# Patient Record
Sex: Female | Born: 1950
Health system: Southern US, Community
[De-identification: ages and names within clinical notes are randomized; demographics above are authoritative.]

## PROBLEM LIST (undated history)

## (undated) DIAGNOSIS — R634 Abnormal weight loss: Secondary | ICD-10-CM

## (undated) DIAGNOSIS — R0602 Shortness of breath: Secondary | ICD-10-CM

## (undated) DIAGNOSIS — I2510 Atherosclerotic heart disease of native coronary artery without angina pectoris: Secondary | ICD-10-CM

## (undated) DIAGNOSIS — K279 Peptic ulcer, site unspecified, unspecified as acute or chronic, without hemorrhage or perforation: Secondary | ICD-10-CM

## (undated) DIAGNOSIS — R112 Nausea with vomiting, unspecified: Secondary | ICD-10-CM

## (undated) DIAGNOSIS — E785 Hyperlipidemia, unspecified: Secondary | ICD-10-CM

## (undated) DIAGNOSIS — R9430 Abnormal result of cardiovascular function study, unspecified: Secondary | ICD-10-CM

## (undated) DIAGNOSIS — K449 Diaphragmatic hernia without obstruction or gangrene: Secondary | ICD-10-CM

## (undated) DIAGNOSIS — Z90710 Acquired absence of both cervix and uterus: Secondary | ICD-10-CM

## (undated) DIAGNOSIS — J8410 Pulmonary fibrosis, unspecified: Secondary | ICD-10-CM

## (undated) DIAGNOSIS — I10 Essential (primary) hypertension: Secondary | ICD-10-CM

## (undated) DIAGNOSIS — K635 Polyp of colon: Secondary | ICD-10-CM

## (undated) DIAGNOSIS — K219 Gastro-esophageal reflux disease without esophagitis: Secondary | ICD-10-CM

## (undated) DIAGNOSIS — R001 Bradycardia, unspecified: Secondary | ICD-10-CM

## (undated) DIAGNOSIS — R002 Palpitations: Secondary | ICD-10-CM

## (undated) DIAGNOSIS — Z21 Asymptomatic human immunodeficiency virus [HIV] infection status: Secondary | ICD-10-CM

## (undated) HISTORY — DX: Atherosclerotic heart disease of native coronary artery without angina pectoris: I25.10

## (undated) HISTORY — DX: Polyp of colon: K63.5

## (undated) HISTORY — DX: Acquired absence of both cervix and uterus: Z90.710

## (undated) HISTORY — DX: Asymptomatic human immunodeficiency virus (hiv) infection status: Z21

## (undated) HISTORY — DX: Shortness of breath: R06.02

## (undated) HISTORY — DX: Palpitations: R00.2

## (undated) HISTORY — PX: VENTRAL HERNIA REPAIR: SHX424

## (undated) HISTORY — PX: GASTRECTOMY: SHX58

## (undated) HISTORY — DX: Abnormal result of cardiovascular function study, unspecified: R94.30

## (undated) HISTORY — PX: CARDIAC CATHETERIZATION: SHX172

## (undated) HISTORY — DX: Hyperlipidemia, unspecified: E78.5

## (undated) HISTORY — DX: Abnormal weight loss: R63.4

## (undated) HISTORY — DX: Bradycardia, unspecified: R00.1

## (undated) HISTORY — DX: Peptic ulcer, site unspecified, unspecified as acute or chronic, without hemorrhage or perforation: K27.9

## (undated) HISTORY — PX: CHOLECYSTECTOMY: SHX55

## (undated) HISTORY — PX: CORONARY STENT PLACEMENT: SHX1402

## (undated) HISTORY — DX: Gastro-esophageal reflux disease without esophagitis: K21.9

## (undated) HISTORY — PX: APPENDECTOMY: SHX54

## (undated) HISTORY — PX: ABDOMINAL HYSTERECTOMY: SHX81

---
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
## 1996-12-18 ENCOUNTER — Encounter: Payer: Self-pay | Admitting: Internal Medicine

## 1997-07-21 ENCOUNTER — Other Ambulatory Visit: Admission: RE | Admit: 1997-07-21 | Discharge: 1997-07-21 | Payer: Self-pay | Admitting: Family Medicine

## 1997-07-22 ENCOUNTER — Other Ambulatory Visit: Admission: RE | Admit: 1997-07-22 | Discharge: 1997-07-22 | Payer: Self-pay | Admitting: Family Medicine

## 1997-07-23 ENCOUNTER — Other Ambulatory Visit: Admission: RE | Admit: 1997-07-23 | Discharge: 1997-07-23 | Payer: Self-pay | Admitting: Obstetrics

## 1997-07-23 ENCOUNTER — Encounter: Admission: RE | Admit: 1997-07-23 | Discharge: 1997-07-23 | Payer: Self-pay | Admitting: Obstetrics

## 1997-07-24 ENCOUNTER — Ambulatory Visit: Admission: RE | Admit: 1997-07-24 | Discharge: 1997-07-24 | Payer: Self-pay | Admitting: Family Medicine

## 1997-08-17 ENCOUNTER — Ambulatory Visit (HOSPITAL_COMMUNITY): Admission: RE | Admit: 1997-08-17 | Discharge: 1997-08-17 | Payer: Self-pay | Admitting: Family Medicine

## 1997-09-22 ENCOUNTER — Other Ambulatory Visit: Admission: RE | Admit: 1997-09-22 | Discharge: 1997-09-22 | Payer: Self-pay | Admitting: Family Medicine

## 1997-11-24 ENCOUNTER — Encounter: Payer: Self-pay | Admitting: Family Medicine

## 1997-11-24 ENCOUNTER — Ambulatory Visit (HOSPITAL_COMMUNITY): Admission: RE | Admit: 1997-11-24 | Discharge: 1997-11-24 | Payer: Self-pay | Admitting: Family Medicine

## 1997-11-24 ENCOUNTER — Other Ambulatory Visit: Admission: RE | Admit: 1997-11-24 | Discharge: 1997-11-24 | Payer: Self-pay | Admitting: Family Medicine

## 1998-01-29 ENCOUNTER — Other Ambulatory Visit: Admission: RE | Admit: 1998-01-29 | Discharge: 1998-01-29 | Payer: Self-pay | Admitting: Obstetrics and Gynecology

## 1998-02-18 ENCOUNTER — Ambulatory Visit (HOSPITAL_COMMUNITY): Admission: RE | Admit: 1998-02-18 | Discharge: 1998-02-18 | Payer: Self-pay | Admitting: Obstetrics and Gynecology

## 1998-05-20 ENCOUNTER — Ambulatory Visit (HOSPITAL_COMMUNITY): Admission: RE | Admit: 1998-05-20 | Discharge: 1998-05-20 | Payer: Self-pay | Admitting: Family Medicine

## 1999-04-27 ENCOUNTER — Ambulatory Visit (HOSPITAL_COMMUNITY): Admission: RE | Admit: 1999-04-27 | Discharge: 1999-04-27 | Payer: Self-pay | Admitting: Surgery

## 1999-04-27 ENCOUNTER — Encounter: Payer: Self-pay | Admitting: Surgery

## 1999-05-19 ENCOUNTER — Inpatient Hospital Stay (HOSPITAL_COMMUNITY): Admission: EM | Admit: 1999-05-19 | Discharge: 1999-05-22 | Payer: Self-pay | Admitting: Emergency Medicine

## 1999-05-19 ENCOUNTER — Encounter: Payer: Self-pay | Admitting: Emergency Medicine

## 1999-05-21 ENCOUNTER — Encounter: Payer: Self-pay | Admitting: Internal Medicine

## 1999-06-21 ENCOUNTER — Ambulatory Visit (HOSPITAL_COMMUNITY): Admission: RE | Admit: 1999-06-21 | Discharge: 1999-06-21 | Payer: Self-pay | Admitting: Orthopaedic Surgery

## 1999-09-16 ENCOUNTER — Ambulatory Visit (HOSPITAL_COMMUNITY): Admission: RE | Admit: 1999-09-16 | Discharge: 1999-09-16 | Payer: Self-pay | Admitting: *Deleted

## 2000-02-28 ENCOUNTER — Ambulatory Visit (HOSPITAL_COMMUNITY): Admission: RE | Admit: 2000-02-28 | Discharge: 2000-02-28 | Payer: Self-pay | Admitting: Family Medicine

## 2000-02-28 ENCOUNTER — Encounter: Payer: Self-pay | Admitting: Family Medicine

## 2000-06-22 ENCOUNTER — Ambulatory Visit (HOSPITAL_COMMUNITY): Admission: RE | Admit: 2000-06-22 | Discharge: 2000-06-22 | Payer: Self-pay | Admitting: Family Medicine

## 2000-06-22 ENCOUNTER — Encounter: Payer: Self-pay | Admitting: Family Medicine

## 2000-08-14 ENCOUNTER — Encounter: Payer: Self-pay | Admitting: Family Medicine

## 2000-08-14 ENCOUNTER — Ambulatory Visit (HOSPITAL_COMMUNITY): Admission: RE | Admit: 2000-08-14 | Discharge: 2000-08-14 | Payer: Self-pay | Admitting: Family Medicine

## 2000-11-20 ENCOUNTER — Ambulatory Visit (HOSPITAL_COMMUNITY): Admission: RE | Admit: 2000-11-20 | Discharge: 2000-11-20 | Payer: Self-pay | Admitting: Internal Medicine

## 2000-11-20 ENCOUNTER — Encounter: Payer: Self-pay | Admitting: Internal Medicine

## 2000-12-03 ENCOUNTER — Encounter: Payer: Self-pay | Admitting: Family Medicine

## 2000-12-03 ENCOUNTER — Ambulatory Visit (HOSPITAL_COMMUNITY): Admission: RE | Admit: 2000-12-03 | Discharge: 2000-12-03 | Payer: Self-pay | Admitting: Family Medicine

## 2001-01-07 ENCOUNTER — Encounter: Payer: Self-pay | Admitting: Internal Medicine

## 2001-01-07 ENCOUNTER — Encounter: Admission: RE | Admit: 2001-01-07 | Discharge: 2001-01-07 | Payer: Self-pay | Admitting: Internal Medicine

## 2001-03-05 ENCOUNTER — Encounter: Payer: Self-pay | Admitting: Internal Medicine

## 2001-03-05 ENCOUNTER — Ambulatory Visit (HOSPITAL_COMMUNITY): Admission: RE | Admit: 2001-03-05 | Discharge: 2001-03-05 | Payer: Self-pay | Admitting: Internal Medicine

## 2001-06-10 ENCOUNTER — Encounter (INDEPENDENT_AMBULATORY_CARE_PROVIDER_SITE_OTHER): Payer: Self-pay | Admitting: Specialist

## 2001-06-10 ENCOUNTER — Ambulatory Visit (HOSPITAL_COMMUNITY): Admission: RE | Admit: 2001-06-10 | Discharge: 2001-06-10 | Payer: Self-pay | Admitting: Internal Medicine

## 2001-06-10 ENCOUNTER — Encounter: Payer: Self-pay | Admitting: Internal Medicine

## 2001-06-24 ENCOUNTER — Ambulatory Visit (HOSPITAL_COMMUNITY): Admission: RE | Admit: 2001-06-24 | Discharge: 2001-06-24 | Payer: Self-pay | Admitting: Family Medicine

## 2001-07-26 ENCOUNTER — Ambulatory Visit (HOSPITAL_COMMUNITY): Admission: RE | Admit: 2001-07-26 | Discharge: 2001-07-26 | Payer: Self-pay | Admitting: Internal Medicine

## 2001-07-26 ENCOUNTER — Encounter: Payer: Self-pay | Admitting: Internal Medicine

## 2001-08-11 ENCOUNTER — Encounter: Payer: Self-pay | Admitting: Emergency Medicine

## 2001-08-11 ENCOUNTER — Inpatient Hospital Stay (HOSPITAL_COMMUNITY): Admission: EM | Admit: 2001-08-11 | Discharge: 2001-08-13 | Payer: Self-pay | Admitting: Emergency Medicine

## 2001-11-07 ENCOUNTER — Encounter: Payer: Self-pay | Admitting: Orthopaedic Surgery

## 2001-11-07 ENCOUNTER — Encounter: Admission: RE | Admit: 2001-11-07 | Discharge: 2001-11-07 | Payer: Self-pay | Admitting: Orthopaedic Surgery

## 2001-11-21 ENCOUNTER — Encounter: Admission: RE | Admit: 2001-11-21 | Discharge: 2001-11-21 | Payer: Self-pay | Admitting: Orthopaedic Surgery

## 2001-11-21 ENCOUNTER — Encounter: Payer: Self-pay | Admitting: Orthopaedic Surgery

## 2001-12-17 ENCOUNTER — Inpatient Hospital Stay (HOSPITAL_COMMUNITY): Admission: EM | Admit: 2001-12-17 | Discharge: 2001-12-20 | Payer: Self-pay

## 2001-12-18 ENCOUNTER — Encounter: Payer: Self-pay | Admitting: Cardiovascular Disease

## 2002-01-09 ENCOUNTER — Emergency Department (HOSPITAL_COMMUNITY): Admission: EM | Admit: 2002-01-09 | Discharge: 2002-01-09 | Payer: Self-pay | Admitting: Emergency Medicine

## 2002-01-09 ENCOUNTER — Encounter: Payer: Self-pay | Admitting: Emergency Medicine

## 2002-02-24 ENCOUNTER — Ambulatory Visit (HOSPITAL_COMMUNITY): Admission: RE | Admit: 2002-02-24 | Discharge: 2002-02-24 | Payer: Self-pay | Admitting: Internal Medicine

## 2002-03-24 ENCOUNTER — Encounter: Payer: Self-pay | Admitting: Orthopaedic Surgery

## 2002-03-24 ENCOUNTER — Encounter: Admission: RE | Admit: 2002-03-24 | Discharge: 2002-03-24 | Payer: Self-pay | Admitting: Orthopaedic Surgery

## 2002-03-30 ENCOUNTER — Encounter: Payer: Self-pay | Admitting: Emergency Medicine

## 2002-03-30 ENCOUNTER — Inpatient Hospital Stay (HOSPITAL_COMMUNITY): Admission: EM | Admit: 2002-03-30 | Discharge: 2002-04-03 | Payer: Self-pay | Admitting: Emergency Medicine

## 2002-05-16 ENCOUNTER — Ambulatory Visit (HOSPITAL_COMMUNITY): Admission: RE | Admit: 2002-05-16 | Discharge: 2002-05-16 | Payer: Self-pay | Admitting: Internal Medicine

## 2002-05-16 ENCOUNTER — Encounter: Payer: Self-pay | Admitting: Internal Medicine

## 2002-07-24 ENCOUNTER — Ambulatory Visit (HOSPITAL_COMMUNITY): Admission: RE | Admit: 2002-07-24 | Discharge: 2002-07-24 | Payer: Self-pay | Admitting: Internal Medicine

## 2003-04-07 ENCOUNTER — Ambulatory Visit (HOSPITAL_COMMUNITY): Admission: RE | Admit: 2003-04-07 | Discharge: 2003-04-07 | Payer: Self-pay | Admitting: Obstetrics and Gynecology

## 2003-05-05 ENCOUNTER — Ambulatory Visit (HOSPITAL_COMMUNITY): Admission: RE | Admit: 2003-05-05 | Discharge: 2003-05-05 | Payer: Self-pay | Admitting: Internal Medicine

## 2003-07-27 ENCOUNTER — Ambulatory Visit (HOSPITAL_COMMUNITY): Admission: RE | Admit: 2003-07-27 | Discharge: 2003-07-27 | Payer: Self-pay | Admitting: Internal Medicine

## 2003-10-23 ENCOUNTER — Inpatient Hospital Stay (HOSPITAL_COMMUNITY): Admission: EM | Admit: 2003-10-23 | Discharge: 2003-10-26 | Payer: Self-pay | Admitting: Emergency Medicine

## 2003-10-24 ENCOUNTER — Encounter: Payer: Self-pay | Admitting: Internal Medicine

## 2003-10-24 ENCOUNTER — Encounter (INDEPENDENT_AMBULATORY_CARE_PROVIDER_SITE_OTHER): Payer: Self-pay | Admitting: Specialist

## 2004-01-07 ENCOUNTER — Ambulatory Visit: Payer: Self-pay | Admitting: Family Medicine

## 2004-01-19 ENCOUNTER — Ambulatory Visit: Payer: Self-pay | Admitting: Family Medicine

## 2004-03-12 ENCOUNTER — Emergency Department (HOSPITAL_COMMUNITY): Admission: EM | Admit: 2004-03-12 | Discharge: 2004-03-12 | Payer: Self-pay | Admitting: *Deleted

## 2004-03-17 ENCOUNTER — Ambulatory Visit: Payer: Self-pay | Admitting: Family Medicine

## 2004-03-29 ENCOUNTER — Ambulatory Visit: Payer: Self-pay | Admitting: Family Medicine

## 2004-04-04 ENCOUNTER — Ambulatory Visit: Payer: Self-pay | Admitting: Family Medicine

## 2004-04-05 ENCOUNTER — Ambulatory Visit (HOSPITAL_COMMUNITY): Admission: RE | Admit: 2004-04-05 | Discharge: 2004-04-05 | Payer: Self-pay | Admitting: Internal Medicine

## 2004-04-06 ENCOUNTER — Ambulatory Visit: Payer: Self-pay | Admitting: Family Medicine

## 2004-04-14 ENCOUNTER — Ambulatory Visit: Payer: Self-pay | Admitting: Family Medicine

## 2004-06-16 ENCOUNTER — Ambulatory Visit: Payer: Self-pay | Admitting: Family Medicine

## 2004-06-17 ENCOUNTER — Ambulatory Visit (HOSPITAL_COMMUNITY): Admission: RE | Admit: 2004-06-17 | Discharge: 2004-06-17 | Payer: Self-pay | Admitting: Internal Medicine

## 2004-07-29 ENCOUNTER — Ambulatory Visit (HOSPITAL_COMMUNITY): Admission: RE | Admit: 2004-07-29 | Discharge: 2004-07-29 | Payer: Self-pay | Admitting: Family Medicine

## 2004-08-23 ENCOUNTER — Ambulatory Visit: Payer: Self-pay | Admitting: Family Medicine

## 2004-09-01 ENCOUNTER — Ambulatory Visit: Payer: Self-pay | Admitting: Family Medicine

## 2004-11-24 ENCOUNTER — Ambulatory Visit: Payer: Self-pay | Admitting: Family Medicine

## 2004-11-29 ENCOUNTER — Ambulatory Visit: Payer: Self-pay | Admitting: Nurse Practitioner

## 2004-12-09 ENCOUNTER — Ambulatory Visit (HOSPITAL_COMMUNITY): Admission: RE | Admit: 2004-12-09 | Discharge: 2004-12-09 | Payer: Self-pay | Admitting: Internal Medicine

## 2004-12-09 ENCOUNTER — Ambulatory Visit: Payer: Self-pay | Admitting: Family Medicine

## 2004-12-12 ENCOUNTER — Ambulatory Visit: Payer: Self-pay | Admitting: Family Medicine

## 2005-01-06 ENCOUNTER — Ambulatory Visit: Payer: Self-pay | Admitting: Family Medicine

## 2005-02-28 ENCOUNTER — Ambulatory Visit: Payer: Self-pay | Admitting: Family Medicine

## 2005-03-09 ENCOUNTER — Ambulatory Visit: Payer: Self-pay | Admitting: Family Medicine

## 2005-03-15 ENCOUNTER — Ambulatory Visit: Payer: Self-pay | Admitting: Family Medicine

## 2005-03-15 ENCOUNTER — Emergency Department (HOSPITAL_COMMUNITY): Admission: EM | Admit: 2005-03-15 | Discharge: 2005-03-15 | Payer: Self-pay | Admitting: Emergency Medicine

## 2005-03-16 ENCOUNTER — Emergency Department (HOSPITAL_COMMUNITY): Admission: EM | Admit: 2005-03-16 | Discharge: 2005-03-16 | Payer: Self-pay | Admitting: Emergency Medicine

## 2005-05-04 ENCOUNTER — Ambulatory Visit: Payer: Self-pay | Admitting: Family Medicine

## 2005-05-11 ENCOUNTER — Ambulatory Visit: Payer: Self-pay | Admitting: Internal Medicine

## 2005-07-24 ENCOUNTER — Ambulatory Visit: Payer: Self-pay | Admitting: Family Medicine

## 2005-07-31 ENCOUNTER — Ambulatory Visit (HOSPITAL_COMMUNITY): Admission: RE | Admit: 2005-07-31 | Discharge: 2005-07-31 | Payer: Self-pay | Admitting: Obstetrics and Gynecology

## 2005-08-09 ENCOUNTER — Ambulatory Visit: Payer: Self-pay | Admitting: Internal Medicine

## 2005-08-28 ENCOUNTER — Ambulatory Visit: Payer: Self-pay | Admitting: Internal Medicine

## 2005-10-19 ENCOUNTER — Ambulatory Visit: Payer: Self-pay | Admitting: Cardiology

## 2005-10-19 ENCOUNTER — Inpatient Hospital Stay (HOSPITAL_COMMUNITY): Admission: EM | Admit: 2005-10-19 | Discharge: 2005-10-20 | Payer: Self-pay | Admitting: Emergency Medicine

## 2005-10-19 ENCOUNTER — Ambulatory Visit: Payer: Self-pay | Admitting: Family Medicine

## 2005-10-26 ENCOUNTER — Ambulatory Visit: Payer: Self-pay | Admitting: Family Medicine

## 2005-11-06 ENCOUNTER — Ambulatory Visit: Payer: Self-pay | Admitting: Cardiology

## 2005-11-27 ENCOUNTER — Ambulatory Visit: Payer: Self-pay | Admitting: Cardiology

## 2005-12-07 ENCOUNTER — Ambulatory Visit: Payer: Self-pay | Admitting: Cardiology

## 2005-12-14 ENCOUNTER — Ambulatory Visit: Payer: Self-pay | Admitting: Family Medicine

## 2006-01-02 ENCOUNTER — Ambulatory Visit: Payer: Self-pay | Admitting: Internal Medicine

## 2006-01-04 ENCOUNTER — Ambulatory Visit: Payer: Self-pay | Admitting: Family Medicine

## 2006-01-05 ENCOUNTER — Ambulatory Visit: Payer: Self-pay | Admitting: Cardiology

## 2006-03-23 ENCOUNTER — Ambulatory Visit: Payer: Self-pay | Admitting: Internal Medicine

## 2006-03-26 ENCOUNTER — Ambulatory Visit (HOSPITAL_COMMUNITY): Admission: RE | Admit: 2006-03-26 | Discharge: 2006-03-26 | Payer: Self-pay | Admitting: Internal Medicine

## 2006-04-03 ENCOUNTER — Ambulatory Visit: Payer: Self-pay | Admitting: Internal Medicine

## 2006-06-21 ENCOUNTER — Ambulatory Visit: Payer: Self-pay | Admitting: Cardiology

## 2006-07-19 ENCOUNTER — Emergency Department (HOSPITAL_COMMUNITY): Admission: EM | Admit: 2006-07-19 | Discharge: 2006-07-19 | Payer: Self-pay | Admitting: Emergency Medicine

## 2006-07-24 ENCOUNTER — Ambulatory Visit: Payer: Self-pay | Admitting: Infectious Diseases

## 2006-07-24 ENCOUNTER — Inpatient Hospital Stay (HOSPITAL_COMMUNITY): Admission: EM | Admit: 2006-07-24 | Discharge: 2006-07-27 | Payer: Self-pay | Admitting: Emergency Medicine

## 2006-08-28 ENCOUNTER — Ambulatory Visit: Payer: Self-pay | Admitting: Internal Medicine

## 2006-11-07 ENCOUNTER — Ambulatory Visit: Payer: Self-pay | Admitting: Internal Medicine

## 2006-11-07 LAB — CONVERTED CEMR LAB
Alkaline Phosphatase: 59 units/L (ref 39–117)
Basophils Absolute: 0 10*3/uL (ref 0.0–0.1)
Basophils Relative: 1 % (ref 0–1)
Calcium: 8.8 mg/dL (ref 8.4–10.5)
Eosinophils Absolute: 0.1 10*3/uL (ref 0.0–0.7)
Glucose, Bld: 92 mg/dL (ref 70–99)
HCT: 38.9 % (ref 36.0–46.0)
HIV-1 RNA Quant, Log: 1.92 — ABNORMAL HIGH (ref <1.70)
Lymphs Abs: 1.6 10*3/uL (ref 0.7–3.3)
MCHC: 33.2 g/dL (ref 30.0–36.0)
MCV: 91.1 fL (ref 78.0–100.0)
Monocytes Relative: 15 % — ABNORMAL HIGH (ref 3–11)
Neutrophils Relative %: 49 % (ref 43–77)
Potassium: 3.6 meq/L (ref 3.5–5.3)
Sodium: 143 meq/L (ref 135–145)
Total Bilirubin: 0.3 mg/dL (ref 0.3–1.2)

## 2006-12-28 ENCOUNTER — Ambulatory Visit: Payer: Self-pay | Admitting: Cardiology

## 2007-01-02 ENCOUNTER — Ambulatory Visit: Payer: Self-pay | Admitting: Cardiology

## 2007-01-25 ENCOUNTER — Ambulatory Visit: Payer: Self-pay | Admitting: Cardiology

## 2007-02-15 ENCOUNTER — Ambulatory Visit: Payer: Self-pay | Admitting: Cardiology

## 2007-07-22 ENCOUNTER — Ambulatory Visit: Payer: Self-pay | Admitting: Internal Medicine

## 2007-07-22 ENCOUNTER — Telehealth (INDEPENDENT_AMBULATORY_CARE_PROVIDER_SITE_OTHER): Payer: Self-pay | Admitting: *Deleted

## 2007-07-22 DIAGNOSIS — K5900 Constipation, unspecified: Secondary | ICD-10-CM | POA: Insufficient documentation

## 2007-07-22 DIAGNOSIS — R634 Abnormal weight loss: Secondary | ICD-10-CM

## 2007-07-22 DIAGNOSIS — R1084 Generalized abdominal pain: Secondary | ICD-10-CM

## 2007-07-22 LAB — CONVERTED CEMR LAB
BUN: 13 mg/dL (ref 6–23)
Creatinine, Ser: 0.7 mg/dL (ref 0.4–1.2)

## 2007-07-23 ENCOUNTER — Ambulatory Visit: Payer: Self-pay | Admitting: Cardiology

## 2007-07-24 ENCOUNTER — Telehealth: Payer: Self-pay | Admitting: Internal Medicine

## 2007-08-06 ENCOUNTER — Encounter: Payer: Self-pay | Admitting: Internal Medicine

## 2007-08-06 ENCOUNTER — Ambulatory Visit (HOSPITAL_COMMUNITY): Admission: RE | Admit: 2007-08-06 | Discharge: 2007-08-06 | Payer: Self-pay | Admitting: Internal Medicine

## 2007-08-13 ENCOUNTER — Ambulatory Visit: Payer: Self-pay | Admitting: Internal Medicine

## 2007-08-22 ENCOUNTER — Ambulatory Visit: Payer: Self-pay | Admitting: Cardiology

## 2007-09-19 ENCOUNTER — Emergency Department (HOSPITAL_COMMUNITY): Admission: EM | Admit: 2007-09-19 | Discharge: 2007-09-19 | Payer: Self-pay | Admitting: Emergency Medicine

## 2007-12-11 ENCOUNTER — Ambulatory Visit: Payer: Self-pay | Admitting: Cardiology

## 2008-01-02 ENCOUNTER — Ambulatory Visit: Payer: Self-pay | Admitting: Cardiology

## 2008-04-09 ENCOUNTER — Emergency Department (HOSPITAL_COMMUNITY): Admission: EM | Admit: 2008-04-09 | Discharge: 2008-04-09 | Payer: Self-pay | Admitting: Emergency Medicine

## 2008-06-22 ENCOUNTER — Encounter: Payer: Self-pay | Admitting: Internal Medicine

## 2008-07-06 ENCOUNTER — Ambulatory Visit: Payer: Self-pay | Admitting: Cardiology

## 2008-07-06 DIAGNOSIS — J8410 Pulmonary fibrosis, unspecified: Secondary | ICD-10-CM

## 2008-09-10 ENCOUNTER — Encounter: Payer: Self-pay | Admitting: Internal Medicine

## 2008-09-23 ENCOUNTER — Encounter: Payer: Self-pay | Admitting: Internal Medicine

## 2008-11-03 ENCOUNTER — Encounter: Payer: Self-pay | Admitting: Cardiology

## 2008-12-10 ENCOUNTER — Encounter: Payer: Self-pay | Admitting: Cardiology

## 2008-12-14 ENCOUNTER — Ambulatory Visit: Payer: Self-pay | Admitting: Cardiology

## 2009-05-04 ENCOUNTER — Encounter: Payer: Self-pay | Admitting: Cardiology

## 2009-05-05 ENCOUNTER — Encounter: Payer: Self-pay | Admitting: Cardiology

## 2009-07-29 ENCOUNTER — Encounter (INDEPENDENT_AMBULATORY_CARE_PROVIDER_SITE_OTHER): Payer: Self-pay | Admitting: *Deleted

## 2009-07-29 ENCOUNTER — Emergency Department (HOSPITAL_COMMUNITY): Admission: EM | Admit: 2009-07-29 | Discharge: 2009-07-29 | Payer: Self-pay | Admitting: Emergency Medicine

## 2009-07-30 ENCOUNTER — Telehealth: Payer: Self-pay | Admitting: Internal Medicine

## 2009-12-16 ENCOUNTER — Ambulatory Visit: Payer: Self-pay | Admitting: Cardiology

## 2010-04-03 ENCOUNTER — Encounter: Payer: Self-pay | Admitting: Obstetrics and Gynecology

## 2010-04-14 NOTE — Progress Notes (Signed)
Summary: Triage   Phone Note Call from Patient Call back at Home Phone [phone]   Caller: Patient Call For: Dr. Marina Goodell Reason for Call: Talk to Nurse Summary of Call: pt. went to ER yesterday and been vomiting x4 days. Initial call taken by: Karna Christmas,  Jul 30, 2009 12:56 PM     Appended Document: Triage  Pt. says the Er. Dr. told her she needs seen by someone here as her appt.  with PCP at [facility] is not until Tuesday.   Appended Document: Triage Why? What was the ER's dx? Is there anyone here even available to see her this late on Fri? If not, and she is still having problems, she needs to call PCP, or go to urgent care or ER.  Appended Document: Triage There is no one in office this afternoon. Please read the ER. notes and Ct that is in there for 07/29/09  Appended Document: Triage Pt.aware that she is under tx. for  symptoms prescibed by Er Dr. and she needs to keep appt. at [facility] on Tuesday..If problem over the week end she needs to go to th ER.

## 2010-04-14 NOTE — Assessment & Plan Note (Signed)
Summary: f1y  Medications Added OMEPRAZOLE 20 MG CPDR (OMEPRAZOLE) Take 1 tablet by mouth two times a day SYMBICORT 160-4.5 MCG/ACT AERO (BUDESONIDE-FORMOTEROL FUMARATE) three times a day      Allergies Added:   Visit Type:  Follow-up Referring Provider:  Eliseo Gum, MD  [facility] pulmon Primary Provider:  Hurshel Keys  CC:  chest pain.  History of Present Illness: The patient is seen for audiology followup.  She's had some sinus bradycardia in the past.  He also had chest pain.  There is a question of coronary spasm in the past.  We know she has only mild coronary artery disease.  She also has some palpitations.  Her pulmonary disease is still being evaluated at [facility].  She does have shortness of breath.  Current Medications (verified): 1)  Norvir 100 Mg  Caps (Ritonavir) .... Once Daily 2)  Truvada 200-300 Mg  Tabs (Emtricitabine-Tenofovir) .... Once Daily 3)  Reyataz 300 Mg  Caps (Atazanavir Sulfate) .... Once Daily 4)  Pravachol 80 Mg  Tabs (Pravastatin Sodium) .... Once Daily 5)  Tricor 145 Mg  Tabs (Fenofibrate) .... Once Daily 6)  Amitriptyline Hcl 50 Mg  Tabs (Amitriptyline Hcl) .... Once Daily 7)  Dapsone 25 Mg  Tabs (Dapsone) .... Once Daily 8)  Amitriptyline Hcl 50 Mg Tabs (Amitriptyline Hcl) .Marland Kitchen.. 1 Once Daily 9)  Ventolin Hfa 108 (90 Base) Mcg/act Aers (Albuterol Sulfate) .... Uad 10)  Aspirin 81 Mg  Tbec (Aspirin) .... One By Mouth Every Day 11)  Omeprazole 20 Mg Cpdr (Omeprazole) .... Take 1 Tablet By Mouth Two Times A Day 12)  Symbicort 160-4.5 Mcg/act Aero (Budesonide-Formoterol Fumarate) .... Three Times A Day  Allergies (verified): 1)  ! Bactrim  Past History:  Past Medical History: HIV (ICD-042) -- needle stick in 1993 CORONARY ARTERY DISEASE (ICD-414.00...)minimal by history PALPITATIONS (ICD-785.1) HYPERLIPIDEMIA (ICD-272.4) WEIGHT LOSS-ABNORMAL (ICD-783.21) CONSTIPATION (ICD-564.00) ABDOMINAL PAIN-GENERALIZED (ICD-789.07) ASTHMA  (ICD-493.90) Hx of PEPTIC ULCER DISEASE (ICD-V12.71) GERD (ICD-530.81) ESOPHAGEAL STRICTURE (ICD-530.3) ADENOMATOUS COLON POLYPS (ICD-211.3) Question coronary artery spasm Sinus bradycardia Pulmonary problem with SOB assessed at [facility] onto [facility] extensively. 08/06/2008 note says etiolgy uncertain, but probable bronchiolitis.Marland Kitchenneeds lung biopsy Eliseo Gum is pulmonary doctor)  Review of Systems       Patient denies fever, chills, headache, sweats, rash, change in vision, change in hearing, chest pain, cough, nausea vomiting, urinary symptoms.  All other systems are reviewed and are negative.  Vital Signs:  Patient profile:   60 year old female Height:      60 inches Weight:      144 pounds BMI:     28.22 Pulse rate:   62 / minute BP sitting:   106 / 68  (left arm) Cuff size:   regular  Vitals Entered By: Hardin Negus, RMA (December 16, 2009 10:09 AM)  Physical Exam  General:  Patient is stable today. Eyes:  no xanthelasma. Neck:  no jugular venous distention. Lungs:  lungs are clear.  Respiratory effort is nonlabored. Heart:  cardiac exam reveals S1-S2.  No clicks or significant murmurs. Abdomen:  abdomen is soft. Extremities:  no peripheral edema. Psych:  patient is oriented to person time and place.  Affect is normal.   Impression & Recommendations:  Problem # 1:  PULMONARY FIBROSIS (ICD-515) For pulmonary evaluation is still ongoing at [facility].  Problem # 2:  * QUESTION CORONARY SPASM There is been question of coronary spasm.  She is not having significant symptoms at this time.  Problem # 3:  CORONARY ARTERY DISEASE (ICD-414.00)  Her updated medication list for this problem includes:    Aspirin 81 Mg Tbec (Aspirin) ..... One by mouth every day  Orders: EKG w/ Interpretation (93000) There is history of mild coronary artery disease EKG is done today and reviewed by me.  There is no significant change.  Cardiac status is stable.  No further workup  needed.  Follow up one year.  Patient Instructions: 1)  Your physician recommends that you schedule a follow-up appointment in: 1 year 2)  Your physician recommends that you continue on your current medications as directed. Please refer to the Current Medication list given to you today.

## 2010-04-14 NOTE — Letter (Signed)
Summary: [facility] Medical Clinic Note  [facility] Medical Clinic Note   Imported By: Roderic Ovens 06/03/2009 14:07:05  _____________________________________________________________________  External Attachment:    Type:   Image     Comment:   External Document

## 2010-04-14 NOTE — Letter (Signed)
Summary: [facility] Medical Clinic Note  [facility] Medical Clinic Note   Imported By: Roderic Ovens 06/03/2009 14:06:27  _____________________________________________________________________  External Attachment:    Type:   Image     Comment:   External Document

## 2010-04-28 ENCOUNTER — Telehealth (INDEPENDENT_AMBULATORY_CARE_PROVIDER_SITE_OTHER): Payer: Self-pay | Admitting: *Deleted

## 2010-05-04 NOTE — Progress Notes (Signed)
  Phone Note Other Incoming   Request: Send information Summary of Call: Request for records received from [facility]. Request forwarded to [facility].  All records for coordination of care.

## 2010-05-06 ENCOUNTER — Emergency Department (HOSPITAL_COMMUNITY): Payer: Worker's Compensation

## 2010-05-06 ENCOUNTER — Emergency Department (HOSPITAL_COMMUNITY)
Admission: EM | Admit: 2010-05-06 | Discharge: 2010-05-06 | Disposition: A | Discharge status: Home / Self Care | Payer: Worker's Compensation | Attending: Emergency Medicine | Admitting: Emergency Medicine

## 2010-05-06 DIAGNOSIS — E7800 Pure hypercholesterolemia, unspecified: Secondary | ICD-10-CM | POA: Insufficient documentation

## 2010-05-06 DIAGNOSIS — W230XXA Caught, crushed, jammed, or pinched between moving objects, initial encounter: Secondary | ICD-10-CM | POA: Insufficient documentation

## 2010-05-06 DIAGNOSIS — J45909 Unspecified asthma, uncomplicated: Secondary | ICD-10-CM | POA: Insufficient documentation

## 2010-05-06 DIAGNOSIS — S61209A Unspecified open wound of unspecified finger without damage to nail, initial encounter: Secondary | ICD-10-CM | POA: Insufficient documentation

## 2010-05-06 DIAGNOSIS — Z21 Asymptomatic human immunodeficiency virus [HIV] infection status: Secondary | ICD-10-CM | POA: Insufficient documentation

## 2010-05-30 LAB — URINE CULTURE

## 2010-05-30 LAB — COMPREHENSIVE METABOLIC PANEL
ALT: 17 U/L (ref 0–35)
AST: 23 U/L (ref 0–37)
Albumin: 3.5 g/dL (ref 3.5–5.2)
BUN: 12 mg/dL (ref 6–23)
Chloride: 108 mEq/L (ref 96–112)
Creatinine, Ser: 0.66 mg/dL (ref 0.4–1.2)
GFR calc non Af Amer: >60 mL/min (ref >60)
Total Bilirubin: 1.4 mg/dL — ABNORMAL HIGH (ref 0.3–1.2)

## 2010-05-30 LAB — URINE MICROSCOPIC-ADD ON

## 2010-05-30 LAB — DIFFERENTIAL
Basophils Absolute: 0.1 10*3/uL (ref 0.0–0.1)
Basophils Relative: 1 % (ref 0–1)
Eosinophils Relative: 2 % (ref 0–5)
Lymphocytes Relative: 24 % (ref 12–46)
Lymphs Abs: 1.7 10*3/uL (ref 0.7–4.0)
Monocytes Relative: 12 % (ref 3–12)
Neutro Abs: 4.3 10*3/uL (ref 1.7–7.7)
Neutrophils Relative %: 61 % (ref 43–77)

## 2010-05-30 LAB — URINALYSIS, ROUTINE W REFLEX MICROSCOPIC
Bilirubin Urine: NEGATIVE
Glucose, UA: NEGATIVE mg/dL
Hgb urine dipstick: NEGATIVE
Nitrite: POSITIVE — AB
Protein, ur: NEGATIVE mg/dL
pH: 7 (ref 5.0–8.0)

## 2010-05-30 LAB — CBC
RBC: 4.41 MIL/uL (ref 3.87–5.11)
WBC: 7 10*3/uL (ref 4.0–10.5)

## 2010-06-27 LAB — CBC
Platelets: 185 10*3/uL (ref 150–400)
RDW: 12.9 % (ref 11.5–15.5)

## 2010-06-27 LAB — BASIC METABOLIC PANEL
BUN: 10 mg/dL (ref 6–23)
Calcium: 8.6 mg/dL (ref 8.4–10.5)
Creatinine, Ser: 0.78 mg/dL (ref 0.4–1.2)
GFR calc non Af Amer: >60 mL/min (ref >60)
Glucose, Bld: 86 mg/dL (ref 70–99)
Sodium: 142 mEq/L (ref 135–145)

## 2010-06-27 LAB — POCT CARDIAC MARKERS
CKMB, poc: <1 ng/mL — ABNORMAL LOW (ref 1.0–8.0)
Troponin i, poc: <0.05 ng/mL (ref 0.00–0.09)

## 2010-06-27 LAB — DIFFERENTIAL
Basophils Absolute: 0 10*3/uL (ref 0.0–0.1)
Basophils Relative: 0 % (ref 0–1)
Lymphocytes Relative: 24 % (ref 12–46)
Neutro Abs: 2.5 10*3/uL (ref 1.7–7.7)
Neutrophils Relative %: 61 % (ref 43–77)

## 2010-07-26 NOTE — Assessment & Plan Note (Signed)
[place] [facility]                            CARDIOLOGY OFFICE NOTE   NAME:[name], [name] MEARES                         MRN:          [id number]  DATE:12/11/2007                            DOB:          04/26/1950    HISTORY:  [name] [name] is seen for cardiology followup.  Historically,  there has been question of coronary spasm and there has been resting  sinus bradycardia and there has been question of supraventricular  tachycardia.  Over the time, I have carefully adjusted her medicines.  When she has had chest pain, we have used Norvasc and isosorbide for the  possibility of spasm.  We then changed to diltiazem to help with  possible spasm and possible supraventricular arrhythmias.  Some of these  changes appear to help her.  I have not used the beta-blocker because of  the question of spasm and because of her resting bradycardia.  More  recently, she feels fatigued and dizzy at times.  This seems to be  associated with bradycardia.  She has not had any true syncope.  She has  not had much chest pain recently.  She is doing about reasonable  activities.  She is being followed now by Internal Medicine and  Infectious Disease at the [facility].   She is not having any significant shortness of breath.   PAST MEDICAL HISTORY:   ALLERGIES:  No known drug allergies.   MEDICATIONS:  1. Amitriptyline 50.  2. Premarin 0.625.  3. Fexofenadine.  4. Pravachol.  5. TriCor.  6. Norvir.  7. Aspirin.  8. Diltiazem ER 180 (to be held).  9. Reyataz.  10.Truvada.  11.Claritin.   OTHER PAST MEDICAL HISTORY:  The patient is post appendectomy,  cholecystectomy, hysterectomy, and a ventral hernia repair.   REVIEW OF SYSTEMS:  Other than the HPI, the patient is not having any GI  symptoms.  She has no GU symptoms.  There is no fever.  She has no  significant skin rashes.  Otherwise, her review of systems is negative.   PHYSICAL  EXAMINATION:  VITAL SIGNS:  Blood pressure today is 98/64.  She  tends to run blood pressure on the low side, but this is lower than  usual.  Her heart rate today is 70.  At times, at other doctors offices,  she says that her heart rate has been in the range of 46-50.  GENERAL:  The patient is oriented to person, time, and place.  Affect is  normal.  HEENT:  No xanthelasma.  She has normal extraocular motion.  NECK:  There are no carotid bruits.  There is no jugular venous  distention.  LUNGS:  Clear.  Respiratory effort is not labored.  CARDIAC:  An S1 with an S2.  There are no clicks or significant murmurs.  ABDOMEN:  Soft.  She has no significant peripheral edema.   PROBLEMS:  1. History of colon polyps.  2. Status post appendectomy, cholecystectomy, hysterectomy, and      ventral hernia repair.  3. History of gastric polyp.  4.  History of GERD.  5. History of asthma.  6. Hyperlipidemia, treated.  7. History of human immunodeficiency virus since 1993 from a needle      stick.  She is undergoing treatment on a continuing basis for this.  8. Minimal coronary disease by cath.  As mentioned, she has rare chest      discomfort.  This has been stable.  9. Question of some type of coronary spasm in the past.  We had used      Norvasc and Imdur.  Then, I switched to diltiazem and Imdur, and      then I stopped the Imdur.  Because of bradycardia and relative      hypotension, I now need to stop her diltiazem.  Hopefully, she will      not have significant chest discomfort.  I will see her for early      followup to be sure about this.  10.History of palpitations.  We know that she has PAC and PVCs.      Diltiazem would help if she has rapid tachy arrhythmias.      Hopefully, her arrhythmias remained stable off the Cardizem.  11.Resting sinus bradycardia.  We talked today about whether or not      there is an indication for pacemaker because her family members      have had pacemaker.   Up to this point, there has been no evidence      that she requires this.   She will stop her diltiazem and I will see her for followup.     Luis Abed, MD, [facility]  Electronically Signed    JDK/MedQ  DD: 12/11/2007  DT: 12/12/2007  Job #: [id number]   cc:   @ [facility] Dr. Valerie Salts

## 2010-07-26 NOTE — Assessment & Plan Note (Signed)
[place] [facility]                            CARDIOLOGY OFFICE NOTE   NAME:[name], [name] THIEN                         MRN:          [id number]  DATE:02/15/2007                            DOB:          1950-09-25    [name] is doing better.  I had seen her on January 25, 2007, and we  decided to switch her Norvasc to Diltiazem Extended Release.  She has  had a definite decrease in her palpitations and she is feeling much  better.  She is not having any chest pain.  There has been no syncope or  presyncope.   PAST MEDICAL HISTORY:   ALLERGIES:  No known drug allergies.   MEDICATIONS:  Amitriptyline, Premarin, Fexofenadine, Sustiva, Lexiva,  Pravachol, Tricor, Norvir, aspirin, Isosorbide, and Diltiazem-ER 180.   OTHER MEDICAL PROBLEMS:  See the complete list from my note of December 28, 2006.   REVIEW OF SYSTEMS:  She feels great, and the review of systems is  negative.   PHYSICAL EXAMINATION:  VITAL SIGNS:  Blood pressure is 100/80.  Her  pulse rate is 62.  GENERAL:  The patient is oriented to person, time, and place.  Affect is  normal.  HEENT:  No xanthelasma.  She has normal extraocular motion.  NECK:  There are no carotid bruits.  There is no jugulovenous  distention.  CARDIAC:  An S1 with an S2.  There are no clicks or significant murmurs.  ABDOMEN:  Soft.  EXTREMITIES:  She has no peripheral edema.   No labs are done today.   Problems are listed on my note of December 28, 2006.  It would appear  that she must have been having some type of supraventricular  arrhythmias.  She feels much better on Diltiazem.  This medicine can be  used for spasm.  However, she was on Norvasc and Isosorbide before.  Therefore, I am not inclined to stop her Isosorbide now.  I will see her  back in six months.   IMPRESSION:     Luis Abed, MD, [facility]  Electronically Signed    JDK/MedQ  DD: 02/15/2007  DT: 02/15/2007  Job #: [id number]   cc:   Dineen Kid.  Reche Dixon, M.D.

## 2010-07-26 NOTE — Assessment & Plan Note (Signed)
[facility]                            CARDIOLOGY OFFICE NOTE   [name] [name], [name] [name]                         MRN:          [id number]  DATE:08/22/2007                            DOB:          [birth year]    [name] [name] is here for cardiology followup.  She is doing fairly well.  She does not seem to have a lot of palpitations on diltiazem.  I had  kept her on diltiazem and Imdur because of the question of spasm in the  past.  Now, it seems that her blood pressure is running on the lower  side.  We will stop her Imdur and see her back for followup.  The  patient is not having chest pain.  She has not had syncope or  presyncope.  As mentioned that she is having very few palpitations.  She  is going about full activities.  She is not having significant shortness  of breath.   ALLERGIES:  No known drug allergies.   MEDICATIONS:  Amitriptyline, Premarin, fexofenadine, Pravachol, TriCor,  Norvir, aspirin, isosorbide (to be stopped today), diltiazem ER 180,  Reyataz, and Truvada.   OTHER MEDICAL PROBLEMS:  See the list below.   REVIEW OF SYSTEMS:  Other than some fatigue and noting her low blood  pressure, her review of systems is negative.   PHYSICAL EXAMINATION:  Weight is 136 pounds, which is a decrease from  her prior visit.  Blood pressure is in the range of 100/64 with a pulse  of 46.  It has been noted that her heart rate is on the slow side by  other physicians recently.  The patient is oriented to person, time, and place.  Affect is normal.  HEENT:  No xanthelasma.  She has normal extraocular motion.  There are  no carotid bruits.  There is no jugular venous distention.  LUNGS:  Clear.  Respiratory effort is not labored.  CARDIAC:  S1 with an S2.  There are no clicks or significant murmurs.  ABDOMEN:  Soft.  She has no peripheral edema.   EKG reveals old nonspecific ST-T wave changes.  She does have sinus  bradycardia at a rate of 48.   PROBLEMS:  1. History of colon polyps.  2. Status post appendectomy, cholecystectomy, hysterectomy, and      ventral hernia repair.  3. History of gastric polyps.  4. Gastroesophageal reflux disease.  5. Asthma.  6. Hyperlipidemia, treated.  7. Human immunodeficiency virus since 1993 from a needlestick and she      is treated for this.  8. Minimal coronary disease by catheterization in the past.  9. Possibly some type of coronary spasm in the past.  We had used      Norvasc and Imdur, and then she was switched to diltiazem and      Imdur.  She appears to be stable.  With relative hypotension, we      will stop the Imdur and keep her on diltiazem, both for her      supraventricular rhythms and for her question of spasm in the  past.  10.Palpitations.  We have not known the exact etiology.  We have been      very careful not to use beta-blockers because of the question of      spasm.  We had done a Holter monitor in 2008, and she had some PACs      and PVCs.  It was at that time that we decided to change her      Norvasc to diltiazem and she is feeling good with this.  11.Resting sinus bradycardia.  We will keep this in mind relative to      her blood pressure and other issues.  She has had a fairly slow      heart rate in general overtime, but not as slow as currently.  I      will see her for followup to be sure that she is stable on the      medication changes.     Luis Abed, MD, [facility]  Electronically Signed    JDK/MedQ  DD: 08/22/2007  DT: 08/22/2007  Job #: [id number]   cc:   Dineen Kid. Reche Dixon, M.D.

## 2010-07-26 NOTE — Assessment & Plan Note (Signed)
 [facility]                            CARDIOLOGY OFFICE NOTE   NAME:[name], ARCHITA LOMELI                         MRN:          [id number]  DATE:01/25/2007                            DOB:          May 13, 1950    DATE OF VISIT:  January 25, 2007.   [name] is here for followup.  See my note of December 28, 2006.  We  decided to place a 24-hour Holter.  During that period, she did not have  any significant symptoms.  She had some scattered PACs and PVCs, but no  marked abnormalities.  Today she is feeling well.  She still has some  palpitations and she is concerned that this may affect her work.  Because of this, I think we will proceed to try to adjust her medicines  for a potential supraventricular arrhythmia.   We know that there is question of coronary spasm.  Therefore, beta  blockers will not be used.  She is on Amlodipine and switching her to  Diltiazem would be very reasonable in that it would still help protect  from coronary spasm and it could help if she has supraventricular  arrhythmias.   PAST MEDICAL HISTORY:   ALLERGIES:  No known drug allergies.   MEDICATIONS:  Amitriptyline, Premarin, Fexofenadine, Sustiva, Lexiva,  __________, Tricor, Norvir, aspirin, Norvasc (to be switched to  Diltiazem), and Imdur 30.   OTHER MEDICAL PROBLEMS:  See the list on my note of December 28, 2006.   REVIEW OF SYSTEMS:  She is feeling well other than the infrequent  palpitations.   PHYSICAL EXAMINATION:  VITAL SIGNS:  Blood pressure today is 126/74 with  a pulse of 62.  GENERAL:  The patient is oriented to person, time, and place.  Affect is  normal.  HEENT:  No xanthelasma.  She has normal extraocular motion.  LUNGS:  Clear.  Respiratory effort is not labored.  CARDIAC:  An S1 with an S2.  There are no clicks or significant murmurs.  EXTREMITIES:  She has no significant peripheral edema.   Problems are listed on my note of December 28, 2006:  1.  Palpitations.  We will switch her from Norvasc 5 to Diltiazem      Extended Release 180 and then I will see her back in two to three      weeks.     Luis Abed, MD, [facility]  Electronically Signed    JDK/MedQ  DD: 01/25/2007  DT: 01/26/2007  Job #: [id number]

## 2010-07-26 NOTE — H&P (Signed)
[name] [name], [name] [name]                  ACCOUNT NO.:  [id number]   MEDICAL RECORD NO.:  [id number]          PATIENT TYPE:  INP   LOCATION:  1334                         FACILITY[facility]  [facility]   PHYSICIAN:  Lonia Blood, M.D.      DATE OF BIRTH:  12/21/1950   DATE OF ADMISSION:  07/23/2006  DATE OF DISCHARGE:                              HISTORY & PHYSICAL   PRIMARY CARE PHYSICIAN:  Dr. Reche Dixon, [facility].   PRESENTING COMPLAINT:  Fever.   HISTORY OF PRESENT ILLNESS:  The patient is a 60 year old female with  history of HIV disease who has been off her medications for about a  month.  Per patient she started getting charged for co-pay and she did  not have the money initially.  She has started working now and has the  money to pay, but she has not resumed her medications.  She started  having fever about 2 days ago that got worse last night.  She decided to  come to the emergency room.  Associated with this was some dysuria and  frequency.  She also has had some bilateral flank pain.  Otherwise, she  has been able to go to work up until now.   PAST MEDICAL HISTORY:  Her past medical history is significant for HIV  disease, dyslipidemia, hypertension, previous chest pain, seasonal  allergies and previous UTI's; also GERD and some mild asthma, history of  gastric polyps with partial resection, history of ventral hernia.  She  is status post bilateral salpingo-oophorectomy, cholecystectomy,  appendectomy and removal of colon polyps.   ALLERGIES:  She is allergic to BACTRIM and LATEX.   MEDICATIONS:  Include amitriptyline, amlodipine, fexofenadine,  isosorbide mononitrate, Lexiva, pravastatin, Premarin, Sustiva, Tricor  and Pravachol; but, as indicated, the patient has not been taking any  for the past 1 month and cannot recall the doses.   SOCIAL HISTORY:  The patient lives in [place].  She lives with her  daughter.  The patient currently started working.  She used to smoke  about a pack per day, but she quit in 1998.  No alcohol or IV drug use.   FAMILY HISTORY:  Her mom died from MI at the age of 27.  Her father also  died from MI at the age of 19.  The patient has a sister with congenital  heart disease and a brother who had coronary artery disease and also  CABG.   REVIEW OF SYSTEMS:  A 12 point review of systems is negative except for  generalized weakness and except per HPI.   PHYSICAL EXAMINATION:  VITAL SIGNS:  On exam today temperature is 103.1,  blood pressure 119/70, pulse 78, respiratory rate 18, sat's 95% on room  air.  GENERAL APPEARANCE:  Generally she is awake, alert, oriented  and  in no acute distress.  HEENT: PERRL.  EOMI.  NECK:  Her neck is supple.  No JVD, no lymphadenopathy.  RESPIRATORY:  She has good air entry bilaterally.  No wheezes or rales.  CARDIOVASCULAR:  The patient has S1-S2, no murmurs.  ABDOMEN:  Abdomen is soft, nontender with positive bowel sounds.  EXTREMITIES:  No edema, cyanosis or clubbing.   LABORATORY DATA:  Labs show sodium 134, potassium 3.5, chloride 103, CO2  21, glucose 96, BUN 10, creatinine 0.68, calcium 8.9, total protein 7.1,  albumin 3.5, AST 38, ALT 26, white count 3.6, hemoglobin 13.8, platelet  count to 213,000.  Urinalysis showed amber urine which is cloudy, small  bilirubin, trace ketones, small leukocyte esterase, many epithelial  cells, white blood cells 0-2.   ASSESSMENT:  Therefore, this is a 60 year old patient with human  immunodeficiency virus disease, presenting with acute febrile illness.  The patient's chest x-ray is essentially negative for any acute  pneumonia.  However, she has been off her HIV medicines for awhile.  The  differentials therefore vary; most likely urinary tract infection,  especially with flank pain and bilateral costovertebral angle  tenderness.  This may reflect possible pyelonephritis.  However, HIV  disease itself, especially since she has been off her  medications, if  her viral load is high that could also be responsible for the febrile  illness.  Other opportunistic infections are likely, but so far no  evidence of such.   PLAN:  The plan therefore be:  1. Acute febrile illness and possible pyelonephritis.  Will admit the      patient and start her on IV ciprofloxacin for possible acute      pyelonephritis.  Continue with urine culture in this case.  I will      check her HIV viral load and CD-4 counts.  I will get infectious      disease consult in the morning.  2. Human immunodeficiency virus disease.  Again the patient has been      off her medications for awhile.  Will get ID consult for a decision      on restarting her HIV medications.  Meanwhile we will await CD-4      count and viral loads.  3. Dyslipidemia.  I will check a fasting lipid panel and hopefully      restart her Statin's while in the hospital.  4. Depression.  The patient again is not taking her medications.  She      has been on Lexapro and I will probably restart the Lexapro.  5. Hypertension, blood pressure seems to be okay at this point, so no      significant changes to her medications.  6. Further treatment will depend on the patient's response while in      the hospital.      Lonia Blood, M.D.  Electronically Signed     LG/MEDQ  D:  07/24/2006  T:  07/24/2006  Job:  [id number]

## 2010-07-26 NOTE — Assessment & Plan Note (Signed)
[facility]                            CARDIOLOGY OFFICE NOTE   NAME:[name] [name], [name] [name]                         MRN:          [id number]  DATE:12/28/2006                            DOB:          1950/03/20    [name] is here for Cardiology followup.  Historically, she has had  chest pain, and we thought there may be a component of spasm.  For this  reason, she is not on a beta blocker, and she is on Norvasc and a  nitrate.  She has done well with this.  Most recently, for approximately  2 weeks, however, she has had some palpitations.  She has had no syncope  or presyncope.  She notices the palpitations both at rest and with  walking.   She has minimal coronary disease proven by catheterization in 2001 and  2003.  We keep her off beta blockers as outlined above.   PAST MEDICAL HISTORY:   ALLERGIES:  NO KNOWN DRUG ALLERGIES.   MEDICATIONS:  1. Amitriptyline.  2. Premarin.  3. Fexofenadine.  4. Sustiva.  5. Lexiva.  6. Pravachol.  7. Tricor.  8. Norvir.  9. Aspirin.  10.Norvasc 5.  11.Imdur 30.   OTHER MEDICAL PROBLEMS:  See the list below.   REVIEW OF SYSTEMS:  Patient has palpitations as outlined.  Other than  this, her review of systems is negative.   PHYSICAL EXAMINATION:  Weight is 148 pounds.  Blood pressure is 120/62  with a pulse of 69.  The patient is oriented to person, time, and place.  Affect is normal.  HEENT:  Reveals no xanthelasma.  She has normal extraocular motion.  There are no carotid bruits.  There is no jugular venous distention.  LUNGS:  Clear.  Respiratory effort is not labored.  CARDIAC EXAM:  Reveals an S1 with an S2.  There are no clicks or  significant murmurs.  ABDOMEN:  Soft.  She has normal bowel sounds.  She has no peripheral edema.   EKG reveals no diagnostic change.   PROBLEMS:  Include:  1. History of colon polyps.  2. Appendectomy.  3. Cholecystectomy.  4. Hysterectomy.  5. Ventral hernia  repair.  6. Gastric polyps.  7. Gastroesophageal reflux disease.  8. Asthma.  9. Hyperlipidemia, treated.  10.Human immunodeficiency virus since 1993 from a needle stick.  11.Minimal coronary disease by catheterization.  12.Possibility of some type of coronary spasm for which she is on      Norvasc and Imdur.  13.Palpitations.  At this time, the exact etiology is not clear to me.      We have chosen not to use beta blockers for the reasons outlined      above.  I am hesitant to try any other medications for her      palpitations without having a better understanding of what her      rhythm is.  Therefore, we have decided to proceed with a 24-hour      Holter, and will then see her back to reassess how to proceed.   I will see  her back in followup after the Holter.     Luis Abed, MD, [facility]  Electronically Signed    JDK/MedQ  DD: 12/28/2006  DT: 12/29/2006  Job #: [id number]   cc:   Dineen Kid. Reche Dixon, M.D.

## 2010-07-26 NOTE — Assessment & Plan Note (Signed)
[facility]                            CARDIOLOGY OFFICE NOTE   NAME:[name], [name] [name]                         MRN:          [id number]  DATE:01/02/2008                            DOB:          [birth year]    [name] [name] is seen for followup for history of supraventricular  tachycardia and bradycardia and hypotension and chest discomfort.  Overtime, we have questioned the possibility that she might have some  type of spasm.  Medicines were used for this.  Ultimately, however, her  blood pressure has been on the low side and we decided to stop her  diltiazem and follow her in clinic.  She has had some slight chest  discomfort.  However, she is active.  She has not had any syncope or  presyncope.   ALLERGIES:  No known drug allergies.   MEDICATIONS:  Amitriptyline, Premarin, Pravachol, Tricor, Norvir,  aspirin, Reyataz, Truvada, and Claritin.   OTHER MEDICAL PROBLEMS:  See the complete list on my note of December 11, 2007.   REVIEW OF SYSTEMS:  The patient is not having any GI or GU symptoms.  She is not having any fevers or chills.  There are no significant skin  rashes.  Otherwise, her review of systems is negative.   PHYSICAL EXAMINATION:  VITAL SIGNS:  Blood pressure is 106/74 with a  pulse of 60.  GENERAL:  The patient is oriented to person, time, and place.  Affect is  normal.  HEENT:  No xanthelasma.  She has normal extraocular motion.  NECK:  There are no carotid bruits.  There is no jugular venous  distention.  LUNGS:  Clear.  Respiratory effort is not labored.  CARDIAC:  An S1 with an S2.  There are no clicks or significant murmurs.  ABDOMEN:  Soft.  EXTREMITIES:  There is no significant peripheral edema.   Problems are listed on the note of December 11, 2007.  I am going to  leave her off diltiazem.  I will not be changing any of her other meds.  I will not be ordering any test at this point.  We will try to follow  her clinically.  I  will see her back in 6 months.     Luis Abed, MD, [facility]  Electronically Signed    JDK/MedQ  DD: 01/02/2008  DT: 01/02/2008  Job #: [id number]

## 2010-07-26 NOTE — Discharge Summary (Signed)
NAMESIENNAH, [name] [name]                  ACCOUNT NO.:  [id number]   MEDICAL RECORD NO.:  [id number]          PATIENT TYPE:  INP   LOCATION:  1334                         FACILITY:  [facility]   PHYSICIAN:  Elliot Cousin, M.D.    DATE OF BIRTH:  March 18, 1950   DATE OF ADMISSION:  07/23/2006  DATE OF DISCHARGE:  07/27/2006                               DISCHARGE SUMMARY   DISCHARGE DIAGNOSES:  1. A febrile illness thought to be secondary to urinary tract      infection and possibly human immunodeficiency virus disease.  2. Human immunodeficiency virus/acquired immunodeficiency syndrome.      During hospital course, the patient's CD-4 count was 180 and her      viral load was 6850.  3. Low back pain thought to be secondary to degenerative joint disease      and possibly the urinary tract infection.   SECONDARY DISCHARGE DIAGNOSES:  Please see the dictated history and  physical.   MEDICATIONS:  1. Cipro 500 mg b.i.d. for 10 more days.  2. Septra DS 1 pill on Monday, Wednesday and Friday. Start after Cipro      is completed.  3. Lexiva 700 mg b.i.d.  4. Norvir 100 mg b.i.d.  5. Sustiva 600 mg at bedtime.  6. Amitriptyline 50 mg q.h.s.  7. Amlodipine 5 mg daily (restart after you discuss medication with      your cardiologist or primary care physician).  8. Atenolol question dose daily (start after you discuss medication      with your cardiologist).  9. Allegra 180 mg daily.  10.Isosorbide mononitrate 30 mg daily.  11.Premarin 0.625 mg daily.  12.Pravachol 80 mg daily.  13.Tricor 145 mg daily.   DISCHARGE DISPOSITION:  The patient was discharged home in improved and  stable condition on [id number].  She was advised to follow up with  her primary care physician Dr. Donia Guiles in 7 days and her  cardiologist Dr. Myrtis Ser in approximately 2 weeks.   CONSULTATIONS:  Lenn Sink, M.D.   PROCEDURE PERFORMED:  1. MRI of the lumbar spine.  The results revealed no evidence of      epidural  abscess or other signs of spinal infection.  Degenerative      disk disease and bilateral facet hypertrophy at L4-L5 without      resulting spinal stenosis or nerve root encroachment.  2. Chest X-Ray: On [id number] revealed no acute findings.   HISTORY OF PRESENT ILLNESS:  The patient is a 60 year old woman with a  past medical history significant for HIV, hypertension, and  dyslipidemia, who presented to the emergency department on  [id number] with a chief complaint of subjective fever and chills.  When she was evaluated in the emergency department, her temperature was  noted to be 103.1.  The patient acknowledged that she had not been  taking her chronic medications for several weeks secondary to her  inability to pay for the medications (specifically the copay).  The  patient was therefore admitted for further evaluation and management.   For additional details please see the dictated history  and physical by  Lonia Blood, M.D.   HOSPITAL COURSE.:  Problem 1.  FEBRILE ILLNESS/URINARY TRACT INFECTION.  For further evaluation, blood cultures and an urinalysis was ordered.  A  chest x-ray was ordered as well to rule out infection.  The patient's  chest x-ray revealed no acute findings.  However, her urinalysis was  positive for leukocytes.  The patient was therefore started on  intravenous ciprofloxacin.  The patient complained of low back pain  bilaterally which was felt to be secondary to  the urinary tract  infection or possibly pyelonephritis.  However, given the patient's high  fevers, an MRI of the lumbar spine was ordered to rule out an abscess.  The MRI of the lumbar spine as performed on May13,2008 revealed no  evidence of abscess or any other source of infection..  Over the course  of the hospitalization, the patient's back pain completely resolved.  She had no complaints of painful urination over the past 24 hours.  She  completed 4 days of intravenous antibiotic therapy with  Cipro.  Dr.  Lenn Sink was consulted during the hospitalization.  He  recommended that the patient complete 10 more days of Cipro following  hospital discharge.  Of note, the patient's urine culture grew out  35,000 colonies of multiple bacteria, none predominant.  The patient may  have received Cipro prior to the urine culture.  The patient was  completely afebrile at the time of hospital discharge.   Problem 2.  HIV DISEASE.  As stated above, the patient had been unable  to acquire her medications because of financial constraints.  Dr. Lenn Sink was consulted  recommendations regarding the patient's HIV  disease and  febrile illness.  Dr. Roxan Hockey agreed with Cipro for the  urinary tract infection.  He recommended obtaining the MRI of the lumbar  spine to rule out an abscess as indicated above.  The patients viral  load and CD-4 count were assessed during the hospitalization.  The viral  load was found to be 6850 and the CD-4 count was found to be 180.  Dr.  Roxan Hockey recommended that the patient complete the 10-day course of  Cipro as an outpatient.  Once the Cipro was completed, she should start  Septra DS 1 tablet on Monday, Wednesday and Fridays.  In addition, Dr.  Roxan Hockey recommended that the patient restart her HAART medications as  previously prescribed including Lexiva 700 mg b.i.d., Norvir 100 mg  b.i.d., and Sustiva 600 mg q.h.s. The patient understood the  recommendations.   DISCHARGE LABORATORY DATA:  Blood cultures negative.  TSH within normal  limits at 3.539.  Urine drug screen negative.  Sodium 138, potassium  4.2, chloride 110, CO2 23, glucose 98, BUN 3, creatinine 0.67, calcium  8.3. WBC 3.6, hemoglobin 13.8, platelets 213.      Elliot Cousin, M.D.  Electronically Signed     DF/MEDQ  D:  07/27/2006  T:  07/27/2006  Job:  [id number]   cc:   Dineen Kid. Reche Dixon, M.D. Fax: [phone]   Rockey Situ. Flavia Shipper., M.D.  Fax: [phone]

## 2010-07-29 NOTE — Discharge Summary (Signed)
[name] [name] [name], [name] [name]                              ACCOUNT NO.:  [id number]   MEDICAL RECORD NO.:  [id number]                   PATIENT TYPE:  INP   LOCATION:  5736                                 FACILITY:  [facility]   PHYSICIAN:  Ladell Pier, M.D.                DATE OF BIRTH:  27-Jan-1951   DATE OF ADMISSION:  03/30/2002  DATE OF DISCHARGE:  04/03/2002                                 DISCHARGE SUMMARY   DISCHARGE DIAGNOSES:  1. Community acquired pneumonia.  2. Bilateral leg pain, unspecified.  3. HIV disease.  4. Non-obstructive coronary artery disease.   DISCHARGE MEDICATIONS:  1. Aspirin 81 mg p.o. every day.  2. Amitriptyline 50 mg p.o. q.h.s.  3. Metoprolol 50 mg p.o. b.i.d.  4. Allegra 180 mg p.o. every day  5. Pravachol 40 mg p.o. every day.  6. Premarin 0.625 mg p.o. every day.  7. Agenerase 600 mg p.o. q.i.d.  8. Norvir 100 mg p.o. b.i.d.  9. Sustiva 600 mg q.h.s.  10.      Lasix 40 mg p.o. p.r.n.  11.      Tequin 400 mg p.o. every day times five days.   DISPOSITION/FOLLOWUP:  The patient has a follow up appointment at [facility] and [facility] on Tuesday, January 27 at 11:40 a.m. with Dr.  Philipp Deputy.   DIAGNOSTIC PROCEDURES:  Chest x-ray performed on January 18 revealed a right  middle lobe infiltrate.   CONSULTATIONS:  None.   ADMISSION HISTORY AND PHYSICAL EXAM:  The patient is a 60 year old, white  female with HIV disease (CD4 count 220, viral load less than 50), coronary  artery disease, and arthritis, who presented to the Emergency Department  with a one day history of fever of 100.2, nonproductive cough and sharp  bilateral leg pain.  The patient states cough started last night when she  noticed a fever.  The patient took three doses of Tylenol over an eight hour  period which resulted in a decrease in the fever, but no change in her  cough.  The patient also noted upper respiratory symptoms, nausea and  vomiting times four, headaches, chills.   Her sick contact history is  significant for a boyfriend who is also HIV positive, who was hospitalized  for PCP two weeks prior to the patient's presentation.  The patient had a  similar episode of fever, non-productive cough in January of 2003 in which  she was found to have community acquired pneumonia, treated for three days  in the hospital with antibiotics and discharged without complication.   PHYSICAL EXAMINATION:  VITAL SIGNS:  Temperature 98.5, heart rate 120, blood  pressure 124/50, respirations 20, oxygen saturation 90% on room air.  GENERAL:  The patient was in no apparent distress, lying on an emergency  room bed, alert and oriented.  HEENT:  Normocephalic and atraumatic.  Extraocular movements  were intact.  Pupils were equal, round and reactive to light and accommodation.  Conjunctivae were pink bilaterally.  Tympanic membranes were clear without  any exudate.  NECK:  Supple, without masses, no JVD, no lymphadenopathy.  OROPHARYNX:  Mucous membranes were moist, without erythema, without exudate.  CARDIOVASCULAR:  Regular rate and rhythm without murmur, normal S1 and S2,  without S3.  1+ pulses bilaterally without clubbing or cyanosis, 2+ lower  extremity edema bilaterally.  RESPIRATORY:  Decreased breath sounds on the right with basilar crackles.  ABDOMEN:  Soft, nontender, not distended, hyperactive bowel sounds.  SKIN:  Without rashes.  MUSCULOSKELETAL:  5/5 strength throughout.  The patient had increased  tenderness to palpation in the lower extremities bilaterally.  NEUROLOGICAL:  Cranial nerves II through XII were grossly intact.  The  patient was alert and conversant, and appropriate.   ADMISSION LABORATORY DATA:  Sodium was 139, potassium 3.6, chloride was 107,  bicarb was 24, BUN 18, creatinine 0.8, glucose 114, hemoglobin was 17,  hematocrit was 50, white blood cell count 33.6, platelets 207, absolute  neutrophil count was 31.6, MCV 90.9, LDH was 202, D-dimer  was 0.76 and  urinalysis was negative for glucose, protein, ketones, leukocyte esterase  and nitrates.   HOSPITAL COURSE:  1. Community acquired pneumonia.  Patient with clinical symptoms of non-     productive cough, fever and history of sick contact in addition to chest     x-ray findings make pneumonia a likely cause of the patient's chest pain.     The patient does note sick contact of an individual who was diagnosed as     having PCP pneumonia, making that a potential cause in the patient;     however, opportunistic infections that generally occur in     immunocompromised individuals are less likely in this patient because her     CD4 count is relatively maintained, but still low at 220.  In thinking     that the patient had a community acquired pneumonia, she was started on     Tequin 400 mg IV in an effort to cover all potential common pathogens.     However, sputum cultures were drawn in addition to AFB smears and stain,     Legionella, urinary antigen, and stain for PCP were also obtained.     Subsequent studies were all negative in this patient.  The patient     remained afebrile throughout hospital admission, and on day one of     hospitalization patient's white count dropped significantly from 33.1 on     admission to 15.7, but she still had predominance of neutrophils with     87%.  The patient noted an increase in cough, but less pain after     antibiotics were started as well.  Therefore, the thought was that the     patient may have a reactive airway syndrome of some  sort, so she was     started on Albuterol nebulizer treatment by hospital day two which led to     a marked improvement in her breathing.  By hospital day four, the patient     had minimal cough and shortness of breath, and noted that she felt much     better.  The patient was encouraged to continue incentive spirometry and    ambulate as to decrease the likelihood of persistent consolidation.  The      patient was also changed from IV Tequin to oral Tequin  in an effort to     prepare her for subsequent discharge.  By the end of hospital day three,     early on four, the patient stated that she felt much better with     decreased work of breathing, decreased cough, decreased sputum     production, and felt afebrile and was afebrile throughout the course.     The patient was thus discharged home with oral Tequin and follow up     appointment with her primary care provide at [facility] in one week to     assess the success of treatment.   1. Bilateral leg pain.  Patient with history of chronic lower extremity     edema.  The patient stated that this pain was more persistent and felt     different from the pain that she had previously felt in reference to her     leg pain.  The patient was given Lasix p.o. to reduce the swelling in an     effort to decrease the likelihood that this could be contributing to her     pain.  At the end of hospital day one, the patient still showed no     subjective improvement in pain after Lasix was administered, therefore,     the thought was that the patient may have a myopathic condition     contributing to her muscular pain.  The patient did have risk factors for     myopathy, those being current HIV medications in addition to statin use     and fact that patient was on 60 mg of Lipitor.  At this time, we stopped     the patient's Lipitor and checked a creatinine kinase which revealed a     level of 133 which was not elevate and/or indicative of a severe     myopathic process contributing to the patient's bilateral leg pain.  The     thought then was given to change the patient's statin to Pravachol which     is shown to have less adverse interactions with HIV medications and a     decreased likelihood of inducing a myopathic process.  Upon discharge,     the patient had been off of statin medication for two days without much     significant change in her  leg pain.  The patient did note that she was     left weak and could walk slightly better, but the pain still was present.     At discharge, the patient's leg pain was still unspecified and the     etiology still unclear to [place]; however, we felt that a myopathic process     was less likely given the non-elevated creatinine kinase, but the patient     may have a neuropathic process secondary to HIV medications or current     statin use at that time.  The patient was informed upon discharge to let     her primary care Chenita Ruda know of the bilateral leg pain and further work     up could be done as an outpatient.   1. HIV disease.  Patient noted to have HIV disease with a CD4 count of 220,     viral load less than 10.  Patient currently was on Agenerase, Norvir and     Sustiva which provided the patient with adequate viral reduction;     however, did not allow her CD4 count to grow.  The patient is adherent    with her medications and has done well taking them thus far, therefore,     the thought was made to intensify the patient's regimen by adding     __________ to her current medications in an effort to boost her CD4 count     and provide her with better stability with regards to her HIV.  Dr.     Bea Laura was contacted in regards to this and it was felt that this was     better done as an outpatient upon her follow up with Dr. Bea Laura at [facility].  Therefore, the patient was continued on her current regimen of     Agenerase, Norvir and Sustiva upon discharge until informing Dr. Bea Laura     of the potential intensifying of her regimen.   DISCHARGE LABORATORY:  Upon discharge, the patient's TSH was drawn and was  2.89, B12 was 492, serum folate was 8.0, viral load was less than 50, blood  cultures were negative to date with no growth.  AFB stain was negative.  RPR  was non-reactive.  Legionella antigen was negative.  Sputum gram smear  revealed moderate WBCs with a predominance of PMNs,  a few squamous  epithelial cells, a few gram positive cocci in pairs and rare gram negative  rods.                                               Ladell Pier, M.D.    [place]/MEDQ  D:  04/11/2002  T:  04/11/2002  Job:  [id number]   cc:   Tresa Endo L. Philipp Deputy, M.D.  [phone] [address]  [place] [address]  Fax: [phone]

## 2010-07-29 NOTE — Discharge Summary (Signed)
[name] [name], [name] [name]                              ACCOUNT NO.:  [id number]   MEDICAL RECORD NO.:  [id number]                   PATIENT TYPE:  INP   LOCATION:  4734                                 FACILITY:  [facility]   PHYSICIAN:  Madaline Guthrie, MD                   DATE OF BIRTH:  Jul 24, 1950   DATE OF ADMISSION:  12/17/2001  DATE OF DISCHARGE:  12/20/2001                                 DISCHARGE SUMMARY   CONSULTATIONS:  [facility]   DISCHARGE DIAGNOSES:  1. Severe refractory gastroesophageal reflux disease causing chest pain.  2. Minimal coronary artery disease with trace left anterior descending     artery plaque, left ventricular ejection fraction 50 to 55%.  3. Human immunodeficiency virus, last CD4 count 260, last viral load 1000     per patient on HAART therapy.  4. Hyperlipidemia.  5. Asthma.  6. History of community-acquired pneumonia.  7. History of lumbar arthritis.   DISCHARGE MEDICATIONS:  1. Elavil 50 mg p.o. q.h.s.  2. Norvir 100 mg p.o. b.i.d.  3. Sustiva 200 mg 3 pills p.o. q.h.s.  4. Agenerase 150 mg 4 pills b.i.d.  5. Prilosec 20 mg p.o. b.i.d., an increase from her prior regimen of 20 mg     p.o. q.d.  6. Lipitor 60 mg p.o. q.d.  7. Allegra 180 mg p.o. q.d.  8. Calcium carbonate p.o. b.i.d.  9. Miacalcin nasal spray daily.  10.      Metoprolol 50 mg p.o. q.d.  11.      Mylanta 3 to 4 tablespoons up to 4 times a day as needed.   PROCEDURES:  1. A 2-D echocardiogram on 12/18/2001 by [facility].  2. Cardiac catheterization on 12/19/2001 by [facility].  3. Chest x-ray 12/17/2001.   HISTORY OF PRESENT ILLNESS:  The patient presented with chief complaint of  chest pain. This pain began approximately two weeks prior to admission with  a stabbing pain going from her back between her shoulder blades, radiating  through her chest and under her left breast after this episode which  resolved spontaneously after a few minutes.  She had other  episodes of  squeezing chest pain, the first of these experienced while walking.  This  episode lasted 5 to 10 minutes and resolved with rest.  She had a number of  subsequent episodes of similar pain, all of which resolved with rest.  She  presented at [facility] for attention when an episode again radiated into  her shoulder. She had no cough nor orthopia and no reflux symptoms.  She had  also had no fever or upper respiratory tract infection symptoms.  She had  presented for chest pain two years previously and had a negative Cardiolite  test and negative cardiac catheterization.  She stated that this pain was  more severe than her pain at that time.  She presented with multiple cardiac  risk factors including a 30% occlusion of the LAD on a prior  catheterization, although it did rule out acute causes of chest pain.  In  addition, she has a cardiac risk factor of smoking one pack a day x 20  years, although she did quit five years ago.  She is additionally taking  hormone-replacement therapy for 24 years due to surgical menopause, and she  has hyperlipidemia mostly secondary to proteus inhibitor treatments.   PHYSICAL EXAMINATION:  VITAL SIGNS:  On admission, pulse 73, blood pressure  80/60 which then progressed to 93/46 and then 118/80. Temperature 97.5,  respirations 18, oxygen saturation 93% on room air and 96% on 2 liters nasal  cannula.  GENERAL:  Examination revealed an obese woman in no apparent distress  receiving nitroglycerin by drip.  HEENT:  Exam was unremarkable.  NECK:  Supple with very mild JVD.  LUNGS:  Respirations unlabored with crackles revealed at the base of the  right lung.  There were found to be fine crackles with no wheezes.  CARDIOVASCULAR:  Regular rate and rhythm with regular S1, S2.  No murmurs,  rubs, or gallops.  ABDOMEN:  Exam was normal revealing soft abdomen with normal bowel sounds.  EXTREMITIES:  1+ pitting edema of lower extremities  bilaterally.  RECTAL:  Heme-negative stool and normal exam.  SKIN:  There were two erythematous papular lesions noted above the right  breast. These were less than 1 cm each and had very mild induration.   ADMISSION LABORATORY DATA:  Sodium 140, potassium 2.8, chloride 112,  bicarbonate 18, BUN 19, creatinine 0.5, glucose 87.  White blood cell count  6.4, hemoglobin 13.1, hematocrit 38.1. platelets 185, ANC 3.8, MCV 90.9.  PT  11.5, INR 0.8, PTT 21.  On admission, total CK was 40, CK-MB was 1.0,  troponin I was 0.03.  Later that evening, CK total was 44, CK-MB 0.8,  troponin I was 0.03.  Lipase 25.   EKG:  There was no old EKG for comparison.  Found to be normal sinus rhythm  with rate in the 70s and normal axis and intervals. There was some T wave  inversion with a less than 1 mm ST segment depression in leads V1 through  V3.   HOSPITAL COURSE:  1. CHEST PAIN:  The patient's presentation was very concerning for unstable     angina versus acute MI.  She did receive a 2-D echocardiogram which     revealed overall left ventricular systolic function at lower limit of     normal with left ventricular ejection fraction at 50 to 55% with     hypokinesis of the septal and periapical walls and normal left     ventricular thickness.  In addition, the patient was found to have mild     tricuspid and mitral valvular regurgitation. This study was performed on     12/18/2001.  Cardiac catheterization on 12/19/2001 revealed no change in     the trace LAD plaque as noted before in 2001 with possible decrease of     this lesion and no evidence of ischemia or cardiac explanation for her     recent chest pain.  On admission, we also investigated noncardiac causes     of chest pain as the patient had no reflux symptoms.  She was placed on     the equivalent of her home dose of Prilosec while she was in the hospital  and was preemptively given albuterol and Atrovent to see whether her    chest pain  could be due to asthma symptoms.  As the chest pain with these     but did improve with nitroglycerin, the chest pain was not thought to be     due to asthma or GERD.  In addition, an arterial blood gas obtained early     in the morning on 12/18/2001 revealed alveolar-arterial  gradient of 11.35     thought to be low suspicion fo pulmonary embolism in a patient with no     other symptoms or reasons for coagulopathy.  However, given the negative     workup for cardiac causes of chest pain in addition to a D-dimer of 0.46     on 12/20/2001, the patient was thought to be ruled out for cardiac or     pulmonary embolus chest pain, and the cause of chest pain was deemed not     to be life threatening probably due to refractory GERD.  The patient has     had GERD for a long time and has been on 20 mg of Prilosec for quite some     time per her history.  Based on the negative workup for other causes of     chest pain, we have presumed this pain, which is similar to cardiac pain     in presentation, to be probable refractory GERD to be treated with     increase of her Prilosec dose and Mylanta p.r.n.  This pain did not recur     over the course of her hospitalization. While it did resolve with     nitroglycerin, it was thought that perhaps this was coincidental that the     pain may have resolved on its own without treatment.   1. HUMAN IMMUNODEFICIENCY VIRUS:  The patient continued on HAART therapy     while in the hospital. We did not perform any intervention. Due to CD4     count of 260, she was not thought to have any HIV-related opportunistic     infections or other infections during this hospitalization or that might     explain her presenting symptoms.   1. HYPERLIPIDEMIA:  While in the hospital, the patient was found to ahve a     total cholesterol of 225, triglycerides 142, LDL 107, HDL 90. She was     counseled with regards to this by a dietary consult, and she has agreed     to watch her  diet more carefully.  She will follow up on these labs with     her primary care physician.   1. ASTHMA:  The patient had no active symptoms while in the hospital. This     was stable.   1. HISTORY OF COMMUNITY ACQUIRED PNEUMONIA:  The patient  continues to have     fine crackles on lung exam; however, the re is no evidence of pulmonary     damage that would require treatment at this time or recurrence of her     community-acquired pneumonia which was treated in June 2003 on her last     admission.   1. LUMBAR ARTHRITIS:  The patient is being treated for this with steroid     injections.  Her pain did not recur during this hospitalization. She has     been stable with regard to this.   DISCHARGE LABORATORY DATA:  Sodium 139, potassium 3.9, chloride  105,  bicarbonate 23, BUN 14, creatinine 0.7, glucose 101.  At the time of  discharge, the patient's chest pain was not present.  Clinically she had  improved, and she was reassured that her chest pain was not found to be of cardiac origin.  She is concerned about what she should do should the  symptoms recur.  She was counseled to take over-the-counter Mylanta as  directed.  Extensive workup has not revealed a cardiac or otherwise  threatening cause for her chest pain; however, she will be followed closely  by Dr. Fannie Knee Drinkard at [facility] and, should this problem recur, we will  possibly evaluate with esophagoscopy studies or other studies to try to  relieve this pain.      Amparo Bristol, MSIV                        Madaline Guthrie, MD    CF/MEDQ  D:  12/20/2001  T:  12/23/2001  Job:  [id number]   cc:   Willis Modena, M.D.  Helath Serve

## 2010-07-29 NOTE — Discharge Summary (Signed)
[name], [name]                  ACCOUNT NO.:  [id number]   MEDICAL RECORD NO.:  [id number]          PATIENT TYPE:  INP   LOCATION:  2017                         FACILITY:  [facility]   PHYSICIAN:  Jesse Sans. Wall, M.D.   DATE OF BIRTH:  [birth year]   DATE OF ADMISSION:  10/19/2005  DATE OF DISCHARGE:  10/20/2005                                 DISCHARGE SUMMARY   DISCHARGE DIAGNOSES:  1. Atypical chest discomfort of uncertain etiology, history of essentially      normal catheterizations in July 2001 and October 2003.  2. Hyperlipidemia.  3. History as listed below.   SUMMARY OF HISTORY:  [name] is a 60 year old female who on October 15, 2005, began having some dizziness with intermittent substernal chest  pressure which she gave a 5-6 scale of 1/10 with radiation to the left  shoulder, associated with nausea, shortness of breath, and would last less  than 5 seconds.  This would occur 3-4 times a day and resolve spontaneously.  However, on the morning of presentation her symptoms recurred and lasted  approximately 1 minute.  She presented to [facility] to see her primary  care physician.  She was given aspirin and nitroglycerin to obtain some  relief, but according to the patient no real change.   PAST MEDICAL HISTORY:  1. Notable for a normal catheterization in July 2001, repeat      catheterization and October 2003 which showed a 20% proximal      circumflex.  2. She has a history of HIV positive since 1993.  3. Hyperlipidemia.  4. Mild asthma.  5. GERD.  6. Gastric polyps with partial resection.  7. Ventral hernia.  8. Status post BSO, cholecystectomy, appendectomy, colon polyps.  9. Last echocardiogram was in August 2003, showed an EF of 50-55%, with      periapical and septal hypokinesis, mild MR.  10.She also has a history of remote tobacco use.   LABORATORY DATA:  CK, MBs and troponins were within normal limits x2.  TSH  was 2.271.  Fasting lipids showed total  cholesterol of 191, triglycerides  168, HDL 70, LDL 87.  D-dimer was less than 0.22,.  __________ 71, PT 14.3.  Sodium 136, potassium 3.4, BUN 10, creatinine 0.9.  normal LFTs except for  alkaline phosphatase was slightly low at 36,.  H&H is 13.0 and 39.0, normal  indices, platelets 246, WBC 6.0.  EKGs showed sinus rhythm, early R-wave,  nonspecific ST-T wave changes, negative changes from prior EKG.  Chest x-  ray:  Report is pending at the time of this dictation; however, after review  via E-Chart by myself, it does not appear to have any active disease.   She was initially placed on IV heparin and nitroglycerin by the ER staff and  admitted to the hospital for observation.  Overnight she did not have any  further symptoms.  Enzymes and EKGs were unremarkable for myocardial  infarction, and D-dimer was unremarkable.  After review, Dr. Daleen Squibb felt that  she could be discharged home on her preadmission medications with outpatient  follow-up with her primary care physician.   DISPOSITION:  The patient is discharged home on her preadmission  medications.  These include:  1. Nuvir two times a day, unknown dosage.  2. Sustiva 600 mg q.h.s.  3. Lexiva 700 mg b.i.d.  4. Pravachol 40 mg q.h.s.  5. Elavil 50 mg q.h.s.  6. Premarin 0.625 mg daily.  7. Lopressor 25 mg b.i.d.  8. Aspirin 81 mg daily.   She was asked to arrange a follow-up appointment with [facility].  Our  office will call her at home with a follow-up appointment with Dr. Myrtis Ser.   DISCHARGE TIME:  Less than 30 minutes.     ______________________________  Joellyn Rued, PA-C      ______________________________  Jesse Sans Wall, M.D.    EW/MEDQ  D:  10/20/2005  T:  10/20/2005  Job:  [id number]   cc:   Hartley Barefoot, MD, [facility]

## 2010-07-29 NOTE — Assessment & Plan Note (Signed)
[facility]                              CARDIOLOGY OFFICE NOTE   NAME:[name], [name] GOTO                         MRN:          [id number]  DATE:11/06/2005                            DOB:          10-12-1950    [name] [name] has a chronic chest pain syndrome.  In the past several weeks,  the patient has had chest discomfort.  She actually was admitted to [facility] on October 19, 2005.  Her enzymes were negative.  Her D-Dimer was normal.  It was felt that because she had had normal catheterizations multiple times  in the past that she could be discharged home.  She is now seen for  followup.  She does say that she is having discomfort, and it can be either  exertional or at rest.  It is of note that the patient had only minimal  coronary disease at her last cath.  There was some right coronary spasm with  the introduction of the catheter which was relieved by intracoronary  nitroglycerin.   The patient has substantial other medical problems that are listed below,  including HIV positive since 1993, reportedly due to a needle stick while in  the hospital.   PAST MEDICAL HISTORY:   ALLERGIES:  NO KNOWN DRUG ALLERGIES.   MEDICATIONS:  1. Amitriptyline.  2. Premarin.  3. Fexofenadine.  4. Sustiva.  5. Leviva b.i.d..  6. Metoprolol 50 daily.  7. Pravachol 80.  8. Tricor 145.  9. Norvir 100 b.i.d.Marland Kitchen  10.Prevacid.  11.Aspirin 81.   OTHER MEDICAL PROBLEMS:  See the list below.   REVIEW OF SYSTEMS:  Today, other than her chest discomfort, her review of  systems is negative.   PHYSICAL EXAMINATION:  VITAL SIGNS:  Blood pressure is 98/60 with a pulse of  64.  The patient is oriented to person, time and place, and her affect is  normal.  LUNGS:  Clear.  RESPIRATORY:  Not labored.  HEENT:  Reveals no xanthelasma.  She has normal extraocular motion.  There  are no carotid bruits.  There is no jugular venous distention.  CARDIAC:  Reveals S1 and S2.  She  has no clicks or significant murmurs.  ABDOMEN:  Soft.  There are no masses or bruits.  She has no significant  peripheral edema.  There are no musculoskeletal deformities.   EKG is done today. She has nonspecific ST-T wave changes that are old.   PROBLEM LIST:  1. History of colon polyps, appendectomy, cholecystectomy, hysterectomy,      ventral hernia repair, and gastric polyps with partial resection in the      past.  2. Gastroesophageal reflux disease.  3. Asthma.  4. Hyperlipidemia.  5. HIV positive since 1993 reportedly from a needle stick while in the      hospital.  6. Minimal coronary disease in the past.  She was cathed in 2001 and 2003.      She did have some spasm with the right coronary injection in 2003.  7. Recurring chest discomfort.  At this point, I am still  not convinced      this is cardiac.  Because of the possibility of spasm, I will lower her      beta blocker dose and possibly stop it completely, and see if she can      take a small dose of amlodipine (Norvasc) to see if this helps her.  We      will arrange this.  I will then see her back.  We will then decide      about the possibility of further nuclear studies or other evaluation.                               Luis Abed, MD, [facility]    JDK/MedQ  DD:  11/06/2005  DT:  11/07/2005  Job #:  [id number]   cc:   Clydie Braun L. Hal Hope, MD

## 2010-07-29 NOTE — Assessment & Plan Note (Signed)
[facility]                              CARDIOLOGY OFFICE NOTE   NAME:[name] [name] TOMER                         MRN:          [id number]  DATE:01/05/2006                            DOB:          March 12, 1951    [name] [name] is back for followup. See my prior notes outlining the approach  to her chest pain. We have not known exactly what the basis of her chest  pain is. She has not had proven coronary disease in the past. Because of the  possibility that she might have spasm of some type, I weaned her off her  beta blocker and have her on a small dose of Norvasc and most recently  started a small dose of Imdur. Today she returns and in fact she is feeling  better than she has felt. The episodes of discomfort are less frequent and  less intense. She is doing better.   PAST MEDICAL HISTORY:   ALLERGIES:  No known drug allergies.   MEDICATIONS:  Amitriptyline, Premarin, fexofenadine, Sustiva, __________ ,  Pravachol, Tricor, Norvir, Prevacid, aspirin, Norvasc and isosorbide.   OTHER MEDICAL PROBLEMS:  See the complete list on my note of November 06, 2005.   REVIEW OF SYSTEMS:  Overall, she is feeling better. Review of systems is  otherwise negative except that she has had some mild dizziness.   PHYSICAL EXAMINATION:  VITAL SIGNS:  Blood pressure is 99/67 with a pulse of  85. This blood pressure is in the range of blood pressure that we have seen  with her in the past. She is oriented to person, time and place and her  affect is normal.  HEENT:  Reveals no xanthelasma. She has normal extraocular motion. There are  no carotid bruits, there is no jugular venous distention.  LUNGS:  Clear. Respiratory effort is not labored.  CARDIAC:  Reveals an S1 with an S2. There are no clicks or significant  murmurs.  ABDOMEN:  Soft.  EXTREMITIES:  She has no peripheral edema.   No labs are done today.   PROBLEMS:  Listed on my note of November 07, 2005.  1. HIV  positive since 1993, reportedly due to a needle stick while in the      hospital.  2. Very minimal coronary disease in the past. Recurring chest pain that      may well be from      spasm. She is doing better on her current medicines as outlined above.      No change in her medications. I will see her back in 6 months.    ______________________________  Luis Abed, MD, [facility]    JDK/MedQ  DD: 01/05/2006  DT: 01/06/2006  Job #: [id number]   cc:   Clydie Braun L. Hal Hope, M.D.

## 2010-07-29 NOTE — H&P (Signed)
[name] [name], [name] [name]                         ACCOUNT NO.:  [id number]   MEDICAL RECORD NO.:  [id number]                   PATIENT TYPE:  INP   LOCATION:  3003                                 FACILITY:  [facility]   PHYSICIAN:  Leighton Roach McDiarmid, M.D.             DATE OF BIRTH:  Sep 17, 1950   DATE OF ADMISSION:  10/23/2003  DATE OF DISCHARGE:  10/26/2003                                HISTORY & PHYSICAL   ADMITTING SERVICE:  Family Practice Teaching Service.   CHIEF COMPLAINT:  Blood in her stools and abdominal pain.   HISTORY OF PRESENT ILLNESS:  [age]-year-old white female came to the ER  after passing stools x2 with bright red blood.  Each bowel movement was  associated with crampy abdominal pain, intensity 7, located in lower  abdomen.  The first bowel movement was at 2 p.m.; the second bowel movement  was at 3:30 p.m.  Bright blood was mixed with the stool, in the toilet, and  in the water.  The patient also referred to mild dyspnea this p.m.  Denies  nausea, vomiting, diarrhea.  No burning sensation on urination, no fever.   PAST MEDICAL HISTORY:  1. HIV-positive, diagnosed in 1993.  2. Anterior ischemia and heart block diagnosed at the beginning of this     year.  3. Hypercholesterolemia.  4. Reflux.  5. Community-acquired pneumonia x1.  6. Previous hospitalization for P. carinii.   SURGERIES:  1. Polypectomy (__________  ) 2 years ago.  2. Cholecystectomy in 1993.  3. Partial gastrectomy 29 years ago.  Patient does not know the reason for     the partial gastrectomy.  4. Hysterectomy with oophorectomy in 1975.   ALLERGIES:  BACTRIM.   MEDICATIONS:  Metoprolol.  Lexiva.  Norvir.  Sustiva.  Amitriptyline.  Pravachol.  Nexium. Nitroglycerin.  Premarin.  Allegra.   FAMILY HISTORY:  HTN (both parents), heart attack (mother deceased at 74  years old), diabetes.   SOCIAL HISTORY:  No smoking.  No illicit drugs.  No alcohol.  Lives with  husband.  The patient has 3  kids, a 59 year old female and 2 twins, female,  age 65.   PHYSICAL EXAMINATION:  VITALS:  Temperature 98.1, heart rate 89, respiratory  rate 18, blood pressure 107/61.  GENERAL:  No acute distress.  Oriented x3.  HEENT:  Atraumatic.  Normocephalic.  PERRLA.  No scleral icterus.  No pale  conjunctivae.  Mucous membranes moisturized.  NECK:  No thyromegaly.  CARDIOVASCULAR SYSTEM:  RRR, normal S1 and S2, no murmurs.  RESPIRATORY:  CTA, bilateral.  No wheeze, no rales, no crackles.  ABDOMEN:  Soft, ND, tenderness on deep palpation on lower abdomen, intensity  7, bowel sounds positive, no hepatosplenomegaly.  SKIN:  No rash, warm and dry.  EXTREMITIES:  Nontender, normal ROM, no pedal edema, no calf tenderness.  NEUROLOGIC:  Cranial nerves II-XII:  Intact.  Sensation intact.   LABORATORY DATA:  WBC 6.1, hemoglobin 13.8, hematocrit 40.2, platelets  264,000.  Differential:  Neutrophils 55%, lymphocytes 31%, monocytes 12%,  eosinophils 1%, basophils 0%.  PT 11.96 seconds.  APPT 24 seconds.  INR 0.8.  AB blood group:  A, Rh-negative.  IB screen negative.  PCO2 40, pH 7.4,  bicarb 25.3.  BMP:  Sodium 138, potassium 4.2, chloride 109, CO2 26, BUN 18,  creatinine 8.7, glucose 89.  MCHC 34.2, MCH 89.9.   ASSESSMENT AND PLAN:  [age]-year-old white female, human  immunodeficiency virus-positive, with lower gastrointestinal bleeding.   1. Lower gastrointestinal bleeding:  Hematochezia.  Differential diagnosis -     - diverticular disease, angiodysplasia, polyps, neoplasia, hemorrhoids.     The patient has a positive history for polyps.  Admitted for observation.     Consult gastroenterology in a.m.  Last colonoscopy was 2 years ago.  We     will repeat colonoscopy.  Nothing by mouth after meal.  Hemoccult stools.     Protonix for reflux.  ESR for possible neoplastic process.  D-5 half     normal saline at maintenance.  2. Human immunodeficiency virus:  CD4 count 220 in January 2004.  Viral  load     less than 50 in January 2004.  The patient is afebrile.  We will continue     to watch temperature.  Continue home medications for human     immunodeficiency virus.  3. __________  prophylaxis:  Patient mobile.      Henri Medal, MD                   Leighton Roach McDiarmid, M.D.    FIM/MEDQ  D:  10/26/2003  T:  10/27/2003  Job:  [id number]

## 2010-07-29 NOTE — Cardiovascular Report (Signed)
   NAMEKEISHA, [name]                              ACCOUNT NO.:  [id number]   MEDICAL RECORD NO.:  [id number]                   PATIENT TYPE:  INP   LOCATION:  4734                                 FACILITY:  [facility]   PHYSICIAN:  Salvadore Farber, M.D. [facility]         DATE OF BIRTH:  04-04-1950   DATE OF PROCEDURE:  12/18/2001  DATE OF DISCHARGE:                              CARDIAC CATHETERIZATION   PROCEDURE:  Coronary angiography.   INDICATIONS FOR PROCEDURE:  The patient is a 60 year old lady with HIV who  presents with chest pain.  Angiography performed September 16, 1999, for episode  of chest pain demonstrated a 40% stenosis of her mid LAD.  She is now  admitted with a recurrence of the same discomfort.  Enzymes have been  negative and electrocardiogram without ischemic changes.  She is referred  for diagnostic angiography.   DIAGNOSTIC TECHNIQUE:  Informed consent was obtained.  Under 2% lidocaine  local anesthesia, a #6 French sheath was placed in the right femoral artery  using the modified Seldinger technique.  Angiography was performed using  Williams right and Judkins left catheters.  The patient tolerated the  procedure well and was transferred to the holding room in stable condition.   COMPLICATIONS:  None.   FINDINGS:  1. Left main:  Angiographically normal.  2. LAD:  There is a 20% stenosis after the takeoff of the single large     diagonal branch.  3. Circumflex:  The circumflex gives rise to a single obtuse marginal     branch.  There is no angiographic disease.  4. RCA:  The RCA is the dominant vessel.  It is normal.  There was     substantial spasm of the ostium of the RCA which responded to  tangent     catheter and intracoronary nitroglycerin.   IMPRESSION/PLAN:  Minimal coronary disease.   RECOMMENDATIONS:  Evaluation for noncardiac etiology of her chest pain  syndrome.                                               Salvadore Farber, M.D. [facility]    WED/MEDQ  D:   12/19/2001  T:  12/22/2001  Job:  [id number]

## 2010-07-29 NOTE — Op Note (Signed)
NAMECYANNA, [name] [name]                         ACCOUNT NO.:  [id number]   MEDICAL RECORD NO.:  [id number]                   PATIENT TYPE:  INP   LOCATION:  3003                                 FACILITY:  [facility]   PHYSICIAN:  Lina Sar, M.D. [facility]               DATE OF BIRTH:  10/16/1950   DATE OF PROCEDURE:  10/24/2003  DATE OF DISCHARGE:                                 OPERATIVE REPORT   PROCEDURE:  Colonoscopy and polypectomy.   INDICATIONS FOR PROCEDURE:  This 60 year old white female, HIV positive  disease, presented yesterday with large volume hematochezia, but normal  hemoglobin of 13 gm. She has had decreased abdominal pain, but no rectal  pain and no focal pain. Her stool was maroon, Hemoccult positive. She has  never had GI blood loss before. She is undergoing flexible  sigmoidoscopy/colonoscopy after getting two tap water enemas to assess the  left colon bleed. The patient had a colonoscopy about two years ago by Dr.  Marina Goodell with finding of colon polyps.   ENDOSCOPE:  Single-channel video endoscope.   SEDATION:  Versed 7 mg IV, Fentanyl 75 mcg IV.   FINDINGS:  Fujinon single-channel video endoscope was passed under vision  through the rectum to the sigmoid colon. The patient was monitored by pulse  oximeter and oxygen saturation was between 92% to 94%. Her blood pressure  dropped to 90 systolic during the procedure. She responded to IV fluids. The  anal canal and rectal ampulla showed traces of old blood. There was a medium  size sessile colon polyp 5 cm in the rectal ampulla which showed stigmata of  recent bleeding. There were some blood clots adherent to it, but there was  no active bleeding. The colonoscope advanced through the sigmoid colon and  to the descending colon. There was no blood in the left colon and there was  no diverticula. The colonoscope passed easily over the splenic flexure.  There was no stool in the transverse colon, splenic flexure, or ascending  colon. I was able to easily negotiate all the way to the cecum which was  normally distended with normal ileocecal valve and appendiceal openings.  There was no sign of ischemic colitis or AVMs. Colonoscope was then slowly  retracted to the right to the transverse to the left colon. At that point  the polyp was again visualized and was snared with cautery and delivered to  specimen container. There was no bleeding from the post polypectomy site.  The size of the polyp was about 12 mm in diameter. The patient tolerated the  procedure well. Retroflexion of endoscope in the rectum revealed no evidence  of hemorrhoids.   IMPRESSION:  1. Rectal polyp at 5 cm, status post snare polypectomy.  2. Status post gastrointestinal blood loss from polyp.   PLAN:  1. Await results of pathology.  2. The patient should stay off aspirin or NSAIDs for two weeks.  3. Resume previous ordered medications as well as diet.                                               Lina Sar, M.D. [facility]    DB/MEDQ  D:  10/24/2003  T:  10/24/2003  Job:  [id number]

## 2010-07-29 NOTE — Discharge Summary (Signed)
[name], [name]                         ACCOUNT NO.:  [id number]   MEDICAL RECORD NO.:  [id number]                   PATIENT TYPE:  INP   LOCATION:  3003                                 FACILITY:  [facility]   PHYSICIAN:  Leighton Roach McDiarmid, M.D.             DATE OF BIRTH:  [birth year]   DATE OF ADMISSION:  10/23/2003  DATE OF DISCHARGE:                                 DISCHARGE SUMMARY   [name] is a 60 year old woman admitted to [facility] on October 23, 2003, with complaints of bright red blood per rectum by attending  physician, Leighton Roach McDiarmid, M.D., to the family practice teaching service.  During this hospital course, her hemoglobin and hematocrit have remained  stable.  She was evaluated by colonoscopy and was found to have evidence of  recent bleeding.  One polyp from her descending cecum was removed.  Pathology is pending at this point.  During the hospitalization, she  reported weakness and dizziness.  She was treated with IV hydration.  No  blood transfusions were required as her H&H remained stable throughout this  hospitalization.  There was no bright red blood per rectum reported before  or after the colonoscopy, although she was found to have heme-positive  stools in the emergency room.  Her hemoglobin and hematocrit at the time of  discharge were 12.7 and 32.1, respectively, white blood cell count 6.1 and  platelets 212.  Her past medical history is significant for HIV disease.  She was discharged on the following medications.  Elavil, Premarin, Sustiva,  Lexiva, Claritin, metoprolol.  Protonix, Pravastatin, Ritonivir and  Phenergan with an appointment to follow up with Dr. Margarette Canada at [facility],  her primary medical physician, on November 08, 2003, at 9:40 a.m.  Other than  consult by GI and the colonoscopy, there were no procedures during this  hospitalization.  She was discharged with no restrictions and instructed to  eat a heart healthy diet and to call  Dr. Margarette Canada or return to the emergency  room if she noticed a return or increase in bright red blood per rectum.  Her condition at the time of discharge was stable.      [name], MD                     Leighton Roach McDiarmid, M.D.    JT/MEDQ  D:  10/26/2003  T:  10/26/2003  Job:  [id number]

## 2010-07-29 NOTE — Assessment & Plan Note (Signed)
[facility]                              CARDIOLOGY OFFICE NOTE   NAME:[name], [name] LAPAGLIA                         MRN:          [id number]  DATE:12/07/2005                            DOB:          20-May-1950    HISTORY OF PRESENT ILLNESS:  [name] continues to have chest pain.  At  the time of her last visit, I had stopped her beta blocker for the  possibility that she might have coronary spasms.  She says she may feel a  little better.  She still has some pain.  I will now give her some  nitroglycerin in the form of Imdur to see how she feels.   ALLERGIES:  No known drug allergies.   MEDICATIONS:  1. Amitriptyline.  2. Premarin.  3. Fexofenadine  4. Sustiva.  5. Leviva.  6. Pravachol.  7. Tricor.  8. Norvir.  9. Prevacid.  10.Aspirin.  11.Norvasc.   OTHER MEDICAL PROBLEMS:  See the complete list on my note of 11/06/05.   REVIEW OF SYSTEMS:  The only issue is the chest discomfort.  Otherwise, her  review of systems is negative.   PHYSICAL EXAMINATION:  GENERAL:  Blood pressure today is 100/66 with a pulse  of 73.  The patient is oriented to person, time and place.  Affect is  normal.  LUNGS:  Clear.  Respiratory effort is not labored.  HEENT:  No xanthelasma.  She has normal extraocular motion.  There are no  carotid bruits.  There is no jugular venous distention.  CARDIAC:  S1 and S2.  There are no clicks or significant murmurs.  ABDOMEN:  The abdomen is soft.  There are no masses or bruits.  There is no  significant peripheral edema.   IMPRESSION AND PLAN:  Problems are listed on my note of 11/06/05.   Recurring chest pain.  Beta blocker has now been stopped.  She is on low  dose Norvasc.  We will add Imdur and then see her for followup.            ______________________________  Luis Abed, MD, [facility]     JDK/MedQ  DD:  12/07/2005  DT:  12/09/2005  Job #:  [id number]

## 2010-07-29 NOTE — H&P (Signed)
[name] [name], [name] [name]                  ACCOUNT NO.:  [id number]   MEDICAL RECORD NO.:  [id number]          PATIENT TYPE:  EMS   LOCATION:  MAJO                         FACILITY:  [facility]   PHYSICIAN:  Ok Anis, NPDATE OF BIRTH:  February 13, 1951   DATE OF ADMISSION:  10/19/2005  DATE OF DISCHARGE:                                HISTORY & PHYSICAL   PATIENT PROFILE:  60 year old Caucasian female with a prior history of non-  obstructive coronary artery disease and HIV who presents with recurrent  chest pain.   PROBLEM LIST:  1. Unstable angina/non-obstructive coronary artery disease.      a.     Normal catheterization September 16, 1999.      b.     December 18, 2001, cardiac catheterization with left main normal,       LAD 20% proximal, left circumflex normal, RCA with spasm with       introduction of catheter relieved by intracoronary nitroglycerin, but       otherwise, normal.  2. HIV positive since 1993 reportedly from needle stick while in hospital.  3. Hyperlipidemia since 2002.  4. Mild asthma.  5. GERD.  6. History of gastric polyps with partial resection in the past.  7. Status post ventral hernia repair.  8. Status post TAHBSO.  9. Status post cholecystectomy.  10.Status post appendectomy.  11.History of colon polyps.   HISTORY OF PRESENT ILLNESS:  60 year old Caucasian female with a history of  non-obstructive CAD and HIV who was in her usual state of health until this  past Sunday, August 5, when she began to experience intermittent dizziness,  weakness, and 5-6 out of 10 substernal chest pressure with radiation to the  left shoulder associated with nausea and shortness of breath occurring 3-4  times per day and lasting less than 5 seconds and resolving spontaneously.  This morning, she had recurrent symptoms, now lasting approximately one  minute prior to resolving spontaneously and symptoms have occurred about 4-5  times already today.  She saw her primary care physician  at [facility] and  an EKG was performed revealing sinus rhythm with ST depression and T-wave  inversion in leads v1 and v2 which actually is not a new finding on her EKG  and she was given aspirin and nitroglycerin for chest pain she had in the  office with partial relief and then taken via ambulance to the [facility]  ED.  She is currently pain free on heparin and nitroglycerin.  Her first set  of cardiac markers by point of care are negative.   ALLERGIES:  BACTRIM causes a rash.   HOME MEDICATIONS:  Norvir 100 mg b.i.d., Sustiva 600 mg q.h.s., Lexiva 700  mg b.i.d., Pravachol 40 mg q.h.s., Tricor 48 mg q.h.s., Elavil 50 mg q.h.s.,  Premarin 0.625 mg daily, Lopressor 25 mg b.i.d.   FAMILY HISTORY:  Mother died of MI at age 13, father died of MI at age 106.  She has a sister who has congenital heart disease and a brother who had CAD  and CABG.   SOCIAL  HISTORY:  She lives in [place] with her daughter.  She does not  work.  She has a 20-pack-year history of tobacco use quitting in 1998.  She  denies any alcohol or drugs.  She was riding a stationary bicycle about 10-  15 minutes a day but has not done this for the past week.   REVIEW OF SYSTEMS:  Positive for chest pain, shortness of breath, dizziness,  weakness, and nausea as outlined in the HPI.  All other systems are reviewed  and negative.   PHYSICAL EXAMINATION:  VITAL SIGNS:  Temperature 97.9, heart rate 66, respirations 20, blood  pressure 100/68, pulse ox 95% on room air.  GENERAL:  Pleasant white female in no acute distress.  Awake, alert and  oriented x3.  NECK:  No bruits or JVD.  LUNGS:  Respirations are unlabored, clear to auscultation.  HEART:  Regular S1 and S2, no S3, S4, or murmurs.  ABDOMEN:  Round, soft, nontender, nondistended, bowel sounds present x4.  EXTREMITIES:  Warm and dry, pink, no cyanosis, clubbing, and edema.  Dorsalis pedis and posterior tibial pulses are 2+ and equal bilaterally.   Her chest  x-ray is pending.  EKG shows sinus rhythm with a rate of 71 and  normal axis and ST depression and T wave inversion in leads v1 and v2 with  flattening of the ST segment in lead v3.  These changes are not new when  compared to EKGs from October 2003 and December 2005.   LABORATORY DATA:  Hemoglobin 13, hematocrit 39, WBC 6, platelets 246.  Sodium 137, potassium 3.6, chloride 107, CO2 19, BUN 11, creatinine 0.9,  glucose 85.  CK MB less than 1, troponin I less than 0.05.  Calcium 8.9.   ASSESSMENT/PLAN:  1. Chest pain.  This is fairly atypical and intermittent in nature lasting      just a few seconds and resolving spontaneously throughout this past      week with increased frequency and duration this morning.  Symptoms are      similar to the chest pain she experienced prior to her previous two      normal catheterizations.  The plan is to admit, cycle cardiac markers,      and continue aspirin, statin, beta blocker, heparin, and nitrate.  Will      also check a D-dimer.  If cardiac markers are positive or if she      continues to have chest pain, would likely require repeat cardiac      catheterization given her family history and risk factors.  However, if      chest pain resolves and markers are negative, could consider functional      study, likely inpatient.  2. Hyperlipidemia.  Will check lipids and LFTs, continue statin and Tricor      therapy.  3. HIV.  Continue home meds.  4. GERD.  Will add PPI.      Ok Anis, NP     CRB/MEDQ  D:  10/19/2005  T:  10/19/2005  Job:  [id number]   cc:   Willa Rough, M.D.

## 2010-07-29 NOTE — Discharge Summary (Signed)
[place]  Patient:    [name] [name], [name] [name]                      MRN: [id number] Adm. Date:  [id number] Disc. Date: [id number] Attending:  Farley Ly Dictator:   Kinnie Scales. Reed Breech, M.D. CC:         Marcos Eke. Hal Hope, M.D.                           Discharge Summary  DISCHARGE DIAGNOSES:  1. Chest pain of negative Cardiolite.  2. Human immunodeficiency virus.  3. Asthma.  4. Gastric polyps.  5. Colon polyps, status post polypectomy.  6. Ventral hernia status post repair.  7. Breast masses, status post lumpectomy.  8. History of palpitations.  9. Status post hysterectomy 20 years ago. 10. History of hypercholesterolemia.  DISCHARGE MEDICATIONS: 1. Videx. 2. Sustiva. 3. Agenerase. 4. Norvir. 5. Prilosec 20 mg. 6. Lipitor. 7. Albuterol inhaler p.r.n. 8. Benadryl 25 mg one p.o. q.h.s. 9. Amitriptyline 50 mg p.o. q.h.s.  CONSULTING PHYSICIANS:  [facility].  PRIMARY M.D.:  Marcos Eke. Hal Hope, M.D. at [facility].  PROCEDURE:  Cardiac Cardiolite.  HISTORY OF PRESENT ILLNESS:  This 60 year old white female presents with chest ain that started in the morning while driving.  The pain was substernal and radiated to the left jaw, but not down the left arm.  She denies diaphoresis, shortness of breath, or irregular heartbeat until she called her primary M.D. and these symptoms did come on.  Oral nitroglycerin in the ED was able to decrease her chest pain o 6/10.  After being started on nitroglycerin drip, she was able to be without chest pain.  She reports she was hospitalized a year prior to this for chest pain and  workup was negative.  The patients EKG did not show any significant changes from past EKG.  LABORATORY DATA:  Sodium 138, potassium 5.1, chloride 110, bicarb 27, BUN 18, creatinine 0.6, glucose 89, troponin I less than 0.03, CK total 119, MB 1.2. EKG did show ST changes in V2, V5, and question of V6 and T wave  inversions in V1 through V2, but this was similar to a past EKG.  The patient was admitted for chest pain, rule out MI.  HOSPITAL COURSE:  #1 - Cardiac.  [facility] was consulted for restratification of this patient.  EKG changes did not occur during hospitalization.  Enzymes were negative x 3.  A Cardiolite was performed and did not show any evidence of ischemia. The patient was given Lovenox and aspirin and nitroglycerin drip while hospitalized. Following the procedure, spiral CT did not show evidence of pulmonary embolus. A After a negative Cardiolite, the patients nitroglycerin was weaned, Lovenox was  discontinued, and the patient was discharged.  #2 - Pulmonary.  The patient was given albuterol while hospitalized.  She did not have any pulmonary symptoms.  A spiral CT was performed to rule out a PE.  This was negative.  #3 - GI.  The patient did have several reports of heartburn and GI discomfort. She was given a GI cocktail in the emergency room and this relieved her symptoms. Phenergan was given via IV for her nausea and we felt it resolved.  The patient was given Protonix while hospitalized for GI prophylaxis.  #4 - HIV.  The patient was started near her home dosing of HIV medications.  #5 - Postmenopausal.  The patient was given Premarin per her usual dose.  After a negative Cardiolite, the patient was discharged with instructions to follow up with Clydie Braun L. Hal Hope, M.D. in [facility].  She was doing well. She was able to ambulate.  Nausea and vomiting decreased.  She was reportedly improved. DD:  05/30/99 TD:  05/30/99 Job: [id number]

## 2010-07-29 NOTE — Consult Note (Signed)
[name] [name], [name] [name]                              ACCOUNT NO.:  [id number]   MEDICAL RECORD NO.:  [id number]                   PATIENT TYPE:  INP   LOCATION:  4734                                 FACILITY:  [facility]   PHYSICIAN:  Rollene Rotunda, M.D. [facility]            DATE OF BIRTH:  [birth year]   DATE OF CONSULTATION:  12/18/2001  DATE OF DISCHARGE:                                   CONSULTATION   REASON FOR CONSULTATION:  Evaluate patient with chest pain.   HISTORY OF PRESENT ILLNESS:  The patient is a pleasant 60 year old white  female with a prior history of chest discomfort evaluated with cardiac  catheterization in 2001.  At that time, she had an LAD 30 to 40% mid vessel  stenosis and an normal EF. She reports now with chest discomfort for the  last couple of weeks.  She describes a substernal squeezing discomfort.  It  was occurring initially with exertion such as climbing a flight of stairs.  It would radiate up into her neck.  It was 8/10 at its highest intensity.  She would feel tired with this and shortness of breath.  She would stop what  she was doing, and it would go away in a few minutes.  She says this has  become progressive and coming on with more intensity on less exertion.  She  had a few episodes at rest.  Here in the hospital, she has been noted to  have T wave inversions in her anterior leads on her EKG, although I do not  have an old one for comparison.  This has been unchanged over serial EKGs.  She was pain free on IV nitroglycerin; but when this was discontinued, she  had recurrent discomfort responsive to sublingual nitroglycerin. She says  the symptoms are unlike the chest discomfort for which she was catheterized  in 2001.  This is unlike her previous GERD symptoms.   PAST MEDICAL HISTORY:  1. HIV positive.  (The patient reports secondary to a needle stick in a     hospital).  2. Nonobstructive coronary disease.  3. Hyperlipidemia x 1 year.  4. Mild asthma.  5. Gastroesophageal reflux disease.   PAST SURGICAL HISTORY:  1. Partial gastric resection, apparently for polyps.  2. Ventral hernia repair.  3. Total abdominal hysterectomy/bilateral salpingo-oophorectomy.  4. Cholecystectomy.  5. Appendectomy.   ALLERGIES:  BACTRIM causes a rash.   MEDICATIONS (HOME):  1. Elavil 50 mg q.h.s.  2. Norvir 100 mg b.i.d.  3. Sustiva 600 mg q.h.s.  4. Amprenavir 150 mg 4 b.i.d.  5. Premarin 0.625 mg a day.  6. Metoprolol 50 mg b.i.d.  7. Lipitor 60 mg q.d.  8. Prilosec 20 mg q.d.  9. Miacalcin.  10.      Calcium carbonate.  11.      Allegra.   SOCIAL HISTORY:  The patient lives in [place]  with her boyfriend.  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 She has one daughter. Quit smoking  five years ago with a 20-pack-year history.  She denies drugs or alcohol  use.   FAMILY HISTORY:  Contributory for mother dying of myocardial infarction at  age 39 and a father with myocardial infarction at age 41.   REVIEW OF SYSTEMS:  As stated in the HPI and otherwise negative for all  other systems.   PHYSICAL EXAMINATION:  GENERAL:  The patient is in no distress.  VITAL SIGNS:  Blood pressure 113/70, heart rate 80 and regular, afebrile.  HEENT:  Eyelids unremarkable.  Pupils are equal, round, and reactive to  light.  Fundi not visualized.  Oral mucosa unremarkable.  Upper dentures.  NECK:  No jugular venous distention, waveform within normal limits, carotid  upstroke brisk and symmetric, no bruits, no thyromegaly.  LYMPHATICS:  No cervical, axillary, or inguinal adenopathy.  LUNGS:  Clear to auscultation bilaterally.  BACK:  No costovertebral angle tenderness.  CHEST:  Unremarkable.  HEART:  PMI not displaced or sustained.  S! and S2 within normal limits.  No  S3, no S4, no murmurs.  ABDOMEN:  Obese, positive bowel sounds, normal in frequency and pitch.  No  bruits, rebound, guarding, midline pulsatile mass, hepatomegaly, or  splenomegaly.   SKIN:  NO rash, nodules.  EXTREMITIES:  2+ pulses, no edema, no cyanosis, no clubbing.  NEUROLOGIC:  Oriented to person, place, and time.  Cranial nerves II-XII  grossly intact.  Motor grossly intact.   LABORATORY DATA:  EKG:  Sinus rhythm, rate 86, axis within normal limits,  intervals within normal limits, anterior T wave inversions V1 through V3  consistent with ischemia.  No old EKGs for comparison.   Hemoglobin 13.1, WBC 5.7, platelets 183.  BUN 11, creatinine 0.6, sodium  139, potassium 3.8.  CK-MB negative x 3.  Troponin negative x 3.  TSH 1.9.  Total cholesterol 225, triglycerides 142, HDL 90, and LDL 107.   Cardiomegaly, COPD.   ASSESSMENT/PLAN:  1. Chest.  The patient's chest discomfort is consistent with unstable     angina.  She had nonobstructive coronary disease two years ago on     previous catheterization.  She has risk factors and an abnormal     electrocardiogram.  Given all of this, she needs to have a cardiac     catheterization to exclude the possibility of obstructive coronary     disease.  Will manage her in the meantime with nitroglycerin paste.  2.     Risk reduction.  The patient needs to have an LDL level less than 100.     However, she has an excellent HDL.  I would be reluctant to go up on her     Lipitor given interaction with Norvir.  She should be instructed on diet     and exercise.                                               Rollene Rotunda, M.D. [facility]    JH/MEDQ  D:  12/18/2001  T:  12/21/2001  Job:  [id number]   cc:   Madaline Guthrie, MD

## 2010-07-29 NOTE — Assessment & Plan Note (Signed)
[facility]                              CARDIOLOGY OFFICE NOTE   NAME:[name], [name]                         MRN:          [id number]  DATE:11/27/2005                            DOB:          21-Mar-1950    [name] is seen for follow-up today.  Continue to search for a way to  control her chest pain.  I reduced her beta blocker dose last visit to 25 mg  daily and added Norvasc.  She has had some pain and taken nitroglycerin for  it.  I continue to consider the possibility of coronary spasm.   PAST MEDICAL HISTORY:  Allergies:  No known drug allergies.   Medications:  Amitriptyline, Premarin, fexofenadine, Sustiva, __________,  metoprolol (to be held), Pravachol, Tricor, Norvir, Prevacid, aspirin,  Norvasc 5 mg.   Other medical problems:  See the complete list in my note of November 06, 2005.   REVIEW OF SYSTEMS:  Other than her chest pain, her review of systems is  negative.   PHYSICAL EXAMINATION:  VITAL SIGNS:  Blood pressure today is 92/64 with a  pulse of 80.  GENERAL:  The patient is oriented to person, time and place and her affect  is normal.  LUNGS:  Clear.  Respiratory effort is not labored.  HEENT:  No xanthelasma.  She has normal extraocular motion.  There are no  carotid bruits.  There is no jugular venous distention.  CARDIAC:  An S1 with an S2.  There are no clicks or significant murmurs.  ABDOMEN:  Soft.  There are no masses or bruit.  EXTREMITIES:  She has no peripheral edema.   EKG today reveals no diagnostic changes.  She has old nonspecific ST-T wave  changes.   PROBLEMS:  See the list in the note of November 06, 2005.   1. Recurring chest pain.  Beta blocker will be stopped completely today,      and we will see if this helps with the slim possibility that spasm is      her problem.  I would like to add a nitrate; however, her blood      pressure is low and I will wait until the next visit, which will be       soon.                               Luis Abed, MD, [facility]    JDK/MedQ  DD:  11/27/2005  DT:  11/28/2005  Job #:  [id number]

## 2010-07-29 NOTE — Assessment & Plan Note (Signed)
[facility]                            CARDIOLOGY OFFICE NOTE   NAME:[name] [name] PROSSER                         MRN:          [id number]  DATE:06/21/2006                            DOB:          Apr 30, 1950    [name] [name] has coronary disease and she is stable.  She has not had  significant chest pain.  There is question that she may have had some  type of spasm.  Because of this, we weaned her off the beta blocker and  put her on Norvasc and a nitrate and she is much better.  She is only  having rare chest discomfort.   PAST MEDICAL HISTORY:  ALLERGIES:  NO KNOWN DRUG ALLERGIES.   MEDICATIONS:  Amitriptyline, Premarin, fexofenadine, Sustiva, __________ , Pravachol,  Tricor, Norvir, Prevacid, aspirin, Norvasc, and isosorbide.   OTHER MEDICAL PROBLEMS:  See the list below.   REVIEW OF SYSTEMS:  She is really feeling well today and doing well  overall, and her review of systems is negative.   PHYSICAL EXAMINATION:  Her weight is 162.  Blood pressure 120/79 with a  pulse of 62.  The patient is oriented to person, time, and place; affect  is normal.  She has no xanthelasma.  There is normal extraocular motion.  She has no carotid bruits.  There is no jugular venous distention.  LUNGS:  Clear, respiratory effort is not labored.  CARDIAC:  Reveals an S1 with an S2.  She has no clicks or significant  murmurs.  ABDOMEN:  Obese but soft.  She has no masses or bruits.  LEGS:  Large but no significant edema.   EKG reveals no significant change.   PROBLEMS:  1. History of colon polyps, appendectomy, cholecystectomy,      hysterectomy, ventral hernia repair, gastric polyps and partial      resection in the __________ .  2. Gastroesophageal reflux disease.  3. Asthma.  4. Hyperlipidemia.  5. Human immunodeficiency virus positive since 1993 from a needle      stick.  6. Minimal coronary disease with catheterization in 2001 and 2003.  We      think she has  some spasm.  While keeping her off beta blockers and      on nitrates and Norvasc she does well.  Continue the same therapy.   Overall she is stable.  I will see her back in one year.     Luis Abed, MD, [facility]  Electronically Signed    JDK/MedQ  DD: 06/21/2006  DT: 06/21/2006  Job #: [id number]   cc:   Donia Guiles, MD

## 2010-07-29 NOTE — Discharge Summary (Signed)
[place]  Patient:    [name] [name], [name] [name] Visit Number: [id number] MRN: [id number]          Service Type: MED Location: 5000 [id number] Attending Physician:  Madaline Guthrie Dictated by:   Hillery Aldo, M.D. Admit Date:  08/11/2001 Discharge Date: 08/13/2001   CC:         [facility]   Discharge Summary  DISCHARGE DIAGNOSES  1. Community acquired pneumonia.  2. Urinary tract infection.  3. History of human immunodeficiency virus, on highly active antiretroviral     therapy (HAART) since 1993. Last CD4 count 200, viral load 1000.  4. History of noncardiac chest pain with a negative Cardiolite and negative     catheterization, ejection fraction 60% in 2001.  5. History of asthma.  6. History of gastric polyps, status post partial stomach resection.  7. History of colon polyps x 2 (benign, removal March 2003 per Dr. Marina Goodell).  8. History of ventral hernia, status post repair.  9. History of benign breast mass removal. 10. History of hysterectomy. 11. Hyperlipidemia. 12. Osteoporosis. 13. Status post cholecystectomy. 14. Status post appendectomy.  DISCHARGE MEDICATIONS  1. Tequin 400 mg p.o. q.d. x 10.  2. Elavil 50 mg p.o. q.h.s.  3. Norvir 100 mg p.o. b.i.d.  4. Sustiva 600 mg p.o. q.h.s.  5. Premarin 0.625 p.o. q.d.  6. Allegra 180 mg p.o. q.d.  7. Prilosec 20 mg p.o. q.d.  8. Lipitor 60 mg p.o. q.d.  9. Calcium supplements 500 mg p.o. b.i.d. 10. Miacalcin nasal spray 1 spray NS q.d. 11. Agenerase 600 mg p.o. b.i.d. 12. Phenergan 25 mg p.o. q.6h. p.r.n. nausea.  CONSULTS: None.  FOLLOW UP: The patient will follow up with her primary care physician at [facility] in one week. She prefers to call and make the appointment herself.  HISTORY OF PRESENT ILLNESS: The patient is a 60 year old HIV positive white female who has been on HAART therapy x 10 years, who presents with a chief complaint of fever to 102 over the last 24 hours. The  patient stated that she initially awoke at 2 a.m. with shaking chills and was feverish for the remainder of the night. The patient also reports having had nausea and vomiting for one episode. Denies any abdominal pain. Denies diarrhea. Denies shortness of breath, cough or congestion. The patient has no known sick contacts. Denies any urinary symptoms. The patient denies headache or mental status changes. The patient does reports having had some right hip pain, but no associated trauma, and that her primary care physician has been working this up with no obvious cause elucidated thus far.   PHYSICAL EXAMINATION  VITAL SIGNS: Temperature 97.3, blood pressure 115/77, pulse 114, respiratory rate 20, oxygen saturation 94% on room air.  GENERAL: A slightly obese white female in no apparent distress.  HEENT: Normocephalic, atraumatic. PERRL. EOMI. Oropharynx clear.  NECK: Supple without evidence of thyromegaly, lymphadenopathy, or jugular venous distention.  LUNGS: Crackles at the right base, otherwise clear.  HEART: Regular rate and rhythm, no murmurs, rubs or gallops.  ABDOMEN: Soft, nontender, nondistended, bowel sounds present x 4.  EXTREMITIES: No cyanosis, clubbing or edema.  NEUROLOGIC: Alert and oriented x 3. Cranial nerves 2 through 12 intact. Nonfocal.  SKIN: No lesions or rashes identified.  INITIAL LABORATORY DATA: Urinalysis was negative for ketones, proteins and nitrites, but did have a small amount of leukocyte esterase. There were 0 to 2 white blood cells and many bacteria. The culture revealed greater  than 100,000 colonies per mL of gram negative rods with the sensitivities pending. Blood cultures reported preliminarily as negative. Comprehensive metabolic panel showed sodium 139, potassium 3.6, chloride 106, bicarbonate 23, glucose 106, BUN 14, creatinine 0.8, calcium 9.2. Total protein 7.5, albumin 3.8, AST 25, ALT 25, alkaline phosphatase 82, total bilirubin  0.6. LDH 152. CBC with differential showed a white blood cell count of 17.9, hemoglobin 13.8, hematocrit 39.8, platelet count 196. The differential showed 93% neutrophils, 6% lymphocytes and 1% monocytes with an absolute neutrophil count of 16.7. Initial blood gas showed a pH of 7.465, pCO2 31.6, pO2 64. Chest x-ray showed right middle lobe infiltrate.  HOSPITAL COURSE BY PROBLEM  1. Fever. The patients fever was thought to be  secondary to a community acquired pneumonia. The patient reported that her last CD4 count was 200, so PCP was felt to be less likely, although still a consideration. The patient was unable to produce sputum for culture, white blood cell count or PCP probe, however, she was empirically treated with Tequin and rapidly improved over the first 24 hours. She remained afebrile throughout the course of her hospitalization. Her symptoms improved and she was requesting to be discharged on August 13, 2001. She will continue Tequin 400 mg p.o. q.d. x 10 days more.  2. Hypoxia. This was felt to be secondary to pneumonia. Her ABG results improved over the next 24 hours after her admission. She was able to maintain her oxygen saturations with ambulation at 95% and post ambulation at 98%.  3. Urinary tract infection. Cultures were pending at the time of this dictation, however, Tequin should provide adequate coverage for the usual urinary pathogens.  4. Nausea. The patients nausea responded well to p.r.n. Phenergan. She was discharged with a prescription for Phenergan to use p.r.n.  DISCHARGE INSTRUCTIONS: The patient was instructed to follow up at [facility] in one week. Additionally she is to return to the emergency department or call her primary care physician for any nonresolution of symptoms or return of fever. The patient verbalized understanding of these discharge instructions and was discharged to home on August 13, 2001. Dictated by:   Hillery Aldo, M.D.  Attending  Physician:  Madaline Guthrie DD:  08/13/01 TD:  08/14/01 Job: [id number]

## 2010-07-29 NOTE — Discharge Summary (Signed)
[name] [name], [name] [name]                    ACCOUNT NO.:  [id number]   MEDICAL RECORD NO.:  [id number]          PATIENT TYPE:  INP   LOCATION:  3003                         FACILITY:  [facility]   PHYSICIAN:  Leighton Roach McDiarmid, M.D.DATE OF BIRTH:  [birth year]   DATE OF ADMISSION:  10/23/2003  DATE OF DISCHARGE:  10/26/2003                                 DISCHARGE SUMMARY   ADMISSION DIAGNOSIS:  60 year old white female with lower gastrointestinal  bleeding.   DISCHARGE DIAGNOSIS:  60 year old white female with lower gastrointestinal  bleeding secondary to sessile polyp in the rectum.   CONSULTATIONS:  Gastroenterology by Dr. Lina Sar.   PROCEDURE:  Rectosigmoidoscopy, colonoscopy, August 13.  Polypectomy  performed on August 13.   HISTORY AND PHYSICAL:  This 60 year old white female came to the ER after  passing bright red blood with stools x 2.  Each bowel movement was  associated with cramping abdominal pain with intensity of 7, located in the  lower abdomen.  The patient denies nausea, vomiting, and diarrhea.  No  burning sensation on urination.  No fever.  The patient has a past medical  history HIV positive diagnosed in 1993.  The patient also had a polypectomy  performed two years ago.  A cholecystectomy in 1993 and partial gastrectomy  29 years ago.   On physical examination, vital signs were within normal limits.  Examination  was unremarkable except for tenderness on abdominal deep palpation on lower  abdomen.  Intensity of the pain was 7 and bowel sounds were positive.  No  hepatosplenomegaly.   LABORATORY DATA:  WBC 6 .1, hemoglobin 13.8, hematocrit 40.2, platelets 264.  Differential with M 55%, L 31%, M 12%, E 1%, B 0%.  PT 11.96, PTT 24, INR  0.8.  BMP was within normal limits.   HOSPITAL COURSE:  The patient was admitted for observation and to rule out  hematochezia causes such as diverticular disease, angiodysplasia, polyps,  neoplasia, hemorrhoids.  A consultation with  gastroenterology was requested.  The patient was placed n.p.o. to repeat colonoscopy.  ESR was obtained to  rule out neoplastic causes.  IV fluids D5 1/2 normal saline at maintenance.  The last CD4 count on January 2004 was 220.  Viral load was less than 50 in  January 2004.  The patient is afebrile.  We continued to watch the  temperature.  Home medications for HIV was prescribed.  On August 13, white  blood cells were 5.6, hemoglobin 12.5, platelets 213.  The patient felt  better, no abdominal pain, no nausea and vomiting.  The patient was  afebrile.  A colonoscopy was performed by Dr. Lina Sar on August 13.  A  medium size 1.2 cm sessile polyp in the rectum was identified.  A  polypectomy was performed.  Dr. Juanda Chance indicated no NSAIDs for two weeks.  The pathology report of the polyp showed villous adenoma.  No dysplasia or  malignancy identified.  On August 14, the patient felt better, only slight  dizziness with activity today.  The patient was having a regular diet.  No  nausea and vomiting.  The patient was afebrile, no signs of infection.  On  August 15, the patient feels stronger, no more dizziness.  No rectal  bleeding.  Vital signs were stable within normal limits.  Laboratory data  showed WBC 6.1, hemoglobin 12.7, hematocrit 32.1, platelets 212.  This 60-  year-old white female with HIV completed her three days at the hospital for  bright red blood per rectum.  Status post polypectomy for polyp removal on  day two.  No rectal bleeding.  The patient was stable.  Symptoms resolved.  No abdomina pain, good p.o. intake.  HIV disease stable with home meds.  The  patient was stable and according to gastroenterologist standpoint, was  ready for discharge home.  Since hematochezia secondary to polyp has  resolved, the patient is stable, family medicine teaching service decided to  discharge home.   CONDITION ON DISCHARGE:  Good, stable.   DISPOSITION:  Discharged to home.    DISCHARGE MEDICATIONS:  Sustiva 600 mg q.h.s., Lexiva 700 mg b.i.d.,  Protonix 40 mg b.i.d., Clavil 50 mg q.h.s., Metoprolol 50 mg b.i.d.,  Claritin 10 mg daily, Premarin 0.625 daily, Pravachol 80 mg daily, Norvir  100 mg b.i.d.   DISCHARGE INSTRUCTIONS:  Diet:  Heart healthy diet.  No aspirin or NSAIDs  (such as ibuprofen) for two weeks.  [facility], at 9:40 a.m.       FIM/MEDQ  D:  01/12/2004  T:  01/12/2004  Job:  [id number]

## 2010-07-29 NOTE — Cardiovascular Report (Signed)
[place]  Patient:    [name] [name], [name] [name]                      MRN: [id number] Proc. Date: 09/16/99 Adm. Date:  [id number] Disc. Date: [id number] Attending:  Daisey Must CC:         Daisey Must, M.D. [facility]             E. Graceann Congress, M.D. [facility]             [facility]             Cardiac Catheterization Lab                        Cardiac Catheterization  PROCEDURES PERFORMED: 1. Left heart catheterization. 2. Coronary angiography. 3. Left ventriculography.  INDICATIONS:  [name] is a 59 year old woman who has had recurrent chest pain.  She was referred for cardiac catheterization to rule out coronary artery disease.  PROCEDURAL NOTE:  A 6-French sheath was placed in the right femoral artery. Standard Judkins 6-French catheters were utilized.  CONTRAST:  Omnipaque.  COMPLICATIONS:  None.  RESULTS:  HEMODYNAMICS: 1. Left ventricular pressure:  110/12. 2. Aortic pressure:  110/66.  There was no aortic valve gradient.  LEFT VENTRICULOGRAM:  Wall motion is normal.  Ejection fraction greater than 65%.  Trace mitral regurgitation.  CORONARY ANGIOGRAPHY: (Right dominant). 1. LEFT MAIN:  Normal. 2. LEFT ANTERIOR DESCENDING:  Has a tubular, somewhat irregular 30-40%    stenosis in the mid vessel.  The LAD gives rise to a small first diagonal    and a normal-sized second diagonal. 3. LEFT CIRCUMFLEX:  Gives rise to a small first marginal branch, a small    second marginal branch, and a large third marginal branch.  The left    circumflex is free of angiographic disease. 4. RIGHT CORONARY ARTERY:  Gives rise to a normal-sized posterior descending    artery and a small posterolateral branch.  The right coronary artery is    free of angiographic disease.  IMPRESSIONS: 1. Normal left ventricular systolic function. 2. Nonobstructive coronary artery disease.   PLAN: DD:  09/16/99 TD:  09/16/99 Job: [id number]

## 2010-08-20 ENCOUNTER — Encounter: Payer: Self-pay | Admitting: Internal Medicine

## 2010-11-05 ENCOUNTER — Emergency Department (HOSPITAL_COMMUNITY): Payer: Medicare Other

## 2010-11-05 ENCOUNTER — Emergency Department (HOSPITAL_COMMUNITY)
Admission: EM | Admit: 2010-11-05 | Discharge: 2010-11-05 | Disposition: A | Discharge status: Home / Self Care | Payer: Medicare Other | Attending: Emergency Medicine | Admitting: Emergency Medicine

## 2010-11-05 DIAGNOSIS — R51 Headache: Secondary | ICD-10-CM | POA: Insufficient documentation

## 2010-11-05 DIAGNOSIS — Z79899 Other long term (current) drug therapy: Secondary | ICD-10-CM | POA: Insufficient documentation

## 2010-11-05 DIAGNOSIS — M545 Low back pain, unspecified: Secondary | ICD-10-CM | POA: Insufficient documentation

## 2010-11-05 DIAGNOSIS — IMO0001 Reserved for inherently not codable concepts without codable children: Secondary | ICD-10-CM | POA: Insufficient documentation

## 2010-11-05 DIAGNOSIS — Z21 Asymptomatic human immunodeficiency virus [HIV] infection status: Secondary | ICD-10-CM | POA: Insufficient documentation

## 2010-11-05 DIAGNOSIS — W010XXA Fall on same level from slipping, tripping and stumbling without subsequent striking against object, initial encounter: Secondary | ICD-10-CM | POA: Insufficient documentation

## 2010-11-05 DIAGNOSIS — E7800 Pure hypercholesterolemia, unspecified: Secondary | ICD-10-CM | POA: Insufficient documentation

## 2010-11-05 DIAGNOSIS — S1093XA Contusion of unspecified part of neck, initial encounter: Secondary | ICD-10-CM | POA: Insufficient documentation

## 2010-11-05 DIAGNOSIS — S0003XA Contusion of scalp, initial encounter: Secondary | ICD-10-CM | POA: Insufficient documentation

## 2010-11-05 DIAGNOSIS — M25559 Pain in unspecified hip: Secondary | ICD-10-CM | POA: Insufficient documentation

## 2010-12-08 LAB — URINALYSIS, ROUTINE W REFLEX MICROSCOPIC
Bilirubin Urine: NEGATIVE
Glucose, UA: NEGATIVE
Protein, ur: 100 — AB
Urobilinogen, UA: 1

## 2010-12-08 LAB — URINE MICROSCOPIC-ADD ON

## 2010-12-08 LAB — URINE CULTURE

## 2010-12-12 ENCOUNTER — Encounter: Payer: Self-pay | Admitting: Cardiology

## 2010-12-12 DIAGNOSIS — IMO0002 Reserved for concepts with insufficient information to code with codable children: Secondary | ICD-10-CM | POA: Insufficient documentation

## 2010-12-12 DIAGNOSIS — Z21 Asymptomatic human immunodeficiency virus [HIV] infection status: Secondary | ICD-10-CM | POA: Insufficient documentation

## 2010-12-12 DIAGNOSIS — K219 Gastro-esophageal reflux disease without esophagitis: Secondary | ICD-10-CM | POA: Insufficient documentation

## 2010-12-12 DIAGNOSIS — I2510 Atherosclerotic heart disease of native coronary artery without angina pectoris: Secondary | ICD-10-CM | POA: Insufficient documentation

## 2010-12-12 DIAGNOSIS — R002 Palpitations: Secondary | ICD-10-CM | POA: Insufficient documentation

## 2010-12-12 DIAGNOSIS — E785 Hyperlipidemia, unspecified: Secondary | ICD-10-CM | POA: Insufficient documentation

## 2010-12-12 DIAGNOSIS — R001 Bradycardia, unspecified: Secondary | ICD-10-CM | POA: Insufficient documentation

## 2010-12-12 DIAGNOSIS — R0602 Shortness of breath: Secondary | ICD-10-CM | POA: Insufficient documentation

## 2010-12-12 DIAGNOSIS — K279 Peptic ulcer, site unspecified, unspecified as acute or chronic, without hemorrhage or perforation: Secondary | ICD-10-CM | POA: Insufficient documentation

## 2010-12-14 ENCOUNTER — Ambulatory Visit: Payer: Self-pay | Admitting: Cardiology

## 2010-12-20 ENCOUNTER — Ambulatory Visit (INDEPENDENT_AMBULATORY_CARE_PROVIDER_SITE_OTHER): Payer: Medicare Other | Admitting: Cardiology

## 2010-12-20 ENCOUNTER — Encounter: Payer: Self-pay | Admitting: Cardiology

## 2010-12-20 DIAGNOSIS — I2510 Atherosclerotic heart disease of native coronary artery without angina pectoris: Secondary | ICD-10-CM

## 2010-12-20 DIAGNOSIS — R001 Bradycardia, unspecified: Secondary | ICD-10-CM

## 2010-12-20 DIAGNOSIS — I498 Other specified cardiac arrhythmias: Secondary | ICD-10-CM

## 2010-12-20 DIAGNOSIS — R0602 Shortness of breath: Secondary | ICD-10-CM

## 2010-12-20 DIAGNOSIS — R002 Palpitations: Secondary | ICD-10-CM

## 2010-12-20 DIAGNOSIS — R072 Precordial pain: Secondary | ICD-10-CM

## 2010-12-20 NOTE — Assessment & Plan Note (Signed)
Patient has minimal coronary disease by history.  There was question of coronary artery spasm.  She is stable.  She's not having symptoms.  She does not need any further workup at this time.  I'll see her back in one year.

## 2010-12-20 NOTE — Progress Notes (Signed)
HPI Patient was seen for cardiology followup.  I saw her last October, 2011.  We know that by catheterization she has minimal coronary disease in the past.  He was thought that she might have a component of coronary artery spasm causing some chest pain over time.  She has been stable.  She has not had any significant chest pain.  She gets the majority of her care at [facility].  The patient fell in August, 2012.  She she was falling.  This was not syncope.  However she did hit her head and she does not remember being on the ground.  She was evaluated and her CT scan revealed no obvious bleed.  She did injure her coccyx.  She still has some soreness and walks with a walker.  As part of today's evaluation I have fully reviewed all vital records and updated the new EMR.   Allergies  Allergen Reactions  . Sulfamethoxazole W/Trimethoprim     Current Outpatient Prescriptions  Medication Sig Dispense Refill  . albuterol (VENTOLIN HFA) 108 (90 BASE) MCG/ACT inhaler Inhale 2 puffs into the lungs.        Marland Kitchen amitriptyline (ELAVIL) 50 MG tablet Take 50 mg by mouth at bedtime.        Marland Kitchen aspirin 81 MG tablet Take 81 mg by mouth daily.        Marland Kitchen atazanavir (REYATAZ) 300 MG capsule Take 300 mg by mouth daily with breakfast.        . budesonide-formoterol (SYMBICORT) 160-4.5 MCG/ACT inhaler Inhale 2 puffs into the lungs 3 (three) times daily.        . cyclobenzaprine (FLEXERIL) 10 MG tablet Take 10 mg by mouth 2 (two) times daily as needed.        . dapsone 25 MG tablet Take 25 mg by mouth daily.        Marland Kitchen emtricitabine-tenofovir (TRUVADA) 200-300 MG per tablet Take 1 tablet by mouth daily.        . fenofibrate (TRICOR) 145 MG tablet Take 145 mg by mouth daily.        Marland Kitchen omeprazole (PRILOSEC) 20 MG capsule Take 20 mg by mouth daily.        . pravastatin (PRAVACHOL) 80 MG tablet Take 80 mg by mouth daily.        . ritonavir (NORVIR) 100 MG capsule Take by mouth daily.          History    Social History  . Marital Status: Widowed    Spouse Name: N/A    Number of Children: 3  . Years of Education: N/A   Occupational History  . full-time [employer]   Social History Main Topics  . Smoking status: Former Smoker    Quit date: 03/13/1996  . Smokeless tobacco: Not on file  . Alcohol Use: No  . Drug Use: No  . Sexually Active: Not on file   Other Topics Concern  . Not on file   Social History Narrative  . No narrative on file    Family History  Problem Relation Age of Onset  . Coronary artery disease      family hx of on mother, father and siblings  . Breast cancer Sister   . Diabetes Mother     Past Medical History  Diagnosis Date  . HIV positive     Needle stick 1993  . CAD (coronary artery disease)     Minimal by history, Question coronary artery  spasm  . Palpitations   . Dyslipidemia   . Weight loss   . Asthma   . Peptic ulcer disease   . GERD (gastroesophageal reflux disease)     Esophageal stricture  . Colon polyps   . Bradycardia     Sinus bradycardia  . Shortness of breath     Extensive evaluation Dr. Mosetta Anis, [facility], May, 2010, etiology uncertain, probable bronchiolitis possibility of lung biopsy at that time  . Hyperlipidemia   . H/O: hysterectomy     Past Surgical History  Procedure Date  . Appendectomy   . Cholecystectomy   . Ventral hernia repair   . Gastrectomy     ROS  Patient denies fever, chills, headache, sweats, rash, change in vision, change in hearing, chest pain, cough, nausea vomiting, urinary symptoms.  All other systems are reviewed and are negative.  PHYSICAL EXAM Patient is stable today.  Head is atraumatic.  There is no xanthelasma.  No carotid bruits.  There is no jugular venous distention.  Lungs revealed no rales.  There is no respiratory distress.  Cardiac exam reveals S1-S2.  No clicks or significant murmurs.  The abdomen is soft.  No peripheral edema.  There are no  musculoskeletal deformities.  There no skin rashes.  She is walking with a walker today. Filed Vitals:   12/20/10 1358  BP: 114/78  Pulse: 75  Height: 5' (1.524 m)  Weight: 145 lb 12.8 oz (66.134 kg)    EKG is done today and reviewed by me.She has old decreased anterior R wave with some nonspecific ST-T wave changes.  There is no significant change since October, 2011.  ASSESSMENT & PLAN

## 2010-12-20 NOTE — Assessment & Plan Note (Signed)
She is not having any symptomatic bradycardia.  No further workup

## 2010-12-20 NOTE — Assessment & Plan Note (Signed)
She has not had any significant palpitations.  No further workup.

## 2010-12-20 NOTE — Patient Instructions (Signed)
Your physician wants you to follow-up in: YEAR WITH  DR KATZ You will receive a reminder letter in the mail two months in advance. If you don't receive a letter, please call our office to schedule the follow-up appointment. Your physician recommends that you continue on your current medications as directed. Please refer to the Current Medication list given to you today. 

## 2010-12-20 NOTE — Assessment & Plan Note (Signed)
Her shortness of breath is stable.  We have felt that this is not cardiac in the past.  She is followed in the pulmonary division at [facility].

## 2010-12-23 DIAGNOSIS — S3210XA Unspecified fracture of sacrum, initial encounter for closed fracture: Secondary | ICD-10-CM

## 2010-12-23 HISTORY — DX: Unspecified fracture of sacrum, initial encounter for closed fracture: S32.10XA

## 2011-02-19 ENCOUNTER — Encounter (HOSPITAL_COMMUNITY): Payer: Self-pay | Admitting: *Deleted

## 2011-02-19 ENCOUNTER — Emergency Department (HOSPITAL_COMMUNITY)
Admission: EM | Admit: 2011-02-19 | Discharge: 2011-02-19 | Disposition: A | Discharge status: Home / Self Care | Payer: Worker's Compensation | Attending: Emergency Medicine | Admitting: Emergency Medicine

## 2011-02-19 ENCOUNTER — Emergency Department (HOSPITAL_COMMUNITY): Payer: Worker's Compensation

## 2011-02-19 DIAGNOSIS — K219 Gastro-esophageal reflux disease without esophagitis: Secondary | ICD-10-CM | POA: Insufficient documentation

## 2011-02-19 DIAGNOSIS — J45909 Unspecified asthma, uncomplicated: Secondary | ICD-10-CM | POA: Insufficient documentation

## 2011-02-19 DIAGNOSIS — Z79899 Other long term (current) drug therapy: Secondary | ICD-10-CM | POA: Insufficient documentation

## 2011-02-19 DIAGNOSIS — I2510 Atherosclerotic heart disease of native coronary artery without angina pectoris: Secondary | ICD-10-CM | POA: Insufficient documentation

## 2011-02-19 DIAGNOSIS — Y990 Civilian activity done for income or pay: Secondary | ICD-10-CM | POA: Insufficient documentation

## 2011-02-19 DIAGNOSIS — E785 Hyperlipidemia, unspecified: Secondary | ICD-10-CM | POA: Insufficient documentation

## 2011-02-19 DIAGNOSIS — W010XXA Fall on same level from slipping, tripping and stumbling without subsequent striking against object, initial encounter: Secondary | ICD-10-CM | POA: Insufficient documentation

## 2011-02-19 DIAGNOSIS — Z21 Asymptomatic human immunodeficiency virus [HIV] infection status: Secondary | ICD-10-CM | POA: Insufficient documentation

## 2011-02-19 DIAGNOSIS — R42 Dizziness and giddiness: Secondary | ICD-10-CM | POA: Insufficient documentation

## 2011-02-19 DIAGNOSIS — S0003XA Contusion of scalp, initial encounter: Secondary | ICD-10-CM | POA: Insufficient documentation

## 2011-02-19 DIAGNOSIS — R51 Headache: Secondary | ICD-10-CM | POA: Insufficient documentation

## 2011-02-19 DIAGNOSIS — Z7982 Long term (current) use of aspirin: Secondary | ICD-10-CM | POA: Insufficient documentation

## 2011-02-19 DIAGNOSIS — W19XXXA Unspecified fall, initial encounter: Secondary | ICD-10-CM

## 2011-02-19 MED ORDER — HYDROCODONE-ACETAMINOPHEN 5-325 MG PO TABS
1.0000 | ORAL_TABLET | Freq: Once | ORAL | Status: AC
  Administered 2011-02-19: 1 via ORAL
  Filled 2011-02-19: qty 1

## 2011-02-19 NOTE — ED Provider Notes (Signed)
History     CSN: [id number] Arrival date & time: 02/19/2011 10:27 AM   First MD Initiated Contact with Patient 02/19/11 1057      Chief Complaint  Patient presents with  . Fall    (Consider location/radiation/quality/duration/timing/severity/associated sxs/prior treatment) Patient is a 60 y.o. female presenting with fall. The history is provided by the patient.  Fall Pertinent negatives include no fever, no numbness and no abdominal pain.  s/p slip and fall at work this am. Hit head. No loc, transiently dizzye afterwards. No nv. No faintness or dizziness prior to fall. No syncope. No neck or back pain. No numbness/weakness. No anticoag use. Denies other injury. No cp or abd pain. No ext pain/injury.   Past Medical History  Diagnosis Date  . HIV positive     Needle stick 1993  . CAD (coronary artery disease)     Minimal by history, Question coronary artery spasm  . Palpitations   . Dyslipidemia   . Weight loss   . Asthma   . Peptic ulcer disease   . GERD (gastroesophageal reflux disease)     Esophageal stricture  . Colon polyps   . Bradycardia     Sinus bradycardia  . Shortness of breath     Extensive evaluation Dr. Mosetta Anis, [facility], May, 2010, etiology uncertain, probable bronchiolitis possibility of lung biopsy at that time  . Hyperlipidemia   . H/O: hysterectomy     Past Surgical History  Procedure Date  . Appendectomy   . Cholecystectomy   . Ventral hernia repair   . Gastrectomy     Family History  Problem Relation Age of Onset  . Coronary artery disease      family hx of on mother, father and siblings  . Breast cancer Sister   . Diabetes Mother     History  Substance Use Topics  . Smoking status: Former Smoker    Quit date: 03/13/1996  . Smokeless tobacco: Not on file  . Alcohol Use: No    OB History    Grav Para Term Preterm Abortions TAB SAB Ect Mult Living                  Review of Systems  Constitutional: Negative for fever and  chills.  HENT: Negative for neck pain.   Eyes: Negative for redness.  Respiratory: Negative for shortness of breath.   Cardiovascular: Negative for chest pain.  Gastrointestinal: Negative for abdominal pain.  Genitourinary: Negative for flank pain.  Musculoskeletal: Negative for back pain.  Skin: Negative for rash.  Neurological: Negative for syncope, weakness and numbness.  Hematological: Does not bruise/bleed easily.  Psychiatric/Behavioral: Negative for confusion.    Allergies  Sulfamethoxazole w/trimethoprim  Home Medications   Current Outpatient Rx  Name Route Sig Dispense Refill  . ALBUTEROL SULFATE HFA 108 (90 BASE) MCG/ACT IN AERS Inhalation Inhale 2 puffs into the lungs.      . AMITRIPTYLINE HCL 50 MG PO TABS Oral Take 50 mg by mouth at bedtime.      . ASPIRIN 81 MG PO TABS Oral Take 81 mg by mouth daily.      . ATAZANAVIR SULFATE 300 MG PO CAPS Oral Take 300 mg by mouth daily with breakfast.      . BUDESONIDE-FORMOTEROL FUMARATE 160-4.5 MCG/ACT IN AERO Inhalation Inhale 2 puffs into the lungs 3 (three) times daily.      . CYCLOBENZAPRINE HCL 10 MG PO TABS Oral Take 10 mg by mouth 2 (two) times daily  as needed.      Marland Kitchen DAPSONE 25 MG PO TABS Oral Take 25 mg by mouth daily.      Marland Kitchen EMTRICITABINE-TENOFOVIR 200-300 MG PO TABS Oral Take 1 tablet by mouth daily.      . FENOFIBRATE 145 MG PO TABS Oral Take 145 mg by mouth daily.      Marland Kitchen LIDOCAINE VISCOUS 2 % MT SOLN Oral Take 20 mLs by mouth every 3 (three) hours as needed.      Marland Kitchen RITONAVIR 100 MG PO CAPS Oral Take by mouth daily.      . TRAMADOL HCL 50 MG PO TABS Oral Take 50 mg by mouth every 6 (six) hours as needed. Maximum dose= 8 tablets per day       BP 137/71  Pulse 89  Temp(Src) 98.7 F (37.1 C) (Oral)  Resp 14  SpO2 99%  Physical Exam  Nursing note and vitals reviewed. Constitutional: She is oriented to person, place, and time. She appears well-developed and well-nourished. No distress.  HENT:       Tenderness  scalp  Eyes: Conjunctivae are normal. Pupils are equal, round, and reactive to light. No scleral icterus.  Neck: Neck supple. No tracheal deviation present.       Spine non tender  Cardiovascular: Normal rate, regular rhythm, normal heart sounds and intact distal pulses.   Pulmonary/Chest: Effort normal and breath sounds normal. No respiratory distress.  Abdominal: Soft. Normal appearance and bowel sounds are normal. She exhibits no distension. There is no tenderness.  Musculoskeletal: She exhibits no edema and no tenderness.  Neurological: She is alert and oriented to person, place, and time.       Motor intact bil  Skin: Skin is warm and dry. No rash noted.  Psychiatric: She has a normal mood and affect.    ED Course  Procedures (including critical care time)     MDM  cts ordered from triage.   cts neg for acute injury. vicodin 1 po.         Suzi Roots, MD 02/19/11 [phone]

## 2011-02-19 NOTE — ED Notes (Signed)
To ed for eval after slipping and fall at work. States she hit her head. No loc but states she was dizzy after the fall. No nausea or vomiting noted. Ambulatory into triage.

## 2011-02-21 ENCOUNTER — Telehealth (HOSPITAL_COMMUNITY): Payer: Self-pay | Admitting: *Deleted

## 2011-03-20 DIAGNOSIS — M810 Age-related osteoporosis without current pathological fracture: Secondary | ICD-10-CM | POA: Insufficient documentation

## 2011-09-08 DIAGNOSIS — D2120 Benign neoplasm of connective and other soft tissue of unspecified lower limb, including hip: Secondary | ICD-10-CM | POA: Insufficient documentation

## 2011-12-07 ENCOUNTER — Encounter: Payer: Self-pay | Admitting: Internal Medicine

## 2011-12-25 ENCOUNTER — Encounter: Payer: Self-pay | Admitting: Cardiology

## 2011-12-25 ENCOUNTER — Ambulatory Visit (INDEPENDENT_AMBULATORY_CARE_PROVIDER_SITE_OTHER): Payer: Medicare Other | Admitting: Cardiology

## 2011-12-25 VITALS — BP 109/86 | HR 82 | Ht 60.0 in | Wt 143.0 lb

## 2011-12-25 DIAGNOSIS — R002 Palpitations: Secondary | ICD-10-CM

## 2011-12-25 DIAGNOSIS — J8410 Pulmonary fibrosis, unspecified: Secondary | ICD-10-CM

## 2011-12-25 DIAGNOSIS — I2510 Atherosclerotic heart disease of native coronary artery without angina pectoris: Secondary | ICD-10-CM

## 2011-12-25 NOTE — Progress Notes (Signed)
Patient ID: [name] [name], female   DOB: Apr 16, 1950, 61 y.o.   MRN: [id number]   HPI  Patient is seen to follow up her cardiac status. Several years ago she had chest discomfort and catheterization was done. She had only mild disease. There was question of coronary spasm. I have followed her over time to be sure that there was not any ongoing significant coronary ischemia. She's done well. She has a long history of shortness of breath it has been managed in stable. It is not felt to be cardiac. She's doing well at this time.  Allergies  Allergen Reactions  . Sulfamethoxazole W-Trimethoprim Hives    Current Outpatient Prescriptions  Medication Sig Dispense Refill  . albuterol (VENTOLIN HFA) 108 (90 BASE) MCG/ACT inhaler Inhale 2 puffs into the lungs.        Marland Kitchen amitriptyline (ELAVIL) 50 MG tablet Take 50 mg by mouth at bedtime.        Marland Kitchen aspirin 81 MG tablet Take 81 mg by mouth daily.        . budesonide-formoterol (SYMBICORT) 160-4.5 MCG/ACT inhaler Inhale 2 puffs into the lungs 3 (three) times daily.        . cyclobenzaprine (FLEXERIL) 10 MG tablet Take 10 mg by mouth 2 (two) times daily as needed.        . dapsone 25 MG tablet Take 25 mg by mouth daily.        Marland Kitchen emtricitabine-tenofovir (TRUVADA) 200-300 MG per tablet Take 1 tablet by mouth daily.        . fenofibrate (TRICOR) 145 MG tablet Take 145 mg by mouth daily.        Marland Kitchen lidocaine (XYLOCAINE) 2 % solution Take 20 mLs by mouth every 3 (three) hours as needed.        . raltegravir (ISENTRESS) 400 MG tablet Take 400 mg by mouth 2 (two) times daily.      . traMADol (ULTRAM) 50 MG tablet Take 50 mg by mouth every 6 (six) hours as needed. Maximum dose= 8 tablets per day         History   Social History  . Marital Status: Widowed    Spouse Name: N/A    Number of Children: 3  . Years of Education: N/A   Occupational History  . full-time [employer]   Social History Main Topics  . Smoking status: Former  Smoker    Quit date: 03/13/1996  . Smokeless tobacco: Not on file  . Alcohol Use: No  . Drug Use: No  . Sexually Active: Not on file   Other Topics Concern  . Not on file   Social History Narrative  . No narrative on file    Family History  Problem Relation Age of Onset  . Coronary artery disease      family hx of on mother, father and siblings  . Breast cancer Sister   . Diabetes Mother     Past Medical History  Diagnosis Date  . HIV positive     Needle stick 1993  . CAD (coronary artery disease)     Minimal by history, Question coronary artery spasm  . Palpitations   . Dyslipidemia   . Weight loss   . Asthma   . Peptic ulcer disease   . GERD (gastroesophageal reflux disease)     Esophageal stricture  . Colon polyps   . Bradycardia     Sinus bradycardia  . Shortness of breath  Extensive evaluation Dr. Mosetta Anis, [facility], May, 2010, etiology uncertain, probable bronchiolitis possibility of lung biopsy at that time  . Hyperlipidemia   . H/O: hysterectomy     Past Surgical History  Procedure Date  . Appendectomy   . Cholecystectomy   . Ventral hernia repair   . Gastrectomy     Patient Active Problem List  Diagnosis  . PULMONARY FIBROSIS  . CONSTIPATION  . WEIGHT LOSS-ABNORMAL  . ABDOMINAL PAIN-GENERALIZED  . PEPTIC ULCER DISEASE  . HIV positive  . CAD (coronary artery disease)  . Palpitations  . Dyslipidemia  . Asthma  . Peptic ulcer disease  . GERD (gastroesophageal reflux disease)  . Bradycardia  . Shortness of breath    ROS   Patient denies fever, chills, headache, sweats, rash, change in vision, change in hearing, chest pain, cough, nausea vomiting, urinary symptoms. All other systems are reviewed and are negative.  PHYSICAL EXAM  Patient is oriented to person time and place. Affect is normal. There is no jugular venous distention. Lungs are clear. Respiratory effort is nonlabored. Cardiac exam reveals S1 and S2. There no clicks or  significant murmurs. Abdomen is soft. There is no peripheral edema.  Filed Vitals:   12/25/11 1415  BP: 109/86  Pulse: 82  Height: 5' (1.524 m)  Weight: 143 lb (64.864 kg)   EKG is done today and reviewed by me. There are ST changes in the anterior leads. These are all. There is no new change.  ASSESSMENT & PLAN

## 2011-12-25 NOTE — Assessment & Plan Note (Signed)
Patient's lung disease is followed at [facility]. She is stable. No further workup.

## 2011-12-25 NOTE — Assessment & Plan Note (Signed)
She is not having any significant palpitations. No change in therapy. 

## 2011-12-25 NOTE — Patient Instructions (Addendum)
Your physician wants you to follow-up in: 1 year. You will receive a reminder letter in the mail two months in advance. If you don't receive a letter, please call our office to schedule the follow-up appointment.  

## 2011-12-25 NOTE — Assessment & Plan Note (Signed)
Patient had only slight coronary disease in the past. There was question of coronary spasm. She is stable. No further workup is needed.

## 2012-02-28 DIAGNOSIS — R32 Unspecified urinary incontinence: Secondary | ICD-10-CM | POA: Insufficient documentation

## 2012-04-02 DIAGNOSIS — N8110 Cystocele, unspecified: Secondary | ICD-10-CM | POA: Insufficient documentation

## 2012-10-23 DIAGNOSIS — G4700 Insomnia, unspecified: Secondary | ICD-10-CM | POA: Insufficient documentation

## 2012-12-11 DIAGNOSIS — IMO0002 Reserved for concepts with insufficient information to code with codable children: Secondary | ICD-10-CM | POA: Insufficient documentation

## 2012-12-20 ENCOUNTER — Encounter: Payer: Self-pay | Admitting: Cardiology

## 2012-12-20 DIAGNOSIS — R9430 Abnormal result of cardiovascular function study, unspecified: Secondary | ICD-10-CM | POA: Insufficient documentation

## 2012-12-24 ENCOUNTER — Ambulatory Visit (INDEPENDENT_AMBULATORY_CARE_PROVIDER_SITE_OTHER): Payer: Medicare Other | Admitting: Cardiology

## 2012-12-24 ENCOUNTER — Encounter: Payer: Self-pay | Admitting: Cardiology

## 2012-12-24 VITALS — BP 106/70 | HR 67 | Ht 60.0 in | Wt 134.0 lb

## 2012-12-24 DIAGNOSIS — R001 Bradycardia, unspecified: Secondary | ICD-10-CM

## 2012-12-24 DIAGNOSIS — Z01810 Encounter for preprocedural cardiovascular examination: Secondary | ICD-10-CM

## 2012-12-24 DIAGNOSIS — R9430 Abnormal result of cardiovascular function study, unspecified: Secondary | ICD-10-CM

## 2012-12-24 DIAGNOSIS — R0989 Other specified symptoms and signs involving the circulatory and respiratory systems: Secondary | ICD-10-CM

## 2012-12-24 DIAGNOSIS — IMO0002 Reserved for concepts with insufficient information to code with codable children: Secondary | ICD-10-CM

## 2012-12-24 DIAGNOSIS — Z21 Asymptomatic human immunodeficiency virus [HIV] infection status: Secondary | ICD-10-CM

## 2012-12-24 DIAGNOSIS — R0602 Shortness of breath: Secondary | ICD-10-CM

## 2012-12-24 DIAGNOSIS — R002 Palpitations: Secondary | ICD-10-CM

## 2012-12-24 DIAGNOSIS — I498 Other specified cardiac arrhythmias: Secondary | ICD-10-CM

## 2012-12-24 NOTE — Assessment & Plan Note (Signed)
Patient is followed carefully for her pulmonary status at [facility].

## 2012-12-24 NOTE — Assessment & Plan Note (Addendum)
The patient's cardiac status is stable. She does not need any further cardiac testing. She can be cleared for her bladder surgery in January, 2015. As part of today's evaluation I have reviewed all of the information concerning her cardiac status and discuss this with her. She is cleared for her bladder surgery.

## 2012-12-24 NOTE — Assessment & Plan Note (Signed)
Heart rate today is 67. She has not had any symptomatic sinus bradycardia.

## 2012-12-24 NOTE — Assessment & Plan Note (Signed)
The patient has normal left ventricular function in the past. She does not need a followup echo at this time.

## 2012-12-24 NOTE — Assessment & Plan Note (Signed)
She is not having any significant palpitations. 

## 2012-12-24 NOTE — Progress Notes (Signed)
[facility]:  HPI  Patient is seen for cardiology followup. This is a 1 year followup visit. In addition it is a visit as a preop cardiac evaluation for bladder surgery in January, 2015. In the past the patient had minimal coronary disease. There was also question of some coronary spasm. She's been very stable. She is not having any significant chest pain. Her exercise level is good. She works regularly.  Allergies  Allergen Reactions  . Sulfamethoxazole-Trimethoprim Hives    Current Outpatient Prescriptions  Medication Sig Dispense Refill  . albuterol (VENTOLIN HFA) 108 (90 BASE) MCG/ACT inhaler Inhale 2 puffs into the lungs.        Marland Kitchen amitriptyline (ELAVIL) 50 MG tablet Take 50 mg by mouth at bedtime.        Marland Kitchen aspirin 81 MG tablet Take 81 mg by mouth daily.        . budesonide-formoterol (SYMBICORT) 160-4.5 MCG/ACT inhaler Inhale 2 puffs into the lungs 3 (three) times daily.        . dapsone 25 MG tablet Take 25 mg by mouth daily.        Marland Kitchen emtricitabine-tenofovir (TRUVADA) 200-300 MG per tablet Take 1 tablet by mouth daily.        Marland Kitchen lidocaine (XYLOCAINE) 2 % solution Take 20 mLs by mouth every 3 (three) hours as needed.        . pravastatin (PRAVACHOL) 80 MG tablet Take 1 tablet by mouth daily.      . raltegravir (ISENTRESS) 400 MG tablet Take 400 mg by mouth 2 (two) times daily.      . traMADol (ULTRAM) 50 MG tablet Take 50 mg by mouth every 6 (six) hours as needed. Maximum dose= 8 tablets per day        No current facility-administered medications for this visit.    History   Social History  . Marital Status: Widowed    Spouse Name: N/A    Number of Children: 3  . Years of Education: N/A   Occupational History  . full-time [employer]   Social History Main Topics  . Smoking status: Former Smoker    Quit date: 03/13/1996  . Smokeless tobacco: Not on file  . Alcohol Use: No  . Drug Use: No  . Sexual Activity: Not on file   Other Topics  Concern  . Not on file   Social History Narrative  . No narrative on file    Family History  Problem Relation Age of Onset  . Coronary artery disease      family hx of on mother, father and siblings  . Breast cancer Sister   . Diabetes Mother     Past Medical History  Diagnosis Date  . HIV positive     Needle stick 1993  . CAD (coronary artery disease)     Minimal by history, Question coronary artery spasm  . Palpitations   . Dyslipidemia   . Weight loss   . Asthma   . Peptic ulcer disease   . GERD (gastroesophageal reflux disease)     Esophageal stricture  . Colon polyps   . Bradycardia     Sinus bradycardia  . Shortness of breath     Extensive evaluation Dr. Mosetta Anis, [facility], May, 2010, etiology uncertain, probable bronchiolitis possibility of lung biopsy at that time  . Hyperlipidemia   . H/O: hysterectomy   . Ejection fraction     Past Surgical History  Procedure Laterality Date  . Appendectomy    .  Cholecystectomy    . Ventral hernia repair    . Gastrectomy      Patient Active Problem List   Diagnosis Date Noted  . CAD (coronary artery disease)     Priority: High  . Palpitations     Priority: High  . Bradycardia     Priority: High  . Shortness of breath     Priority: High  . Preop cardiovascular exam 12/24/2012  . Ejection fraction   . HIV positive   . Dyslipidemia   . Asthma   . Peptic ulcer disease   . GERD (gastroesophageal reflux disease)   . PULMONARY FIBROSIS 07/06/2008  . CONSTIPATION 07/22/2007  . WEIGHT LOSS-ABNORMAL 07/22/2007  . ABDOMINAL PAIN-GENERALIZED 07/22/2007  . PEPTIC ULCER DISEASE 07/22/2007    ROS   Patient denies fever, chills, headache, sweats, rash, change in vision, change in hearing, chest pain, cough, nausea vomiting, urinary symptoms. All other systems are reviewed and are negative.  PHYSICAL EXAM   Patient is quite stable. There is no jugulovenous distention. Lungs are clear. Respiratory effort is  nonlabored. Cardiac exam reveals S1 and S2. There no clicks or significant murmurs. The abdomen is soft. There no musculoskeletal deformities. There are no skin rashes. The patient has adipose tissue in her lower legs. There is no significant edema.  Filed Vitals:   12/24/12 0842  BP: 106/70  Pulse: 67  Height: 5' (1.524 m)  Weight: 134 lb (60.782 kg)   EKG is done today and reviewed by me. There is sinus rhythm. She has inverted T waves in V1 and V2. There is decreased anterior R wave progression. This is old. There is no significant change from the tracing that I compared from one year ago.  ASSESSMENT & PLAN

## 2012-12-24 NOTE — Assessment & Plan Note (Signed)
Patient receives appropriate medicines for this.

## 2012-12-24 NOTE — Patient Instructions (Signed)
Your physician recommends that you continue on your current medications as directed. Please refer to the Current Medication list given to you today.  Your physician wants you to follow-up in: 1 year. You will receive a reminder letter in the mail two months in advance. If you don't receive a letter, please call our office to schedule the follow-up appointment.  

## 2013-03-25 DIAGNOSIS — Z9889 Other specified postprocedural states: Secondary | ICD-10-CM | POA: Insufficient documentation

## 2013-07-14 DIAGNOSIS — J309 Allergic rhinitis, unspecified: Secondary | ICD-10-CM | POA: Insufficient documentation

## 2013-08-25 DIAGNOSIS — M5416 Radiculopathy, lumbar region: Secondary | ICD-10-CM | POA: Insufficient documentation

## 2013-08-25 DIAGNOSIS — M898X9 Other specified disorders of bone, unspecified site: Secondary | ICD-10-CM | POA: Insufficient documentation

## 2014-01-05 ENCOUNTER — Ambulatory Visit (INDEPENDENT_AMBULATORY_CARE_PROVIDER_SITE_OTHER): Payer: Medicare Other | Admitting: Cardiology

## 2014-01-05 ENCOUNTER — Encounter: Payer: Self-pay | Admitting: Cardiology

## 2014-01-05 VITALS — BP 110/62 | HR 69 | Ht 60.0 in | Wt 138.0 lb

## 2014-01-05 DIAGNOSIS — I2510 Atherosclerotic heart disease of native coronary artery without angina pectoris: Secondary | ICD-10-CM

## 2014-01-05 DIAGNOSIS — R001 Bradycardia, unspecified: Secondary | ICD-10-CM

## 2014-01-05 DIAGNOSIS — R002 Palpitations: Secondary | ICD-10-CM

## 2014-01-05 DIAGNOSIS — E785 Hyperlipidemia, unspecified: Secondary | ICD-10-CM

## 2014-01-05 DIAGNOSIS — Z21 Asymptomatic human immunodeficiency virus [HIV] infection status: Secondary | ICD-10-CM

## 2014-01-05 NOTE — Assessment & Plan Note (Signed)
Her lipids are being treated. No change in therapy. 

## 2014-01-05 NOTE — Progress Notes (Signed)
Patient ID: [name] [name], female   DOB: 06/30/1950, 63 y.o.   MRN: [id number]    HPI  Patient is seen today to follow-up a history of some chest pain and minimal coronary disease. Over time we thought she may have had some spasm. I saw her last October, 2014. I cleared her then to have bladder surgery in 2015. This has been done on 2 occasions and she is stable. Sometimes she has some chest discomfort that she feels in her chest and up to her left neck. He can stay with her for several days. It does not sound like ischemic cardiac pain.  Allergies  Allergen Reactions  . Sulfamethoxazole-Trimethoprim Hives    Current Outpatient Prescriptions  Medication Sig Dispense Refill  . albuterol (VENTOLIN HFA) 108 (90 BASE) MCG/ACT inhaler Inhale 2 puffs into the lungs.        Marland Kitchen amitriptyline (ELAVIL) 50 MG tablet Take 50 mg by mouth at bedtime.        Marland Kitchen aspirin 81 MG tablet Take 81 mg by mouth daily.        . budesonide-formoterol (SYMBICORT) 160-4.5 MCG/ACT inhaler Inhale 2 puffs into the lungs 3 (three) times daily.        . dapsone 25 MG tablet Take 25 mg by mouth daily.        Marland Kitchen emtricitabine-tenofovir (TRUVADA) 200-300 MG per tablet Take 1 tablet by mouth daily.        . pravastatin (PRAVACHOL) 80 MG tablet Take 1 tablet by mouth daily.      . raltegravir (ISENTRESS) 400 MG tablet Take 400 mg by mouth 2 (two) times daily.       No current facility-administered medications for this visit.    History   Social History  . Marital Status: Widowed    Spouse Name: N/A    Number of Children: 3  . Years of Education: N/A   Occupational History  . full-time [employer]   Social History Main Topics  . Smoking status: Former Smoker    Quit date: 03/13/1996  . Smokeless tobacco: Not on file  . Alcohol Use: No  . Drug Use: No  . Sexual Activity: Not on file   Other Topics Concern  . Not on file   Social History Narrative  . No narrative on file    Family  History  Problem Relation Age of Onset  . Coronary artery disease      family hx of on mother, father and siblings  . Breast cancer Sister   . Diabetes Mother     Past Medical History  Diagnosis Date  . HIV positive     Needle stick 1993  . CAD (coronary artery disease)     Minimal by history, Question coronary artery spasm  . Palpitations   . Dyslipidemia   . Weight loss   . Asthma   . Peptic ulcer disease   . GERD (gastroesophageal reflux disease)     Esophageal stricture  . Colon polyps   . Bradycardia     Sinus bradycardia  . Shortness of breath     Extensive evaluation Dr. Winona Legato, [facility], May, 3532, etiology uncertain, probable bronchiolitis possibility of lung biopsy at that time  . Hyperlipidemia   . H/O: hysterectomy   . Ejection fraction     Past Surgical History  Procedure Laterality Date  . Appendectomy    . Cholecystectomy    . Ventral hernia repair    . Gastrectomy  Patient Active Problem List   Diagnosis Date Noted  . CAD (coronary artery disease)     Priority: High  . Palpitations     Priority: High  . Bradycardia     Priority: High  . Shortness of breath     Priority: High  . Preop cardiovascular exam 12/24/2012  . Ejection fraction   . HIV positive   . Dyslipidemia   . Asthma   . Peptic ulcer disease   . GERD (gastroesophageal reflux disease)   . PULMONARY FIBROSIS 07/06/2008  . CONSTIPATION 07/22/2007  . WEIGHT LOSS-ABNORMAL 07/22/2007  . ABDOMINAL PAIN-GENERALIZED 07/22/2007    ROS   Patient denies fever, chills, headache, sweats, rash, change in vision, range in hearing, cough, nausea or vomiting, urinary symptoms. All other systems are reviewed and are negative.  PHYSICAL EXAM  Patient is oriented to person time and place. Affect is normal. She is quite stable today. Head is atraumatic. Sclera and conjunctiva are normal. Lungs are clear. Respiratory effort is not labored. There is no jugular venous distention. Cardiac exam  reveals S1 and S2. Abdomen is soft. There is no peripheral edema. There are no musculoskeletal deformities. There are no skin rashes.  Filed Vitals:   01/05/14 0844  BP: 110/62  Pulse: 69  Height: 5' (1.524 m)  Weight: 138 lb (62.596 kg)   EKG is done today and reviewed by me. There is sinus rhythm. She has old diffuse nonspecific ST-T wave changes. There is no change from the past.  ASSESSMENT & PLAN

## 2014-01-05 NOTE — Assessment & Plan Note (Signed)
In the past the patient had minimal coronary disease. We think that she may have had coronary artery spasm at one point. No further workup is needed at this time.

## 2014-01-05 NOTE — Patient Instructions (Signed)
Your physician recommends that you continue on your current medications as directed. Please refer to the Current Medication list given to you today.  Your physician wants you to follow-up in: 1 year. You will receive a reminder letter in the mail two months in advance. If you don't receive a letter, please call our office to schedule the follow-up appointment.  

## 2014-01-05 NOTE — Assessment & Plan Note (Signed)
Patient is HIV positive from a needle stick in 1993. Fortunately she is on appropriate medications and doing well.

## 2014-01-05 NOTE — Assessment & Plan Note (Signed)
She is not having any significant bradycardia.

## 2014-01-05 NOTE — Assessment & Plan Note (Signed)
She has only rare palpitations. No further workup.

## 2014-04-10 ENCOUNTER — Emergency Department (HOSPITAL_COMMUNITY): Payer: Medicare Other

## 2014-04-10 ENCOUNTER — Emergency Department (HOSPITAL_COMMUNITY)
Admission: EM | Admit: 2014-04-10 | Discharge: 2014-04-11 | Disposition: A | Discharge status: Home / Self Care | Payer: Medicare Other | Attending: Emergency Medicine | Admitting: Emergency Medicine

## 2014-04-10 ENCOUNTER — Encounter (HOSPITAL_COMMUNITY): Payer: Self-pay | Admitting: Physical Medicine and Rehabilitation

## 2014-04-10 DIAGNOSIS — Z79899 Other long term (current) drug therapy: Secondary | ICD-10-CM | POA: Insufficient documentation

## 2014-04-10 DIAGNOSIS — Z7951 Long term (current) use of inhaled steroids: Secondary | ICD-10-CM | POA: Diagnosis not present

## 2014-04-10 DIAGNOSIS — L03011 Cellulitis of right finger: Secondary | ICD-10-CM | POA: Diagnosis not present

## 2014-04-10 DIAGNOSIS — I2510 Atherosclerotic heart disease of native coronary artery without angina pectoris: Secondary | ICD-10-CM | POA: Insufficient documentation

## 2014-04-10 DIAGNOSIS — Z8719 Personal history of other diseases of the digestive system: Secondary | ICD-10-CM | POA: Insufficient documentation

## 2014-04-10 DIAGNOSIS — Z87891 Personal history of nicotine dependence: Secondary | ICD-10-CM | POA: Diagnosis not present

## 2014-04-10 DIAGNOSIS — Z21 Asymptomatic human immunodeficiency virus [HIV] infection status: Secondary | ICD-10-CM | POA: Diagnosis not present

## 2014-04-10 DIAGNOSIS — Z86010 Personal history of colonic polyps: Secondary | ICD-10-CM | POA: Insufficient documentation

## 2014-04-10 DIAGNOSIS — M79644 Pain in right finger(s): Secondary | ICD-10-CM | POA: Diagnosis present

## 2014-04-10 DIAGNOSIS — E785 Hyperlipidemia, unspecified: Secondary | ICD-10-CM | POA: Diagnosis not present

## 2014-04-10 DIAGNOSIS — Z7982 Long term (current) use of aspirin: Secondary | ICD-10-CM | POA: Diagnosis not present

## 2014-04-10 DIAGNOSIS — J449 Chronic obstructive pulmonary disease, unspecified: Secondary | ICD-10-CM | POA: Diagnosis not present

## 2014-04-10 LAB — COMPREHENSIVE METABOLIC PANEL
ALBUMIN: 4.1 g/dL (ref 3.5–5.2)
ALK PHOS: 110 U/L (ref 39–117)
ALT: 19 U/L (ref 0–35)
AST: 23 U/L (ref 0–37)
Anion gap: 7 (ref 5–15)
BILIRUBIN TOTAL: 0.4 mg/dL (ref 0.3–1.2)
BUN: 15 mg/dL (ref 6–23)
CHLORIDE: 107 mmol/L (ref 96–112)
CO2: 24 mmol/L (ref 19–32)
Calcium: 9 mg/dL (ref 8.4–10.5)
Creatinine, Ser: 0.88 mg/dL (ref 0.50–1.10)
GFR calc Af Amer: 80 mL/min — ABNORMAL LOW (ref >90)
GFR calc non Af Amer: 69 mL/min — ABNORMAL LOW (ref >90)
Glucose, Bld: 100 mg/dL — ABNORMAL HIGH (ref 70–99)
POTASSIUM: 3.9 mmol/L (ref 3.5–5.1)
Sodium: 138 mmol/L (ref 135–145)
Total Protein: 7.6 g/dL (ref 6.0–8.3)

## 2014-04-10 LAB — CBC WITH DIFFERENTIAL/PLATELET
BASOS ABS: 0 10*3/uL (ref 0.0–0.1)
BASOS PCT: 0 % (ref 0–1)
Eosinophils Absolute: 0 10*3/uL (ref 0.0–0.7)
Eosinophils Relative: 1 % (ref 0–5)
HCT: 42.7 % (ref 36.0–46.0)
HEMOGLOBIN: 14.6 g/dL (ref 12.0–15.0)
LYMPHS PCT: 21 % (ref 12–46)
Lymphs Abs: 1.5 10*3/uL (ref 0.7–4.0)
MCH: 32.2 pg (ref 26.0–34.0)
MCHC: 34.2 g/dL (ref 30.0–36.0)
MCV: 94.1 fL (ref 78.0–100.0)
MONOS PCT: 11 % (ref 3–12)
Monocytes Absolute: 0.8 10*3/uL (ref 0.1–1.0)
NEUTROS ABS: 5 10*3/uL (ref 1.7–7.7)
NEUTROS PCT: 67 % (ref 43–77)
Platelets: 277 10*3/uL (ref 150–400)
RBC: 4.54 MIL/uL (ref 3.87–5.11)
RDW: 13.1 % (ref 11.5–15.5)
WBC: 7.4 10*3/uL (ref 4.0–10.5)

## 2014-04-10 LAB — CBG MONITORING, ED: Glucose-Capillary: 87 mg/dL (ref 70–99)

## 2014-04-10 LAB — SEDIMENTATION RATE: Sed Rate: 12 mm/hr (ref 0–22)

## 2014-04-10 NOTE — ED Notes (Signed)
Pt presents to department for evaluation of R middle finger swelling and pain. Ongoing x1 week. Pt states yellow colored drainage, redness and painful to touch. 9/10 pain upon arrival to ED.

## 2014-04-10 NOTE — ED Provider Notes (Signed)
CSN: [id number]     Arrival date & time 04/10/14  1828 History  This chart was scribed for Abigail Butts, PA-C with Daleen Bo, MD by Edison Simon, ED Scribe. This patient was seen in room C27C/C27C and the patient's care was started at 8:43 PM.    Chief Complaint  Patient presents with  . Hand Pain   The history is provided by the patient and medical records. No language interpreter was used.    HPI Comments: [name] [name] is a 64 y.o. female who presents to the Emergency Department complaining of pain and swelling to her right middle finger with onset as pimples 2-3 weeks ago, worsening progressively. She states it has had purulent drainage for the past 4-5 days. She has history of HIV, CAD, asthma, COPD, and pulmonary fibrosis. Her HIV is followed by Dr. Sabra Heck at [facility] and she states she is compliant with her medications. She denies history of diabetes. She denies fevers or chills, vomiting or abdominal pain.  Past Medical History  Diagnosis Date  . HIV positive     Needle stick 1993  . CAD (coronary artery disease)     Minimal by history, Question coronary artery spasm  . Palpitations   . Dyslipidemia   . Weight loss   . Asthma   . Peptic ulcer disease   . GERD (gastroesophageal reflux disease)     Esophageal stricture  . Colon polyps   . Bradycardia     Sinus bradycardia  . Shortness of breath     Extensive evaluation Dr. Winona Legato, [facility], May, 9935, etiology uncertain, probable bronchiolitis possibility of lung biopsy at that time  . Hyperlipidemia   . H/O: hysterectomy   . Ejection fraction    Past Surgical History  Procedure Laterality Date  . Appendectomy    . Cholecystectomy    . Ventral hernia repair    . Gastrectomy     Family History  Problem Relation Age of Onset  . Coronary artery disease      family hx of on mother, father and siblings  . Breast cancer Sister   . Diabetes Mother    History  Substance Use Topics  . Smoking status: Former  Smoker    Quit date: 03/13/1996  . Smokeless tobacco: Not on file  . Alcohol Use: No   OB History    No data available     Review of Systems  Constitutional: Negative for fever and chills.  Gastrointestinal: Negative for nausea and vomiting.  Endocrine: Negative for polydipsia, polyphagia and polyuria.  Skin: Positive for color change and wound.       Abscess  Allergic/Immunologic: Negative for immunocompromised state.  Hematological: Does not bruise/bleed easily.  Psychiatric/Behavioral: The patient is not nervous/anxious.       Allergies  Sulfamethoxazole-trimethoprim  Home Medications   Prior to Admission medications   Medication Sig Start Date End Date Taking? Authorizing Provider  albuterol (VENTOLIN HFA) 108 (90 BASE) MCG/ACT inhaler Inhale 2 puffs into the lungs.      Historical Provider, MD  amitriptyline (ELAVIL) 50 MG tablet Take 50 mg by mouth at bedtime.      Historical Provider, MD  amoxicillin-clavulanate (AUGMENTIN) 875-125 MG per tablet Take 1 tablet by mouth 2 (two) times daily. One po bid x 7 days 04/11/14   Richarda Blade, MD  aspirin 81 MG tablet Take 81 mg by mouth daily.      Historical Provider, MD  budesonide-formoterol (SYMBICORT) 160-4.5 MCG/ACT inhaler Inhale  2 puffs into the lungs 3 (three) times daily.      Historical Provider, MD  dapsone 25 MG tablet Take 25 mg by mouth daily.      Historical Provider, MD  emtricitabine-tenofovir (TRUVADA) 200-300 MG per tablet Take 1 tablet by mouth daily.      Historical Provider, MD  oxyCODONE-acetaminophen (PERCOCET) 5-325 MG per tablet Take 1 tablet by mouth every 4 (four) hours as needed for severe pain. 04/11/14   Richarda Blade, MD  pravastatin (PRAVACHOL) 80 MG tablet Take 1 tablet by mouth daily. 12/12/12   Historical Provider, MD  raltegravir (ISENTRESS) 400 MG tablet Take 400 mg by mouth 2 (two) times daily.    Historical Provider, MD   BP 129/79 mmHg  Pulse 95  Temp(Src) 98.3 F (36.8 C) (Oral)   Resp 22  SpO2 94% Physical Exam  Constitutional: She is oriented to person, place, and time. She appears well-developed and well-nourished. No distress.  HENT:  Head: Normocephalic and atraumatic.  Eyes: Conjunctivae are normal. No scleral icterus.  Neck: Normal range of motion.  Cardiovascular: Normal rate, regular rhythm, normal heart sounds and intact distal pulses.   No murmur heard. Pulmonary/Chest: Effort normal and breath sounds normal.  Abdominal: Soft. She exhibits no distension. There is no tenderness.  Musculoskeletal:  Right middle finger with swelling and redness of the cuticle extending into the subungual space with detachment of the nail. Purulent drainage noted from the cuticle.  Extension of erythema to the DIP joint but no streaking beyond that point. Full range of motion of the finger with some pain. Full range of motion of all fingers of the right hand right wrist and right elbow.  Lymphadenopathy:    She has no cervical adenopathy.  Neurological: She is alert and oriented to person, place, and time.  Skin: Skin is warm and dry. She is not diaphoretic. There is erythema.  Psychiatric: She has a normal mood and affect.  Nursing note and vitals reviewed.   ED Course  Procedures (including critical care time)  DIAGNOSTIC STUDIES: Oxygen Saturation is 95% on room air, adequate by my interpretation.    COORDINATION OF CARE: 8:46 PM Discussed treatment plan with patient at beside, the patient agrees with the plan and has no further questions at this time.   Labs Review Labs Reviewed  COMPREHENSIVE METABOLIC PANEL - Abnormal; Notable for the following:    Glucose, Bld 100 (*)    GFR calc non Af Amer 69 (*)    GFR calc Af Amer 80 (*)    All other components within normal limits  CBC WITH DIFFERENTIAL/PLATELET  SEDIMENTATION RATE  CBG MONITORING, ED    Imaging Review Dg Finger Middle Right  04/10/2014   CLINICAL DATA:  Right middle finger pain. Question  infection. No trauma.  EXAM: RIGHT MIDDLE FINGER 2+V  COMPARISON:  None.  FINDINGS: Negative for fracture, dislocation or radiopaque foreign body. There is soft tissue swelling of the distal phalanx. No bony destruction is evident. Articulations are preserved.  IMPRESSION: Soft tissue swelling without bony abnormality. No radiopaque foreign body.   Electronically Signed   By: Andreas Newport M.D.   On: 04/10/2014 21:28     EKG Interpretation None      MDM   Final diagnoses:  Paronychia, right    [name] [name] presents with right middle finger swelling and redness at the distal portion of the finger. Patient is HIV positive. Unknown last CD4 count. Patient reports compliance with  her medications. Concern for possible osteomyelitis versus deep-seated abscess. Will obtain x-rays, labs.  No evidence of felon on exam. Doubt septic joint.  Labs reassuring. X-ray without evidence of osteomyelitis and normal sedimentation rate. Patient without leukocytosis or electrolyte abnormalities. She is alert, oriented, nontoxic and nonseptic appearing. She is afebrile and not tachycardic here in the emergency department.  The patient was discussed with and seen by Dr. Eulis Foster who recommends Percocet and Augmentin with warm soaks at home.  Patients symptoms consistent with paronychia, draining.    BP 129/79 mmHg  Pulse 95  Temp(Src) 98.3 F (36.8 C) (Oral)  Resp 22  SpO2 94%   Abigail Butts, PA-C 04/11/14 0248  Richarda Blade, MD 04/11/14 [phone]

## 2014-04-11 DIAGNOSIS — L03011 Cellulitis of right finger: Secondary | ICD-10-CM | POA: Diagnosis not present

## 2014-04-11 MED ORDER — OXYCODONE-ACETAMINOPHEN 5-325 MG PO TABS
1.0000 | ORAL_TABLET | Freq: Once | ORAL | Status: AC
  Administered 2014-04-11: 1 via ORAL
  Filled 2014-04-11: qty 1

## 2014-04-11 MED ORDER — AMOXICILLIN-POT CLAVULANATE 875-125 MG PO TABS
1.0000 | ORAL_TABLET | Freq: Once | ORAL | Status: AC
  Administered 2014-04-11: 1 via ORAL
  Filled 2014-04-11: qty 1

## 2014-04-11 MED ORDER — AMOXICILLIN-POT CLAVULANATE 875-125 MG PO TABS: 1.0000 | ORAL_TABLET | Freq: Two times a day (BID) | ORAL | Status: DC

## 2014-04-11 MED ORDER — OXYCODONE-ACETAMINOPHEN 5-325 MG PO TABS: 1.0000 | ORAL_TABLET | ORAL | Status: DC | PRN

## 2014-04-11 NOTE — Discharge Instructions (Signed)
Soak the right hand, in warm water for 30 minutes, every 3-4 hours while awake until the wound improves. Keep the wound clean with soap and water several times a day. Call your Dr. for a follow-up appointment in 3 or 4 days to ensure that you are improving.    Fingertip Infection When an infection is around the nail, it is called a paronychia. When it appears over the tip of the finger, it is called a felon. These infections are due to minor injuries or cracks in the skin. If they are not treated properly, they can lead to bone infection and permanent damage to the fingernail. Incision and drainage is necessary if a pus pocket (an abscess) has formed. Antibiotics and pain medicine may also be needed. Keep your hand elevated for the next 2-3 days to reduce swelling and pain. If a pack was placed in the abscess, it should be removed in 1-2 days by your caregiver. Soak the finger in warm water for 20 minutes 4 times daily to help promote drainage. Keep the hands as dry as possible. Wear protective gloves with cotton liners. See your caregiver for follow-up care as recommended.  HOME CARE INSTRUCTIONS   Keep wound clean, dry and dressed as suggested by your caregiver.  Soak in warm salt water for fifteen minutes, four times per day for bacterial infections.  Your caregiver will prescribe an antibiotic if a bacterial infection is suspected. Take antibiotics as directed and finish the prescription, even if the problem appears to be improving before the medicine is gone.  Only take over-the-counter or prescription medicines for pain, discomfort, or fever as directed by your caregiver. SEEK IMMEDIATE MEDICAL CARE IF:  There is redness, swelling, or increasing pain in the wound.  Pus or any other unusual drainage is coming from the wound.  An unexplained oral temperature above 102 F (38.9 C) develops.  You notice a foul smell coming from the wound or dressing. MAKE SURE YOU:   Understand these  instructions.  Monitor your condition.  Contact your caregiver if you are getting worse or not improving. Document Released: 04/06/2004 Document Revised: 05/22/2011 Document Reviewed: 04/02/2008 [facility] Patient Information 2015 [place], [place]. This information is not intended to replace advice given to you by your health care provider. Make sure you discuss any questions you have with your health care provider.

## 2014-04-11 NOTE — ED Provider Notes (Addendum)
  Face-to-face evaluation   History: Right finger 3 pain, swelling, and drainage for 3 weeks. It is worsening today. No fever or persistent vomiting.  It started with "small pimples."  Physical exam: Alert elderly female in mild pain. Right finger 3 with swelling and redness of cuticle, which extends to the subungal space and the nail is elevated, and nearly avulsed. Mild drainage from the cuticle. Normal AROM of the right 3rd finger. No proximal streaking.   Nursing Notes Reviewed/ Care Coordinated Applicable Imaging Reviewed Interpretation of Laboratory Data incorporated into ED treatment  The patient appears reasonably screened and/or stabilized for discharge and I doubt any other medical condition or other [facility] requiring further screening, evaluation, or treatment in the ED at this time prior to discharge.  Plan: Home Medications- Percocet, Augmentin; Home Treatments- Warm soaks; return here if the recommended treatment, does not improve the symptoms; Recommended follow up- PCP 3-4 days  Medical screening examination/treatment/procedure(s) were conducted as a shared visit with non-physician practitioner(s) and myself.  I personally evaluated the patient during the encounter     Richarda Blade, MD 04/11/14 [phone]

## 2014-06-16 DIAGNOSIS — IMO0002 Reserved for concepts with insufficient information to code with codable children: Secondary | ICD-10-CM | POA: Insufficient documentation

## 2014-07-06 DIAGNOSIS — J84112 Idiopathic pulmonary fibrosis: Secondary | ICD-10-CM | POA: Insufficient documentation

## 2014-12-07 ENCOUNTER — Ambulatory Visit: Payer: Self-pay | Admitting: Cardiology

## 2014-12-14 DIAGNOSIS — R1310 Dysphagia, unspecified: Secondary | ICD-10-CM | POA: Insufficient documentation

## 2015-02-01 ENCOUNTER — Encounter: Payer: Self-pay | Admitting: Cardiology

## 2015-02-01 ENCOUNTER — Ambulatory Visit (INDEPENDENT_AMBULATORY_CARE_PROVIDER_SITE_OTHER): Payer: Medicare Other | Admitting: Cardiology

## 2015-02-01 VITALS — BP 110/82 | HR 70 | Ht 60.0 in | Wt 141.2 lb

## 2015-02-01 DIAGNOSIS — Z21 Asymptomatic human immunodeficiency virus [HIV] infection status: Secondary | ICD-10-CM | POA: Diagnosis not present

## 2015-02-01 DIAGNOSIS — I2510 Atherosclerotic heart disease of native coronary artery without angina pectoris: Secondary | ICD-10-CM

## 2015-02-01 DIAGNOSIS — R0789 Other chest pain: Secondary | ICD-10-CM

## 2015-02-01 DIAGNOSIS — I2583 Coronary atherosclerosis due to lipid rich plaque: Secondary | ICD-10-CM

## 2015-02-01 DIAGNOSIS — E785 Hyperlipidemia, unspecified: Secondary | ICD-10-CM | POA: Diagnosis not present

## 2015-02-01 DIAGNOSIS — R002 Palpitations: Secondary | ICD-10-CM

## 2015-02-01 NOTE — Progress Notes (Signed)
Patient ID: [name] [name], female   DOB: [birth year], 64 y.o.   MRN: [id number]      HPI  Patient is seen today to follow-up a history of some chest pain and minimal coronary disease. Over time we thought she may have had some spasm. I saw her last October, 2014. I cleared her then to have bladder surgery in 2015. This has been done on 2 occasions and she is stable. Sometimes she has some chest discomfort that she feels in her chest and up to her left neck. He can stay with her for several days. It does not sound like ischemic cardiac pain.  She is coming after 1 year, she is a new patient to me, previously followed by Dr Ron Parker, she is active walking and has noticed worsening exertional CP - pressure like and SOB. It occurs every day. No palpitations, claudications or falls.  She is getting married on Saturday. Lipids followed by PCP, at goal 6 moths ago.  Allergies  Allergen Reactions  . Sulfamethoxazole-Trimethoprim Hives    Current Outpatient Prescriptions  Medication Sig Dispense Refill  . albuterol (VENTOLIN HFA) 108 (90 BASE) MCG/ACT inhaler Inhale 2 puffs into the lungs.      Marland Kitchen alendronate (FOSAMAX) 40 MG tablet Take 40 mg by mouth.    Marland Kitchen amitriptyline (ELAVIL) 50 MG tablet Take 50 mg by mouth at bedtime.      Marland Kitchen aspirin 81 MG tablet Take 81 mg by mouth daily.      . budesonide-formoterol (SYMBICORT) 160-4.5 MCG/ACT inhaler Inhale 2 puffs into the lungs 3 (three) times daily.      . Calcium Carbonate-Vitamin D (OS-CAL 500 + D PO) Take 1 tablet by mouth.    Marland Kitchen emtricitabine-tenofovir (TRUVADA) 200-300 MG per tablet Take 1 tablet by mouth daily.      . fexofenadine (ALLEGRA) 180 MG tablet Take 180 mg by mouth.    Marland Kitchen omeprazole (PRILOSEC) 20 MG capsule Take 20 mg by mouth.    . pravastatin (PRAVACHOL) 80 MG tablet Take 1 tablet by mouth daily.    . raltegravir (ISENTRESS) 400 MG tablet Take 400 mg by mouth 2 (two) times daily.     No current facility-administered medications for this visit.      Social History   Social History  . Marital Status: Widowed    Spouse Name: N/A  . Number of Children: 3  . Years of Education: N/A   Occupational History  . full-time [employer]   Social History Main Topics  . Smoking status: Former Smoker    Quit date: 03/13/1996  . Smokeless tobacco: Not on file  . Alcohol Use: No  . Drug Use: No  . Sexual Activity: Not on file   Other Topics Concern  . Not on file   Social History Narrative    Family History  Problem Relation Age of Onset  . Coronary artery disease      family hx of on mother, father and siblings  . Breast cancer Sister   . Diabetes Mother     Past Medical History  Diagnosis Date  . HIV positive ([place])     Needle stick 1993  . CAD (coronary artery disease)     Minimal by history, Question coronary artery spasm  . Palpitations   . Dyslipidemia   . Weight loss   . Asthma   . Peptic ulcer disease   . GERD (gastroesophageal reflux disease)     Esophageal stricture  .  Colon polyps   . Bradycardia     Sinus bradycardia  . Shortness of breath     Extensive evaluation Dr. Winona Legato, [facility], May, [id number], etiology uncertain, probable bronchiolitis possibility of lung biopsy at that time  . Hyperlipidemia   . H/O: hysterectomy   . Ejection fraction     Past Surgical History  Procedure Laterality Date  . Appendectomy    . Cholecystectomy    . Ventral hernia repair    . Gastrectomy      Patient Active Problem List   Diagnosis Date Noted  . Preop cardiovascular exam 12/24/2012  . Ejection fraction   . HIV positive ([place])   . CAD (coronary artery disease)   . Palpitations   . Dyslipidemia   . Asthma   . Peptic ulcer disease   . GERD (gastroesophageal reflux disease)   . Bradycardia   . Shortness of breath   . PULMONARY FIBROSIS 07/06/2008  . CONSTIPATION 07/22/2007  . WEIGHT LOSS-ABNORMAL 07/22/2007  . ABDOMINAL PAIN-GENERALIZED 07/22/2007   ROS   Patient denies  fever, chills, headache, sweats, rash, change in vision, range in hearing, cough, nausea or vomiting, urinary symptoms. All other systems are reviewed and are negative.  PHYSICAL EXAM  Patient is oriented to person time and place. Affect is normal. She is quite stable today. Head is atraumatic. Sclera and conjunctiva are normal. Lungs are clear. Respiratory effort is not labored. There is no jugular venous distention. Cardiac exam reveals S1 and S2. Abdomen is soft. There is no peripheral edema. There are no musculoskeletal deformities. There are no skin rashes.  Filed Vitals:   02/01/15 0824  BP: 110/82  Pulse: 70  Height: 5' (1.524 m)  Weight: 141 lb 3.2 oz (64.048 kg)   EKG is done today and reviewed by me. There is sinus rhythm. She has old diffuse nonspecific ST-T wave changes. There is no change from the past.   ASSESSMENT & PLAN  1. CAD - In the past the patient had minimal coronary disease. Now worsening exertional CP and DOE, we will order an exercise nuclear stress test.  2. Palpitations - She has only rare palpitations. No further workup.  3. HLP - Her lipids are being treated. No change in therapy.  4. HIV - Patient is HIV positive from a needle stick in 1993. Fortunately she is on appropriate medications and doing well.  If negative stress test, follow up in 1 year.  Dorothy Spark 02/01/2015

## 2015-02-01 NOTE — Patient Instructions (Signed)
Medication Instructions:  Same-no changes  Labwork: None  Testing/Procedures: Your physician has requested that you have en exercise stress myoview. For further information please visit [email or web address]. Please follow instruction sheet, as given.   Follow-Up: Your physician wants you to follow-up in: 1 year. You will receive a reminder letter in the mail two months in advance. If you don't receive a letter, please call our office to schedule the follow-up appointment.       If you need a refill on your cardiac medications before your next appointment, please call your pharmacy.   

## 2015-02-03 ENCOUNTER — Encounter: Payer: Self-pay | Admitting: Cardiology

## 2015-02-10 ENCOUNTER — Telehealth (HOSPITAL_COMMUNITY): Payer: Self-pay | Admitting: *Deleted

## 2015-02-10 NOTE — Telephone Encounter (Signed)
Left message on voicemail per DPR in reference to upcoming appointment scheduled on 02/15/15 at 1000 with detailed instructions given per Myocardial Perfusion Study Information Sheet for the test. LM to arrive 15 minutes early, and that it is imperative to arrive on time for appointment to keep from having the test rescheduled. If you need to cancel or reschedule your appointment, please call the office within 24 hours of your appointment. Failure to do so may result in a cancellation of your appointment, and a $50 no show fee. Phone number given for call back for any questions. Hubbard Robinson, RN

## 2015-02-15 ENCOUNTER — Ambulatory Visit (HOSPITAL_COMMUNITY): Payer: Medicare Other | Attending: Cardiology

## 2015-02-15 DIAGNOSIS — R0789 Other chest pain: Secondary | ICD-10-CM | POA: Insufficient documentation

## 2015-02-15 DIAGNOSIS — R002 Palpitations: Secondary | ICD-10-CM | POA: Insufficient documentation

## 2015-02-15 DIAGNOSIS — R0602 Shortness of breath: Secondary | ICD-10-CM | POA: Diagnosis not present

## 2015-02-15 DIAGNOSIS — R0609 Other forms of dyspnea: Secondary | ICD-10-CM | POA: Insufficient documentation

## 2015-02-15 LAB — MYOCARDIAL PERFUSION IMAGING
Estimated workload: 7 METS
Exercise duration (min): 6 min
Exercise duration (sec): 1 s
LV dias vol: 45 mL
LV sys vol: 10 mL
MPHR: 157 {beats}/min
Peak HR: 141 {beats}/min
Percent HR: 90 %
RATE: 0.4
RPE: 18
Rest HR: 60 {beats}/min
SDS: 3
SRS: 7
SSS: 10
TID: 0.74

## 2015-02-15 MED ORDER — TECHNETIUM TC 99M SESTAMIBI GENERIC - CARDIOLITE
32.4000 | Freq: Once | INTRAVENOUS | Status: AC | PRN
  Administered 2015-02-15: 32 via INTRAVENOUS

## 2015-02-15 MED ORDER — TECHNETIUM TC 99M SESTAMIBI GENERIC - CARDIOLITE
11.0000 | Freq: Once | INTRAVENOUS | Status: AC | PRN
  Administered 2015-02-15: 11 via INTRAVENOUS

## 2015-06-14 DIAGNOSIS — M542 Cervicalgia: Secondary | ICD-10-CM | POA: Insufficient documentation

## 2015-06-29 DIAGNOSIS — L402 Acrodermatitis continua: Secondary | ICD-10-CM | POA: Insufficient documentation

## 2015-06-29 DIAGNOSIS — L408 Other psoriasis: Secondary | ICD-10-CM | POA: Insufficient documentation

## 2015-08-28 ENCOUNTER — Encounter (HOSPITAL_COMMUNITY): Payer: Self-pay

## 2015-08-28 ENCOUNTER — Emergency Department (HOSPITAL_COMMUNITY)
Admission: EM | Admit: 2015-08-28 | Discharge: 2015-08-29 | Disposition: A | Discharge status: Home / Self Care | Payer: Medicare HMO | Attending: Emergency Medicine | Admitting: Emergency Medicine

## 2015-08-28 ENCOUNTER — Other Ambulatory Visit: Payer: Self-pay

## 2015-08-28 ENCOUNTER — Emergency Department (HOSPITAL_COMMUNITY): Payer: Medicare HMO

## 2015-08-28 DIAGNOSIS — R079 Chest pain, unspecified: Secondary | ICD-10-CM | POA: Diagnosis not present

## 2015-08-28 DIAGNOSIS — Z79899 Other long term (current) drug therapy: Secondary | ICD-10-CM | POA: Insufficient documentation

## 2015-08-28 DIAGNOSIS — J45909 Unspecified asthma, uncomplicated: Secondary | ICD-10-CM | POA: Diagnosis not present

## 2015-08-28 DIAGNOSIS — E785 Hyperlipidemia, unspecified: Secondary | ICD-10-CM | POA: Diagnosis not present

## 2015-08-28 DIAGNOSIS — I2510 Atherosclerotic heart disease of native coronary artery without angina pectoris: Secondary | ICD-10-CM | POA: Insufficient documentation

## 2015-08-28 DIAGNOSIS — Z87891 Personal history of nicotine dependence: Secondary | ICD-10-CM | POA: Diagnosis not present

## 2015-08-28 LAB — BASIC METABOLIC PANEL
Anion gap: 6 (ref 5–15)
BUN: 16 mg/dL (ref 6–20)
CALCIUM: 8.9 mg/dL (ref 8.9–10.3)
CHLORIDE: 111 mmol/L (ref 101–111)
CO2: 23 mmol/L (ref 22–32)
CREATININE: 0.86 mg/dL (ref 0.44–1.00)
GFR calc Af Amer: >60 mL/min (ref >60)
GFR calc non Af Amer: >60 mL/min (ref >60)
GLUCOSE: 106 mg/dL — AB (ref 65–99)
Potassium: 4.3 mmol/L (ref 3.5–5.1)
Sodium: 140 mmol/L (ref 135–145)

## 2015-08-28 LAB — CBC
HCT: 40.6 % (ref 36.0–46.0)
Hemoglobin: 13.2 g/dL (ref 12.0–15.0)
MCH: 31.2 pg (ref 26.0–34.0)
MCHC: 32.5 g/dL (ref 30.0–36.0)
MCV: 96 fL (ref 78.0–100.0)
PLATELETS: 260 10*3/uL (ref 150–400)
RBC: 4.23 MIL/uL (ref 3.87–5.11)
RDW: 13.3 % (ref 11.5–15.5)
WBC: 4.7 10*3/uL (ref 4.0–10.5)

## 2015-08-28 LAB — I-STAT TROPONIN, ED
Troponin i, poc: 0 ng/mL (ref 0.00–0.08)
Troponin i, poc: 0 ng/mL (ref 0.00–0.08)

## 2015-08-28 MED ORDER — OXYCODONE-ACETAMINOPHEN 5-325 MG PO TABS
1.0000 | ORAL_TABLET | Freq: Once | ORAL | Status: AC
  Administered 2015-08-29: 1 via ORAL
  Filled 2015-08-28: qty 1

## 2015-08-28 NOTE — ED Provider Notes (Signed)
CSN: [id number]     Arrival date & time 08/28/15  1827 History   First MD Initiated Contact with Patient 08/28/15 2105     Chief Complaint  Patient presents with  . Chest Pain     (Consider location/radiation/quality/duration/timing/severity/associated sxs/prior Treatment) HPI Comments: Patient with a history of HIV positive, CAD, HLD, PUD, GERD, presents with constant, sharp chest pain that started yesterday. It is associated with SOB. No nausea, cough. She has had subjective fevers. She has a history of chest pain and is followed by cardiology with stress test in 2015 negative. No aggravating or alleviating factors.   Patient is a 65 y.o. female presenting with chest pain. The history is provided by the patient. No language interpreter was used.  Chest Pain Chest pain location: Pain starts under right breast, goes across left chest into shoulder. Pain quality: aching   Pain radiates to the back: no   Pain severity:  Moderate Duration:  24 hours Timing:  Constant Progression:  Unchanged Chronicity:  Recurrent Associated symptoms: fever and shortness of breath   Associated symptoms: no abdominal pain, no nausea and not vomiting     Past Medical History  Diagnosis Date  . HIV positive ([place])     Needle stick 1993  . CAD (coronary artery disease)     Minimal by history, Question coronary artery spasm  . Palpitations   . Dyslipidemia   . Weight loss   . Asthma   . Peptic ulcer disease   . GERD (gastroesophageal reflux disease)     Esophageal stricture  . Colon polyps   . Bradycardia     Sinus bradycardia  . Shortness of breath     Extensive evaluation Dr. Winona Legato, [facility], [place], [id number], etiology uncertain, probable bronchiolitis possibility of lung biopsy at that time  . Hyperlipidemia   . H/O: hysterectomy   . Ejection fraction    Past Surgical History  Procedure Laterality Date  . Appendectomy    . Cholecystectomy    . Ventral hernia repair    . Gastrectomy      Family History  Problem Relation Age of Onset  . Coronary artery disease      family hx of on mother, father and siblings  . Breast cancer Sister   . Diabetes Mother    Social History  Substance Use Topics  . Smoking status: Former Smoker    Quit date: 03/13/1996  . Smokeless tobacco: None  . Alcohol Use: No   OB History    No data available     Review of Systems  Constitutional: Positive for fever. Negative for chills.  HENT: Negative.   Respiratory: Positive for shortness of breath.   Cardiovascular: Positive for chest pain.  Gastrointestinal: Negative.  Negative for nausea, vomiting and abdominal pain.  Musculoskeletal: Negative.   Skin: Negative.   Neurological: Negative.       Allergies  Sulfamethoxazole-trimethoprim  Home Medications   Prior to Admission medications   Medication Sig Start Date End Date Taking? Authorizing Provider  amitriptyline (ELAVIL) 50 MG tablet Take 50 mg by mouth at bedtime.     Yes Historical Provider, MD  emtricitabine-tenofovir (TRUVADA) 200-300 MG per tablet Take 1 tablet by mouth daily.     Yes Historical Provider, MD  omeprazole (PRILOSEC) 20 MG capsule Take 20 mg by mouth.   Yes Historical Provider, MD  Pirfenidone (ESBRIET) 267 MG CAPS Take 801 mg by mouth 3 (three) times daily. 02/24/15  Yes Historical Provider, MD  pravastatin (PRAVACHOL) 80 MG tablet Take 1 tablet by mouth daily. 12/12/12  Yes Historical Provider, MD  raltegravir (ISENTRESS) 400 MG tablet Take 400 mg by mouth 2 (two) times daily.   Yes Historical Provider, MD   BP 138/64 mmHg  Pulse 64  Temp(Src) 98.5 F (36.9 C)  Resp 18  Ht 5' (1.524 m)  Wt 61.236 kg  BMI 26.37 kg/m2  SpO2 100% Physical Exam  Constitutional: She is oriented to person, place, and time. She appears well-developed and well-nourished.  HENT:  Head: Normocephalic.  Neck: Normal range of motion. Neck supple.  Cardiovascular: Normal rate and regular rhythm.   Pulmonary/Chest: Effort  normal and breath sounds normal. She has no wheezes. She has no rales. She exhibits no tenderness.  Abdominal: Soft. Bowel sounds are normal. There is no tenderness. There is no rebound and no guarding.  Musculoskeletal: Normal range of motion. She exhibits no edema.  Neurological: She is alert and oriented to person, place, and time.  Skin: Skin is warm and dry. No rash noted.  Psychiatric: She has a normal mood and affect.    ED Course  Procedures (including critical care time) Labs Review Labs Reviewed  BASIC METABOLIC PANEL - Abnormal; Notable for the following:    Glucose, Bld 106 (*)    All other components within normal limits  CBC  I-STAT TROPOININ, ED   Results for orders placed or performed during the hospital encounter of 123456  Basic metabolic panel  Result Value Ref Range   Sodium 140 135 - 145 mmol/L   Potassium 4.3 3.5 - 5.1 mmol/L   Chloride 111 101 - 111 mmol/L   CO2 23 22 - 32 mmol/L   Glucose, Bld 106 (H) 65 - 99 mg/dL   BUN 16 6 - 20 mg/dL   Creatinine, Ser 0.86 0.44 - 1.00 mg/dL   Calcium 8.9 8.9 - 10.3 mg/dL   GFR calc non Af Amer >60 >60 mL/min   GFR calc Af Amer >60 >60 mL/min   Anion gap 6 5 - 15  CBC  Result Value Ref Range   WBC 4.7 4.0 - 10.5 K/uL   RBC 4.23 3.87 - 5.11 MIL/uL   Hemoglobin 13.2 12.0 - 15.0 g/dL   HCT 40.6 36.0 - 46.0 %   MCV 96.0 78.0 - 100.0 fL   MCH 31.2 26.0 - 34.0 pg   MCHC 32.5 30.0 - 36.0 g/dL   RDW 13.3 11.5 - 15.5 %   Platelets 260 150 - 400 K/uL  I-stat troponin, ED  Result Value Ref Range   Troponin i, poc 0.00 0.00 - 0.08 ng/mL   Comment 3          I-stat troponin, ED  Result Value Ref Range   Troponin i, poc 0.00 0.00 - 0.08 ng/mL   Comment 3            Imaging Review Dg Chest 2 View  08/28/2015  CLINICAL DATA:  Right-sided chest pain for 1 day EXAM: CHEST  2 VIEW COMPARISON:  04/01/2008 FINDINGS: Heart size upper normal. Mild bilateral bronchitic change. This was present on is mildly more prominent. No  consolidation or effusion. Possible 15 mm right upper lobe opacity. IMPRESSION: Mild interval enlargement of the cardiac silhouette. Possible pulmonary nodule. CT thorax recommended. Electronically Signed   By: Skipper Cliche M.D.   On: 08/28/2015 19:21   I have personally reviewed and evaluated these images and lab results as part of my medical decision-making.  EKG Interpretation None      MDM   Final diagnoses:  None    1. Chest pain  The patient presents with recurrent chest pain that follows previous pattern of chest pain radiating into left neck and shoulder. Chart reviewed. She is followed regularly by cardiology for "minimal coronary disease", last negative stress test in 2015. Troponin x 2 tonight are negative. EKG is unchanged from previous. Her pain is addressed with Percocet. She is felt stable for discharge home. Encouraged contact with her cardiologist (Dr. Meda Coffee) for further outpatient management.     Charlann Lange, PA-C 08/29/15 0038  Leo Grosser, MD 08/29/15 [phone]

## 2015-08-28 NOTE — ED Provider Notes (Signed)
Signed up for the patient -- she was not in her room. Care assumed by Charlann Lange, PA-C at 22:30.   Berenice Primas, MD 08/28/15 [id number]  Leo Grosser, MD 08/29/15 [phone]

## 2015-08-28 NOTE — ED Notes (Signed)
Patient here with left anterior chest pain since last pm,  Describes the pain as sharp. Nausea with same. Pain worse with inspiration and movemednt

## 2015-08-29 MED ORDER — OXYCODONE-ACETAMINOPHEN 5-325 MG PO TABS: 1.0000 | ORAL_TABLET | ORAL | Status: DC | PRN

## 2015-08-29 NOTE — Discharge Instructions (Signed)
Nonspecific Chest Pain  °Chest pain can be caused by many different conditions. There is always a chance that your pain could be related to something serious, such as a heart attack or a blood clot in your lungs. Chest pain can also be caused by conditions that are not life-threatening. If you have chest pain, it is very important to follow up with your health care provider. °CAUSES  °Chest pain can be caused by: °· Heartburn. °· Pneumonia or bronchitis. °· Anxiety or stress. °· Inflammation around your heart (pericarditis) or lung (pleuritis or pleurisy). °· A blood clot in your lung. °· A collapsed lung (pneumothorax). It can develop suddenly on its own (spontaneous pneumothorax) or from trauma to the chest. °· Shingles infection (varicella-zoster virus). °· Heart attack. °· Damage to the bones, muscles, and cartilage that make up your chest wall. This can include: °¨ Bruised bones due to injury. °¨ Strained muscles or cartilage due to frequent or repeated coughing or overwork. °¨ Fracture to one or more ribs. °¨ Sore cartilage due to inflammation (costochondritis). °RISK FACTORS  °Risk factors for chest pain may include: °· Activities that increase your risk for trauma or injury to your chest. °· Respiratory infections or conditions that cause frequent coughing. °· Medical conditions or overeating that can cause heartburn. °· Heart disease or family history of heart disease. °· Conditions or health behaviors that increase your risk of developing a blood clot. °· Having had chicken pox (varicella zoster). °SIGNS AND SYMPTOMS °Chest pain can feel like: °· Burning or tingling on the surface of your chest or deep in your chest. °· Crushing, pressure, aching, or squeezing pain. °· Dull or sharp pain that is worse when you move, cough, or take a deep breath. °· Pain that is also felt in your back, neck, shoulder, or arm, or pain that spreads to any of these areas. °Your chest pain may come and go, or it may stay  constant. °DIAGNOSIS °Lab tests or other studies may be needed to find the cause of your pain. Your health care provider may have you take a test called an ambulatory ECG (electrocardiogram). An ECG records your heartbeat patterns at the time the test is performed. You may also have other tests, such as: °· Transthoracic echocardiogram (TTE). During echocardiography, sound waves are used to create a picture of all of the heart structures and to look at how blood flows through your heart. °· Transesophageal echocardiogram (TEE). This is a more advanced imaging test that obtains images from inside your body. It allows your health care provider to see your heart in finer detail. °· Cardiac monitoring. This allows your health care provider to monitor your heart rate and rhythm in real time. °· Holter monitor. This is a portable device that records your heartbeat and can help to diagnose abnormal heartbeats. It allows your health care provider to track your heart activity for several days, if needed. °· Stress tests. These can be done through exercise or by taking medicine that makes your heart beat more quickly. °· Blood tests. °· Imaging tests. °TREATMENT  °Your treatment depends on what is causing your chest pain. Treatment may include: °· Medicines. These may include: °¨ Acid blockers for heartburn. °¨ Anti-inflammatory medicine. °¨ Pain medicine for inflammatory conditions. °¨ Antibiotic medicine, if an infection is present. °¨ Medicines to dissolve blood clots. °¨ Medicines to treat coronary artery disease. °· Supportive care for conditions that do not require medicines. This may include: °¨ Resting. °¨ Applying heat   or cold packs to injured areas. °¨ Limiting activities until pain decreases. °HOME CARE INSTRUCTIONS °· If you were prescribed an antibiotic medicine, finish it all even if you start to feel better. °· Avoid any activities that bring on chest pain. °· Do not use any tobacco products, including  cigarettes, chewing tobacco, or electronic cigarettes. If you need help quitting, ask your health care provider. °· Do not drink alcohol. °· Take medicines only as directed by your health care provider. °· Keep all follow-up visits as directed by your health care provider. This is important. This includes any further testing if your chest pain does not go away. °· If heartburn is the cause for your chest pain, you may be told to keep your head raised (elevated) while sleeping. This reduces the chance that acid will go from your stomach into your esophagus. °· Make lifestyle changes as directed by your health care provider. These may include: °¨ Getting regular exercise. Ask your health care provider to suggest some activities that are safe for you. °¨ Eating a heart-healthy diet. A registered dietitian can help you to learn healthy eating options. °¨ Maintaining a healthy weight. °¨ Managing diabetes, if necessary. °¨ Reducing stress. °SEEK MEDICAL CARE IF: °· Your chest pain does not go away after treatment. °· You have a rash with blisters on your chest. °· You have a fever. °SEEK IMMEDIATE MEDICAL CARE IF:  °· Your chest pain is worse. °· You have an increasing cough, or you cough up blood. °· You have severe abdominal pain. °· You have severe weakness. °· You faint. °· You have chills. °· You have sudden, unexplained chest discomfort. °· You have sudden, unexplained discomfort in your arms, back, neck, or jaw. °· You have shortness of breath at any time. °· You suddenly start to sweat, or your skin gets clammy. °· You feel nauseous or you vomit. °· You suddenly feel light-headed or dizzy. °· Your heart begins to beat quickly, or it feels like it is skipping beats. °These symptoms may represent a serious problem that is an emergency. Do not wait to see if the symptoms will go away. Get medical help right away. Call your local emergency services (911 in the [place] Do not drive yourself to the hospital. °  °This  information is not intended to replace advice given to you by your health care provider. Make sure you discuss any questions you have with your health care provider. °  °Document Released: 12/07/2004 Document Revised: 03/20/2014 Document Reviewed: 10/03/2013 °Elsevier Interactive Patient Education ©2016 [employer] Inc[employer] ° °

## 2015-10-13 DIAGNOSIS — R600 Localized edema: Secondary | ICD-10-CM | POA: Insufficient documentation

## 2016-01-21 ENCOUNTER — Telehealth (HOSPITAL_COMMUNITY): Payer: Self-pay

## 2016-01-21 NOTE — Telephone Encounter (Signed)
Called patient regarding Pulmonary Rehab referral. No way to leave message. Will f/u at later time.

## 2016-01-24 ENCOUNTER — Telehealth (HOSPITAL_COMMUNITY): Payer: Self-pay

## 2016-01-24 NOTE — Telephone Encounter (Signed)
Called patient in regards to Pulmonary Rehab referral. Was unable to leave message. This is the second message left. Sent letter in mail.

## 2016-02-01 ENCOUNTER — Emergency Department (HOSPITAL_COMMUNITY)
Admission: EM | Admit: 2016-02-01 | Discharge: 2016-02-01 | Disposition: A | Discharge status: Home / Self Care | Payer: Medicare HMO | Attending: Emergency Medicine | Admitting: Emergency Medicine

## 2016-02-01 ENCOUNTER — Encounter (HOSPITAL_COMMUNITY): Payer: Self-pay | Admitting: Emergency Medicine

## 2016-02-01 DIAGNOSIS — Z79899 Other long term (current) drug therapy: Secondary | ICD-10-CM | POA: Insufficient documentation

## 2016-02-01 DIAGNOSIS — M79641 Pain in right hand: Secondary | ICD-10-CM | POA: Insufficient documentation

## 2016-02-01 DIAGNOSIS — Y9301 Activity, walking, marching and hiking: Secondary | ICD-10-CM | POA: Diagnosis not present

## 2016-02-01 DIAGNOSIS — W108XXA Fall (on) (from) other stairs and steps, initial encounter: Secondary | ICD-10-CM | POA: Insufficient documentation

## 2016-02-01 DIAGNOSIS — W19XXXA Unspecified fall, initial encounter: Secondary | ICD-10-CM

## 2016-02-01 DIAGNOSIS — M25562 Pain in left knee: Secondary | ICD-10-CM | POA: Diagnosis not present

## 2016-02-01 DIAGNOSIS — I2510 Atherosclerotic heart disease of native coronary artery without angina pectoris: Secondary | ICD-10-CM | POA: Diagnosis not present

## 2016-02-01 DIAGNOSIS — Y999 Unspecified external cause status: Secondary | ICD-10-CM | POA: Insufficient documentation

## 2016-02-01 DIAGNOSIS — Y929 Unspecified place or not applicable: Secondary | ICD-10-CM | POA: Diagnosis not present

## 2016-02-01 DIAGNOSIS — Z87891 Personal history of nicotine dependence: Secondary | ICD-10-CM | POA: Diagnosis not present

## 2016-02-01 DIAGNOSIS — M25561 Pain in right knee: Secondary | ICD-10-CM | POA: Insufficient documentation

## 2016-02-01 DIAGNOSIS — J45909 Unspecified asthma, uncomplicated: Secondary | ICD-10-CM | POA: Diagnosis not present

## 2016-02-01 NOTE — ED Triage Notes (Signed)
Per EMS patient comes from [place] office for fall over 2 steps.  Patient c/o bilat knee pain.  Patient ambulatory per EMS.

## 2016-02-01 NOTE — ED Provider Notes (Signed)
[facility] DEPT Provider Note    By signing my name below, I, [name], attest that this documentation has been prepared under the direction and in the presence of [name] [name], [place]. Electronically Signed: [name], ED Scribe. 02/01/16. 5:58 PM.    History   Chief Complaint Chief Complaint  Patient presents with  . Fall  . Knee Pain  . Hand Pain   The history is provided by the patient and medical records. No language interpreter was used.    HPI Comments:  [name] [name] is an HIV positive 65 y.o. female with PMHx of CAD and HLD brought in by EMS, who presents to the Emergency Department complaining of a fall that occurred PTA. She states she was walking out of the social security office and tripped over a step. She reports bilateral knee pain and right hand pain stating she fell on the knees and caught herself with the hand. She has not had anything for pain. She denies modifying factors. She denies head trauma, LOC, numbness, tingling or weakness of the lower extremities, wrist or elbow pain, bruising or wounds. Pt is seen at [facility] for management of her HIV and has an appt tomorrow morning. She is ambulatory without assistance.   Past Medical History:  Diagnosis Date  . Asthma   . Bradycardia    Sinus bradycardia  . CAD (coronary artery disease)    Minimal by history, Question coronary artery spasm  . Colon polyps   . Dyslipidemia   . Ejection fraction   . GERD (gastroesophageal reflux disease)    Esophageal stricture  . H/O: hysterectomy   . HIV positive ([place])    Needle stick 1993  . Hyperlipidemia   . Palpitations   . Peptic ulcer disease   . Shortness of breath    Extensive evaluation Dr. Winona Legato, [facility], [place], [id number], etiology uncertain, probable bronchiolitis possibility of lung biopsy at that time  . Weight loss     Patient Active Problem List   Diagnosis Date Noted  . Preop cardiovascular exam 12/24/2012  . Ejection fraction   .  HIV positive ([place])   . CAD (coronary artery disease)   . Palpitations   . Dyslipidemia   . Asthma   . Peptic ulcer disease   . GERD (gastroesophageal reflux disease)   . Bradycardia   . Shortness of breath   . PULMONARY FIBROSIS 07/06/2008  . CONSTIPATION 07/22/2007  . WEIGHT LOSS-ABNORMAL 07/22/2007  . ABDOMINAL PAIN-GENERALIZED 07/22/2007    Past Surgical History:  Procedure Laterality Date  . APPENDECTOMY    . CHOLECYSTECTOMY    . GASTRECTOMY    . VENTRAL HERNIA REPAIR      OB History    No data available       Home Medications    Prior to Admission medications   Medication Sig Start Date End Date Taking? Authorizing Provider  amitriptyline (ELAVIL) 50 MG tablet Take 50 mg by mouth at bedtime.      Historical Provider, MD  emtricitabine-tenofovir (TRUVADA) 200-300 MG per tablet Take 1 tablet by mouth daily.      Historical Provider, MD  omeprazole (PRILOSEC) 20 MG capsule Take 20 mg by mouth.    Historical Provider, MD  oxyCODONE-acetaminophen (PERCOCET/ROXICET) 5-325 MG tablet Take 1 tablet by mouth every 4 (four) hours as needed for severe pain. 08/29/15   Charlann Lange, PA-C  Pirfenidone (ESBRIET) 267 MG CAPS Take 801 mg by mouth 3 (three) times daily. 02/24/15  Historical Provider, MD  pravastatin (PRAVACHOL) 80 MG tablet Take 1 tablet by mouth daily. 12/12/12   Historical Provider, MD  raltegravir (ISENTRESS) 400 MG tablet Take 400 mg by mouth 2 (two) times daily.    Historical Provider, MD    Family History Family History  Problem Relation Age of Onset  . Coronary artery disease      family hx of on mother, father and siblings  . Breast cancer Sister   . Diabetes Mother     Social History Social History  Substance Use Topics  . Smoking status: Former Smoker    Quit date: 03/13/1996  . Smokeless tobacco: Never Used  . Alcohol use No     Allergies   Sulfamethoxazole-trimethoprim   Review of Systems Review of Systems  Musculoskeletal: Positive for  arthralgias. Negative for joint swelling.  Skin: Negative for color change and wound.  Neurological: Negative for syncope, weakness and numbness.  All other systems reviewed and are negative.    Physical Exam Updated Vital Signs BP 115/73 (BP Location: Right Arm)   Pulse 72   Temp 97.9 F (36.6 C) (Oral)   Resp 18   SpO2 98%   Physical Exam  Constitutional: She is oriented to person, place, and time. She appears well-developed and well-nourished.  HENT:  Head: Normocephalic and atraumatic.  Neck: Normal range of motion.  Cardiovascular: Normal rate.   Pulmonary/Chest: Effort normal.  Musculoskeletal: Normal range of motion. She exhibits tenderness. She exhibits no edema or deformity.  Tenderness to palpation at base of palm of right hand. No deformity, abrasion or bruising. Tenderness to palpation just above patella of bilateral knees. No deformity, abrasion or bruising.  Neurological: She is alert and oriented to person, place, and time.  Skin: Skin is warm and dry.  Psychiatric: She has a normal mood and affect. Her behavior is normal.  Nursing note and vitals reviewed.    ED Treatments / Results  DIAGNOSTIC STUDIES: Oxygen Saturation is 98% on RA, normal by my interpretation.   COORDINATION OF CARE: 4:59 PM- Will provide knee sleeves and encouraged pt to apply ice therapy. Advised pt to follow up with PCP for continuing or worsening symptoms. Pt verbalizes understanding and agrees to plan.  Medications - No data to display  Labs (all labs ordered are listed, but only abnormal results are displayed) Labs Reviewed - No data to display  EKG  EKG Interpretation None       Radiology No results found.  Procedures Procedures (including critical care time)  Medications Ordered in ED Medications - No data to display   Initial Impression / Assessment and Plan / ED Course  I have reviewed the triage vital signs and the nursing notes.  Pertinent labs & imaging  results that were available during my care of the patient were reviewed by me and considered in my medical decision making (see chart for details).  Clinical Course     Informed pt that imaging was not indicated at this time.  Pt advised to follow up with her PCP, and has an appt tomorrow. Patient given knee sleeves while in ED, conservative therapy recommended and discussed. Patient will be discharged home & is agreeable with above plan. Returns precautions discussed. Pt appears safe for discharge.  I personally performed the services described in this documentation, which was scribed in my presence. The recorded information has been reviewed and is accurate.   Final Clinical Impressions(s) / ED Diagnoses   Final diagnoses:  Fall, initial  encounter  Right hand pain  Acute bilateral knee pain    New Prescriptions New Prescriptions   No medications on file     [name] Quill, NP 02/01/16 [place], MD 02/02/16 [phone]

## 2016-02-07 ENCOUNTER — Encounter (HOSPITAL_COMMUNITY): Payer: Self-pay

## 2016-02-07 ENCOUNTER — Encounter (HOSPITAL_COMMUNITY)
Admission: RE | Admit: 2016-02-07 | Discharge: 2016-02-07 | Disposition: A | Discharge status: Home / Self Care | Payer: Medicare HMO | Source: Ambulatory Visit | Attending: Pulmonary Disease | Admitting: Pulmonary Disease

## 2016-02-07 VITALS — BP 122/65 | HR 65 | Resp 18 | Ht 59.0 in | Wt 131.8 lb

## 2016-02-07 DIAGNOSIS — J8410 Pulmonary fibrosis, unspecified: Secondary | ICD-10-CM

## 2016-02-07 DIAGNOSIS — J84112 Idiopathic pulmonary fibrosis: Secondary | ICD-10-CM | POA: Insufficient documentation

## 2016-02-07 NOTE — Progress Notes (Signed)
[name] [name] 65 y.o. female Pulmonary Rehab Orientation Note Patient arrived today in Cardiac and Pulmonary Rehab for orientation to Pulmonary Rehab. She ambulated from [place] parking. She has not been prescribed oxygen for home or portable use. Color good, skin warm and dry. Patient is oriented to time and place. Patient's medical history, psychosocial health, and medications reviewed. Psychosocial assessment reveals pt lives with their spouse. She has been married to her current husband for a year. She was widowed by her previous husband 13 years ago. 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 Pt hobbies include reading, knitting, and traveling for her husband. Pt reports her stress level is low. The only stressful area of her life is her health and she states she is not overly stress about that.  Pt does not exhibit signs of depression. PHQ2/9 score 1/na. Pt shows good  coping skills with positive outlook. She was offered emotional support and reassurance. Physical assessment reveals heart rate is normal. Upper lungs clear, fine crackles noted mid to lower lobes bilat. Grip strength equal, strong. Distal pulses palpable. Moderate non-pitting edema noted to lower legs. Patient stated her MD is aware and following her closely.Patient reports she does take medications as prescribed. Patient states she follows a Regular diet. The patient reports no specific efforts to gain or lose weight. Patient's weight will be monitored closely. Demonstration and practice of PLB using pulse oximeter. Patient able to return demonstration satisfactorily. Safety and hand hygiene in the exercise area reviewed with patient. Patient voices understanding of the information reviewed. Department expectations discussed with patient and achievable goals were set. The patient shows enthusiasm about attending the program and we look forward to working with this nice lady. The patient is scheduled  for a 6 min walk test on 02/15/16 and to begin exercise on 02/22/16 in the 1:30 class.   45 minutes was spent on a variety of activities such as assessment of the patient, obtaining baseline data including height, weight, BMI, and grip strength, verifying medical history, allergies, and current medications, and teaching patient strategies for performing tasks with less respiratory effort with emphasis on pursed lip breathing.

## 2016-02-15 ENCOUNTER — Encounter (HOSPITAL_COMMUNITY)
Admission: RE | Admit: 2016-02-15 | Discharge: 2016-02-15 | Disposition: A | Discharge status: Home / Self Care | Payer: Medicare HMO | Source: Ambulatory Visit | Attending: Pulmonary Disease | Admitting: Pulmonary Disease

## 2016-02-15 DIAGNOSIS — J8410 Pulmonary fibrosis, unspecified: Secondary | ICD-10-CM | POA: Diagnosis not present

## 2016-02-15 NOTE — Progress Notes (Signed)
Pulmonary Individual Treatment Plan  Patient Details  Name: [name] [name] MRN: [id number] Date of Birth: Apr 06, 1950 Referring Provider:   April Manson Pulmonary Rehab Walk Test from 02/15/2016 in [place]  Referring Provider  Dr. Nelda Marseille      Initial Encounter Date:  Flowsheet Row Pulmonary Rehab Walk Test from 02/15/2016 in [place]  Date  02/15/16  Referring Provider  Dr. Nelda Marseille      Visit Diagnosis: Pulmonary fibrosis ([place])  Patient's Home Medications on Admission:   Current Outpatient Prescriptions:  .  albuterol (PROVENTIL HFA;VENTOLIN HFA) 108 (90 Base) MCG/ACT inhaler, INHALE 2 PUFFS BY MOUTH AS DIRECTED AS NEEDED FOR SHORTNESS OF BREATH, Disp: , Rfl:  .  amitriptyline (ELAVIL) 50 MG tablet, Take 50 mg by mouth at bedtime.  , Disp: , Rfl:  .  budesonide-formoterol (SYMBICORT) 160-4.5 MCG/ACT inhaler, Inhale into the lungs., Disp: , Rfl:  .  clobetasol ointment (TEMOVATE) 0.05 %, APPLY TO AFFECTED FINGER 1 TO 2 TIMES DAILY WITH AT LEAST 1 APPLICATION UNDER OCCLUSION, Disp: , Rfl:  .  conjugated estrogens (PREMARIN) vaginal cream, Use 1/4 applicator nightly and then 3 times weekly thereafter, Disp: , Rfl:  .  docusate sodium (COLACE) 100 MG capsule, Take 100 mg by mouth., Disp: , Rfl:  .  emtricitabine-tenofovir (TRUVADA) 200-300 MG tablet, TAKE 1 TABLET BY MOUTH EVERY DAY, Disp: , Rfl:  .  omeprazole (PRILOSEC) 40 MG capsule, Take 40 mg by mouth., Disp: , Rfl:  .  Pirfenidone 267 MG CAPS, Take 534 mg by mouth., Disp: , Rfl:  .  polyethylene glycol (MIRALAX / GLYCOLAX) packet, Take 17 g by mouth., Disp: , Rfl:  .  pravastatin (PRAVACHOL) 80 MG tablet, Take 1 tablet by mouth daily., Disp: , Rfl:  .  raltegravir (ISENTRESS) 400 MG tablet, TAKE 1 TABLET BY MOUTH TWICE DAILY, Disp: , Rfl:  .  senna-docusate (SENOKOT-S) 8.6-50 MG tablet, Take by mouth., Disp: , Rfl:  .  sucralfate (CARAFATE) 1 GM/10ML suspension, Take  1 g by mouth., Disp: , Rfl:   Past Medical History: Past Medical History:  Diagnosis Date  . Asthma   . Bradycardia    Sinus bradycardia  . CAD (coronary artery disease)    Minimal by history, Question coronary artery spasm  . Colon polyps   . Dyslipidemia   . Ejection fraction   . GERD (gastroesophageal reflux disease)    Esophageal stricture  . H/O: hysterectomy   . HIV positive ([place])    Needle stick 1993  . Hyperlipidemia   . Palpitations   . Peptic ulcer disease   . Shortness of breath    Extensive evaluation Dr. Winona Legato, [facility], [place], [id number], etiology uncertain, probable bronchiolitis possibility of lung biopsy at that time  . Weight loss     Tobacco Use: History  Smoking Status  . Former Smoker  . Quit date: 03/13/1996  Smokeless Tobacco  . Never Used    Labs: Recent Review Flowsheet Data    There is no flowsheet data to display.      Capillary Blood Glucose: Lab Results  Component Value Date   GLUCAP 87 04/10/2014     ADL UCSD:   Pulmonary Function Assessment:     Pulmonary Function Assessment - 02/07/16 1450      Breath   Bilateral Breath Sounds Other  fine crackles mid to lower lobes bilat.    Shortness of Breath Yes;Limiting activity  Exercise Target Goals: Date: 02/15/16  Exercise Program Goal: Individual exercise prescription set with THRR, safety & activity barriers. Participant demonstrates ability to understand and report RPE using BORG scale, to self-measure pulse accurately, and to acknowledge the importance of the exercise prescription.  Exercise Prescription Goal: Starting with aerobic activity 30 plus minutes a day, 3 days per week for initial exercise prescription. Provide home exercise prescription and guidelines that participant acknowledges understanding prior to discharge.  Activity Barriers & Risk Stratification:     Activity Barriers & Cardiac Risk Stratification - 02/07/16 1448      Activity Barriers & Cardiac  Risk Stratification   Activity Barriers Shortness of Breath      6 Minute Walk:     6 Minute Walk    Row Name 02/15/16 1609         6 Minute Walk   Phase Initial     Distance 1224 feet     Walk Time 6 minutes     # of Rest Breaks 0     MPH 2.31     METS 2.76     RPE 13     Perceived Dyspnea  3     Symptoms No     Resting HR 67 bpm     Resting BP 112/70     Max Ex. HR 85 bpm     Max Ex. BP 118/68       Interval HR   Baseline HR 67     1 Minute HR 81     2 Minute HR 80     3 Minute HR 85     4 Minute HR 84     5 Minute HR 86     6 Minute HR 85     2 Minute Post HR 80     Interval Heart Rate? Yes       Interval Oxygen   Interval Oxygen? Yes     Baseline Oxygen Saturation % 99 %     Baseline Liters of Oxygen 0 L     1 Minute Oxygen Saturation % 99 %     1 Minute Liters of Oxygen 0 L     2 Minute Oxygen Saturation % 90 %     2 Minute Liters of Oxygen 0 L     3 Minute Oxygen Saturation % 94 %     3 Minute Liters of Oxygen 0 L     4 Minute Oxygen Saturation % 93 %     4 Minute Liters of Oxygen 0 L     5 Minute Oxygen Saturation % 99 %     5 Minute Liters of Oxygen 0 L     6 Minute Oxygen Saturation % 93 %     6 Minute Liters of Oxygen 0 L     2 Minute Post Oxygen Saturation % 100 %     2 Minute Post Liters of Oxygen 0 L        Initial Exercise Prescription:     Initial Exercise Prescription - 02/15/16 1600      Date of Initial Exercise RX and Referring Provider   Date 02/15/16   Referring Provider Dr. Nelda Marseille     NuStep   Level 1   Minutes 17   METs 1.5     Rower   Level 1   Minutes 17     Track   Laps 5   Minutes 17     Prescription Details  Frequency (times per week) 2   Duration Progress to 45 minutes of aerobic exercise without signs/symptoms of physical distress     Intensity   THRR 40-80% of Max Heartrate 62-125   Ratings of Perceived Exertion 11-13   Perceived Dyspnea 0-4     Progression   Progression Continue progressive  overload as per policy without signs/symptoms or physical distress.     Resistance Training   Training Prescription Yes   Weight orange bands   Reps 10-12      Perform Capillary Blood Glucose checks as needed.  Exercise Prescription Changes:   Exercise Comments:   Discharge Exercise Prescription (Final Exercise Prescription Changes):    Nutrition:  Target Goals: Understanding of nutrition guidelines, daily intake of sodium 1500mg , cholesterol 200mg , calories 30% from fat and 7% or less from saturated fats, daily to have 5 or more servings of fruits and vegetables.  Biometrics:     Pre Biometrics - 02/07/16 1505      Pre Biometrics   Grip Strength 20 kg       Nutrition Therapy Plan and Nutrition Goals:   Nutrition Discharge: Rate Your Plate Scores:   Psychosocial: Target Goals: Acknowledge presence or absence of depression, maximize coping skills, provide positive support system. Participant is able to verbalize types and ability to use techniques and skills needed for reducing stress and depression.  Initial Review & Psychosocial Screening:     Initial Psych Review & Screening - 02/07/16 [place]? Yes     Barriers   Psychosocial barriers to participate in program There are no identifiable barriers or psychosocial needs.;The patient should benefit from training in stress management and relaxation.     Screening Interventions   Interventions Encouraged to exercise      Quality of Life Scores:   PHQ-9: Recent Review Flowsheet Data    Depression screen PHQ 2/9 02/07/2016   Decreased Interest 1   Down, Depressed, Hopeless 0   PHQ - 2 Score 1      Psychosocial Evaluation and Intervention:   Psychosocial Re-Evaluation:  Education: Education Goals: Education classes will be provided on a weekly basis, covering required topics. Participant will state understanding/return demonstration of topics  presented.  Learning Barriers/Preferences:     Learning Barriers/Preferences - 02/07/16 1450      Learning Barriers/Preferences   Learning Barriers None   Learning Preferences Group Instruction;Written Material;Individual Instruction      Education Topics: Risk Factor Reduction:  -Group instruction that is supported by a PowerPoint presentation. Instructor discusses the definition of a risk factor, different risk factors for pulmonary disease, and how the heart and lungs work together.     Nutrition for Pulmonary Patient:  -Group instruction provided by PowerPoint slides, verbal discussion, and written materials to support subject matter. The instructor gives an explanation and review of healthy diet recommendations, which includes a discussion on weight management, recommendations for fruit and vegetable consumption, as well as protein, fluid, caffeine, fiber, sodium, sugar, and alcohol. Tips for eating when patients are short of breath are discussed.   Pursed Lip Breathing:  -Group instruction that is supported by demonstration and informational handouts. Instructor discusses the benefits of pursed lip and diaphragmatic breathing and detailed demonstration on how to preform both.     Oxygen Safety:  -Group instruction provided by PowerPoint, verbal discussion, and written material to support subject matter. There is an overview of "What is Oxygen" and "Why do we  need it".  Instructor also reviews how to create a safe environment for oxygen use, the importance of using oxygen as prescribed, and the risks of noncompliance. There is a brief discussion on traveling with oxygen and resources the patient may utilize.   Oxygen Equipment:  -Group instruction provided by [facility] Staff utilizing handouts, written materials, and equipment demonstrations.   Signs and Symptoms:  -Group instruction provided by written material and verbal discussion to support subject matter. Warning signs  and symptoms of infection, stroke, and heart attack are reviewed and when to call the physician/911 reinforced. Tips for preventing the spread of infection discussed.   Advanced Directives:  -Group instruction provided by verbal instruction and written material to support subject matter. Instructor reviews Advanced Directive laws and proper instruction for filling out document.   Pulmonary Video:  -Group video education that reviews the importance of medication and oxygen compliance, exercise, good nutrition, pulmonary hygiene, and pursed lip and diaphragmatic breathing for the pulmonary patient.   Exercise for the Pulmonary Patient:  -Group instruction that is supported by a PowerPoint presentation. Instructor discusses benefits of exercise, core components of exercise, frequency, duration, and intensity of an exercise routine, importance of utilizing pulse oximetry during exercise, safety while exercising, and options of places to exercise outside of rehab.     Pulmonary Medications:  -Verbally interactive group education provided by instructor with focus on inhaled medications and proper administration.   Anatomy and Physiology of the Respiratory System and Intimacy:  -Group instruction provided by PowerPoint, verbal discussion, and written material to support subject matter. Instructor reviews respiratory cycle and anatomical components of the respiratory system and their functions. Instructor also reviews differences in obstructive and restrictive respiratory diseases with examples of each. Intimacy, Sex, and Sexuality differences are reviewed with a discussion on how relationships can change when diagnosed with pulmonary disease. Common sexual concerns are reviewed.   Knowledge Questionnaire Score:   Core Components/Risk Factors/Patient Goals at Admission:     Personal Goals and Risk Factors at Admission - 02/07/16 1456      Core Components/Risk Factors/Patient Goals on Admission    Improve shortness of breath with ADL's Yes   Intervention Provide education, individualized exercise plan and daily activity instruction to help decrease symptoms of SOB with activities of daily living.   Expected Outcomes Short Term: Achieves a reduction of symptoms when performing activities of daily living.   Develop more efficient breathing techniques such as purse lipped breathing and diaphragmatic breathing; and practicing self-pacing with activity Yes   Intervention Provide education, demonstration and support about specific breathing techniuqes utilized for more efficient breathing. Include techniques such as pursed lipped breathing, diaphragmatic breathing and self-pacing activity.   Expected Outcomes Short Term: Participant will be able to demonstrate and use breathing techniques as needed throughout daily activities.      Core Components/Risk Factors/Patient Goals Review:    Core Components/Risk Factors/Patient Goals at Discharge (Final Review):    ITP Comments:   Comments:

## 2016-02-17 ENCOUNTER — Encounter (HOSPITAL_COMMUNITY): Payer: Self-pay | Admitting: *Deleted

## 2016-02-22 ENCOUNTER — Encounter (HOSPITAL_COMMUNITY)
Admission: RE | Admit: 2016-02-22 | Discharge: 2016-02-22 | Disposition: A | Discharge status: Home / Self Care | Payer: Medicare HMO | Source: Ambulatory Visit | Attending: Pulmonary Disease | Admitting: Pulmonary Disease

## 2016-02-22 VITALS — Wt 130.1 lb

## 2016-02-22 DIAGNOSIS — J8410 Pulmonary fibrosis, unspecified: Secondary | ICD-10-CM | POA: Diagnosis not present

## 2016-02-22 NOTE — Progress Notes (Signed)
Pt completed Quality of Life survey as a participant in Pulmonary Rehab. Scores 21.0 or below are considered low. Pt score very low in several areas Overall 19.62, Health and Function 16.80, physiological and spiritual 22.50, family 16.50. Patient is hopeful that the exercise sessions will improve her strength and stamina and improve her quality of life and in turn improve her health and fitness.

## 2016-02-22 NOTE — Progress Notes (Signed)
Daily Session Note  Patient Details  Name: [name] [name] MRN: [id number] Date of Birth: Aug 08, 1950 Referring Provider:   April Manson Pulmonary Rehab Walk Test from 02/15/2016 in [place]  Referring Provider  Dr. Nelda Marseille      Encounter Date: 02/22/2016  Check In:     Session Check In - 02/22/16 1330      Check-In   Location [facility]   Staff Present Rosebud Poles, RN, BSN;Molly diVincenzo, MS, ACSM RCEP, Exercise Physiologist;Lisa Ysidro Evert, RN;Portia Rollene Rotunda, RN, BSN   Supervising physician immediately available to respond to emergencies [facility] immediately available   Physician(s) Dr. Nada Maclachlan   Medication changes reported     No   Fall or balance concerns reported    No   Warm-up and Cool-down Performed as group-led instruction   Resistance Training Performed Yes   VAD Patient? No     Pain Assessment   Currently in Pain? No/denies   Multiple Pain Sites No      Capillary Blood Glucose: No results found for this or any previous visit (from the past 24 hour(s)).      Exercise Prescription Changes - 02/22/16 1500      Response to Exercise   Blood Pressure (Admit) 110/62   Blood Pressure (Exercise) 104/60   Blood Pressure (Exit) 98/60   Heart Rate (Admit) 77 bpm   Heart Rate (Exercise) 96 bpm   Heart Rate (Exit) 68 bpm   Oxygen Saturation (Admit) 95 %   Oxygen Saturation (Exercise) 96 %   Oxygen Saturation (Exit) 93 %   Rating of Perceived Exertion (Exercise) 15   Perceived Dyspnea (Exercise) 2   Duration Progress to 45 minutes of aerobic exercise without signs/symptoms of physical distress   Intensity --  40-80% HRR     Progression   Progression Continue to progress workloads to maintain intensity without signs/symptoms of physical distress.     Resistance Training   Training Prescription Yes   Weight orange bands   Reps 10-12  10 minutes of strength training     Interval Training   Interval Training  No     NuStep   Level 1   Minutes 17   METs 2.2     Rower   Level 1   Minutes 17     Track   Laps 13   Minutes 17     Goals Met:  Exercise tolerated well Strength training completed today  Goals Unmet:  Not Applicable  Comments: Service time is from 1330 to 1500    Dr. Rush Farmer is Medical Director for Pulmonary Rehab at [facility].

## 2016-02-24 ENCOUNTER — Encounter (HOSPITAL_COMMUNITY)
Admission: RE | Admit: 2016-02-24 | Discharge: 2016-02-24 | Disposition: A | Discharge status: Home / Self Care | Payer: Medicare HMO | Source: Ambulatory Visit | Attending: Pulmonary Disease | Admitting: Pulmonary Disease

## 2016-02-24 VITALS — Wt 129.2 lb

## 2016-02-24 DIAGNOSIS — J8410 Pulmonary fibrosis, unspecified: Secondary | ICD-10-CM | POA: Diagnosis not present

## 2016-02-24 NOTE — Progress Notes (Signed)
Daily Session Note  Patient Details  Name: [name] [name] MRN: [id number] Date of Birth: 01/13/1951 Referring Provider:   Flowsheet Row Pulmonary Rehab Walk Test from 02/15/2016 in [facility]  Referring Provider  Dr. Yacoub      Encounter Date: 02/24/2016  Check In:     Session Check In - 02/24/16 1354      Check-In   Location [facility]      Capillary Blood Glucose: No results found for this or any previous visit (from the past 24 hour(s)).      Exercise Prescription Changes - 02/24/16 1500      Response to Exercise   Blood Pressure (Admit) 112/60   Blood Pressure (Exercise) 130/64   Blood Pressure (Exit) 108/82   Heart Rate (Admit) 76 bpm   Heart Rate (Exercise) 92 bpm   Heart Rate (Exit) 68 bpm   Oxygen Saturation (Admit) 99 %   Oxygen Saturation (Exercise) 98 %   Oxygen Saturation (Exit) 92 %   Rating of Perceived Exertion (Exercise) 13   Perceived Dyspnea (Exercise) 3   Duration Progress to 45 minutes of aerobic exercise without signs/symptoms of physical distress   Intensity --  40-80% HRR     Progression   Progression Continue to progress workloads to maintain intensity without signs/symptoms of physical distress.     Resistance Training   Training Prescription Yes   Weight orange bands   Reps 10-12  10 minutes of strength training     Interval Training   Interval Training No     Rower   Level 1   Minutes 17     Track   Laps 14   Minutes 17     Goals Met:  Exercise tolerated well No report of cardiac concerns or symptoms Strength training completed today  Goals Unmet:  Not Applicable  Comments: Service time is from 1:30pm to 3:20pm    Dr. Wesam G. Yacoub is Medical Director for Pulmonary Rehab at [facility]. 

## 2016-02-25 ENCOUNTER — Encounter: Payer: Self-pay | Admitting: Cardiology

## 2016-02-29 ENCOUNTER — Encounter (HOSPITAL_COMMUNITY)
Admission: RE | Admit: 2016-02-29 | Discharge: 2016-02-29 | Disposition: A | Discharge status: Home / Self Care | Payer: Medicare HMO | Source: Ambulatory Visit | Attending: Pulmonary Disease | Admitting: Pulmonary Disease

## 2016-02-29 VITALS — Wt 130.3 lb

## 2016-02-29 DIAGNOSIS — J8410 Pulmonary fibrosis, unspecified: Secondary | ICD-10-CM | POA: Diagnosis not present

## 2016-02-29 NOTE — Progress Notes (Signed)
Daily Session Note  Patient Details  Name: [name] [name] MRN: [id number] Date of Birth: 1951-01-26 Referring Provider:   April Manson Pulmonary Rehab Walk Test from 02/15/2016 in [place]  Referring Provider  Dr. Nelda Marseille      Encounter Date: 02/29/2016  Check In:     Session Check In - 02/29/16 1544      Check-In   [facility]   Staff Present Su Hilt, MS, ACSM RCEP, Exercise Physiologist;Portia Rollene Rotunda, Therapist, sports, BSN;Ramon Dredge, RN, [facility]   Supervising physician immediately available to respond to emergencies [facility] immediately available   Physician(s) Dr. Carles Collet   Medication changes reported     No   Fall or balance concerns reported    No   Warm-up and Cool-down Performed as group-led instruction   Resistance Training Performed Yes   VAD Patient? No     Pain Assessment   Currently in Pain? No/denies   Multiple Pain Sites No      Capillary Blood Glucose: No results found for this or any previous visit (from the past 24 hour(s)).      Exercise Prescription Changes - 02/29/16 1600      Exercise Review   Progression Yes     Response to Exercise   Blood Pressure (Admit) 96/58   Blood Pressure (Exercise) 112/80   Blood Pressure (Exit) 98/60   Heart Rate (Admit) 79 bpm   Heart Rate (Exercise) 102 bpm   Heart Rate (Exit) 74 bpm   Oxygen Saturation (Admit) 99 %   Oxygen Saturation (Exercise) 95 %   Oxygen Saturation (Exit) 98 %   Rating of Perceived Exertion (Exercise) 16   Perceived Dyspnea (Exercise) 3   Duration Progress to 45 minutes of aerobic exercise without signs/symptoms of physical distress   Intensity --  40-80% HRR     Progression   Progression Continue to progress workloads to maintain intensity without signs/symptoms of physical distress.     Resistance Training   Training Prescription Yes   Weight orange bands   Reps 10-12  10 minutes of strength training     Interval  Training   Interval Training No     NuStep   Level 2   Minutes 17   METs 1.9     Rower   Level 4   Minutes 17     Track   Laps 16   Minutes 17     Goals Met:  Exercise tolerated well No report of cardiac concerns or symptoms Strength training completed today  Goals Unmet:  Not Applicable  Comments: Service time is from 1:30pm to 3:00pm    Dr. Rush Farmer is Medical Director for Pulmonary Rehab at [facility].

## 2016-03-02 ENCOUNTER — Encounter (HOSPITAL_COMMUNITY)
Admission: RE | Admit: 2016-03-02 | Discharge: 2016-03-02 | Disposition: A | Discharge status: Home / Self Care | Payer: Medicare HMO | Source: Ambulatory Visit | Attending: Pulmonary Disease | Admitting: Pulmonary Disease

## 2016-03-02 VITALS — Wt 128.5 lb

## 2016-03-02 DIAGNOSIS — J8410 Pulmonary fibrosis, unspecified: Secondary | ICD-10-CM

## 2016-03-02 NOTE — Progress Notes (Signed)
Daily Session Note  Patient Details  Name: [name] [name] MRN: [id number] Date of Birth: [birth year] Referring Provider:   April Manson Pulmonary Rehab Walk Test from 02/15/2016 in [place]  Referring Provider  Dr. Nelda Marseille      Encounter Date: 03/02/2016  Check In:     Session Check In - 03/02/16 1337      Check-In   [facility]   Staff Present Su Hilt, MS, ACSM RCEP, Exercise Physiologist;Joan Leonia Reeves, RN, Luisa Hart, RN, BSN   Supervising physician immediately available to respond to emergencies [facility] immediately available   Physician(s) Dr. Ree Kida   Medication changes reported     No   Fall or balance concerns reported    No   Warm-up and Cool-down Performed as group-led instruction   Resistance Training Performed Yes   VAD Patient? No     Pain Assessment   Currently in Pain? No/denies   Multiple Pain Sites No      Capillary Blood Glucose: No results found for this or any previous visit (from the past 24 hour(s)).      Exercise Prescription Changes - 03/02/16 1500      Response to Exercise   Blood Pressure (Admit) 128/70   Blood Pressure (Exercise) 96/62   Blood Pressure (Exit) 94/60   Heart Rate (Admit) 82 bpm   Heart Rate (Exercise) 81 bpm   Heart Rate (Exit) 66 bpm   Oxygen Saturation (Admit) 98 %   Oxygen Saturation (Exercise) 98 %   Oxygen Saturation (Exit) 97 %   Rating of Perceived Exertion (Exercise) 17   Perceived Dyspnea (Exercise) 3   Duration Progress to 45 minutes of aerobic exercise without signs/symptoms of physical distress   Intensity --  40-80% HRR     Progression   Progression Continue to progress workloads to maintain intensity without signs/symptoms of physical distress.     Resistance Training   Training Prescription Yes   Weight orange bands   Reps 10-12  10 minutes of strength training     Interval Training   Interval Training No     NuStep   Level 2   Minutes 17   METs 2.2     Rower   Level 4   Minutes 17     Goals Met:  Exercise tolerated well No report of cardiac concerns or symptoms Strength training completed today  Goals Unmet:  Not Applicable  Comments: Service time is from 1:30p to 3:15p    Dr. Rush Farmer is Medical Director for Pulmonary Rehab at [facility].

## 2016-03-07 ENCOUNTER — Encounter (HOSPITAL_COMMUNITY)
Admission: RE | Admit: 2016-03-07 | Discharge: 2016-03-07 | Disposition: A | Discharge status: Home / Self Care | Payer: Medicare HMO | Source: Ambulatory Visit | Attending: Pulmonary Disease | Admitting: Pulmonary Disease

## 2016-03-07 VITALS — Wt 128.1 lb

## 2016-03-07 DIAGNOSIS — J8410 Pulmonary fibrosis, unspecified: Secondary | ICD-10-CM

## 2016-03-07 NOTE — Progress Notes (Signed)
Daily Session Note  Patient Details  Name: [name] [name] MRN: [id number] Date of Birth: [birth year] Referring Provider:   April Manson Pulmonary Rehab Walk Test from 02/15/2016 in [place]  Referring Provider  Dr. Nelda Marseille      Encounter Date: 03/07/2016  Check In:     Session Check In - 03/07/16 1349      Check-In   Location [facility]   Staff Present Rosebud Poles, RN, Luisa Hart, RN, Deland Pretty, MS, ACSM CEP, Exercise Physiologist   Supervising physician immediately available to respond to emergencies [facility] immediately available   Physician(s) Dr. Ree Kida   Medication changes reported     No   Fall or balance concerns reported    No   Warm-up and Cool-down Performed as group-led instruction   Resistance Training Performed Yes   VAD Patient? No     Pain Assessment   Currently in Pain? No/denies   Multiple Pain Sites No      Capillary Blood Glucose: No results found for this or any previous visit (from the past 24 hour(s)).      Exercise Prescription Changes - 03/07/16 1500      Response to Exercise   Blood Pressure (Admit) 108/62   Blood Pressure (Exercise) 98/58   Blood Pressure (Exit) 92/54   Heart Rate (Admit) 67 bpm   Heart Rate (Exercise) 87 bpm   Heart Rate (Exit) 71 bpm   Oxygen Saturation (Admit) 99 %   Oxygen Saturation (Exercise) 98 %   Oxygen Saturation (Exit) 98 %   Rating of Perceived Exertion (Exercise) 15   Perceived Dyspnea (Exercise) 2   Duration Progress to 45 minutes of aerobic exercise without signs/symptoms of physical distress   Intensity THRR unchanged     Progression   Progression Continue to progress workloads to maintain intensity without signs/symptoms of physical distress.     Resistance Training   Training Prescription Yes   Weight orange bands   Reps 10-12  10 minutes of strength training     Interval Training   Interval Training No     NuStep    Level 2   Minutes 17   METs 1.9     Rower   Level 1   Minutes 17     Track   Laps 14   Minutes 17     Goals Met:  Exercise tolerated well Strength training completed today  Goals Unmet:  Not Applicable  Comments: Service time is from 1330 to 1500    Dr. Rush Farmer is Medical Director for Pulmonary Rehab at [facility]

## 2016-03-09 ENCOUNTER — Encounter (HOSPITAL_COMMUNITY)
Admission: RE | Admit: 2016-03-09 | Discharge: 2016-03-09 | Disposition: A | Discharge status: Home / Self Care | Payer: Medicare HMO | Source: Ambulatory Visit | Attending: Pulmonary Disease | Admitting: Pulmonary Disease

## 2016-03-09 DIAGNOSIS — J8410 Pulmonary fibrosis, unspecified: Secondary | ICD-10-CM

## 2016-03-09 NOTE — Progress Notes (Signed)
Pulmonary Individual Treatment Plan  Patient Details  Name: [name] [name] MRN: [id number] Date of Birth: Nov 01, 1950 Referring Provider:   April Manson Pulmonary Rehab Walk Test from 02/15/2016 in [place]  Referring Provider  Dr. Nelda Marseille      Initial Encounter Date:  Flowsheet Row Pulmonary Rehab Walk Test from 02/15/2016 in [place]  Date  02/15/16  Referring Provider  Dr. Nelda Marseille      Visit Diagnosis: Pulmonary fibrosis ([place])  Patient's Home Medications on Admission:   Current Outpatient Prescriptions:  .  albuterol (PROVENTIL HFA;VENTOLIN HFA) 108 (90 Base) MCG/ACT inhaler, INHALE 2 PUFFS BY MOUTH AS DIRECTED AS NEEDED FOR SHORTNESS OF BREATH, Disp: , Rfl:  .  amitriptyline (ELAVIL) 50 MG tablet, Take 50 mg by mouth at bedtime.  , Disp: , Rfl:  .  budesonide-formoterol (SYMBICORT) 160-4.5 MCG/ACT inhaler, Inhale into the lungs., Disp: , Rfl:  .  clobetasol ointment (TEMOVATE) 0.05 %, APPLY TO AFFECTED FINGER 1 TO 2 TIMES DAILY WITH AT LEAST 1 APPLICATION UNDER OCCLUSION, Disp: , Rfl:  .  conjugated estrogens (PREMARIN) vaginal cream, Use 1/4 applicator nightly and then 3 times weekly thereafter, Disp: , Rfl:  .  docusate sodium (COLACE) 100 MG capsule, Take 100 mg by mouth., Disp: , Rfl:  .  emtricitabine-tenofovir (TRUVADA) 200-300 MG tablet, TAKE 1 TABLET BY MOUTH EVERY DAY, Disp: , Rfl:  .  omeprazole (PRILOSEC) 40 MG capsule, Take 40 mg by mouth., Disp: , Rfl:  .  Pirfenidone 267 MG CAPS, Take 534 mg by mouth., Disp: , Rfl:  .  polyethylene glycol (MIRALAX / GLYCOLAX) packet, Take 17 g by mouth., Disp: , Rfl:  .  pravastatin (PRAVACHOL) 80 MG tablet, Take 1 tablet by mouth daily., Disp: , Rfl:  .  raltegravir (ISENTRESS) 400 MG tablet, TAKE 1 TABLET BY MOUTH TWICE DAILY, Disp: , Rfl:  .  senna-docusate (SENOKOT-S) 8.6-50 MG tablet, Take by mouth., Disp: , Rfl:  .  sucralfate (CARAFATE) 1 GM/10ML suspension, Take  1 g by mouth., Disp: , Rfl:   Past Medical History: Past Medical History:  Diagnosis Date  . Asthma   . Bradycardia    Sinus bradycardia  . CAD (coronary artery disease)    Minimal by history, Question coronary artery spasm  . Colon polyps   . Dyslipidemia   . Ejection fraction   . GERD (gastroesophageal reflux disease)    Esophageal stricture  . H/O: hysterectomy   . HIV positive ([place])    Needle stick 1993  . Hyperlipidemia   . Palpitations   . Peptic ulcer disease   . Shortness of breath    Extensive evaluation Dr. Winona Legato, [facility], May, 1219, etiology uncertain, probable bronchiolitis possibility of lung biopsy at that time  . Weight loss     Tobacco Use: History  Smoking Status  . Former Smoker  . Quit date: 03/13/1996  Smokeless Tobacco  . Never Used    Labs: Recent Review Flowsheet Data    There is no flowsheet data to display.      Capillary Blood Glucose: Lab Results  Component Value Date   GLUCAP 87 04/10/2014     ADL UCSD:     Pulmonary Assessment Scores    Row Name 02/17/16 1400         ADL UCSD   ADL Phase Entry     SOB Score total 73        Pulmonary Function Assessment:  Pulmonary Function Assessment - 02/07/16 1450      Breath   Bilateral Breath Sounds Other  fine crackles mid to lower lobes bilat.    Shortness of Breath Yes;Limiting activity      Exercise Target Goals:    Exercise Program Goal: Individual exercise prescription set with THRR, safety & activity barriers. Participant demonstrates ability to understand and report RPE using BORG scale, to self-measure pulse accurately, and to acknowledge the importance of the exercise prescription.  Exercise Prescription Goal: Starting with aerobic activity 30 plus minutes a day, 3 days per week for initial exercise prescription. Provide home exercise prescription and guidelines that participant acknowledges understanding prior to discharge.  Activity Barriers & Risk  Stratification:     Activity Barriers & Cardiac Risk Stratification - 02/07/16 1448      Activity Barriers & Cardiac Risk Stratification   Activity Barriers Shortness of Breath      6 Minute Walk:     6 Minute Walk    Row Name 02/15/16 1609         6 Minute Walk   Phase Initial     Distance 1224 feet     Walk Time 6 minutes     # of Rest Breaks 0     MPH 2.31     METS 2.76     RPE 13     Perceived Dyspnea  3     Symptoms No     Resting HR 67 bpm     Resting BP 112/70     Max Ex. HR 85 bpm     Max Ex. BP 118/68       Interval HR   Baseline HR 67     1 Minute HR 81     2 Minute HR 80     3 Minute HR 85     4 Minute HR 84     5 Minute HR 86     6 Minute HR 85     2 Minute Post HR 80     Interval Heart Rate? Yes       Interval Oxygen   Interval Oxygen? Yes     Baseline Oxygen Saturation % 99 %     Baseline Liters of Oxygen 0 L     1 Minute Oxygen Saturation % 99 %     1 Minute Liters of Oxygen 0 L     2 Minute Oxygen Saturation % 90 %     2 Minute Liters of Oxygen 0 L     3 Minute Oxygen Saturation % 94 %     3 Minute Liters of Oxygen 0 L     4 Minute Oxygen Saturation % 93 %     4 Minute Liters of Oxygen 0 L     5 Minute Oxygen Saturation % 99 %     5 Minute Liters of Oxygen 0 L     6 Minute Oxygen Saturation % 93 %     6 Minute Liters of Oxygen 0 L     2 Minute Post Oxygen Saturation % 100 %     2 Minute Post Liters of Oxygen 0 L        Initial Exercise Prescription:     Initial Exercise Prescription - 02/15/16 1600      Date of Initial Exercise RX and Referring Provider   Date 02/15/16   Referring Provider Dr. Nelda Marseille     NuStep   Level 1  Minutes 17   METs 1.5     Rower   Level 1   Minutes 17     Track   Laps 5   Minutes 17     Prescription Details   Frequency (times per week) 2   Duration Progress to 45 minutes of aerobic exercise without signs/symptoms of physical distress     Intensity   THRR 40-80% of Max Heartrate 62-125    Ratings of Perceived Exertion 11-13   Perceived Dyspnea 0-4     Progression   Progression Continue progressive overload as per policy without signs/symptoms or physical distress.     Resistance Training   Training Prescription Yes   Weight orange bands   Reps 10-12      Perform Capillary Blood Glucose checks as needed.  Exercise Prescription Changes:     Exercise Prescription Changes    Row Name 02/22/16 1500 02/24/16 1500 02/29/16 1600 03/02/16 1500 03/07/16 1500     Exercise Review   Progression  -  - Yes  -  -     Response to Exercise   Blood Pressure (Admit) 110/62 112/60 96/58 128/70 108/62   Blood Pressure (Exercise) 104/60 130/64 112/80 96/62 98/58    Blood Pressure (Exit) 98/60 108/82 98/60 94/60  92/54   Heart Rate (Admit) 77 bpm 76 bpm 79 bpm 82 bpm 67 bpm   Heart Rate (Exercise) 96 bpm 92 bpm 102 bpm 81 bpm 87 bpm   Heart Rate (Exit) 68 bpm 68 bpm 74 bpm 66 bpm 71 bpm   Oxygen Saturation (Admit) 95 % 99 % 99 % 98 % 99 %   Oxygen Saturation (Exercise) 96 % 98 % 95 % 98 % 98 %   Oxygen Saturation (Exit) 93 % 92 % 98 % 97 % 98 %   Rating of Perceived Exertion (Exercise) 15 13 16 17 15    Perceived Dyspnea (Exercise) 2 3 3 3 2    Duration Progress to 45 minutes of aerobic exercise without signs/symptoms of physical distress Progress to 45 minutes of aerobic exercise without signs/symptoms of physical distress Progress to 45 minutes of aerobic exercise without signs/symptoms of physical distress Progress to 45 minutes of aerobic exercise without signs/symptoms of physical distress Progress to 45 minutes of aerobic exercise without signs/symptoms of physical distress   Intensity -  40-80% HRR -  40-80% HRR -  40-80% HRR -  40-80% HRR THRR unchanged     Progression   Progression Continue to progress workloads to maintain intensity without signs/symptoms of physical distress. Continue to progress workloads to maintain intensity without signs/symptoms of physical  distress. Continue to progress workloads to maintain intensity without signs/symptoms of physical distress. Continue to progress workloads to maintain intensity without signs/symptoms of physical distress. Continue to progress workloads to maintain intensity without signs/symptoms of physical distress.     Resistance Training   Training Prescription Yes Yes Yes Yes Yes   Weight orange bands orange bands orange bands orange bands orange bands   Reps 10-12  10 minutes of strength training 10-12  10 minutes of strength training 10-12  10 minutes of strength training 10-12  10 minutes of strength training 10-12  10 minutes of strength training     Interval Training   Interval Training No No No No No     NuStep   Level 1  - 2 2 2    Minutes 17  - 17 17 17    METs 2.2  - 1.9 2.2 1.9  Rower   Level 1 1 4 4 1    Minutes 17 17 17 17 17      Track   Laps 13 14 16   - 14   Minutes 17 17 17   - 17      Exercise Comments:     Exercise Comments    Row Name 03/02/16 1002           Exercise Comments Patient has only attended three exercise sessions. Will cont. to monitor.           Discharge Exercise Prescription (Final Exercise Prescription Changes):     Exercise Prescription Changes - 03/07/16 1500      Response to Exercise   Blood Pressure (Admit) 108/62   Blood Pressure (Exercise) 98/58   Blood Pressure (Exit) 92/54   Heart Rate (Admit) 67 bpm   Heart Rate (Exercise) 87 bpm   Heart Rate (Exit) 71 bpm   Oxygen Saturation (Admit) 99 %   Oxygen Saturation (Exercise) 98 %   Oxygen Saturation (Exit) 98 %   Rating of Perceived Exertion (Exercise) 15   Perceived Dyspnea (Exercise) 2   Duration Progress to 45 minutes of aerobic exercise without signs/symptoms of physical distress   Intensity THRR unchanged     Progression   Progression Continue to progress workloads to maintain intensity without signs/symptoms of physical distress.     Resistance Training   Training  Prescription Yes   Weight orange bands   Reps 10-12  10 minutes of strength training     Interval Training   Interval Training No     NuStep   Level 2   Minutes 17   METs 1.9     Rower   Level 1   Minutes 17     Track   Laps 14   Minutes 17       Nutrition:  Target Goals: Understanding of nutrition guidelines, daily intake of sodium <1573m, cholesterol <2059m calories 30% from fat and 7% or less from saturated fats, daily to have 5 or more servings of fruits and vegetables.  Biometrics:     Pre Biometrics - 02/07/16 1505      Pre Biometrics   Grip Strength 20 kg       Nutrition Therapy Plan and Nutrition Goals:   Nutrition Discharge: Rate Your Plate Scores:   Psychosocial: Target Goals: Acknowledge presence or absence of depression, maximize coping skills, provide positive support system. Participant is able to verbalize types and ability to use techniques and skills needed for reducing stress and depression.  Initial Review & Psychosocial Screening:     Initial Psych Review & Screening - 02/07/16 [id number]     Barriers   Psychosocial barriers to participate in program There are no identifiable barriers or psychosocial needs.;The patient should benefit from training in stress management and relaxation.     Screening Interventions   Interventions Encouraged to exercise      Quality of Life Scores:     Quality of Life - 02/17/16 1401      Quality of Life Scores   Health/Function Pre 16.8 %   Socioeconomic Pre 23.11 %   Psych/Spiritual Pre 22.5 %   Family Pre 16.5 %   GLOBAL Pre 19.62 %      PHQ-9: Recent Review Flowsheet Data    [facility] 02/[facility]2016   Decreased Interest 1   Down, Depressed, Hopeless 0  PHQ - 2 Score 1      Psychosocial Evaluation and Intervention:   Psychosocial Re-Evaluation:     Psychosocial Re-Evaluation    [place] Name 03/02/16 1111              Psychosocial Re-Evaluation   Interventions Encouraged to attend Pulmonary Rehabilitation for the exercise       Comments No psychosocial issues identified       Continued Psychosocial Services Needed No         Education: Education Goals: Education classes will be provided on a weekly basis, covering required topics. Participant will state understanding/return demonstration of topics presented.  Learning Barriers/Preferences:     Learning Barriers/Preferences - 02/07/16 1450      Learning Barriers/Preferences   Learning Barriers None   Learning Preferences Group Instruction;Written Material;Individual Instruction      Education Topics: Risk Factor Reduction:  -Group instruction that is supported by a PowerPoint presentation. Instructor discusses the definition of a risk factor, different risk factors for pulmonary disease, and how the heart and lungs work together.     Nutrition for Pulmonary Patient:  -Group instruction provided by PowerPoint slides, verbal discussion, and written materials to support subject matter. The instructor gives an explanation and review of healthy diet recommendations, which includes a discussion on weight management, recommendations for fruit and vegetable consumption, as well as protein, fluid, caffeine, fiber, sodium, sugar, and alcohol. Tips for eating when patients are short of breath are discussed.   Pursed Lip Breathing:  -Group instruction that is supported by demonstration and informational handouts. Instructor discusses the benefits of pursed lip and diaphragmatic breathing and detailed demonstration on how to preform both.     Oxygen Safety:  -Group instruction provided by PowerPoint, verbal discussion, and written material to support subject matter. There is an overview of "What is Oxygen" and "Why do we need it".  Instructor also reviews how to create a safe environment for oxygen use, the importance of using oxygen as prescribed, and the  risks of noncompliance. There is a brief discussion on traveling with oxygen and resources the patient may utilize.   Oxygen Equipment:  -Group instruction provided by [facility] Staff utilizing handouts, written materials, and equipment demonstrations.   Signs and Symptoms:  -Group instruction provided by written material and verbal discussion to support subject matter. Warning signs and symptoms of infection, stroke, and heart attack are reviewed and when to call the physician/911 reinforced. Tips for preventing the spread of infection discussed.   Advanced Directives:  -Group instruction provided by verbal instruction and written material to support subject matter. Instructor reviews Advanced Directive laws and proper instruction for filling out document.   Pulmonary Video:  -Group video education that reviews the importance of medication and oxygen compliance, exercise, good nutrition, pulmonary hygiene, and pursed lip and diaphragmatic breathing for the pulmonary patient.   Exercise for the Pulmonary Patient:  -Group instruction that is supported by a PowerPoint presentation. Instructor discusses benefits of exercise, core components of exercise, frequency, duration, and intensity of an exercise routine, importance of utilizing pulse oximetry during exercise, safety while exercising, and options of places to exercise outside of rehab.     Pulmonary Medications:  -Verbally interactive group education provided by instructor with focus on inhaled medications and proper administration.   Anatomy and Physiology of the Respiratory System and Intimacy:  -Group instruction provided by PowerPoint, verbal discussion, and written material to support subject matter. Instructor reviews respiratory cycle and anatomical components of  the respiratory system and their functions. Instructor also reviews differences in obstructive and restrictive respiratory diseases with examples of each. Intimacy,  Sex, and Sexuality differences are reviewed with a discussion on how relationships can change when diagnosed with pulmonary disease. Common sexual concerns are reviewed. Flowsheet Row PULMONARY REHAB OTHER RESPIRATORY from 03/02/2016 in [place]  Date  02/24/16  Educator  rn  Instruction Review Code  2- meets goals/outcomes      Knowledge Questionnaire Score:     Knowledge Questionnaire Score - 02/17/16 1400      Knowledge Questionnaire Score   Pre Score 12/13      Core Components/Risk Factors/Patient Goals at Admission:     Personal Goals and Risk Factors at Admission - 02/07/16 1456      Core Components/Risk Factors/Patient Goals on Admission   Improve shortness of breath with ADL's Yes   Intervention Provide education, individualized exercise plan and daily activity instruction to help decrease symptoms of SOB with activities of daily living.   Expected Outcomes Short Term: Achieves a reduction of symptoms when performing activities of daily living.   Develop more efficient breathing techniques such as purse lipped breathing and diaphragmatic breathing; and practicing self-pacing with activity Yes   Intervention Provide education, demonstration and support about specific breathing techniuqes utilized for more efficient breathing. Include techniques such as pursed lipped breathing, diaphragmatic breathing and self-pacing activity.   Expected Outcomes Short Term: Participant will be able to demonstrate and use breathing techniques as needed throughout daily activities.      Core Components/Risk Factors/Patient Goals Review:      Goals and Risk Factor Review    Row Name 03/02/16 1109             Core Components/Risk Factors/Patient Goals Review   Personal Goals Review Develop more efficient breathing techniques such as purse lipped breathing and diaphragmatic breathing and practicing self-pacing with activity.;Improve shortness of breath  with ADL's       Review has attended 3 exercise sessions, too early to see improvement       Expected Outcomes expect to see progress in the next 30 days          Core Components/Risk Factors/Patient Goals at Discharge (Final Review):      Goals and Risk Factor Review - 03/02/16 1109      Core Components/Risk Factors/Patient Goals Review   Personal Goals Review Develop more efficient breathing techniques such as purse lipped breathing and diaphragmatic breathing and practicing self-pacing with activity.;Improve shortness of breath with ADL's   Review has attended 3 exercise sessions, too early to see improvement   Expected Outcomes expect to see progress in the next 30 days      ITP Comments:   Comments:ITP REVIEW Pt is making expected progress toward pulmonary rehab goals after completing 5 sessions. Recommend continued exercise, life style modification, education, and utilization of breathing techniques to increase stamina and strength and decrease shortness of breath with exertion.

## 2016-03-14 ENCOUNTER — Encounter (HOSPITAL_COMMUNITY)
Admission: RE | Admit: 2016-03-14 | Discharge: 2016-03-14 | Disposition: A | Discharge status: Home / Self Care | Payer: Medicare HMO | Source: Ambulatory Visit | Attending: Pulmonary Disease | Admitting: Pulmonary Disease

## 2016-03-14 VITALS — Wt 129.6 lb

## 2016-03-14 DIAGNOSIS — J8410 Pulmonary fibrosis, unspecified: Secondary | ICD-10-CM | POA: Diagnosis not present

## 2016-03-14 NOTE — Progress Notes (Signed)
Daily Session Note  Patient Details  Name: [name] [name] MRN: [id number] Date of Birth: [birth year] Referring Provider:   April Manson Pulmonary Rehab Walk Test from 02/15/2016 in [place]  Referring Provider  Dr. Nelda Marseille      Encounter Date: 03/14/2016  Check In:     Session Check In - 03/14/16 1316      Check-In   Location [facility]   Staff Present Rosebud Poles, RN, BSN;Molly diVincenzo, MS, ACSM RCEP, Exercise Physiologist;Lisa Ysidro Evert, RN   Supervising physician immediately available to respond to emergencies [facility] immediately available   Physician(s) Dr. Quincy Simmonds   Medication changes reported     No   Fall or balance concerns reported    No   Warm-up and Cool-down Performed as group-led instruction   Resistance Training Performed Yes   VAD Patient? No     Pain Assessment   Currently in Pain? No/denies   Multiple Pain Sites No      Capillary Blood Glucose: No results found for this or any previous visit (from the past 24 hour(s)).      Exercise Prescription Changes - 03/14/16 1500      Response to Exercise   Blood Pressure (Admit) 110/68   Blood Pressure (Exercise) 100/60   Blood Pressure (Exit) 96/60   Heart Rate (Admit) 76 bpm   Heart Rate (Exercise) 94 bpm   Heart Rate (Exit) 72 bpm   Oxygen Saturation (Admit) 98 %   Oxygen Saturation (Exercise) 98 %   Oxygen Saturation (Exit) 95 %   Rating of Perceived Exertion (Exercise) 15   Perceived Dyspnea (Exercise) 3   Duration Progress to 45 minutes of aerobic exercise without signs/symptoms of physical distress   Intensity THRR unchanged     Progression   Progression Continue to progress workloads to maintain intensity without signs/symptoms of physical distress.     Resistance Training   Training Prescription Yes   Weight orange bands   Reps 10-12  10 minutes of strength training     Interval Training   Interval Training No     NuStep   Level  2   Minutes 17   METs 1.8     Rower   Level 1   Minutes 17     Track   Laps 15   Minutes 17     Goals Met:  Exercise tolerated well Strength training completed today  Goals Unmet:  Not Applicable  Comments: Service time is from 1330 to 1500    Dr. Rush Farmer is Medical Director for Pulmonary Rehab at [facility].

## 2016-03-16 ENCOUNTER — Encounter (HOSPITAL_COMMUNITY)
Admission: RE | Admit: 2016-03-16 | Discharge: 2016-03-16 | Disposition: A | Discharge status: Home / Self Care | Payer: Medicare HMO | Source: Ambulatory Visit | Attending: Pulmonary Disease | Admitting: Pulmonary Disease

## 2016-03-16 VITALS — Wt 129.9 lb

## 2016-03-16 DIAGNOSIS — J8410 Pulmonary fibrosis, unspecified: Secondary | ICD-10-CM | POA: Diagnosis not present

## 2016-03-16 NOTE — Progress Notes (Signed)
Daily Session Note  Patient Details  Name: [name] [name] MRN: [id number] Date of Birth: 1950-10-27 Referring Provider:   April Manson Pulmonary Rehab Walk Test from 02/15/2016 in [place]  Referring Provider  Dr. Nelda Marseille      Encounter Date: 03/16/2016  Check In:     Session Check In - 03/16/16 1335      Check-In   Location [facility]   Staff Present Rosebud Poles, RN, BSN;Molly diVincenzo, MS, ACSM RCEP, Exercise Physiologist;Portia Rollene Rotunda, RN, Roque Cash, RN   Supervising physician immediately available to respond to emergencies [facility] immediately available   Physician(s) Dr. Tana Coast   Medication changes reported     No   Fall or balance concerns reported    No   Warm-up and Cool-down Performed as group-led instruction   Resistance Training Performed Yes   VAD Patient? No     Pain Assessment   Currently in Pain? No/denies   Multiple Pain Sites No      Capillary Blood Glucose: No results found for this or any previous visit (from the past 24 hour(s)).      Exercise Prescription Changes - 03/16/16 1500      Response to Exercise   Blood Pressure (Admit) 112/60   Blood Pressure (Exercise) 96/64   Blood Pressure (Exit) 100/64   Heart Rate (Admit) 68 bpm   Heart Rate (Exercise) 84 bpm   Heart Rate (Exit) 78 bpm   Oxygen Saturation (Admit) 100 %   Oxygen Saturation (Exercise) 97 %   Oxygen Saturation (Exit) 98 %   Rating of Perceived Exertion (Exercise) 17   Perceived Dyspnea (Exercise) 3   Duration Progress to 45 minutes of aerobic exercise without signs/symptoms of physical distress   Intensity THRR unchanged     Progression   Progression Continue to progress workloads to maintain intensity without signs/symptoms of physical distress.     Resistance Training   Training Prescription Yes   Weight orange bands   Reps 10-12  10 minutes of strength training     Interval Training   Interval Training No      Rower   Level 1   Minutes 17     Track   Laps 16   Minutes 17     Goals Met:  Exercise tolerated well No report of cardiac concerns or symptoms Strength training completed today  Goals Unmet:  Not Applicable  Comments: Service time is from 1330 to 1530    Dr. Rush Farmer is Medical Director for Pulmonary Rehab at [facility].

## 2016-03-17 ENCOUNTER — Ambulatory Visit (INDEPENDENT_AMBULATORY_CARE_PROVIDER_SITE_OTHER): Payer: Medicare HMO | Admitting: Cardiology

## 2016-03-17 ENCOUNTER — Encounter: Payer: Self-pay | Admitting: *Deleted

## 2016-03-17 VITALS — BP 124/60 | HR 75 | Ht 59.0 in | Wt 130.0 lb

## 2016-03-17 DIAGNOSIS — R0789 Other chest pain: Secondary | ICD-10-CM | POA: Insufficient documentation

## 2016-03-17 DIAGNOSIS — N816 Rectocele: Secondary | ICD-10-CM | POA: Insufficient documentation

## 2016-03-17 DIAGNOSIS — B20 Human immunodeficiency virus [HIV] disease: Secondary | ICD-10-CM | POA: Insufficient documentation

## 2016-03-17 DIAGNOSIS — E785 Hyperlipidemia, unspecified: Secondary | ICD-10-CM | POA: Diagnosis not present

## 2016-03-17 DIAGNOSIS — J8410 Pulmonary fibrosis, unspecified: Secondary | ICD-10-CM | POA: Diagnosis not present

## 2016-03-17 DIAGNOSIS — F411 Generalized anxiety disorder: Secondary | ICD-10-CM | POA: Insufficient documentation

## 2016-03-17 DIAGNOSIS — Z01812 Encounter for preprocedural laboratory examination: Secondary | ICD-10-CM

## 2016-03-17 DIAGNOSIS — D126 Benign neoplasm of colon, unspecified: Secondary | ICD-10-CM | POA: Insufficient documentation

## 2016-03-17 DIAGNOSIS — R001 Bradycardia, unspecified: Secondary | ICD-10-CM | POA: Diagnosis not present

## 2016-03-17 DIAGNOSIS — R072 Precordial pain: Secondary | ICD-10-CM | POA: Diagnosis not present

## 2016-03-17 DIAGNOSIS — E041 Nontoxic single thyroid nodule: Secondary | ICD-10-CM | POA: Insufficient documentation

## 2016-03-17 DIAGNOSIS — I4949 Other premature depolarization: Secondary | ICD-10-CM | POA: Insufficient documentation

## 2016-03-17 DIAGNOSIS — R079 Chest pain, unspecified: Secondary | ICD-10-CM

## 2016-03-17 DIAGNOSIS — R42 Dizziness and giddiness: Secondary | ICD-10-CM

## 2016-03-17 MED ORDER — ASPIRIN EC 81 MG PO TBEC: 81.0000 mg | DELAYED_RELEASE_TABLET | Freq: Every day | ORAL | 3 refills | Status: AC

## 2016-03-17 NOTE — Addendum Note (Signed)
Addended by: Dorothy Spark on: 03/17/2016 12:54 PM   Modules accepted: Orders, SmartSet

## 2016-03-17 NOTE — Progress Notes (Signed)
Cardiology Office Note    Date:  03/17/2016   ID:  [name], DOB 16-Nov-1950, MRN [id number]  PCP:  No PCP Per Patient  Cardiologist:   Ena Dawley, MD   Chief complain: Dyspnea on exertion, bradycardia, dizziness, syncope.  History of Present Illness:  [name] [name] is a 66 y.o. female with very complicated medical history including nonobstructive CAD on cardiac cath over 10 years ago. She also history of HIV positivity being treated with anti-retroviral therapy with undetectable viral loads, history of idiopathic pulmonary fibrosis currently being evaluated for lung transplant at [facility]. H/O smoking, quit 23 years ago. The patient states that in the last year and predominantly in the last few months she has developed significant retrosternal chest tightness with exertion, it resolves at rest. She states that she is limited with activities of daily living and gets short of breath and chest tightness when walking short distances. She went to the ER twice during the last year and was noticed to have bradycardia down to 40s on no AVN blocking agents that she feels the dizzy 2 to 3 times a day while walking and one occasion she passed out while standing up. Denies LE edema, orthopnea, PND.   Past Medical History:  Diagnosis Date  . Asthma   . Bradycardia    Sinus bradycardia  . CAD (coronary artery disease)    Minimal by history, Question coronary artery spasm  . Colon polyps   . Dyslipidemia   . Ejection fraction   . GERD (gastroesophageal reflux disease)    Esophageal stricture  . H/O: hysterectomy   . HIV positive ([place])    Needle stick 1993  . Hyperlipidemia   . Palpitations   . Peptic ulcer disease   . Shortness of breath    Extensive evaluation Dr. Winona Legato, [facility], [place], [id number], etiology uncertain, probable bronchiolitis possibility of lung biopsy at that time  . Weight loss     Past Surgical History:  Procedure Laterality Date  . APPENDECTOMY    . CHOLECYSTECTOMY      . GASTRECTOMY    . VENTRAL HERNIA REPAIR     Current Medications: Outpatient Medications Prior to Visit  Medication Sig Dispense Refill  . albuterol (PROVENTIL HFA;VENTOLIN HFA) 108 (90 Base) MCG/ACT inhaler INHALE 2 PUFFS BY MOUTH AS DIRECTED AS NEEDED FOR SHORTNESS OF BREATH    . amitriptyline (ELAVIL) 50 MG tablet Take 50 mg by mouth at bedtime.      . budesonide-formoterol (SYMBICORT) 160-4.5 MCG/ACT inhaler Inhale 2 puffs into the lungs 2 (two) times daily as needed.     . clobetasol ointment (TEMOVATE) 0.05 % APPLY TO AFFECTED FINGER 1 TO 2 TIMES DAILY WITH AT LEAST 1 APPLICATION UNDER OCCLUSION    . conjugated estrogens (PREMARIN) vaginal cream Use 1/4 applicator nightly and then 3 times weekly thereafter    . docusate sodium (COLACE) 100 MG capsule Take 100 mg by mouth daily as needed.     Marland Kitchen emtricitabine-tenofovir (TRUVADA) 200-300 MG tablet TAKE 1 TABLET BY MOUTH EVERY DAY    . omeprazole (PRILOSEC) 40 MG capsule Take 40 mg by mouth every morning.     . Pirfenidone 267 MG CAPS Take 534 mg by mouth 2 (two) times daily.     . polyethylene glycol (MIRALAX / GLYCOLAX) packet Take 17 g by mouth daily as needed.     . pravastatin (PRAVACHOL) 80 MG tablet Take 1 tablet by mouth daily.    . raltegravir (ISENTRESS) 400  MG tablet TAKE 1 TABLET BY MOUTH TWICE DAILY    . senna-docusate (SENOKOT-S) 8.6-50 MG tablet Take 1 tablet by mouth daily as needed.     . sucralfate (CARAFATE) 1 GM/10ML suspension Take 1 g by mouth 2 (two) times daily.      No facility-administered medications prior to visit.      Allergies:   Sulfamethoxazole-trimethoprim   Social History   Social History  . Marital status: Widowed    Spouse name: N/A  . Number of children: 3  . Years of education: N/A   Occupational History  . full-time [employer]   Social History Main Topics  . Smoking status: Former Smoker    Quit date: 03/13/1996  . Smokeless tobacco: Never Used  . Alcohol  use No  . Drug use: No  . Sexual activity: Not on file   Other Topics Concern  . Not on file   Social History Narrative  . No narrative on file     Family History:  The patient's family history includes Bradycardia in her brother and sister; Diabetes in her mother; Heart attack in her mother; Heart disease in her mother; Hypertension in her mother; Stroke in her mother.   ROS:   Please see the history of present illness.    ROS All other systems reviewed and are negative.   PHYSICAL EXAM:   VS:  BP 124/60   Pulse 75   Ht 4\' 11"  (1.499 m)   Wt 130 lb (59 kg)   BMI 26.26 kg/m    GEN: Well nourished, well developed, in no acute distress  HEENT: normal  Neck: no JVD, carotid bruits, or masses Cardiac: RRR; no murmurs, rubs, or gallops,no edema  Respiratory:  Dry crackles at bases bilaterally, normal work of breathing GI: soft, nontender, nondistended, + BS MS: no deformity or atrophy  Skin: warm and dry, no rash Neuro:  Alert and Oriented x 3, Strength and sensation are intact Psych: euthymic mood, full affect  Wt Readings from Last 3 Encounters:  03/17/16 130 lb (59 kg)  03/16/16 129 lb 13.6 oz (58.9 kg)  03/14/16 129 lb 10.1 oz (58.8 kg)      Studies/Labs Reviewed:   EKG:  EKG is ordered today.  The ekg ordered today demonstrates Sinus rhythm, intra-infarct age undetermined, anterolateral infarct age undetermined.  Recent Labs: 08/28/2015: BUN 16; Creatinine, Ser 0.86; Hemoglobin 13.2; Platelets 260; Potassium 4.3; Sodium 140   Lipid Panel No results found for: CHOL, TRIG, HDL, CHOLHDL, VLDL, LDLCALC, LDLDIRECT  Additional studies/ records that were reviewed today include:  No echocardiogram since 2003.    ASSESSMENT:    1. Bradycardia   2. Dyslipidemia   3. Pulmonary interstitial fibrosis ([place])   4. Symptomatic bradycardia   5. Pre-procedure lab exam   6. Precordial pain   7. Dizziness      PLAN:  In order of problems listed above:  1. This  patient has a typical exertional chest tightness and new EKG changes with suspicion for inferior and anterolateral infarct were not present in 2014. She is also being worked up for lung transplant.. Therefore we will schedule left and right-sided cath. We will start aspirin 81 mg daily. 2. Symptomatic bradycardia - with an episode of syncope earlier this year and several episodes of dizziness every day, no AV nodal blocking agent, we will start 48 hour Holter monitor.  3.  Idiopathic pulmonary fibrosis workup for lung transplant, we'll order echocardiogram to evaluate left  and right-sided ventricular function. 4. HLP also notes her P right now we will recheck her lipids.  5. HIV - Patient is HIV positive from a needle stick in 1993. Fortunately she is on appropriate medications and doing well.  Follow up in 2 months.   Medication Adjustments/Labs and Tests Ordered: Current medicines are reviewed at length with the patient today.  Concerns regarding medicines are outlined above.  Medication changes, Labs and Tests ordered today are listed in the Patient Instructions below. Patient Instructions  Medication Instructions:   START TAKING ASPIRIN 81 MG ONCE DAILY   Labwork:  TODAY--PRE-CATH LABS---BMET, CBC W DIFF, AND PT/INR   Testing/Procedures:  Your physician has requested that you have an echocardiogram. Echocardiography is a painless test that uses sound waves to create images of your heart. It provides your doctor with information about the size and shape of your heart and how well your heart's chambers and valves are working. This procedure takes approximately one hour. There are no restrictions for this procedure.   Your physician has recommended that you wear a 48 HOUR holter monitor. Holter monitors are medical devices that record the heart's electrical activity. Doctors most often use these monitors to diagnose arrhythmias. Arrhythmias are problems with the speed or rhythm of the  heartbeat. The monitor is a small, portable device. You can wear one while you do your normal daily activities. This is usually used to diagnose what is causing palpitations/syncope (passing out).   Your physician has requested that you have a cardiac catheterization. Cardiac catheterization is used to diagnose and/or treat various heart conditions. Doctors may recommend this procedure for a number of different reasons. The most common reason is to evaluate chest pain. Chest pain can be a symptom of coronary artery disease (CAD), and cardiac catheterization can show whether plaque is narrowing or blocking your heart's arteries. This procedure is also used to evaluate the valves, as well as measure the blood flow and oxygen levels in different parts of your heart. For further information please visit HugeFiesta.tn. Please follow instruction sheet, as given.  YOUR CATH IS SCHEDULED FOR NEXT Tuesday 03/21/16 AT 7 AM FOR DR SMITH TO DO.  PLEASE ARRIVE AT [place] AT 7 AM.  PLEASE FOLLOW INSTRUCTION LETTER PROVIDED.      Follow-Up:  AT DR Elba Dendinger'S NEXT AVAILABLE APPOINTMENT     If you need a refill on your cardiac medications before your next appointment, please call your pharmacy.      Signed, Ena Dawley, MD  03/17/2016 11:34 AM    [address], [place], [place]  [address] Phone: [phone]; Fax: [phone]

## 2016-03-17 NOTE — Patient Instructions (Addendum)
Medication Instructions:   START TAKING ASPIRIN 81 MG ONCE DAILY   Labwork:  TODAY--PRE-CATH LABS---BMET, CBC W DIFF, AND PT/INR   Testing/Procedures:  Your physician has requested that you have an echocardiogram. Echocardiography is a painless test that uses sound waves to create images of your heart. It provides your doctor with information about the size and shape of your heart and how well your heart's chambers and valves are working. This procedure takes approximately one hour. There are no restrictions for this procedure.   Your physician has recommended that you wear a 48 HOUR holter monitor. Holter monitors are medical devices that record the heart's electrical activity. Doctors most often use these monitors to diagnose arrhythmias. Arrhythmias are problems with the speed or rhythm of the heartbeat. The monitor is a small, portable device. You can wear one while you do your normal daily activities. This is usually used to diagnose what is causing palpitations/syncope (passing out).   Your physician has requested that you have a cardiac catheterization. Cardiac catheterization is used to diagnose and/or treat various heart conditions. Doctors may recommend this procedure for a number of different reasons. The most common reason is to evaluate chest pain. Chest pain can be a symptom of coronary artery disease (CAD), and cardiac catheterization can show whether plaque is narrowing or blocking your heart's arteries. This procedure is also used to evaluate the valves, as well as measure the blood flow and oxygen levels in different parts of your heart. For further information please visit HugeFiesta.tn. Please follow instruction sheet, as given.  YOUR CATH IS SCHEDULED FOR NEXT Tuesday 03/21/16 AT 7 AM FOR DR SMITH TO DO.  PLEASE ARRIVE AT [place] AT 7 AM.  PLEASE FOLLOW INSTRUCTION LETTER PROVIDED.      Follow-Up:  AT DR NELSON'S NEXT AVAILABLE  APPOINTMENT     If you need a refill on your cardiac medications before your next appointment, please call your pharmacy.

## 2016-03-18 LAB — BASIC METABOLIC PANEL
BUN/Creatinine Ratio: 16 (ref 12–28)
BUN: 13 mg/dL (ref 8–27)
CO2: 17 mmol/L — ABNORMAL LOW (ref 18–29)
Calcium: 8.8 mg/dL (ref 8.7–10.3)
Chloride: 110 mmol/L — ABNORMAL HIGH (ref 96–106)
Creatinine, Ser: 0.81 mg/dL (ref 0.57–1.00)
GFR calc Af Amer: 89 mL/min/{1.73_m2} (ref >59)
GFR calc non Af Amer: 77 mL/min/{1.73_m2} (ref >59)
Glucose: 88 mg/dL (ref 65–99)
Potassium: 4.4 mmol/L (ref 3.5–5.2)
Sodium: 146 mmol/L — ABNORMAL HIGH (ref 134–144)

## 2016-03-18 LAB — CBC WITH DIFFERENTIAL/PLATELET
Basophils Absolute: 0 10*3/uL (ref 0.0–0.2)
Basos: 0 %
EOS (ABSOLUTE): 0.1 10*3/uL (ref 0.0–0.4)
Eos: 2 %
Hematocrit: 40.5 % (ref 34.0–46.6)
Hemoglobin: 13.6 g/dL (ref 11.1–15.9)
Immature Grans (Abs): 0 10*3/uL (ref 0.0–0.1)
Immature Granulocytes: 0 %
Lymphocytes Absolute: 1.4 10*3/uL (ref 0.7–3.1)
Lymphs: 22 %
MCH: 30.6 pg (ref 26.6–33.0)
MCHC: 33.6 g/dL (ref 31.5–35.7)
MCV: 91 fL (ref 79–97)
Monocytes Absolute: 0.7 10*3/uL (ref 0.1–0.9)
Monocytes: 11 %
Neutrophils Absolute: 4 10*3/uL (ref 1.4–7.0)
Neutrophils: 65 %
Platelets: 255 10*3/uL (ref 150–379)
RBC: 4.45 x10E6/uL (ref 3.77–5.28)
RDW: 14 % (ref 12.3–15.4)
WBC: 6.3 10*3/uL (ref 3.4–10.8)

## 2016-03-18 LAB — PROTIME-INR

## 2016-03-20 ENCOUNTER — Telehealth: Payer: Self-pay | Admitting: Interventional Cardiology

## 2016-03-20 DIAGNOSIS — R0602 Shortness of breath: Secondary | ICD-10-CM

## 2016-03-20 NOTE — Telephone Encounter (Signed)
New Message     Pt returning call about Cath procedure 03/21/16 Dr Tamala Julian

## 2016-03-20 NOTE — Telephone Encounter (Signed)
Will route to Dr. Francesca Oman primary nurse as this is a Meda Coffee pt.

## 2016-03-20 NOTE — Telephone Encounter (Signed)
Notified the pt that per Dr Meda Coffee, all her labs were normal, and her INR was cancelled,but will be drawn in the hospital tomorrow, prior to her cath.  Pt verbalized understanding and agrees with this plan.

## 2016-03-21 ENCOUNTER — Encounter (HOSPITAL_COMMUNITY): Admission: RE | Admit: 2016-03-21 | Payer: Medicare HMO | Source: Ambulatory Visit

## 2016-03-21 ENCOUNTER — Ambulatory Visit (HOSPITAL_COMMUNITY)
Admission: RE | Admit: 2016-03-21 | Discharge: 2016-03-21 | Disposition: A | Discharge status: Home / Self Care | Payer: Medicare HMO | Source: Ambulatory Visit | Attending: Interventional Cardiology | Admitting: Interventional Cardiology

## 2016-03-21 ENCOUNTER — Encounter (HOSPITAL_COMMUNITY)
Admission: RE | Disposition: A | Discharge status: Home / Self Care | Payer: Self-pay | Source: Ambulatory Visit | Attending: Interventional Cardiology

## 2016-03-21 ENCOUNTER — Other Ambulatory Visit (HOSPITAL_COMMUNITY): Payer: Self-pay | Admitting: *Deleted

## 2016-03-21 DIAGNOSIS — Z7951 Long term (current) use of inhaled steroids: Secondary | ICD-10-CM | POA: Insufficient documentation

## 2016-03-21 DIAGNOSIS — Z833 Family history of diabetes mellitus: Secondary | ICD-10-CM | POA: Diagnosis not present

## 2016-03-21 DIAGNOSIS — R001 Bradycardia, unspecified: Secondary | ICD-10-CM | POA: Diagnosis not present

## 2016-03-21 DIAGNOSIS — K219 Gastro-esophageal reflux disease without esophagitis: Secondary | ICD-10-CM | POA: Diagnosis not present

## 2016-03-21 DIAGNOSIS — Z86010 Personal history of colonic polyps: Secondary | ICD-10-CM | POA: Diagnosis not present

## 2016-03-21 DIAGNOSIS — B20 Human immunodeficiency virus [HIV] disease: Secondary | ICD-10-CM | POA: Diagnosis present

## 2016-03-21 DIAGNOSIS — I25118 Atherosclerotic heart disease of native coronary artery with other forms of angina pectoris: Secondary | ICD-10-CM

## 2016-03-21 DIAGNOSIS — R0789 Other chest pain: Secondary | ICD-10-CM | POA: Diagnosis present

## 2016-03-21 DIAGNOSIS — J8410 Pulmonary fibrosis, unspecified: Secondary | ICD-10-CM | POA: Diagnosis present

## 2016-03-21 DIAGNOSIS — J84112 Idiopathic pulmonary fibrosis: Secondary | ICD-10-CM | POA: Diagnosis present

## 2016-03-21 DIAGNOSIS — Z823 Family history of stroke: Secondary | ICD-10-CM | POA: Insufficient documentation

## 2016-03-21 DIAGNOSIS — I2510 Atherosclerotic heart disease of native coronary artery without angina pectoris: Secondary | ICD-10-CM | POA: Diagnosis not present

## 2016-03-21 DIAGNOSIS — Z8711 Personal history of peptic ulcer disease: Secondary | ICD-10-CM | POA: Insufficient documentation

## 2016-03-21 DIAGNOSIS — Z87891 Personal history of nicotine dependence: Secondary | ICD-10-CM | POA: Diagnosis not present

## 2016-03-21 DIAGNOSIS — J45909 Unspecified asthma, uncomplicated: Secondary | ICD-10-CM | POA: Diagnosis not present

## 2016-03-21 DIAGNOSIS — R072 Precordial pain: Secondary | ICD-10-CM

## 2016-03-21 DIAGNOSIS — Z8249 Family history of ischemic heart disease and other diseases of the circulatory system: Secondary | ICD-10-CM | POA: Insufficient documentation

## 2016-03-21 DIAGNOSIS — E785 Hyperlipidemia, unspecified: Secondary | ICD-10-CM | POA: Diagnosis not present

## 2016-03-21 DIAGNOSIS — R079 Chest pain, unspecified: Secondary | ICD-10-CM

## 2016-03-21 HISTORY — PX: CARDIAC CATHETERIZATION: SHX172

## 2016-03-21 LAB — POCT I-STAT 3, VENOUS BLOOD GAS (G3P V)
ACID-BASE DEFICIT: 7 mmol/L — AB (ref 0.0–2.0)
BICARBONATE: 19.1 mmol/L — AB (ref 20.0–28.0)
O2 Saturation: 73 %
PO2 VEN: 42 mmHg (ref 32.0–45.0)
TCO2: 20 mmol/L (ref 0–100)
pCO2, Ven: 38.1 mmHg — ABNORMAL LOW (ref 44.0–60.0)
pH, Ven: 7.307 (ref 7.250–7.430)

## 2016-03-21 LAB — POCT I-STAT 3, ART BLOOD GAS (G3+)
Acid-base deficit: 6 mmol/L — ABNORMAL HIGH (ref 0.0–2.0)
BICARBONATE: 19.5 mmol/L — AB (ref 20.0–28.0)
O2 SAT: 99 %
PCO2 ART: 36.5 mmHg (ref 32.0–48.0)
TCO2: 21 mmol/L (ref 0–100)
pH, Arterial: 7.336 — ABNORMAL LOW (ref 7.350–7.450)
pO2, Arterial: 128 mmHg — ABNORMAL HIGH (ref 83.0–108.0)

## 2016-03-21 LAB — PROTIME-INR
INR: 1.04
PROTHROMBIN TIME: 13.6 s (ref 11.4–15.2)

## 2016-03-21 SURGERY — RIGHT/LEFT HEART CATH AND CORONARY ANGIOGRAPHY
Anesthesia: LOCAL

## 2016-03-21 MED ORDER — LIDOCAINE HCL (PF) 1 % IJ SOLN
INTRAMUSCULAR | Status: AC
  Filled 2016-03-21: qty 30

## 2016-03-21 MED ORDER — SODIUM CHLORIDE 0.9% FLUSH: 3.0000 mL | INTRAVENOUS | Status: DC | PRN

## 2016-03-21 MED ORDER — NITROGLYCERIN 0.4 MG SL SUBL
SUBLINGUAL_TABLET | SUBLINGUAL | Status: AC
  Administered 2016-03-21: 0.4 mg via SUBLINGUAL
  Filled 2016-03-21: qty 3

## 2016-03-21 MED ORDER — HEPARIN (PORCINE) IN NACL 2-0.9 UNIT/ML-% IJ SOLN
INTRAMUSCULAR | Status: AC
  Filled 2016-03-21: qty 500

## 2016-03-21 MED ORDER — VERAPAMIL HCL 2.5 MG/ML IV SOLN
INTRAVENOUS | Status: AC
  Filled 2016-03-21: qty 2

## 2016-03-21 MED ORDER — FENTANYL CITRATE (PF) 100 MCG/2ML IJ SOLN
INTRAMUSCULAR | Status: AC
  Filled 2016-03-21: qty 2

## 2016-03-21 MED ORDER — NITROGLYCERIN 0.4 MG SL SUBL
0.4000 mg | SUBLINGUAL_TABLET | SUBLINGUAL | Status: DC | PRN
  Administered 2016-03-21 (×3): 0.4 mg via SUBLINGUAL

## 2016-03-21 MED ORDER — FENTANYL CITRATE (PF) 100 MCG/2ML IJ SOLN
INTRAMUSCULAR | Status: DC | PRN
  Administered 2016-03-21: 50 ug via INTRAVENOUS

## 2016-03-21 MED ORDER — ASPIRIN 81 MG PO CHEW: 81.0000 mg | CHEWABLE_TABLET | ORAL | Status: DC

## 2016-03-21 MED ORDER — LIDOCAINE HCL (PF) 1 % IJ SOLN
INTRAMUSCULAR | Status: DC | PRN
  Administered 2016-03-21: 18 mL via INTRADERMAL

## 2016-03-21 MED ORDER — HEPARIN SODIUM (PORCINE) 1000 UNIT/ML IJ SOLN
INTRAMUSCULAR | Status: AC
  Filled 2016-03-21: qty 1

## 2016-03-21 MED ORDER — SODIUM CHLORIDE 0.9 % IV SOLN: 250.0000 mL | INTRAVENOUS | Status: DC | PRN

## 2016-03-21 MED ORDER — IOPAMIDOL (ISOVUE-370) INJECTION 76%
INTRAVENOUS | Status: AC
  Filled 2016-03-21: qty 100

## 2016-03-21 MED ORDER — SODIUM CHLORIDE 0.9 % WEIGHT BASED INFUSION
1.0000 mL/kg/h | INTRAVENOUS | Status: DC
Start: 2016-03-21 — End: 2016-03-21

## 2016-03-21 MED ORDER — MIDAZOLAM HCL 2 MG/2ML IJ SOLN
INTRAMUSCULAR | Status: DC | PRN
  Administered 2016-03-21: 1 mg via INTRAVENOUS

## 2016-03-21 MED ORDER — NITROGLYCERIN 0.4 MG SL SUBL: 0.4000 mg | SUBLINGUAL_TABLET | SUBLINGUAL | 12 refills | Status: DC | PRN

## 2016-03-21 MED ORDER — ISOSORBIDE MONONITRATE ER 30 MG PO TB24: 30.0000 mg | ORAL_TABLET | Freq: Every day | ORAL | 12 refills | Status: DC

## 2016-03-21 MED ORDER — SODIUM CHLORIDE 0.9% FLUSH: 3.0000 mL | Freq: Two times a day (BID) | INTRAVENOUS | Status: DC

## 2016-03-21 MED ORDER — HEPARIN (PORCINE) IN NACL 2-0.9 UNIT/ML-% IJ SOLN
INTRAMUSCULAR | Status: DC | PRN
  Administered 2016-03-21: 1000 mL

## 2016-03-21 MED ORDER — NITROGLYCERIN 0.4 MG/SPRAY TL SOLN
Status: AC
  Filled 2016-03-21: qty 4.9

## 2016-03-21 MED ORDER — MIDAZOLAM HCL 2 MG/2ML IJ SOLN
INTRAMUSCULAR | Status: AC
  Filled 2016-03-21: qty 2

## 2016-03-21 MED ORDER — SODIUM CHLORIDE 0.9 % WEIGHT BASED INFUSION: 3.0000 mL/kg/h | INTRAVENOUS | Status: AC

## 2016-03-21 MED ORDER — IOPAMIDOL (ISOVUE-370) INJECTION 76%
INTRAVENOUS | Status: DC | PRN
  Administered 2016-03-21: 85 mL via INTRA_ARTERIAL

## 2016-03-21 SURGICAL SUPPLY — 11 items
CATH INFINITI 5FR MULTPACK ANG (CATHETERS) IMPLANT
CATH SITESEER 5F MULTI A 2 (CATHETERS) ×1 IMPLANT
CATH SWAN GANZ 7F STRAIGHT (CATHETERS) ×2 IMPLANT
DEVICE RAD COMP TR BAND LRG (VASCULAR PRODUCTS) IMPLANT
KIT HEART LEFT (KITS) ×2 IMPLANT
PACK CARDIAC CATHETERIZATION (CUSTOM PROCEDURE TRAY) ×2 IMPLANT
SHEATH PINNACLE 5F 10CM (SHEATH) ×2 IMPLANT
SHEATH PINNACLE 7F 10CM (SHEATH) ×2 IMPLANT
TRANSDUCER W/STOPCOCK (MISCELLANEOUS) ×3 IMPLANT
TUBING CIL FLEX 10 FLL-RA (TUBING) ×2 IMPLANT
WIRE EMERALD 3MM-J .035X150CM (WIRE) ×2 IMPLANT

## 2016-03-21 NOTE — Progress Notes (Signed)
71F Sheath removed from Rt Femoral Artery. 5 mins lates 1F Sheath removed from Rt Femoral Vein. Manual pressure held for 30 minutes. Hemostasis achieved. Rt groin site soft with no active bleeding or hematoma noted. Post procedure bleeding precautions reviewed with patient. Patient resting comfortably at this time. Transferring to short stay now.

## 2016-03-21 NOTE — Progress Notes (Signed)
DR. Tamala Julian in Room to speak to patient at this time regarding Chest Discomfort. 1 Spray of NTG ordered. Will continue to monitor patient.

## 2016-03-21 NOTE — Discharge Instructions (Signed)

## 2016-03-21 NOTE — Interval H&P Note (Signed)
Cath Lab Visit (complete for each Cath Lab visit)  Clinical Evaluation Leading to the Procedure:   ACS: No.  Non-ACS:    Anginal Classification: CCS II  Anti-ischemic medical therapy: Minimal Therapy (1 class of medications)  Non-Invasive Test Results: No non-invasive testing performed  Prior CABG: No previous CABG      History and Physical Interval Note:  03/21/2016 9:16 AM  [name] [name]  has presented today for surgery, with the diagnosis of cp, symptomatic bradycardia, pulmonary fibrosis  The various methods of treatment have been discussed with the patient and family. After consideration of risks, benefits and other options for treatment, the patient has consented to  Procedure(s): Right/Left Heart Cath and Coronary Angiography (N/A) as a surgical intervention .  The patient's history has been reviewed, patient examined, no change in status, stable for surgery.  I have reviewed the patient's chart and labs.  Questions were answered to the patient's satisfaction.     Belva Crome III

## 2016-03-21 NOTE — H&P (View-Only) (Signed)
Cardiology Office Note    Date:  03/17/2016   ID:  [name], DOB 04/09/1950, MRN [id number]  PCP:  No PCP Per Patient  Cardiologist:   Ena Dawley, MD   Chief complain: Dyspnea on exertion, bradycardia, dizziness, syncope.  History of Present Illness:  [name] [name] is a 66 y.o. female with very complicated medical history including nonobstructive CAD on cardiac cath over 10 years ago. She also history of HIV positivity being treated with anti-retroviral therapy with undetectable viral loads, history of idiopathic pulmonary fibrosis currently being evaluated for lung transplant at [facility]. H/O smoking, quit 23 years ago. The patient states that in the last year and predominantly in the last few months she has developed significant retrosternal chest tightness with exertion, it resolves at rest. She states that she is limited with activities of daily living and gets short of breath and chest tightness when walking short distances. She went to the ER twice during the last year and was noticed to have bradycardia down to 40s on no AVN blocking agents that she feels the dizzy 2 to 3 times a day while walking and one occasion she passed out while standing up. Denies LE edema, orthopnea, PND.   Past Medical History:  Diagnosis Date  . Asthma   . Bradycardia    Sinus bradycardia  . CAD (coronary artery disease)    Minimal by history, Question coronary artery spasm  . Colon polyps   . Dyslipidemia   . Ejection fraction   . GERD (gastroesophageal reflux disease)    Esophageal stricture  . H/O: hysterectomy   . HIV positive ([place])    Needle stick 1993  . Hyperlipidemia   . Palpitations   . Peptic ulcer disease   . Shortness of breath    Extensive evaluation Dr. Winona Legato, [facility], [place], [id number], etiology uncertain, probable bronchiolitis possibility of lung biopsy at that time  . Weight loss     Past Surgical History:  Procedure Laterality Date  . APPENDECTOMY    . CHOLECYSTECTOMY      . GASTRECTOMY    . VENTRAL HERNIA REPAIR     Current Medications: Outpatient Medications Prior to Visit  Medication Sig Dispense Refill  . albuterol (PROVENTIL HFA;VENTOLIN HFA) 108 (90 Base) MCG/ACT inhaler INHALE 2 PUFFS BY MOUTH AS DIRECTED AS NEEDED FOR SHORTNESS OF BREATH    . amitriptyline (ELAVIL) 50 MG tablet Take 50 mg by mouth at bedtime.      . budesonide-formoterol (SYMBICORT) 160-4.5 MCG/ACT inhaler Inhale 2 puffs into the lungs 2 (two) times daily as needed.     . clobetasol ointment (TEMOVATE) 0.05 % APPLY TO AFFECTED FINGER 1 TO 2 TIMES DAILY WITH AT LEAST 1 APPLICATION UNDER OCCLUSION    . conjugated estrogens (PREMARIN) vaginal cream Use 1/4 applicator nightly and then 3 times weekly thereafter    . docusate sodium (COLACE) 100 MG capsule Take 100 mg by mouth daily as needed.     Marland Kitchen emtricitabine-tenofovir (TRUVADA) 200-300 MG tablet TAKE 1 TABLET BY MOUTH EVERY DAY    . omeprazole (PRILOSEC) 40 MG capsule Take 40 mg by mouth every morning.     . Pirfenidone 267 MG CAPS Take 534 mg by mouth 2 (two) times daily.     . polyethylene glycol (MIRALAX / GLYCOLAX) packet Take 17 g by mouth daily as needed.     . pravastatin (PRAVACHOL) 80 MG tablet Take 1 tablet by mouth daily.    . raltegravir (ISENTRESS) 400  MG tablet TAKE 1 TABLET BY MOUTH TWICE DAILY    . senna-docusate (SENOKOT-S) 8.6-50 MG tablet Take 1 tablet by mouth daily as needed.     . sucralfate (CARAFATE) 1 GM/10ML suspension Take 1 g by mouth 2 (two) times daily.      No facility-administered medications prior to visit.      Allergies:   Sulfamethoxazole-trimethoprim   Social History   Social History  . Marital status: Widowed    Spouse name: N/A  . Number of children: 3  . Years of education: N/A   Occupational History  . full-time [employer]   Social History Main Topics  . Smoking status: Former Smoker    Quit date: 03/13/1996  . Smokeless tobacco: Never Used  . Alcohol  use No  . Drug use: No  . Sexual activity: Not on file   Other Topics Concern  . Not on file   Social History Narrative  . No narrative on file     Family History:  The patient's family history includes Bradycardia in her brother and sister; Diabetes in her mother; Heart attack in her mother; Heart disease in her mother; Hypertension in her mother; Stroke in her mother.   ROS:   Please see the history of present illness.    ROS All other systems reviewed and are negative.   PHYSICAL EXAM:   VS:  BP 124/60   Pulse 75   Ht 4\' 11"  (1.499 m)   Wt 130 lb (59 kg)   BMI 26.26 kg/m    GEN: Well nourished, well developed, in no acute distress  HEENT: normal  Neck: no JVD, carotid bruits, or masses Cardiac: RRR; no murmurs, rubs, or gallops,no edema  Respiratory:  Dry crackles at bases bilaterally, normal work of breathing GI: soft, nontender, nondistended, + BS MS: no deformity or atrophy  Skin: warm and dry, no rash Neuro:  Alert and Oriented x 3, Strength and sensation are intact Psych: euthymic mood, full affect  Wt Readings from Last 3 Encounters:  03/17/16 130 lb (59 kg)  03/16/16 129 lb 13.6 oz (58.9 kg)  03/14/16 129 lb 10.1 oz (58.8 kg)      Studies/Labs Reviewed:   EKG:  EKG is ordered today.  The ekg ordered today demonstrates Sinus rhythm, intra-infarct age undetermined, anterolateral infarct age undetermined.  Recent Labs: 08/28/2015: BUN 16; Creatinine, Ser 0.86; Hemoglobin 13.2; Platelets 260; Potassium 4.3; Sodium 140   Lipid Panel No results found for: CHOL, TRIG, HDL, CHOLHDL, VLDL, LDLCALC, LDLDIRECT  Additional studies/ records that were reviewed today include:  No echocardiogram since 2003.    ASSESSMENT:    1. Bradycardia   2. Dyslipidemia   3. Pulmonary interstitial fibrosis ([place])   4. Symptomatic bradycardia   5. Pre-procedure lab exam   6. Precordial pain   7. Dizziness      PLAN:  In order of problems listed above:  1. This  patient has a typical exertional chest tightness and new EKG changes with suspicion for inferior and anterolateral infarct were not present in 2014. She is also being worked up for lung transplant.. Therefore we will schedule left and right-sided cath. We will start aspirin 81 mg daily. 2. Symptomatic bradycardia - with an episode of syncope earlier this year and several episodes of dizziness every day, no AV nodal blocking agent, we will start 48 hour Holter monitor.  3.  Idiopathic pulmonary fibrosis workup for lung transplant, we'll order echocardiogram to evaluate left  and right-sided ventricular function. 4. HLP also notes her P right now we will recheck her lipids.  5. HIV - Patient is HIV positive from a needle stick in 1993. Fortunately she is on appropriate medications and doing well.  Follow up in 2 months.   Medication Adjustments/Labs and Tests Ordered: Current medicines are reviewed at length with the patient today.  Concerns regarding medicines are outlined above.  Medication changes, Labs and Tests ordered today are listed in the Patient Instructions below. Patient Instructions  Medication Instructions:   START TAKING ASPIRIN 81 MG ONCE DAILY   Labwork:  TODAY--PRE-CATH LABS---BMET, CBC W DIFF, AND PT/INR   Testing/Procedures:  Your physician has requested that you have an echocardiogram. Echocardiography is a painless test that uses sound waves to create images of your heart. It provides your doctor with information about the size and shape of your heart and how well your heart's chambers and valves are working. This procedure takes approximately one hour. There are no restrictions for this procedure.   Your physician has recommended that you wear a 48 HOUR holter monitor. Holter monitors are medical devices that record the heart's electrical activity. Doctors most often use these monitors to diagnose arrhythmias. Arrhythmias are problems with the speed or rhythm of the  heartbeat. The monitor is a small, portable device. You can wear one while you do your normal daily activities. This is usually used to diagnose what is causing palpitations/syncope (passing out).   Your physician has requested that you have a cardiac catheterization. Cardiac catheterization is used to diagnose and/or treat various heart conditions. Doctors may recommend this procedure for a number of different reasons. The most common reason is to evaluate chest pain. Chest pain can be a symptom of coronary artery disease (CAD), and cardiac catheterization can show whether plaque is narrowing or blocking your heart's arteries. This procedure is also used to evaluate the valves, as well as measure the blood flow and oxygen levels in different parts of your heart. For further information please visit HugeFiesta.tn. Please follow instruction sheet, as given.  YOUR CATH IS SCHEDULED FOR NEXT Tuesday 03/21/16 AT 7 AM FOR DR SMITH TO DO.  PLEASE ARRIVE AT [place] AT 7 AM.  PLEASE FOLLOW INSTRUCTION LETTER PROVIDED.      Follow-Up:  AT DR Carnisha Feltz'S NEXT AVAILABLE APPOINTMENT     If you need a refill on your cardiac medications before your next appointment, please call your pharmacy.      Signed, Ena Dawley, MD  03/17/2016 11:34 AM    [address], [place], [place]  [address] Phone: [phone]; Fax: [phone]

## 2016-03-21 NOTE — Progress Notes (Signed)
Report and Care received from [facility] Patient resting in bed at this time. 1F Sheath to Rt Femoral artery and 35F sheath to Rt Femoral Vein. Rt groin soft with no active bleeding or hematoma noted. VSS. Patient states 3/10 Chest discomfort. EKG being done at this time for Doctor to review. Will continue to monitor patient.

## 2016-03-22 ENCOUNTER — Encounter (HOSPITAL_COMMUNITY): Payer: Self-pay | Admitting: Interventional Cardiology

## 2016-03-23 ENCOUNTER — Encounter (HOSPITAL_COMMUNITY): Admission: RE | Admit: 2016-03-23 | Payer: Medicare HMO | Source: Ambulatory Visit

## 2016-03-24 ENCOUNTER — Encounter: Payer: Self-pay | Admitting: *Deleted

## 2016-03-24 ENCOUNTER — Telehealth: Payer: Self-pay | Admitting: Cardiology

## 2016-03-24 NOTE — Telephone Encounter (Signed)
Follow up   Pt verbalized that she is calling for rn to see if her letter for work has been completed

## 2016-03-24 NOTE — Telephone Encounter (Signed)
New message      Pt had a cath on Tuesday.  She want to come by this am and get a note to return to work on 03-28-16.  Pt states that she lives 1hr away and will be in the area in about 1 hr and would love to stop by and get note.

## 2016-03-24 NOTE — Telephone Encounter (Signed)
To Whom It May Concern,  [name] [name] is currently under my care and can return to full time work starting 03/28/2016.  Please call us with any questions.  Sincerely,  Ena Dawley, MD [facility]

## 2016-03-24 NOTE — Telephone Encounter (Signed)
Pt is calling back for the second time today to request for a return to work note written by Dr Meda Coffee, for Tuesday 03/28/16.  Pt states she told her job she would be back by then, but they will need a work note from her Cardiologist before she can return.  Informed the pt that Dr Meda Coffee is in the Hospital today, but once she reviews this and advises, I will follow-up with her shortly thereafter. Pt verbalized understanding and agrees with this plan.

## 2016-03-24 NOTE — Telephone Encounter (Signed)
Spoke with the pt and she is calling to request a return to work note for 03/28/16, post cath on this past Tuesday 1/9.  Pt states that the discharge RN at the hospital was to provide her a return to work note, post cath.   Pt states the discharge RN forgot to do this.  Pt would like for Dr Meda Coffee to advise on this and write for her to return to work on 03/28/16.  Informed the pt that Dr Meda Coffee is out of the office today, but I will forward this message to her for further review and recommendation, then follow-up with her shortly thereafter.  Pt verbalized understanding and agrees with this plan.

## 2016-03-24 NOTE — Telephone Encounter (Signed)
Pts return to work letter written and informed her this is available at the front desk to pick up at her earliest convenience.  Pt verbalized understanding and agrees with this plan.  Pt will pick this up on Monday 03/27/16.  Pt gracious for all the assistance provided.

## 2016-03-28 ENCOUNTER — Encounter (HOSPITAL_COMMUNITY)
Admission: RE | Admit: 2016-03-28 | Discharge: 2016-03-28 | Disposition: A | Discharge status: Home / Self Care | Payer: Medicare HMO | Source: Ambulatory Visit | Attending: Pulmonary Disease | Admitting: Pulmonary Disease

## 2016-03-28 VITALS — Wt 129.4 lb

## 2016-03-28 DIAGNOSIS — J8410 Pulmonary fibrosis, unspecified: Secondary | ICD-10-CM | POA: Diagnosis not present

## 2016-03-28 NOTE — Progress Notes (Signed)
Daily Session Note  Patient Details  Name: [name] [name] MRN: [id number] Date of Birth: 02-Apr-1950 Referring Provider:   April Manson Pulmonary Rehab Walk Test from 02/15/2016 in [place]  Referring Provider  Dr. Nelda Marseille      Encounter Date: 03/28/2016  Check In:     Session Check In - 03/28/16 1339      Check-In   Location [facility]   Staff Present Rosebud Poles, RN, BSN;Molly diVincenzo, MS, ACSM RCEP, Exercise Physiologist;Lisa Ysidro Evert, RN;Samiha Denapoli Rollene Rotunda, RN, BSN   Supervising physician immediately available to respond to emergencies [facility] immediately available   Physician(s) Dr. Eliseo Squires   Medication changes reported     No   Fall or balance concerns reported    No   Warm-up and Cool-down Performed as group-led instruction   Resistance Training Performed Yes   VAD Patient? No     Pain Assessment   Currently in Pain? No/denies   Multiple Pain Sites No      Capillary Blood Glucose: No results found for this or any previous visit (from the past 24 hour(s)).      Exercise Prescription Changes - 03/28/16 1531      Response to Exercise   Blood Pressure (Admit) 102/60   Blood Pressure (Exercise) 100/62   Blood Pressure (Exit) 98/60   Heart Rate (Admit) 81 bpm   Heart Rate (Exercise) 98 bpm   Heart Rate (Exit) 88 bpm   Oxygen Saturation (Admit) 96 %   Oxygen Saturation (Exercise) 94 %   Oxygen Saturation (Exit) 97 %   Rating of Perceived Exertion (Exercise) 15   Perceived Dyspnea (Exercise) 3   Duration Progress to 45 minutes of aerobic exercise without signs/symptoms of physical distress   Intensity THRR unchanged     Progression   Progression Continue to progress workloads to maintain intensity without signs/symptoms of physical distress.     Resistance Training   Training Prescription Yes   Weight orange bands   Reps 10-12  10 minutes of strength training     Interval Training   Interval Training  No     NuStep   Level 4   Minutes 17   METs 1.7     Rower   Level 2   Minutes 17     Track   Laps 14   Minutes 17     Goals Met:  Using PLB without cueing & demonstrates good technique Exercise tolerated well No report of cardiac concerns or symptoms Strength training completed today  Goals Unmet:  Not Applicable  Comments: Service time is from 1330 to 1500   Dr. Rush Farmer is Medical Director for Pulmonary Rehab at [facility].

## 2016-03-30 ENCOUNTER — Encounter (HOSPITAL_COMMUNITY): Admission: RE | Admit: 2016-03-30 | Payer: Medicare HMO | Source: Ambulatory Visit

## 2016-04-04 ENCOUNTER — Encounter (HOSPITAL_COMMUNITY)
Admission: RE | Admit: 2016-04-04 | Discharge: 2016-04-04 | Disposition: A | Discharge status: Home / Self Care | Payer: Medicare HMO | Source: Ambulatory Visit | Attending: Pulmonary Disease | Admitting: Pulmonary Disease

## 2016-04-04 VITALS — Wt 130.3 lb

## 2016-04-04 DIAGNOSIS — J8410 Pulmonary fibrosis, unspecified: Secondary | ICD-10-CM | POA: Diagnosis not present

## 2016-04-04 NOTE — Progress Notes (Signed)
Daily Session Note  Patient Details  Name: [name] [name] MRN: [id number] Date of Birth: Aug 19, 1950 Referring Provider:   April Manson Pulmonary Rehab Walk Test from 02/15/2016 in [place]  Referring Provider  Dr. Nelda Marseille      Encounter Date: 04/04/2016  Check In:     Session Check In - 04/04/16 1522      Check-In   Location [facility]   Staff Present Rosebud Poles, RN, BSN;Molly diVincenzo, MS, ACSM RCEP, Exercise Physiologist;Lisa Ysidro Evert, RN;Portia Rollene Rotunda, RN, BSN   Supervising physician immediately available to respond to emergencies [facility] immediately available   Physician(s) Dr. Wyline Copas   Medication changes reported     No   Fall or balance concerns reported    No   Warm-up and Cool-down Performed as group-led instruction   Resistance Training Performed Yes   VAD Patient? No     Pain Assessment   Currently in Pain? No/denies   Multiple Pain Sites No      Capillary Blood Glucose: No results found for this or any previous visit (from the past 24 hour(s)).      Exercise Prescription Changes - 04/04/16 1600      Response to Exercise   Blood Pressure (Admit) 102/66   Blood Pressure (Exercise) 110/50   Blood Pressure (Exit) 100/56   Heart Rate (Admit) 92 bpm   Heart Rate (Exercise) 102 bpm   Heart Rate (Exit) 75 bpm   Oxygen Saturation (Admit) 95 %   Oxygen Saturation (Exercise) 93 %   Oxygen Saturation (Exit) 96 %   Rating of Perceived Exertion (Exercise) 17   Perceived Dyspnea (Exercise) 3   Duration Progress to 45 minutes of aerobic exercise without signs/symptoms of physical distress   Intensity THRR unchanged     Progression   Progression Continue to progress workloads to maintain intensity without signs/symptoms of physical distress.     Resistance Training   Training Prescription Yes   Weight orange bands   Reps 10-12  10 minutes of strength training     Interval Training   Interval Training  No     NuStep   Level 4   Minutes 17     Rower   Level 2   Minutes 17     Track   Laps 15   Minutes 17     Goals Met:  Exercise tolerated well Strength training completed today  Goals Unmet:  Not Applicable  Comments: Service time is from 1330 to 1505    Dr. Rush Farmer is Medical Director for Pulmonary Rehab at [facility].

## 2016-04-04 NOTE — Progress Notes (Signed)
Pulmonary Individual Treatment Plan  Patient Details  Name: [name] [name] MRN: [id number] Date of Birth: 07/18/1950 Referring Provider:   Flowsheet Row Pulmonary Rehab Walk Test from 02/15/2016 in [facility]  Referring Provider  Dr. Yacoub      Initial Encounter Date:  Flowsheet Row Pulmonary Rehab Walk Test from 02/15/2016 in [facility]  Date  02/15/16  Referring Provider  Dr. Yacoub      Visit Diagnosis: Pulmonary fibrosis (HCC)  Patient's Home Medications on Admission:   Current Outpatient Prescriptions:  .  albuterol (PROVENTIL HFA;VENTOLIN HFA) 108 (90 Base) MCG/ACT inhaler, INHALE 2 PUFFS BY MOUTH AS DIRECTED AS NEEDED FOR SHORTNESS OF BREATH, Disp: , Rfl:  .  amitriptyline (ELAVIL) 50 MG tablet, Take 50 mg by mouth at bedtime.  , Disp: , Rfl:  .  aspirin EC 81 MG tablet, Take 1 tablet (81 mg total) by mouth daily., Disp: 90 tablet, Rfl: 3 .  budesonide-formoterol (SYMBICORT) 160-4.5 MCG/ACT inhaler, Inhale 2 puffs into the lungs 2 (two) times daily as needed. , Disp: , Rfl:  .  clobetasol cream (TEMOVATE) 0.05 %, Apply 1 application topically 2 (two) times daily as needed. APPLY TO AFFECTED FINGER 1 TO 2 TIMES D WITH AT LEAST 1 APPLICATION UNDER OCCLUSION, Disp: , Rfl:  .  conjugated estrogens (PREMARIN) vaginal cream, Place 0.25 Applicatorfuls vaginally 3 (three) times a week. Pt only to use 1/4 of the applicator, Disp: , Rfl:  .  docusate sodium (COLACE) 100 MG capsule, Take 100 mg by mouth daily as needed for mild constipation. , Disp: , Rfl:  .  emtricitabine-tenofovir (TRUVADA) 200-300 MG tablet, TAKE 1 TABLET BY MOUTH EVERY DAY, Disp: , Rfl:  .  isosorbide mononitrate (IMDUR) 30 MG 24 hr tablet, Take 1 tablet (30 mg total) by mouth daily., Disp: 30 tablet, Rfl: 12 .  nitroGLYCERIN (NITROSTAT) 0.4 MG SL tablet, Place 1 tablet (0.4 mg total) under the tongue every 5 (five) minutes as needed for chest pain., Disp:  30 tablet, Rfl: 12 .  omeprazole (PRILOSEC) 40 MG capsule, Take 40 mg by mouth every morning. , Disp: , Rfl:  .  Pirfenidone (ESBRIET) 267 MG CAPS, Take 2 capsules by mouth 2 (two) times daily., Disp: , Rfl:  .  polyethylene glycol (MIRALAX / GLYCOLAX) packet, Take 17 g by mouth daily as needed. , Disp: , Rfl:  .  pravastatin (PRAVACHOL) 80 MG tablet, Take 1 tablet by mouth daily., Disp: , Rfl:  .  raltegravir (ISENTRESS) 400 MG tablet, TAKE 1 TABLET BY MOUTH TWICE DAILY, Disp: , Rfl:  .  senna-docusate (SENOKOT-S) 8.6-50 MG tablet, Take 1 tablet by mouth daily as needed for mild constipation. , Disp: , Rfl:  .  sucralfate (CARAFATE) 1 g tablet, Take 1 g by mouth 2 (two) times daily. , Disp: , Rfl:   Past Medical History: Past Medical History:  Diagnosis Date  . Asthma   . Bradycardia    Sinus bradycardia  . CAD (coronary artery disease)    Minimal by history, Question coronary artery spasm  . Colon polyps   . Dyslipidemia   . Ejection fraction   . GERD (gastroesophageal reflux disease)    Esophageal stricture  . H/O: hysterectomy   . HIV positive (HCC)    Needle stick 1993  . Hyperlipidemia   . Palpitations   . Peptic ulcer disease   . Shortness of breath    Extensive evaluation Dr. Peter Morris,   [place], May, 3976, etiology uncertain, probable bronchiolitis possibility of lung biopsy at that time  . Weight loss     Tobacco Use: History  Smoking Status  . Former Smoker  . Quit date: 03/13/1996  Smokeless Tobacco  . Never Used    Labs: Recent Review Flowsheet Data    Labs for ITP Cardiac and Pulmonary Rehab Latest Ref Rng & Units 03/21/2016 03/21/2016   PHART 7.350 - 7.450 - 7.336(L)   PCO2ART 32.0 - 48.0 mmHg - 36.5   HCO3 20.0 - 28.0 mmol/L 19.1(L) 19.5(L)   TCO2 0 - 100 mmol/L 20 21   ACIDBASEDEF 0.0 - 2.0 mmol/L 7.0(H) 6.0(H)   O2SAT % 73.0 99.0      Capillary Blood Glucose: Lab Results  Component Value Date   GLUCAP 87 04/10/2014     ADL UCSD:     Pulmonary  Assessment Scores    Row Name 02/17/16 1400         ADL UCSD   ADL Phase Entry     SOB Score total 73        Pulmonary Function Assessment:     Pulmonary Function Assessment - 02/07/16 1450      Breath   Bilateral Breath Sounds Other  fine crackles mid to lower lobes bilat.    Shortness of Breath Yes;Limiting activity      Exercise Target Goals:    Exercise Program Goal: Individual exercise prescription set with THRR, safety & activity barriers. Participant demonstrates ability to understand and report RPE using BORG scale, to self-measure pulse accurately, and to acknowledge the importance of the exercise prescription.  Exercise Prescription Goal: Starting with aerobic activity 30 plus minutes a day, 3 days per week for initial exercise prescription. Provide home exercise prescription and guidelines that participant acknowledges understanding prior to discharge.  Activity Barriers & Risk Stratification:     Activity Barriers & Cardiac Risk Stratification - 02/07/16 1448      Activity Barriers & Cardiac Risk Stratification   Activity Barriers Shortness of Breath      6 Minute Walk:     6 Minute Walk    Row Name 02/15/16 1609         6 Minute Walk   Phase Initial     Distance 1224 feet     Walk Time 6 minutes     # of Rest Breaks 0     MPH 2.31     METS 2.76     RPE 13     Perceived Dyspnea  3     Symptoms No     Resting HR 67 bpm     Resting BP 112/70     Max Ex. HR 85 bpm     Max Ex. BP 118/68       Interval HR   Baseline HR 67     1 Minute HR 81     2 Minute HR 80     3 Minute HR 85     4 Minute HR 84     5 Minute HR 86     6 Minute HR 85     2 Minute Post HR 80     Interval Heart Rate? Yes       Interval Oxygen   Interval Oxygen? Yes     Baseline Oxygen Saturation % 99 %     Baseline Liters of Oxygen 0 L     1 Minute Oxygen Saturation % 99 %  1 Minute Liters of Oxygen 0 L     2 Minute Oxygen Saturation % 90 %     2 Minute Liters  of Oxygen 0 L     3 Minute Oxygen Saturation % 94 %     3 Minute Liters of Oxygen 0 L     4 Minute Oxygen Saturation % 93 %     4 Minute Liters of Oxygen 0 L     5 Minute Oxygen Saturation % 99 %     5 Minute Liters of Oxygen 0 L     6 Minute Oxygen Saturation % 93 %     6 Minute Liters of Oxygen 0 L     2 Minute Post Oxygen Saturation % 100 %     2 Minute Post Liters of Oxygen 0 L        Initial Exercise Prescription:     Initial Exercise Prescription - 02/15/16 1600      Date of Initial Exercise RX and Referring Provider   Date 02/15/16   Referring Provider Dr. Yacoub     NuStep   Level 1   Minutes 17   METs 1.5     Rower   Level 1   Minutes 17     Track   Laps 5   Minutes 17     Prescription Details   Frequency (times per week) 2   Duration Progress to 45 minutes of aerobic exercise without signs/symptoms of physical distress     Intensity   THRR 40-80% of Max Heartrate 62-125   Ratings of Perceived Exertion 11-13   Perceived Dyspnea 0-4     Progression   Progression Continue progressive overload as per policy without signs/symptoms or physical distress.     Resistance Training   Training Prescription Yes   Weight orange bands   Reps 10-12      Perform Capillary Blood Glucose checks as needed.  Exercise Prescription Changes:     Exercise Prescription Changes    Row Name 02/22/16 1500 02/24/16 1500 02/29/16 1600 03/02/16 1500 03/07/16 1500     Exercise Review   Progression  -  - Yes  -  -     Response to Exercise   Blood Pressure (Admit) 110/62 112/60 96/58 128/70 108/62   Blood Pressure (Exercise) 104/60 130/64 112/80 96/62 98/58   Blood Pressure (Exit) 98/60 108/82 98/60 94/60 92/54   Heart Rate (Admit) 77 bpm 76 bpm 79 bpm 82 bpm 67 bpm   Heart Rate (Exercise) 96 bpm 92 bpm 102 bpm 81 bpm 87 bpm   Heart Rate (Exit) 68 bpm 68 bpm 74 bpm 66 bpm 71 bpm   Oxygen Saturation (Admit) 95 % 99 % 99 % 98 % 99 %   Oxygen Saturation (Exercise) 96 %  98 % 95 % 98 % 98 %   Oxygen Saturation (Exit) 93 % 92 % 98 % 97 % 98 %   Rating of Perceived Exertion (Exercise) 15 13 16 17 15   Perceived Dyspnea (Exercise) 2 3 3 3 2   Duration Progress to 45 minutes of aerobic exercise without signs/symptoms of physical distress Progress to 45 minutes of aerobic exercise without signs/symptoms of physical distress Progress to 45 minutes of aerobic exercise without signs/symptoms of physical distress Progress to 45 minutes of aerobic exercise without signs/symptoms of physical distress Progress to 45 minutes of aerobic exercise without signs/symptoms of physical distress   Intensity -  40-80% HRR -  40-80% HRR -    40-80% HRR -  40-80% HRR THRR unchanged     Progression   Progression Continue to progress workloads to maintain intensity without signs/symptoms of physical distress. Continue to progress workloads to maintain intensity without signs/symptoms of physical distress. Continue to progress workloads to maintain intensity without signs/symptoms of physical distress. Continue to progress workloads to maintain intensity without signs/symptoms of physical distress. Continue to progress workloads to maintain intensity without signs/symptoms of physical distress.     Resistance Training   Training Prescription _0    Weight _1    Reps 10-12  10 minutes of strength training 10-12  10 minutes of strength training 10-12  10 minutes of strength training 10-12  10 minutes of strength training 10-12  10 minutes of strength training     Interval Training   Interval Training _2      NuStep   Level 1  - _3 Minutes 17  - _4 METs 2.2  - 1.9 2.2 1.9     Rower   Level _5 Minutes _6 Track   Laps _7 - 14   Minutes _8 - 17   Row Name 03/14/16 1500 03/16/16 1500 03/28/16 1531         Response to Exercise   Blood  Pressure (Admit) 110/68 112/60 102/60     Blood Pressure (Exercise) 100/60 96/64 100/62     Blood Pressure (Exit) 96/60 100/64 98/60     Heart Rate (Admit) 76 bpm 68 bpm 81 bpm     Heart Rate (Exercise) 94 bpm 84 bpm 98 bpm     Heart Rate (Exit) 72 bpm 78 bpm 88 bpm     Oxygen Saturation (Admit) 98 % 100 % 96 %     Oxygen Saturation (Exercise) 98 % 97 % 94 %     Oxygen Saturation (Exit) 95 % 98 % 97 %     Rating of Perceived Exertion (Exercise) _9 Perceived Dyspnea (Exercise) _10 Duration Progress to 45 minutes of aerobic exercise without signs/symptoms of physical distress Progress to 45 minutes of aerobic exercise without signs/symptoms of physical distress Progress to 45 minutes of aerobic exercise without signs/symptoms of physical distress     Intensity THRR unchanged THRR unchanged THRR unchanged       Progression   Progression Continue to progress workloads to maintain intensity without signs/symptoms of physical distress. Continue to progress workloads to maintain intensity without signs/symptoms of physical distress. Continue to progress workloads to maintain intensity without signs/symptoms of physical distress.       Resistance Training   Training Prescription Yes Yes Yes     Weight orange bands orange bands orange bands     Reps 10-12  10 minutes of strength training 10-12  10 minutes of strength training 10-12  10 minutes of strength training       Interval Training   Interval Training No No No       NuStep   Level 2  - 4     Minutes 17  - 17     METs 1.8  - 1.7       Rower   Level _11 Minutes 77 93  [place] _0 Minutes _1 Exercise Comments:     Exercise Comments    Row Name 03/02/16 1002 04/04/16 0826         Exercise Comments Patient has only attended three exercise sessions. Will cont. to monitor.  Patient walkes up to 16 laps in 15 minutes. MET level places her at a low level. Rates her level  of exertion at a low level. Will cont. to monitor and progress.          Discharge Exercise Prescription (Final Exercise Prescription Changes):     Exercise Prescription Changes - 03/28/16 1531      Response to Exercise   Blood Pressure (Admit) 102/60   Blood Pressure (Exercise) 100/62   Blood Pressure (Exit) 98/60   Heart Rate (Admit) 81 bpm   Heart Rate (Exercise) 98 bpm   Heart Rate (Exit) 88 bpm   Oxygen Saturation (Admit) 96 %   Oxygen Saturation (Exercise) 94 %   Oxygen Saturation (Exit) 97 %   Rating of Perceived Exertion (Exercise) 15   Perceived Dyspnea (Exercise) 3   Duration Progress to 45 minutes of aerobic exercise without signs/symptoms of physical distress   Intensity THRR unchanged     Progression   Progression Continue to progress workloads to maintain intensity without signs/symptoms of physical distress.     Resistance Training   Training Prescription Yes   Weight orange bands   Reps 10-12  10 minutes of strength training     Interval Training   Interval Training No     NuStep   Level 4   Minutes 17   METs 1.7     Rower   Level 2   Minutes 17     Track   Laps 14   Minutes 17       Nutrition:  Target Goals: Understanding of nutrition guidelines, daily intake of sodium <1539m, cholesterol <2049m calories 30% from fat and 7% or less from saturated fats, daily to have 5 or more servings of fruits and vegetables.  Biometrics:     Pre Biometrics - 02/07/16 1505      Pre Biometrics   Grip Strength 20 kg       Nutrition Therapy Plan and Nutrition Goals:   Nutrition Discharge: Rate Your Plate Scores:   Psychosocial: Target Goals: Acknowledge presence or absence of depression, maximize coping skills, provide positive support system. Participant is able to verbalize types and ability to use techniques and skills needed for reducing stress and depression.  Initial Review & Psychosocial Screening:     Initial Psych Review &  Screening - 02/07/16 [place]     Barriers   Psychosocial barriers to participate in program There are no identifiable barriers or psychosocial needs.;The patient should benefit from training in stress management and relaxation.     Screening Interventions   Interventions Encouraged to exercise      Quality of Life Scores:     Quality of Life - 02/17/16 1401      Quality of Life Scores   Health/Function Pre 16.8 %   Socioeconomic Pre 23.11 %   Psych/Spiritual Pre 22.5 %   Family Pre 16.5 %   GLOBAL Pre 19.62 %      PHQ-9: Recent Review Flowsheet Data    Depression  screen PHQ 2/9 02/07/2016   Decreased Interest 1   Down, Depressed, Hopeless 0   PHQ - 2 Score 1      Psychosocial Evaluation and Intervention:   Psychosocial Re-Evaluation:     Psychosocial Re-Evaluation    Row Name 03/02/16 1111 04/03/16 1441           Psychosocial Re-Evaluation   Interventions Encouraged to attend Pulmonary Rehabilitation for the exercise Encouraged to attend Pulmonary Rehabilitation for the exercise      Comments No psychosocial issues identified No psychosocial issues identified      Continued Psychosocial Services Needed No No        Education: Education Goals: Education classes will be provided on a weekly basis, covering required topics. Participant will state understanding/return demonstration of topics presented.  Learning Barriers/Preferences:     Learning Barriers/Preferences - 02/07/16 1450      Learning Barriers/Preferences   Learning Barriers None   Learning Preferences Group Instruction;Written Material;Individual Instruction      Education Topics: Risk Factor Reduction:  -Group instruction that is supported by a PowerPoint presentation. Instructor discusses the definition of a risk factor, different risk factors for pulmonary disease, and how the heart and lungs work together.     Nutrition for Pulmonary  Patient:  -Group instruction provided by PowerPoint slides, verbal discussion, and written materials to support subject matter. The instructor gives an explanation and review of healthy diet recommendations, which includes a discussion on weight management, recommendations for fruit and vegetable consumption, as well as protein, fluid, caffeine, fiber, sodium, sugar, and alcohol. Tips for eating when patients are short of breath are discussed.   Pursed Lip Breathing:  -Group instruction that is supported by demonstration and informational handouts. Instructor discusses the benefits of pursed lip and diaphragmatic breathing and detailed demonstration on how to preform both.     Oxygen Safety:  -Group instruction provided by PowerPoint, verbal discussion, and written material to support subject matter. There is an overview of "What is Oxygen" and "Why do we need it".  Instructor also reviews how to create a safe environment for oxygen use, the importance of using oxygen as prescribed, and the risks of noncompliance. There is a brief discussion on traveling with oxygen and resources the patient may utilize.   Oxygen Equipment:  -Group instruction provided by [facility] Staff utilizing handouts, written materials, and equipment demonstrations. Flowsheet Row PULMONARY REHAB OTHER RESPIRATORY from 03/16/2016 in [place]  Date  03/16/16  Educator  [employer]  Instruction Review Code  2- meets goals/outcomes      Signs and Symptoms:  -Group instruction provided by written material and verbal discussion to support subject matter. Warning signs and symptoms of infection, stroke, and heart attack are reviewed and when to call the physician/911 reinforced. Tips for preventing the spread of infection discussed.   Advanced Directives:  -Group instruction provided by verbal instruction and written material to support subject matter. Instructor reviews Advanced Directive laws  and proper instruction for filling out document.   Pulmonary Video:  -Group video education that reviews the importance of medication and oxygen compliance, exercise, good nutrition, pulmonary hygiene, and pursed lip and diaphragmatic breathing for the pulmonary patient.   Exercise for the Pulmonary Patient:  -Group instruction that is supported by a PowerPoint presentation. Instructor discusses benefits of exercise, core components of exercise, frequency, duration, and intensity of an exercise routine, importance of utilizing pulse oximetry during exercise, safety while exercising, and options of places  to exercise outside of rehab.     Pulmonary Medications:  -Verbally interactive group education provided by instructor with focus on inhaled medications and proper administration.   Anatomy and Physiology of the Respiratory System and Intimacy:  -Group instruction provided by PowerPoint, verbal discussion, and written material to support subject matter. Instructor reviews respiratory cycle and anatomical components of the respiratory system and their functions. Instructor also reviews differences in obstructive and restrictive respiratory diseases with examples of each. Intimacy, Sex, and Sexuality differences are reviewed with a discussion on how relationships can change when diagnosed with pulmonary disease. Common sexual concerns are reviewed. Flowsheet Row PULMONARY REHAB OTHER RESPIRATORY [facility]16[facility]2016 [facility]  Date  02/24/16  Educator  rn  Instruction Review Code  2- meets goals/outcomes      Knowledge Questionnaire Score:     Knowledge Questionnaire Score - 02/17/16 1400      Knowledge Questionnaire Score   Pre Score 12/13      Core Components/Risk Factors/Patient Goals at Admission:     Personal Goals and Risk Factors at Admission - 02/07/16 1456      Core Components/Risk Factors/Patient Goals on Admission   Improve shortness of  breath with ADL's Yes   Intervention Provide education, individualized exercise plan and daily activity instruction to help decrease symptoms of SOB with activities of daily living.   Expected Outcomes Short Term: Achieves a reduction of symptoms when performing activities of daily living.   Develop more efficient breathing techniques such as purse lipped breathing and diaphragmatic breathing; and practicing self-pacing with activity Yes   Intervention Provide education, demonstration and support about specific breathing techniuqes utilized for more efficient breathing. Include techniques such as pursed lipped breathing, diaphragmatic breathing and self-pacing activity.   Expected Outcomes Short Term: Participant will be able to demonstrate and use breathing techniques as needed throughout daily activities.      Core Components/Risk Factors/Patient Goals Review:      Goals and Risk Factor Review    Row Name 03/02/16 1109 04/03/16 1439           Core Components/Risk Factors/Patient Goals Review   Personal Goals Review Develop more efficient breathing techniques such as purse lipped breathing and diaphragmatic breathing and practicing self-pacing with activity.;Improve shortness of breath with ADL's Develop more efficient breathing techniques such as purse lipped breathing and diaphragmatic breathing and practicing self-pacing with activity.;Improve shortness of breath with ADL's      Review has attended 3 exercise sessions, too early to see improvement absent for 1 week d/t cardiac catheterization with minimal CAD results, slowly progressing      Expected Outcomes expect to see progress in the next 30 days will continue to gain strength and stamina as workloads increase         Core Components/Risk Factors/Patient Goals at Discharge (Final Review):      Goals and Risk Factor Review - 04/03/16 1439      Core Components/Risk Factors/Patient Goals Review   Personal Goals Review Develop  more efficient breathing techniques such as purse lipped breathing and diaphragmatic breathing and practicing self-pacing with activity.;Improve shortness of breath with ADL's   Review absent for 1 week d/t cardiac catheterization with minimal CAD results, slowly progressing   Expected Outcomes will continue to gain strength and stamina as workloads increase      ITP Comments:   Comments: ITP REVIEW Pt is making expected progress toward pulmonary rehab goals after completing 8 sessions. Recommend continued   exercise, life style modification, education, and utilization of breathing techniques to increase stamina and strength and decrease shortness of breath with exertion.

## 2016-04-05 ENCOUNTER — Other Ambulatory Visit: Payer: Self-pay | Admitting: Cardiology

## 2016-04-05 DIAGNOSIS — R001 Bradycardia, unspecified: Secondary | ICD-10-CM

## 2016-04-05 DIAGNOSIS — J8410 Pulmonary fibrosis, unspecified: Secondary | ICD-10-CM

## 2016-04-05 DIAGNOSIS — Z01812 Encounter for preprocedural laboratory examination: Secondary | ICD-10-CM

## 2016-04-05 DIAGNOSIS — R072 Precordial pain: Secondary | ICD-10-CM

## 2016-04-05 DIAGNOSIS — R42 Dizziness and giddiness: Secondary | ICD-10-CM

## 2016-04-06 ENCOUNTER — Ambulatory Visit (HOSPITAL_COMMUNITY): Payer: Self-pay

## 2016-04-06 ENCOUNTER — Ambulatory Visit: Payer: Self-pay

## 2016-04-06 ENCOUNTER — Encounter: Payer: Self-pay | Admitting: *Deleted

## 2016-04-06 ENCOUNTER — Encounter (HOSPITAL_COMMUNITY)
Admission: RE | Admit: 2016-04-06 | Discharge: 2016-04-06 | Disposition: A | Discharge status: Home / Self Care | Payer: Medicare HMO | Source: Ambulatory Visit | Attending: Pulmonary Disease | Admitting: Pulmonary Disease

## 2016-04-06 VITALS — Wt 130.1 lb

## 2016-04-06 DIAGNOSIS — J8410 Pulmonary fibrosis, unspecified: Secondary | ICD-10-CM | POA: Diagnosis not present

## 2016-04-06 NOTE — Progress Notes (Signed)
Patient ID: [name] [name], female   DOB: 1951/02/19, 66 y.o.   MRN: [id number]  Patient did not show up for 04/06/16, 11:30 AM, appointment to have a 48 hour holter monitor applied.

## 2016-04-06 NOTE — Progress Notes (Signed)
Daily Session Note  Patient Details  Name: [name] [name] MRN: [id number] Date of Birth: Apr 11, 1950 Referring Provider:   April Manson Pulmonary Rehab Walk Test from 02/15/2016 in [place]  Referring Provider  Dr. Nelda Marseille      Encounter Date: 04/06/2016  Check In:     Session Check In - 04/06/16 1342      Check-In   Location [facility]   Staff Present Su Hilt, MS, ACSM RCEP, Exercise Physiologist;Adaiah Morken Ysidro Evert, RN;Portia Rollene Rotunda, RN, BSN   Supervising physician immediately available to respond to emergencies [facility] immediately available   Physician(s) Dr. Algis Liming   Medication changes reported     No   Fall or balance concerns reported    No   Warm-up and Cool-down Performed as group-led instruction   Resistance Training Performed Yes   VAD Patient? No     Pain Assessment   Currently in Pain? No/denies   Multiple Pain Sites No      Capillary Blood Glucose: No results found for this or any previous visit (from the past 24 hour(s)).      Exercise Prescription Changes - 04/06/16 1600      Response to Exercise   Blood Pressure (Admit) 112/64   Blood Pressure (Exercise) 100/58   Blood Pressure (Exit) 104/64   Heart Rate (Admit) 72 bpm   Heart Rate (Exercise) 79 bpm   Heart Rate (Exit) 70 bpm   Oxygen Saturation (Admit) 96 %   Oxygen Saturation (Exercise) 96 %   Oxygen Saturation (Exit) 98 %   Rating of Perceived Exertion (Exercise) 13   Perceived Dyspnea (Exercise) 3   Duration Progress to 45 minutes of aerobic exercise without signs/symptoms of physical distress   Intensity THRR unchanged     Progression   Progression Continue to progress workloads to maintain intensity without signs/symptoms of physical distress.     Resistance Training   Training Prescription Yes   Weight orange bands   Reps 10-12  10 minutes of strength training     Interval Training   Interval Training No     Rower   Level 2   Minutes 17     Track   Laps 15   Minutes 17     Goals Met:  Exercise tolerated well No report of cardiac concerns or symptoms Strength training completed today  Goals Unmet:  Not Applicable  Comments: Service time is from 1330 to 1515    Dr. Rush Farmer is Medical Director for Pulmonary Rehab at [facility].

## 2016-04-11 ENCOUNTER — Encounter (HOSPITAL_COMMUNITY)
Admission: RE | Admit: 2016-04-11 | Discharge: 2016-04-11 | Disposition: A | Discharge status: Home / Self Care | Payer: Medicare HMO | Source: Ambulatory Visit | Attending: Pulmonary Disease | Admitting: Pulmonary Disease

## 2016-04-11 VITALS — Wt 131.6 lb

## 2016-04-11 DIAGNOSIS — J8410 Pulmonary fibrosis, unspecified: Secondary | ICD-10-CM | POA: Diagnosis not present

## 2016-04-11 NOTE — Progress Notes (Signed)
Daily Session Note  Patient Details  Name: [name] [name] MRN: [id number] Date of Birth: 12/13/1950 Referring Provider:   Flowsheet Row Pulmonary Rehab Walk Test from 02/15/2016 in [facility]  Referring Provider  Dr. Yacoub      Encounter Date: 04/11/2016  Check In:     Session Check In - 04/11/16 1330      Check-In   Location [facility]   Staff Present  , RN, BSN;Molly diVincenzo, MS, ACSM RCEP, Exercise Physiologist;Portia Payne, RN, BSN   Supervising physician immediately available to respond to emergencies [facility] immediately available   Physician(s) Dr. Patel   Medication changes reported     No   Fall or balance concerns reported    No   Warm-up and Cool-down Performed as group-led instruction   Resistance Training Performed Yes   VAD Patient? No     Pain Assessment   Currently in Pain? No/denies   Multiple Pain Sites No      Capillary Blood Glucose: No results found for this or any previous visit (from the past 24 hour(s)).      Exercise Prescription Changes - 04/11/16 1500      Exercise Review   Progression Yes     Response to Exercise   Blood Pressure (Admit) 104/60   Blood Pressure (Exercise) 94/50   Blood Pressure (Exit) 98/64   Heart Rate (Admit) 72 bpm   Heart Rate (Exercise) 99 bpm   Heart Rate (Exit) 68 bpm   Oxygen Saturation (Admit) 97 %   Oxygen Saturation (Exercise) 96 %   Oxygen Saturation (Exit) 96 %   Rating of Perceived Exertion (Exercise) 15   Perceived Dyspnea (Exercise) 3   Duration Progress to 45 minutes of aerobic exercise without signs/symptoms of physical distress   Intensity THRR unchanged     Progression   Progression Continue to progress workloads to maintain intensity without signs/symptoms of physical distress.     Resistance Training   Training Prescription Yes   Weight orange bands   Reps 10-12  10 minutes of strength training     NuStep   Level  5   Minutes 17     Rower   Level 2   Minutes 17     Track   Laps 13   Minutes 17     Goals Met:  Exercise tolerated well Strength training completed today  Goals Unmet:  Not Applicable  Comments: Service time is from 1330 to 1500    Dr. Wesam G. Yacoub is Medical Director for Pulmonary Rehab at [facility]. 

## 2016-04-13 ENCOUNTER — Encounter (HOSPITAL_COMMUNITY)
Admission: RE | Admit: 2016-04-13 | Discharge: 2016-04-13 | Disposition: A | Discharge status: Home / Self Care | Payer: Medicare HMO | Source: Ambulatory Visit | Attending: Pulmonary Disease | Admitting: Pulmonary Disease

## 2016-04-13 DIAGNOSIS — J8410 Pulmonary fibrosis, unspecified: Secondary | ICD-10-CM

## 2016-04-13 NOTE — Progress Notes (Signed)
Daily Session Note  Patient Details  Name: [name] [name] MRN: [id number] Date of Birth: [birth year] Referring Provider:   April Manson Pulmonary Rehab Walk Test from 02/15/2016 in [place]  Referring Provider  Dr. Nelda Marseille      Encounter Date: 04/13/2016  Check In:     Session Check In - 04/13/16 1354      Check-In   Location [facility]   Staff Present Su Hilt, MS, ACSM RCEP, Exercise Physiologist;Yarithza Mink Leonia Reeves, RN, BSN   Supervising physician immediately available to respond to emergencies [facility] immediately available   Physician(s) Dr. Broadus John   Medication changes reported     No   Fall or balance concerns reported    No   Warm-up and Cool-down Performed as group-led instruction   Resistance Training Performed Yes   VAD Patient? No     Pain Assessment   Currently in Pain? No/denies   Multiple Pain Sites No      Capillary Blood Glucose: No results found for this or any previous visit (from the past 24 hour(s)).      Exercise Prescription Changes - 04/13/16 1500      Response to Exercise   Blood Pressure (Admit) 92/50   Blood Pressure (Exercise) 122/70   Blood Pressure (Exit) 100/60   Heart Rate (Admit) 69 bpm   Heart Rate (Exercise) 76 bpm   Heart Rate (Exit) 70 bpm   Oxygen Saturation (Admit) 99 %   Oxygen Saturation (Exercise) 98 %   Oxygen Saturation (Exit) 70 %   Rating of Perceived Exertion (Exercise) 15   Perceived Dyspnea (Exercise) 3   Duration Progress to 45 minutes of aerobic exercise without signs/symptoms of physical distress   Intensity THRR unchanged     Progression   Progression Continue to progress workloads to maintain intensity without signs/symptoms of physical distress.     Resistance Training   Training Prescription Yes   Weight orange bands   Reps 10-12  10 minutes of strength training     Interval Training   Interval Training No     NuStep   Level 5   Minutes  17   METs 1.7     Rower   Level 2   Minutes 17     Goals Met:  Exercise tolerated well Strength training completed today  Goals Unmet:  Not Applicable  Comments: Service time is from1330 to 1500    Dr. Rush Farmer is Medical Director for Pulmonary Rehab at [facility].

## 2016-04-13 NOTE — Progress Notes (Addendum)
[name] [name] 66 y.o. female Nutrition Note Spoke with pt. There are many ways the pt can make her eating habits healthier.  Pt's Rate Your Plate results reviewed with pt. Pt does not avoid salty food; uses canned/ convenience food. The role of sodium in lung disease reviewed with pt. Pt wants to change dietary habits. Barriers to pt making healthier food choices include "my significant other likes to eat out." Pt expressed understanding of the information reviewed via feedback method.    No results found for: HGBA1C  Nutrition Diagnosis ? Food-and nutrition-related knowledge deficit related to lack of exposure to information as related to diagnosis of pulmonary disease  Nutrition Intervention ? Pt's individual nutrition plan and goals reviewed with pt. ? Benefits of adopting healthy eating habits discussed when pt's Rate Your Plate reviewed. ? Pt to attend the Nutrition and Lung Disease class ? Handout given for Nutrition II class out ? Continual client-centered nutrition education by RD, as part of interdisciplinary care. Goal(s) 1. Describe the benefit of including fruits, vegetables, whole grains, and low-fat dairy products in a healthy meal plan. 2. Pt to decrease consumption of simple carbs (e.g. Soda and sweet tea)  Monitor and Evaluate progress toward nutrition goal with team.   Derek Mound, M.Ed, RD, LDN, CDE 04/13/2016 3:15 PM

## 2016-04-18 ENCOUNTER — Encounter (HOSPITAL_COMMUNITY): Payer: Medicare HMO

## 2016-04-20 ENCOUNTER — Telehealth (HOSPITAL_COMMUNITY): Payer: Self-pay | Admitting: General Practice

## 2016-04-20 ENCOUNTER — Encounter (HOSPITAL_COMMUNITY): Payer: Medicare HMO

## 2016-04-20 NOTE — Telephone Encounter (Signed)
Pt called states she can't be in for another week due to being hospitalized after the week of 04/30/16.... KJ

## 2016-04-25 ENCOUNTER — Encounter (HOSPITAL_COMMUNITY): Payer: Medicare HMO

## 2016-04-25 ENCOUNTER — Inpatient Hospital Stay (HOSPITAL_COMMUNITY)
Admission: RE | Admit: 2016-04-25 | Discharge: 2016-04-25 | Disposition: A | Discharge status: Home / Self Care | Payer: Self-pay | Source: Ambulatory Visit

## 2016-04-27 ENCOUNTER — Other Ambulatory Visit (HOSPITAL_COMMUNITY): Payer: Self-pay

## 2016-04-27 ENCOUNTER — Inpatient Hospital Stay (HOSPITAL_COMMUNITY)
Admission: RE | Admit: 2016-04-27 | Discharge: 2016-04-27 | Disposition: A | Discharge status: Home / Self Care | Payer: Self-pay | Source: Ambulatory Visit

## 2016-04-27 ENCOUNTER — Telehealth (HOSPITAL_COMMUNITY): Payer: Self-pay | Admitting: *Deleted

## 2016-04-27 ENCOUNTER — Ambulatory Visit: Payer: Self-pay | Admitting: Physician Assistant

## 2016-04-27 ENCOUNTER — Encounter (HOSPITAL_COMMUNITY): Payer: Medicare HMO

## 2016-04-27 DIAGNOSIS — J8410 Pulmonary fibrosis, unspecified: Secondary | ICD-10-CM

## 2016-04-27 NOTE — Progress Notes (Signed)
Pulmonary Individual Treatment Plan  Patient Details  Name: [name] [name] MRN: [id number] Date of Birth: 06/17/1950 Referring Provider:   Flowsheet Row Pulmonary Rehab Walk Test from 02/15/2016 in [facility]  Referring Provider  Dr. Yacoub      Initial Encounter Date:  Flowsheet Row Pulmonary Rehab Walk Test from 02/15/2016 in [facility]  Date  02/15/16  Referring Provider  Dr. Yacoub      Visit Diagnosis: Pulmonary fibrosis (HCC)  Patient's Home Medications on Admission:   Current Outpatient Prescriptions:  .  albuterol (PROVENTIL HFA;VENTOLIN HFA) 108 (90 Base) MCG/ACT inhaler, INHALE 2 PUFFS BY MOUTH AS DIRECTED AS NEEDED FOR SHORTNESS OF BREATH, Disp: , Rfl:  .  amitriptyline (ELAVIL) 50 MG tablet, Take 50 mg by mouth at bedtime.  , Disp: , Rfl:  .  aspirin EC 81 MG tablet, Take 1 tablet (81 mg total) by mouth daily., Disp: 90 tablet, Rfl: 3 .  budesonide-formoterol (SYMBICORT) 160-4.5 MCG/ACT inhaler, Inhale 2 puffs into the lungs 2 (two) times daily as needed. , Disp: , Rfl:  .  clobetasol cream (TEMOVATE) 0.05 %, Apply 1 application topically 2 (two) times daily as needed. APPLY TO AFFECTED FINGER 1 TO 2 TIMES D WITH AT LEAST 1 APPLICATION UNDER OCCLUSION, Disp: , Rfl:  .  conjugated estrogens (PREMARIN) vaginal cream, Place 0.25 Applicatorfuls vaginally 3 (three) times a week. Pt only to use 1/4 of the applicator, Disp: , Rfl:  .  docusate sodium (COLACE) 100 MG capsule, Take 100 mg by mouth daily as needed for mild constipation. , Disp: , Rfl:  .  emtricitabine-tenofovir (TRUVADA) 200-300 MG tablet, TAKE 1 TABLET BY MOUTH EVERY DAY, Disp: , Rfl:  .  isosorbide mononitrate (IMDUR) 30 MG 24 hr tablet, Take 1 tablet (30 mg total) by mouth daily., Disp: 30 tablet, Rfl: 12 .  nitroGLYCERIN (NITROSTAT) 0.4 MG SL tablet, Place 1 tablet (0.4 mg total) under the tongue every 5 (five) minutes as needed for chest pain., Disp:  30 tablet, Rfl: 12 .  omeprazole (PRILOSEC) 40 MG capsule, Take 40 mg by mouth every morning. , Disp: , Rfl:  .  Pirfenidone (ESBRIET) 267 MG CAPS, Take 2 capsules by mouth 2 (two) times daily., Disp: , Rfl:  .  polyethylene glycol (MIRALAX / GLYCOLAX) packet, Take 17 g by mouth daily as needed. , Disp: , Rfl:  .  pravastatin (PRAVACHOL) 80 MG tablet, Take 1 tablet by mouth daily., Disp: , Rfl:  .  raltegravir (ISENTRESS) 400 MG tablet, TAKE 1 TABLET BY MOUTH TWICE DAILY, Disp: , Rfl:  .  senna-docusate (SENOKOT-S) 8.6-50 MG tablet, Take 1 tablet by mouth daily as needed for mild constipation. , Disp: , Rfl:  .  sucralfate (CARAFATE) 1 g tablet, Take 1 g by mouth 2 (two) times daily. , Disp: , Rfl:   Past Medical History: Past Medical History:  Diagnosis Date  . Asthma   . Bradycardia    Sinus bradycardia  . CAD (coronary artery disease)    Minimal by history, Question coronary artery spasm  . Colon polyps   . Dyslipidemia   . Ejection fraction   . GERD (gastroesophageal reflux disease)    Esophageal stricture  . H/O: hysterectomy   . HIV positive (HCC)    Needle stick 1993  . Hyperlipidemia   . Palpitations   . Peptic ulcer disease   . Shortness of breath    Extensive evaluation Dr. Peter Morris,   [place], May, 2706, etiology uncertain, probable bronchiolitis possibility of lung biopsy at that time  . Weight loss     Tobacco Use: History  Smoking Status  . Former Smoker  . Quit date: 03/13/1996  Smokeless Tobacco  . Never Used    Labs: Recent Review Flowsheet Data    Labs for ITP Cardiac and Pulmonary Rehab Latest Ref Rng & Units 03/21/2016 03/21/2016   PHART 7.350 - 7.450 - 7.336(L)   PCO2ART 32.0 - 48.0 mmHg - 36.5   HCO3 20.0 - 28.0 mmol/L 19.1(L) 19.5(L)   TCO2 0 - 100 mmol/L 20 21   ACIDBASEDEF 0.0 - 2.0 mmol/L 7.0(H) 6.0(H)   O2SAT % 73.0 99.0      Capillary Blood Glucose: Lab Results  Component Value Date   GLUCAP 87 04/10/2014     ADL UCSD:   Pulmonary  Function Assessment:     Pulmonary Function Assessment - 02/07/16 1450      Breath   Bilateral Breath Sounds Other  fine crackles mid to lower lobes bilat.    Shortness of Breath Yes;Limiting activity      Exercise Target Goals:    Exercise Program Goal: Individual exercise prescription set with THRR, safety & activity barriers. Participant demonstrates ability to understand and report RPE using BORG scale, to self-measure pulse accurately, and to acknowledge the importance of the exercise prescription.  Exercise Prescription Goal: Starting with aerobic activity 30 plus minutes a day, 3 days per week for initial exercise prescription. Provide home exercise prescription and guidelines that participant acknowledges understanding prior to discharge.  Activity Barriers & Risk Stratification:     Activity Barriers & Cardiac Risk Stratification - 02/07/16 1448      Activity Barriers & Cardiac Risk Stratification   Activity Barriers Shortness of Breath      6 Minute Walk:     6 Minute Walk    Row Name 02/15/16 1609         6 Minute Walk   Phase Initial     Distance 1224 feet     Walk Time 6 minutes     # of Rest Breaks 0     MPH 2.31     METS 2.76     RPE 13     Perceived Dyspnea  3     Symptoms No     Resting HR 67 bpm     Resting BP 112/70     Max Ex. HR 85 bpm     Max Ex. BP 118/68       Interval HR   Baseline HR 67     1 Minute HR 81     2 Minute HR 80     3 Minute HR 85     4 Minute HR 84     5 Minute HR 86     6 Minute HR 85     2 Minute Post HR 80     Interval Heart Rate? Yes       Interval Oxygen   Interval Oxygen? Yes     Baseline Oxygen Saturation % 99 %     Baseline Liters of Oxygen 0 L     1 Minute Oxygen Saturation % 99 %     1 Minute Liters of Oxygen 0 L     2 Minute Oxygen Saturation % 90 %     2 Minute Liters of Oxygen 0 L     3 Minute Oxygen Saturation % 94 %  3 Minute Liters of Oxygen 0 L     4 Minute Oxygen Saturation % 93 %      4 Minute Liters of Oxygen 0 L     5 Minute Oxygen Saturation % 99 %     5 Minute Liters of Oxygen 0 L     6 Minute Oxygen Saturation % 93 %     6 Minute Liters of Oxygen 0 L     2 Minute Post Oxygen Saturation % 100 %     2 Minute Post Liters of Oxygen 0 L        Initial Exercise Prescription:     Initial Exercise Prescription - 02/15/16 1600      Date of Initial Exercise RX and Referring Provider   Date 02/15/16   Referring Provider Dr. Yacoub     NuStep   Level 1   Minutes 17   METs 1.5     Rower   Level 1   Minutes 17     Track   Laps 5   Minutes 17     Prescription Details   Frequency (times per week) 2   Duration Progress to 45 minutes of aerobic exercise without signs/symptoms of physical distress     Intensity   THRR 40-80% of Max Heartrate 62-125   Ratings of Perceived Exertion 11-13   Perceived Dyspnea 0-4     Progression   Progression Continue progressive overload as per policy without signs/symptoms or physical distress.     Resistance Training   Training Prescription Yes   Weight orange bands   Reps 10-12      Perform Capillary Blood Glucose checks as needed.  Exercise Prescription Changes:     Exercise Prescription Changes    Row Name 02/22/16 1500 02/24/16 1500 02/29/16 1600 03/02/16 1500 03/07/16 1500     Exercise Review   Progression  -  - Yes  -  -     Response to Exercise   Blood Pressure (Admit) 110/62 112/60 96/58 128/70 108/62   Blood Pressure (Exercise) 104/60 130/64 112/80 96/62 98/58   Blood Pressure (Exit) 98/60 108/82 98/60 94/60 92/54   Heart Rate (Admit) 77 bpm 76 bpm 79 bpm 82 bpm 67 bpm   Heart Rate (Exercise) 96 bpm 92 bpm 102 bpm 81 bpm 87 bpm   Heart Rate (Exit) 68 bpm 68 bpm 74 bpm 66 bpm 71 bpm   Oxygen Saturation (Admit) 95 % 99 % 99 % 98 % 99 %   Oxygen Saturation (Exercise) 96 % 98 % 95 % 98 % 98 %   Oxygen Saturation (Exit) 93 % 92 % 98 % 97 % 98 %   Rating of Perceived Exertion (Exercise) 15 13 16 17 15    Perceived Dyspnea (Exercise) 2 3 3 3 2   Duration Progress to 45 minutes of aerobic exercise without signs/symptoms of physical distress Progress to 45 minutes of aerobic exercise without signs/symptoms of physical distress Progress to 45 minutes of aerobic exercise without signs/symptoms of physical distress Progress to 45 minutes of aerobic exercise without signs/symptoms of physical distress Progress to 45 minutes of aerobic exercise without signs/symptoms of physical distress   Intensity -  40-80% HRR -  40-80% HRR -  40-80% HRR -  40-80% HRR THRR unchanged     Progression   Progression Continue to progress workloads to maintain intensity without signs/symptoms of physical distress. Continue to progress workloads to maintain intensity without signs/symptoms of physical distress. Continue to   progress workloads to maintain intensity without signs/symptoms of physical distress. Continue to progress workloads to maintain intensity without signs/symptoms of physical distress. Continue to progress workloads to maintain intensity without signs/symptoms of physical distress.     Resistance Training   Training Prescription _0    Weight _1    Reps 10-12  10 minutes of strength training 10-12  10 minutes of strength training 10-12  10 minutes of strength training 10-12  10 minutes of strength training 10-12  10 minutes of strength training     Interval Training   Interval Training _2      NuStep   Level 1  - _3 Minutes 17  - _4 METs 2.2  - 1.9 2.2 1.9     Rower   Level _5 Minutes _6 Track   Laps _7 - 14   Minutes _8 - 17   Row Name 03/14/16 1500 03/16/16 1500 03/28/16 1531 04/04/16 1600 04/06/16 1600     Response to Exercise   Blood Pressure (Admit) 110/68 112/60 102/60 102/66 112/64   Blood Pressure (Exercise) 100/60 96/64 100/62 110/50  100/58   Blood Pressure (Exit) 96/60 100/64 98/60 100/56 104/64   Heart Rate (Admit) 76 bpm 68 bpm 81 bpm 92 bpm 72 bpm   Heart Rate (Exercise) 94 bpm 84 bpm 98 bpm 102 bpm 79 bpm   Heart Rate (Exit) 72 bpm 78 bpm 88 bpm 75 bpm 70 bpm   Oxygen Saturation (Admit) 98 % 100 % 96 % 95 % 96 %   Oxygen Saturation (Exercise) 98 % 97 % 94 % 93 % 96 %   Oxygen Saturation (Exit) 95 % 98 % 97 % 96 % 98 %   Rating of Perceived Exertion (Exercise) _9 Perceived Dyspnea (Exercise) _10 Duration Progress to 45 minutes of aerobic exercise without signs/symptoms of physical distress Progress to 45 minutes of aerobic exercise without signs/symptoms of physical distress Progress to 45 minutes of aerobic exercise without signs/symptoms of physical distress Progress to 45 minutes of aerobic exercise without signs/symptoms of physical distress Progress to 45 minutes of aerobic exercise without signs/symptoms of physical distress   Intensity _11      Progression   Progression Continue to progress workloads to maintain intensity without signs/symptoms of physical distress. Continue to progress workloads to maintain intensity without signs/symptoms of physical distress. Continue to progress workloads to maintain intensity without signs/symptoms of physical distress. Continue to progress workloads to maintain intensity without signs/symptoms of physical distress. Continue to progress workloads to maintain intensity without signs/symptoms of physical distress.     Resistance Training   Training Prescription _12    Weight _13    Reps 10-12  10 minutes of strength training 10-12  10 minutes of strength training 10-12  10 minutes of strength training 10-12  10 minutes of strength training 10-12  10 minutes of strength training     Interval Training    Interval Training _14      NuStep   Level 2  - 4 4  -   Minutes 17  -  17 17  -   METs 1.8  - 1.7  -  -     Rower   Level _0 Minutes _1 Track   Laps _2 Minutes _3 Row Name 04/11/16 1500 04/13/16 1500           Exercise Review   Progression Yes  -        Response to Exercise   Blood Pressure (Admit) 104/60 92/50      Blood Pressure (Exercise) 94/50 122/70      Blood Pressure (Exit) 98/64 100/60      Heart Rate (Admit) 72 bpm 69 bpm      Heart Rate (Exercise) 99 bpm 76 bpm      Heart Rate (Exit) 68 bpm 70 bpm      Oxygen Saturation (Admit) 97 % 99 %      Oxygen Saturation (Exercise) 96 % 98 %      Oxygen Saturation (Exit) 96 % 70 %      Rating of Perceived Exertion (Exercise) 15 15      Perceived Dyspnea (Exercise) 3 3      Duration Progress to 45 minutes of aerobic exercise without signs/symptoms of physical distress Progress to 45 minutes of aerobic exercise without signs/symptoms of physical distress      Intensity THRR unchanged THRR unchanged        Progression   Progression Continue to progress workloads to maintain intensity without signs/symptoms of physical distress. Continue to progress workloads to maintain intensity without signs/symptoms of physical distress.        Resistance Training   Training Prescription Yes Yes      Weight orange bands orange bands      Reps 10-12  10 minutes of strength training 10-12  10 minutes of strength training        Interval Training   Interval Training  - No        NuStep   Level 5 5      Minutes 17 17      METs  - 1.7        Rower   Level 2 2      Minutes 17 17        Track   Laps 13  -      Minutes 17  -         Exercise Comments:     Exercise Comments    Row Name 03/02/16 1002 04/04/16 0826 04/24/16 1547       Exercise Comments Patient has only attended three exercise sessions. Will cont. to monitor.  Patient walkes up to 16 laps in 15  minutes. MET level places her at a low level. Rates her level of exertion at a low level. Will cont. to monitor and progress.  Patient has been admitted into the hospital for chest pain. Will cont. to follow up with patient when she returns to rehab.         Discharge Exercise Prescription (Final Exercise Prescription Changes):     Exercise Prescription Changes - 04/13/16 1500      Response to Exercise   Blood Pressure (Admit) 92/50   Blood Pressure (Exercise) 122/70   Blood Pressure (Exit) 100/60   Heart Rate (Admit) 69 bpm   Heart Rate (Exercise) 76 bpm   Heart Rate (Exit) 70 bpm  Oxygen Saturation (Admit) 99 %   Oxygen Saturation (Exercise) 98 %   Oxygen Saturation (Exit) 70 %   Rating of Perceived Exertion (Exercise) 15   Perceived Dyspnea (Exercise) 3   Duration Progress to 45 minutes of aerobic exercise without signs/symptoms of physical distress   Intensity THRR unchanged     Progression   Progression Continue to progress workloads to maintain intensity without signs/symptoms of physical distress.     Resistance Training   Training Prescription Yes   Weight orange bands   Reps 10-12  10 minutes of strength training     Interval Training   Interval Training No     NuStep   Level 5   Minutes 17   METs 1.7     Rower   Level 2   Minutes 17       Nutrition:  Target Goals: Understanding of nutrition guidelines, daily intake of sodium <1500mg, cholesterol <200mg, calories 30% from fat and 7% or less from saturated fats, daily to have 5 or more servings of fruits and vegetables.  Biometrics:     Pre Biometrics - 02/07/16 1505      Pre Biometrics   Grip Strength 20 kg       Nutrition Therapy Plan and Nutrition Goals:     Nutrition Therapy & Goals - 04/13/16 1512      Nutrition Therapy   Diet Therapeutic Lifestyle Changes     Personal Nutrition Goals   Personal Goal #1 Pt to maintain her UBW around 128-135 lb   Personal Goal #2 Decrease  consumption of simple carbs (e.g. Coke, sweet tea, desserts)     Intervention Plan   Intervention Prescribe, educate and counsel regarding individualized specific dietary modifications aiming towards targeted core components such as weight, hypertension, lipid management, diabetes, heart failure and other comorbidities.   Expected Outcomes Short Term Goal: Understand basic principles of dietary content, such as calories, fat, sodium, cholesterol and nutrients.;Long Term Goal: Adherence to prescribed nutrition plan.      Nutrition Discharge: Rate Your Plate Scores:     Nutrition Assessments - 04/13/16 1510      Rate Your Plate Scores   Pre Score 38      Psychosocial: Target Goals: Acknowledge presence or absence of depression, maximize coping skills, provide positive support system. Participant is able to verbalize types and ability to use techniques and skills needed for reducing stress and depression.  Initial Review & Psychosocial Screening:     Initial Psych Review & Screening - 02/07/16 1457      Family Dynamics   Good Support System? Yes     Barriers   Psychosocial barriers to participate in program There are no identifiable barriers or psychosocial needs.;The patient should benefit from training in stress management and relaxation.     Screening Interventions   Interventions Encouraged to exercise      Quality of Life Scores:   PHQ-9: Recent Review Flowsheet Data    Depression screen PHQ 2/9 02/07/2016   Decreased Interest 1   Down, Depressed, Hopeless 0   PHQ - 2 Score 1      Psychosocial Evaluation and Intervention:   Psychosocial Re-Evaluation:     Psychosocial Re-Evaluation    Row Name 03/02/16 1111 04/03/16 1441 04/25/16 0850         Psychosocial Re-Evaluation   Interventions Encouraged to attend Pulmonary Rehabilitation for the exercise Encouraged to attend Pulmonary Rehabilitation for the exercise Encouraged to attend Pulmonary Rehabilitation  for the exercise       Comments No psychosocial issues identified No psychosocial issues identified recent hospitalization is a psychosocial issue she is dealing with     Continued Psychosocial Services Needed No No No       Education: Education Goals: Education classes will be provided on a weekly basis, covering required topics. Participant will state understanding/return demonstration of topics presented.  Learning Barriers/Preferences:     Learning Barriers/Preferences - 02/07/16 1450      Learning Barriers/Preferences   Learning Barriers None   Learning Preferences Group Instruction;Written Material;Individual Instruction      Education Topics: Risk Factor Reduction:  -Group instruction that is supported by a PowerPoint presentation. Instructor discusses the definition of a risk factor, different risk factors for pulmonary disease, and how the heart and lungs work together.     Nutrition for Pulmonary Patient:  -Group instruction provided by PowerPoint slides, verbal discussion, and written materials to support subject matter. The instructor gives an explanation and review of healthy diet recommendations, which includes a discussion on weight management, recommendations for fruit and vegetable consumption, as well as protein, fluid, caffeine, fiber, sodium, sugar, and alcohol. Tips for eating when patients are short of breath are discussed.   Pursed Lip Breathing:  -Group instruction that is supported by demonstration and informational handouts. Instructor discusses the benefits of pursed lip and diaphragmatic breathing and detailed demonstration on how to preform both.     Oxygen Safety:  -Group instruction provided by PowerPoint, verbal discussion, and written material to support subject matter. There is an overview of "What is Oxygen" and "Why do we need it".  Instructor also reviews how to create a safe environment for oxygen use, the importance of using oxygen as prescribed,  and the risks of noncompliance. There is a brief discussion on traveling with oxygen and resources the patient may utilize.   Oxygen Equipment:  -Group instruction provided by [facility] Staff utilizing handouts, written materials, and equipment demonstrations. Flowsheet Row PULMONARY REHAB OTHER RESPIRATORY from 04/13/2016 in [place]  Date  03/16/16  Educator  [employer]  Instruction Review Code  2- meets goals/outcomes      Signs and Symptoms:  -Group instruction provided by written material and verbal discussion to support subject matter. Warning signs and symptoms of infection, stroke, and heart attack are reviewed and when to call the physician/911 reinforced. Tips for preventing the spread of infection discussed. Flowsheet Row PULMONARY REHAB OTHER RESPIRATORY from 04/13/2016 in [place]  Date  04/13/16  Educator  rn  Instruction Review Code  2- meets goals/outcomes      Advanced Directives:  -Group instruction provided by verbal instruction and written material to support subject matter. Instructor reviews Advanced Directive laws and proper instruction for filling out document.   Pulmonary Video:  -Group video education that reviews the importance of medication and oxygen compliance, exercise, good nutrition, pulmonary hygiene, and pursed lip and diaphragmatic breathing for the pulmonary patient.   Exercise for the Pulmonary Patient:  -Group instruction that is supported by a PowerPoint presentation. Instructor discusses benefits of exercise, core components of exercise, frequency, duration, and intensity of an exercise routine, importance of utilizing pulse oximetry during exercise, safety while exercising, and options of places to exercise outside of rehab.     Pulmonary Medications:  -Verbally interactive group education provided by instructor with focus on inhaled medications and proper  administration. Flowsheet Row PULMONARY REHAB OTHER RESPIRATORY from 04/13/2016 in [place]  Date  04/06/16  Educator  [facility]  Instruction Review Code  2- meets goals/outcomes      Anatomy and Physiology of the Respiratory System and Intimacy:  -Group instruction provided by PowerPoint, verbal discussion, and written material to support subject matter. Instructor reviews respiratory cycle and anatomical components of the respiratory system and their functions. Instructor also reviews differences in obstructive and restrictive respiratory diseases with examples of each. Intimacy, Sex, and Sexuality differences are reviewed with a discussion on how relationships can change when diagnosed with pulmonary disease. Common sexual concerns are reviewed. Flowsheet Row PULMONARY REHAB OTHER RESPIRATORY from 04/13/2016 in [facility]  Date  02/24/16  Educator  rn  Instruction Review Code  2- meets goals/outcomes      Knowledge Questionnaire Score:   Core Components/Risk Factors/Patient Goals at Admission:     Personal Goals and Risk Factors at Admission - 02/07/16 1456      Core Components/Risk Factors/Patient Goals on Admission   Improve shortness of breath with ADL's Yes   Intervention Provide education, individualized exercise plan and daily activity instruction to help decrease symptoms of SOB with activities of daily living.   Expected Outcomes Short Term: Achieves a reduction of symptoms when performing activities of daily living.   Develop more efficient breathing techniques such as purse lipped breathing and diaphragmatic breathing; and practicing self-pacing with activity Yes   Intervention Provide education, demonstration and support about specific breathing techniuqes utilized for more efficient breathing. Include techniques such as pursed lipped breathing, diaphragmatic breathing and self-pacing activity.   Expected Outcomes  Short Term: Participant will be able to demonstrate and use breathing techniques as needed throughout daily activities.      Core Components/Risk Factors/Patient Goals Review:      Goals and Risk Factor Review    Row Name 03/02/16 1109 04/03/16 1439 04/25/16 0845 04/25/16 0849       Core Components/Risk Factors/Patient Goals Review   Personal Goals Review Develop more efficient breathing techniques such as purse lipped breathing and diaphragmatic breathing and practicing self-pacing with activity.;Improve shortness of breath with ADL's Develop more efficient breathing techniques such as purse lipped breathing and diaphragmatic breathing and practicing self-pacing with activity.;Improve shortness of breath with ADL's Develop more efficient breathing techniques such as purse lipped breathing and diaphragmatic breathing and practicing self-pacing with activity.;Improve shortness of breath with ADL's  -    Review has attended 3 exercise sessions, too early to see improvement absent for 1 week d/t cardiac catheterization with minimal CAD results, slowly progressing ack in the hospital, reason unknown, unable to contact patient  -    Expected Outcomes expect to see progress in the next 30 days will continue to gain strength and stamina as workloads increase  - will continue to work on goals if she is cleared to return to exercise.       Core Components/Risk Factors/Patient Goals at Discharge (Final Review):      Goals and Risk Factor Review - 04/25/16 0849      Core Components/Risk Factors/Patient Goals Review   Expected Outcomes will continue to work on goals if she is cleared to return to exercise.      ITP Comments:   Comments: ITP REVIEW Pt is making expected progress toward pulmonary rehab goals after completing 12 sessions. Recommend continued exercise, life style modification, education, and utilization of breathing techniques to increase stamina and strength and decrease shortness  of breath with exertion.   

## 2016-04-27 NOTE — Progress Notes (Signed)
Telephone call placed to [name] [name], she is to wear a telemetry monitor at home for 2 weeks and her doctor has ordered home physical therapy and is dropping out of pulmonary rehab.  She is being evaluated for a pacemaker.

## 2016-05-02 ENCOUNTER — Ambulatory Visit (HOSPITAL_COMMUNITY): Payer: Self-pay

## 2016-05-02 ENCOUNTER — Encounter (HOSPITAL_COMMUNITY): Payer: Medicare HMO

## 2016-05-02 ENCOUNTER — Telehealth: Payer: Self-pay | Admitting: Cardiology

## 2016-05-02 NOTE — Telephone Encounter (Signed)
Dr Meda Coffee, I spoke with patient she advised she was admitted to [facility] about 1 month ago and the Echo and monitor was done at that time.  The patient plans on getting the notes from the test sent to our office.  The patient wants to remove the orders we have in our system.  At this time I have removed the orders from the work que.   Davy Pique

## 2016-05-02 NOTE — Telephone Encounter (Signed)
Will route this information to Dr Meda Coffee as an Juluis Rainier.

## 2016-05-04 ENCOUNTER — Ambulatory Visit: Payer: Self-pay | Admitting: Cardiology

## 2016-05-04 ENCOUNTER — Ambulatory Visit (HOSPITAL_COMMUNITY): Payer: Self-pay

## 2016-05-04 ENCOUNTER — Encounter (HOSPITAL_COMMUNITY): Payer: Medicare HMO

## 2016-05-09 ENCOUNTER — Encounter (HOSPITAL_COMMUNITY): Payer: Medicare HMO

## 2016-05-09 ENCOUNTER — Ambulatory Visit (HOSPITAL_COMMUNITY): Payer: Self-pay

## 2016-05-10 NOTE — Addendum Note (Signed)
Encounter addended by: Jewel Baize, RD on: 05/10/2016  1:34 PM<BR>    Actions taken: Flowsheet data copied forward, Visit Navigator Flowsheet section accepted

## 2016-05-11 ENCOUNTER — Ambulatory Visit (HOSPITAL_COMMUNITY): Payer: Self-pay

## 2016-05-11 ENCOUNTER — Encounter (HOSPITAL_COMMUNITY): Payer: Medicare Other

## 2016-05-11 NOTE — Progress Notes (Signed)
Discharge Summary  Patient Details  Name: [name] [name] MRN: [id number] Date of Birth: [birth year] Referring Provider:   April Manson Pulmonary Rehab Walk Test from 02/15/2016 in [place]  Referring Provider  Dr. Nelda Marseille       Number of Visits: 12  Reason for Discharge:  Early Exit:  Due to chest pain and cardiology evaluation  Smoking History:  History  Smoking Status  . Former Smoker  . Quit date: 03/13/1996  Smokeless Tobacco  . Never Used    Diagnosis:  Pulmonary fibrosis ([place])  ADL UCSD:     Pulmonary Assessment Scores    Row Name 02/17/16 1400         ADL UCSD   ADL Phase Entry     SOB Score total 73        Initial Exercise Prescription:     Initial Exercise Prescription - 02/15/16 1600      Date of Initial Exercise RX and Referring Provider   Date 02/15/16   Referring Provider Dr. Nelda Marseille     NuStep   Level 1   Minutes 17   METs 1.5     Rower   Level 1   Minutes 17     Track   Laps 5   Minutes 17     Prescription Details   Frequency (times per week) 2   Duration Progress to 45 minutes of aerobic exercise without signs/symptoms of physical distress     Intensity   THRR 40-80% of Max Heartrate 62-125   Ratings of Perceived Exertion 11-13   Perceived Dyspnea 0-4     Progression   Progression Continue progressive overload as per policy without signs/symptoms or physical distress.     Resistance Training   Training Prescription Yes   Weight orange bands   Reps 10-12      Discharge Exercise Prescription (Final Exercise Prescription Changes):     Exercise Prescription Changes - 04/13/16 1500      Response to Exercise   Blood Pressure (Admit) 92/50   Blood Pressure (Exercise) 122/70   Blood Pressure (Exit) 100/60   Heart Rate (Admit) 69 bpm   Heart Rate (Exercise) 76 bpm   Heart Rate (Exit) 70 bpm   Oxygen Saturation (Admit) 99 %   Oxygen Saturation (Exercise) 98 %   Oxygen Saturation (Exit) 70  %   Rating of Perceived Exertion (Exercise) 15   Perceived Dyspnea (Exercise) 3   Duration Progress to 45 minutes of aerobic exercise without signs/symptoms of physical distress   Intensity THRR unchanged     Progression   Progression Continue to progress workloads to maintain intensity without signs/symptoms of physical distress.     Resistance Training   Training Prescription Yes   Weight orange bands   Reps 10-12  10 minutes of strength training     Interval Training   Interval Training No     NuStep   Level 5   Minutes 17   METs 1.7     Rower   Level 2   Minutes 17      Functional Capacity:     6 Minute Walk    Row Name 02/15/16 1609         6 Minute Walk   Phase Initial     Distance 1224 feet     Walk Time 6 minutes     # of Rest Breaks 0     MPH 2.31     METS 2.76  RPE 13     Perceived Dyspnea  3     Symptoms No     Resting HR 67 bpm     Resting BP 112/70     Max Ex. HR 85 bpm     Max Ex. BP 118/68       Interval HR   Baseline HR 67     1 Minute HR 81     2 Minute HR 80     3 Minute HR 85     4 Minute HR 84     5 Minute HR 86     6 Minute HR 85     2 Minute Post HR 80     Interval Heart Rate? Yes       Interval Oxygen   Interval Oxygen? Yes     Baseline Oxygen Saturation % 99 %     Baseline Liters of Oxygen 0 L     1 Minute Oxygen Saturation % 99 %     1 Minute Liters of Oxygen 0 L     2 Minute Oxygen Saturation % 90 %     2 Minute Liters of Oxygen 0 L     3 Minute Oxygen Saturation % 94 %     3 Minute Liters of Oxygen 0 L     4 Minute Oxygen Saturation % 93 %     4 Minute Liters of Oxygen 0 L     5 Minute Oxygen Saturation % 99 %     5 Minute Liters of Oxygen 0 L     6 Minute Oxygen Saturation % 93 %     6 Minute Liters of Oxygen 0 L     2 Minute Post Oxygen Saturation % 100 %     2 Minute Post Liters of Oxygen 0 L        Psychological, QOL, Others - Outcomes: PHQ 2/9: Depression screen PHQ 2/9 02/07/2016  Decreased  Interest 1  Down, Depressed, Hopeless 0  PHQ - 2 Score 1    Quality of Life:     Quality of Life - 02/17/16 1401      Quality of Life Scores   Health/Function Pre 16.8 %   Socioeconomic Pre 23.11 %   Psych/Spiritual Pre 22.5 %   Family Pre 16.5 %   GLOBAL Pre 19.62 %      Personal Goals: Goals established at orientation with interventions provided to work toward goal.     Personal Goals and Risk Factors at Admission - 02/07/16 1456      Core Components/Risk Factors/Patient Goals on Admission   Improve shortness of breath with ADL's Yes   Intervention Provide education, individualized exercise plan and daily activity instruction to help decrease symptoms of SOB with activities of daily living.   Expected Outcomes Short Term: Achieves a reduction of symptoms when performing activities of daily living.   Develop more efficient breathing techniques such as purse lipped breathing and diaphragmatic breathing; and practicing self-pacing with activity Yes   Intervention Provide education, demonstration and support about specific breathing techniuqes utilized for more efficient breathing. Include techniques such as pursed lipped breathing, diaphragmatic breathing and self-pacing activity.   Expected Outcomes Short Term: Participant will be able to demonstrate and use breathing techniques as needed throughout daily activities.       Personal Goals Discharge:     Goals and Risk Factor Review    Row Name 03/02/16 1109 04/03/16 1439 04/25/16 0845 04/25/16 [id number]  Core Components/Risk Factors/Patient Goals Review   Personal Goals Review Develop more efficient breathing techniques such as purse lipped breathing and diaphragmatic breathing and practicing self-pacing with activity.;Improve shortness of breath with ADL's Develop more efficient breathing techniques such as purse lipped breathing and diaphragmatic breathing and practicing self-pacing with activity.;Improve shortness of  breath with ADL's Develop more efficient breathing techniques such as purse lipped breathing and diaphragmatic breathing and practicing self-pacing with activity.;Improve shortness of breath with ADL's  -    Review has attended 3 exercise sessions, too early to see improvement absent for 1 week d/t cardiac catheterization with minimal CAD results, slowly progressing ack in the hospital, reason unknown, unable to contact patient  -    Expected Outcomes expect to see progress in the next 30 days will continue to gain strength and stamina as workloads increase  - will continue to work on goals if she is cleared to return to exercise.       Nutrition & Weight - Outcomes:     Pre Biometrics - 02/07/16 1505      Pre Biometrics   Grip Strength 20 kg       Nutrition:     Nutrition Therapy & Goals - 04/13/16 1512      Nutrition Therapy   Diet Therapeutic Lifestyle Changes     Personal Nutrition Goals   Nutrition Goal Pt to maintain her UBW around 128-135 lb   Personal Goal #2 Decrease consumption of simple carbs (e.g. Coke, sweet tea, desserts)     Intervention Plan   Intervention Prescribe, educate and counsel regarding individualized specific dietary modifications aiming towards targeted core components such as weight, hypertension, lipid management, diabetes, heart failure and other comorbidities.   Expected Outcomes Short Term Goal: Understand basic principles of dietary content, such as calories, fat, sodium, cholesterol and nutrients.;Long Term Goal: Adherence to prescribed nutrition plan.      Nutrition Discharge:     Nutrition Assessments - 04/13/16 1510      Rate Your Plate Scores   Pre Score 38      Education Questionnaire Score:     Knowledge Questionnaire Score - 02/17/16 1400      Knowledge Questionnaire Score   Pre Score 12/13      Goals reviewed with patient; copy given to patient.

## 2016-05-11 NOTE — Addendum Note (Signed)
Encounter addended by: Lance Morin, RN on: 05/11/2016 10:23 AM<BR>    Actions taken: Sign clinical note, Episode resolved

## 2016-05-16 ENCOUNTER — Encounter (HOSPITAL_COMMUNITY): Payer: Medicare Other

## 2016-05-18 ENCOUNTER — Encounter (HOSPITAL_COMMUNITY): Payer: Medicare Other

## 2016-05-23 ENCOUNTER — Encounter (HOSPITAL_COMMUNITY): Payer: Medicare Other

## 2016-05-25 ENCOUNTER — Encounter (HOSPITAL_COMMUNITY): Payer: Medicare Other

## 2016-05-30 ENCOUNTER — Encounter (HOSPITAL_COMMUNITY): Payer: Medicare Other

## 2016-06-01 ENCOUNTER — Encounter (HOSPITAL_COMMUNITY): Payer: Medicare Other

## 2016-06-06 ENCOUNTER — Encounter (HOSPITAL_COMMUNITY): Payer: Medicare Other

## 2016-06-08 ENCOUNTER — Encounter (HOSPITAL_COMMUNITY): Payer: Medicare Other

## 2016-06-26 ENCOUNTER — Encounter (HOSPITAL_COMMUNITY): Payer: Self-pay

## 2016-06-26 ENCOUNTER — Encounter (HOSPITAL_COMMUNITY)
Admission: RE | Admit: 2016-06-26 | Discharge: 2016-06-26 | Disposition: A | Discharge status: Home / Self Care | Payer: Medicare Other | Source: Ambulatory Visit | Attending: Pulmonary Disease | Admitting: Pulmonary Disease

## 2016-06-26 VITALS — BP 115/67 | HR 64 | Resp 18 | Ht 59.0 in | Wt 136.7 lb

## 2016-06-26 DIAGNOSIS — J8410 Pulmonary fibrosis, unspecified: Secondary | ICD-10-CM

## 2016-06-26 NOTE — Progress Notes (Signed)
[name] [name] 66 y.o. female Pulmonary Rehab Orientation Note Patient arrived today in Cardiac and Pulmonary Rehab for orientation to Pulmonary Rehab. She was able to ambulate from [facility] with minimal difficulty. She has not been prescribed oxygen for home or portable use. Color good, skin warm and dry. Patient is oriented to time and place. Patient's medical history, psychosocial health, and medications reviewed. Psychosocial assessment reveals pt lives with their spouse. Pt is currently out of work on disability. 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 Pt reports her stress level is low. Areas of stress/anxiety include Health.  Pt does not exhibit signs of depression. PHQ2/9 score 0/na. Pt shows good  coping skills with positive outlook. She is offered emotional support and reassurance. Will continue to monitor and evaluate progress toward psychosocial goal(s) of remaining optimistic regarding her pulmonary disease and its progression. She has an appointment with the transplant team at [facility] next month to discuss her options for lung transplant. Physical assessment reveals heart rate is normal, breath sounds clear to auscultation, no wheezes or rhonchi in the upper lobes. Fine crackles note bilat to lower lungs. Grip strength equal, strong. Distal pulses faint. Moderate non-pitting edema noted to lower legs and ankles. Patient reports she does take medications as prescribed. Patient states she follows a Regular diet. The patient reports no specific efforts to gain or lose weight. Patient's weight will be monitored closely. Demonstration and practice of PLB using pulse oximeter. Patient able to return demonstration satisfactorily. Safety and hand hygiene in the exercise area reviewed with patient. Patient voices understanding of the information reviewed. Department  expectations discussed with patient and achievable goals were set. The patient shows enthusiasm about attending the program and we look forward to working with this nice lady. The patient is scheduled for a 6 min walk test on Tuesday 4/17 and to begin exercise on Tuesday 4/24 at 1:30.   45 minutes was spent on a variety of activities such as assessment of the patient, obtaining baseline data including height, weight, BMI, and grip strength, verifying medical history, allergies, and current medications, and teaching patient strategies for performing tasks with less respiratory effort with emphasis on pursed lip breathing.

## 2016-06-27 ENCOUNTER — Encounter (HOSPITAL_COMMUNITY)
Admission: RE | Admit: 2016-06-27 | Discharge: 2016-06-27 | Disposition: A | Discharge status: Home / Self Care | Payer: Medicare Other | Source: Ambulatory Visit | Attending: Pulmonary Disease | Admitting: Pulmonary Disease

## 2016-06-27 DIAGNOSIS — J8410 Pulmonary fibrosis, unspecified: Secondary | ICD-10-CM

## 2016-06-27 NOTE — Progress Notes (Signed)
Pulmonary Individual Treatment Plan  Patient Details  Name: KATRINNA TRAVIESO MRN: [id number] Date of Birth: [birth year] Referring Provider:     Pulmonary Rehab Walk Test from 06/27/2016 in [place]  Referring Provider  Dr. Nelda Marseille      Initial Encounter Date:    Pulmonary Rehab Walk Test from 06/27/2016 in [place]  Date  06/27/16  Referring Provider  Dr. Nelda Marseille      Visit Diagnosis: Pulmonary fibrosis ([place])  Patient's Home Medications on Admission:   Current Outpatient Prescriptions:  .  albuterol (PROVENTIL HFA;VENTOLIN HFA) 108 (90 Base) MCG/ACT inhaler, INHALE 2 PUFFS BY MOUTH AS DIRECTED AS NEEDED FOR SHORTNESS OF BREATH, Disp: , Rfl:  .  amitriptyline (ELAVIL) 50 MG tablet, Take 50 mg by mouth at bedtime.  , Disp: , Rfl:  .  aspirin EC 81 MG tablet, Take 1 tablet (81 mg total) by mouth daily., Disp: 90 tablet, Rfl: 3 .  budesonide-formoterol (SYMBICORT) 160-4.5 MCG/ACT inhaler, Inhale 2 puffs into the lungs 2 (two) times daily as needed. , Disp: , Rfl:  .  clobetasol cream (TEMOVATE) 0.97 %, Apply 1 application topically 2 (two) times daily as needed. APPLY TO AFFECTED FINGER 1 TO 2 TIMES D WITH AT LEAST 1 APPLICATION UNDER OCCLUSION, Disp: , Rfl:  .  conjugated estrogens (PREMARIN) vaginal cream, Place 3.53 Applicatorfuls vaginally 3 (three) times a week. Pt only to use 1/4 of the applicator, Disp: , Rfl:  .  docusate sodium (COLACE) 100 MG capsule, Take 100 mg by mouth daily as needed for mild constipation. , Disp: , Rfl:  .  emtricitabine-tenofovir (TRUVADA) 200-300 MG tablet, TAKE 1 TABLET BY MOUTH EVERY DAY, Disp: , Rfl:  .  isosorbide mononitrate (IMDUR) 30 MG 24 hr tablet, Take 1 tablet (30 mg total) by mouth daily., Disp: 30 tablet, Rfl: 12 .  nitroGLYCERIN (NITROSTAT) 0.4 MG SL tablet, Place 1 tablet (0.4 mg total) under the tongue every 5 (five) minutes as needed for chest pain., Disp: 30 tablet, Rfl: 12 .   omeprazole (PRILOSEC) 40 MG capsule, Take 40 mg by mouth every morning. , Disp: , Rfl:  .  Pirfenidone (ESBRIET) 267 MG CAPS, Take 2 capsules by mouth 2 (two) times daily., Disp: , Rfl:  .  polyethylene glycol (MIRALAX / GLYCOLAX) packet, Take 17 g by mouth daily as needed. , Disp: , Rfl:  .  pravastatin (PRAVACHOL) 80 MG tablet, Take 1 tablet by mouth daily., Disp: , Rfl:  .  raltegravir (ISENTRESS) 400 MG tablet, TAKE 1 TABLET BY MOUTH TWICE DAILY, Disp: , Rfl:  .  senna-docusate (SENOKOT-S) 8.6-50 MG tablet, Take 1 tablet by mouth daily as needed for mild constipation. , Disp: , Rfl:  .  sucralfate (CARAFATE) 1 g tablet, Take 1 g by mouth 2 (two) times daily. , Disp: , Rfl:   Past Medical History: Past Medical History:  Diagnosis Date  . Asthma   . Bradycardia    Sinus bradycardia  . CAD (coronary artery disease)    Minimal by history, Question coronary artery spasm  . Colon polyps   . Dyslipidemia   . Ejection fraction   . GERD (gastroesophageal reflux disease)    Esophageal stricture  . H/O: hysterectomy   . HIV positive ([place])    Needle stick 1993  . Hyperlipidemia   . Palpitations   . Peptic ulcer disease   . Shortness of breath    Extensive evaluation Dr. Winona Legato,  [place], May, 6599, etiology uncertain, probable bronchiolitis possibility of lung biopsy at that time  . Weight loss     Tobacco Use: History  Smoking Status  . Former Smoker  . Quit date: 03/13/1996  Smokeless Tobacco  . Never Used    Labs: Recent Review Flowsheet Data    Labs for ITP Cardiac and Pulmonary Rehab Latest Ref Rng & Units 03/21/2016 03/21/2016   PHART 7.350 - 7.450 - 7.336(L)   PCO2ART 32.0 - 48.0 mmHg - 36.5   HCO3 20.0 - 28.0 mmol/L 19.1(L) 19.5(L)   TCO2 0 - 100 mmol/L 20 21   ACIDBASEDEF 0.0 - 2.0 mmol/L 7.0(H) 6.0(H)   O2SAT % 73.0 99.0      Capillary Blood Glucose: Lab Results  Component Value Date   GLUCAP 87 04/10/2014     ADL UCSD:   Pulmonary Function Assessment:      Pulmonary Function Assessment - 06/26/16 0924      Breath   Bilateral Breath Sounds Other   Other fine crackles throughout both bases   Shortness of Breath Yes;Limiting activity      Exercise Target Goals: Date: 06/27/16  Exercise Program Goal: Individual exercise prescription set with THRR, safety & activity barriers. Participant demonstrates ability to understand and report RPE using BORG scale, to self-measure pulse accurately, and to acknowledge the importance of the exercise prescription.  Exercise Prescription Goal: Starting with aerobic activity 30 plus minutes a day, 3 days per week for initial exercise prescription. Provide home exercise prescription and guidelines that participant acknowledges understanding prior to discharge.  Activity Barriers & Risk Stratification:     Activity Barriers & Cardiac Risk Stratification - 06/26/16 0910      Activity Barriers & Cardiac Risk Stratification   Activity Barriers Shortness of Breath;Balance Concerns;Deconditioning      6 Minute Walk:     6 Minute Walk    Row Name 02/15/16 1609 06/27/16 1632       6 Minute Walk   Phase Initial Initial    Distance 1224 feet 1320 feet    Walk Time 6 minutes 6 minutes    # of Rest Breaks 0 0    MPH 2.31 2.5    METS 2.76 2.91    RPE 13 12    Perceived Dyspnea  3 3    Symptoms No No    Resting HR 67 bpm 84 bpm    Resting BP 112/70 95/66    Max Ex. HR 85 bpm 98 bpm    Max Ex. BP 118/68 104/65      Interval HR   Baseline HR 67 84    1 Minute HR 81 87    2 Minute HR 80 98    3 Minute HR 85 96    4 Minute HR 84 95    5 Minute HR 86 92    6 Minute HR 85 93    2 Minute Post HR 80 85    Interval Heart Rate? Yes Yes      Interval Oxygen   Interval Oxygen? Yes Yes    Baseline Oxygen Saturation % 99 % 94 %    Baseline Liters of Oxygen 0 L 0 L    1 Minute Oxygen Saturation % 99 % 94 %    1 Minute Liters of Oxygen 0 L 0 L    2 Minute Oxygen Saturation % 90 % 94 %    2 Minute  Liters of Oxygen 0 L 0 L  3 Minute Oxygen Saturation % 94 % 94 %    3 Minute Liters of Oxygen 0 L 0 L    4 Minute Oxygen Saturation % 93 % 93 %    4 Minute Liters of Oxygen 0 L 0 L    5 Minute Oxygen Saturation % 99 % 94 %    5 Minute Liters of Oxygen 0 L 0 L    6 Minute Oxygen Saturation % 93 % 95 %    6 Minute Liters of Oxygen 0 L 0 L    2 Minute Post Oxygen Saturation % 100 % 97 %    2 Minute Post Liters of Oxygen 0 L 0 L       Oxygen Initial Assessment:     Oxygen Initial Assessment - 06/26/16 0910      Home Oxygen   Home Oxygen Device None   Sleep Oxygen Prescription None   Home Exercise Oxygen Prescription None   Home at Rest Exercise Oxygen Prescription None      Oxygen Re-Evaluation:   Oxygen Discharge (Final Oxygen Re-Evaluation):   Initial Exercise Prescription:     Initial Exercise Prescription - 06/27/16 1600      Date of Initial Exercise RX and Referring Provider   Date 06/27/16   Referring Provider Dr. Nelda Marseille     Bike   Level 0.5   Minutes 17     NuStep   Level 3   Minutes 17   METs 1.7     Track   Laps 10   Minutes 17     Prescription Details   Frequency (times per week) 2   Duration Progress to 45 minutes of aerobic exercise without signs/symptoms of physical distress     Intensity   THRR 40-80% of Max Heartrate 62-125   Ratings of Perceived Exertion 11-13   Perceived Dyspnea 0-4     Progression   Progression Continue progressive overload as per policy without signs/symptoms or physical distress.     Resistance Training   Training Prescription Yes   Weight orange bands   Reps --  10 minutes of strength training      Perform Capillary Blood Glucose checks as needed.  Exercise Prescription Changes:     Exercise Prescription Changes    Row Name 02/22/16 1500 02/24/16 1500 02/29/16 1600 03/02/16 1500 03/07/16 1500     Response to Exercise   Blood Pressure (Admit) 110/62 112/60 96/58 128/70 108/62   Blood Pressure  (Exercise) 104/60 130/64 1_0   Blood Pressure (Exit) 98/60 1_1 92/54   Heart Rate (Admit) 77 bpm 76 bpm 79 bpm 82 bpm 67 bpm   Heart Rate (Exercise) 96 bpm 92 bpm 102 bpm 81 bpm 87 bpm   Heart Rate (Exit) 68 bpm 68 bpm 74 bpm 66 bpm 71 bpm   Oxygen Saturation (Admit) 95 % 99 % 99 % 98 % 99 %   Oxygen Saturation (Exercise) 96 % 98 % 95 % 98 % 98 %   Oxygen Saturation (Exit) 93 % 92 % 98 % 97 % 98 %   Rating of Perceived Exertion (Exercise) _2 Perceived Dyspnea (Exercise) _3 Duration Progress to 45 minutes of aerobic exercise without signs/symptoms of physical distress Progress to 45 minutes of aerobic exercise without signs/symptoms of physical distress Progress to 45 minutes of aerobic exercise without signs/symptoms of physical distress Progress to 45 minutes of aerobic exercise  without signs/symptoms of physical distress Progress to 45 minutes of aerobic exercise without signs/symptoms of physical distress   Intensity -  40-80% HRR -  40-80% HRR -  40-80% HRR -  40-80% HRR THRR unchanged     Progression   Progression Continue to progress workloads to maintain intensity without signs/symptoms of physical distress. Continue to progress workloads to maintain intensity without signs/symptoms of physical distress. Continue to progress workloads to maintain intensity without signs/symptoms of physical distress. Continue to progress workloads to maintain intensity without signs/symptoms of physical distress. Continue to progress workloads to maintain intensity without signs/symptoms of physical distress.     Resistance Training   Training Prescription _0    Weight _1    Reps 10-12  10 minutes of strength training 10-12  10 minutes of strength training 10-12  10 minutes of strength training 10-12  10 minutes of strength training 10-12  10 minutes of strength training      Interval Training   Interval Training _2      NuStep   Level 1  - _3 Minutes 17  - _4 METs 2.2  - 1.9 2.2 1.9     Rower   Level _5 Minutes _6 Track   Laps _7 - 14   Minutes _8 - 17     Exercise Review   Progression  -  - Yes  -  -   Row Name 03/14/16 1500 03/16/16 1500 03/28/16 1531 04/04/16 1600 04/06/16 1600     Response to Exercise   Blood Pressure (Admit) 110/68 112/60 102/60 102/66 112/64   Blood Pressure (Exercise) 100/60 96/64 100/62 110/50 100/58   Blood Pressure (Exit) 96/60 100/64 98/60 100/56 104/64   Heart Rate (Admit) 76 bpm 68 bpm 81 bpm 92 bpm 72 bpm   Heart Rate (Exercise) 94 bpm 84 bpm 98 bpm 102 bpm 79 bpm   Heart Rate (Exit) 72 bpm 78 bpm 88 bpm 75 bpm 70 bpm   Oxygen Saturation (Admit) 98 % 100 % 96 % 95 % 96 %   Oxygen Saturation (Exercise) 98 % 97 % 94 % 93 % 96 %   Oxygen Saturation (Exit) 95 % 98 % 97 % 96 % 98 %   Rating of Perceived Exertion (Exercise) _9 Perceived Dyspnea (Exercise) _10 Duration Progress to 45 minutes of aerobic exercise without signs/symptoms of physical distress Progress to 45 minutes of aerobic exercise without signs/symptoms of physical distress Progress to 45 minutes of aerobic exercise without signs/symptoms of physical distress Progress to 45 minutes of aerobic exercise without signs/symptoms of physical distress Progress to 45 minutes of aerobic exercise without signs/symptoms of physical distress   Intensity _11      Progression   Progression Continue to progress workloads to maintain intensity without signs/symptoms of physical distress. Continue to progress workloads to maintain intensity without signs/symptoms of physical distress. Continue to progress workloads to maintain intensity without signs/symptoms of physical distress. Continue to progress workloads to  maintain intensity without signs/symptoms of physical distress. Continue to progress workloads to maintain intensity without signs/symptoms of physical distress.     Horticulturist, commercial Prescription  _0    Weight _1    Reps 10-12  10 minutes of strength training 10-12  10 minutes of strength training 10-12  10 minutes of strength training 10-12  10 minutes of strength training 10-12  10 minutes of strength training     Interval Training   Interval Training _2      NuStep   Level 2  - 4 4  -   Minutes 17  - 17 17  -   METs 1.8  - 1.7  -  -     Rower   Level _3 Minutes _4 Track   Laps _5 Minutes _6 Row Name 04/11/16 1500 04/13/16 1500           Response to Exercise   Blood Pressure (Admit) 104/60 92/50      Blood Pressure (Exercise) 94/50 122/70      Blood Pressure (Exit) 98/64 100/60      Heart Rate (Admit) 72 bpm 69 bpm      Heart Rate (Exercise) 99 bpm 76 bpm      Heart Rate (Exit) 68 bpm 70 bpm      Oxygen Saturation (Admit) 97 % 99 %      Oxygen Saturation (Exercise) 96 % 98 %      Oxygen Saturation (Exit) 96 % 70 %      Rating of Perceived Exertion (Exercise) 15 15      Perceived Dyspnea (Exercise) 3 3      Duration Progress to 45 minutes of aerobic exercise without signs/symptoms of physical distress Progress to 45 minutes of aerobic exercise without signs/symptoms of physical distress      Intensity THRR unchanged THRR unchanged        Progression   Progression Continue to progress workloads to maintain intensity without signs/symptoms of physical distress. Continue to progress workloads to maintain intensity without signs/symptoms of physical distress.        Resistance Training   Training Prescription Yes Yes      Weight orange bands orange bands      Reps 10-12  10 minutes of strength training 10-12  10  minutes of strength training        Interval Training   Interval Training  - No        NuStep   Level 5 5      Minutes 17 17      METs  - 1.7        Rower   Level 2 2      Minutes 17 17        Track   Laps 13  -      Minutes 17  -        Exercise Review   Progression Yes  -         Exercise Comments:     Exercise Comments    Row Name 03/02/16 1002 04/04/16 0826 06/26/16 0911       Exercise Comments Patient has only attended three exercise sessions. Will cont. to monitor.  Patient walkes up to 16 laps in 15 minutes. MET level places her at a low level. Rates her level of exertion at a low level. Will cont. to monitor and progress.  patient hopes to  be able to meet [facility] exercise requirements needed for transplant        Exercise Goals and Review:     Exercise Goals    Row Name 06/26/16 0911             Exercise Goals   Increase Physical Activity Yes       Intervention Provide advice, education, support and counseling about physical activity/exercise needs.;Develop an individualized exercise prescription for aerobic and resistive training based on initial evaluation findings, risk stratification, comorbidities and participant's personal goals.       Expected Outcomes Achievement of increased cardiorespiratory fitness and enhanced flexibility, muscular endurance and strength shown through measurements of functional capacity and personal statement of participant.       Increase Strength and Stamina Yes       Intervention Provide advice, education, support and counseling about physical activity/exercise needs.;Develop an individualized exercise prescription for aerobic and resistive training based on initial evaluation findings, risk stratification, comorbidities and participant's personal goals.       Expected Outcomes Achievement of increased cardiorespiratory fitness and enhanced flexibility, muscular endurance and strength shown through measurements of functional  capacity and personal statement of participant.          Exercise Goals Re-Evaluation :   Discharge Exercise Prescription (Final Exercise Prescription Changes):     Exercise Prescription Changes - 04/13/16 1500      Response to Exercise   Blood Pressure (Admit) 92/50   Blood Pressure (Exercise) 122/70   Blood Pressure (Exit) 100/60   Heart Rate (Admit) 69 bpm   Heart Rate (Exercise) 76 bpm   Heart Rate (Exit) 70 bpm   Oxygen Saturation (Admit) 99 %   Oxygen Saturation (Exercise) 98 %   Oxygen Saturation (Exit) 70 %   Rating of Perceived Exertion (Exercise) 15   Perceived Dyspnea (Exercise) 3   Duration Progress to 45 minutes of aerobic exercise without signs/symptoms of physical distress   Intensity THRR unchanged     Progression   Progression Continue to progress workloads to maintain intensity without signs/symptoms of physical distress.     Resistance Training   Training Prescription Yes   Weight orange bands   Reps 10-12  10 minutes of strength training     Interval Training   Interval Training No     NuStep   Level 5   Minutes 17   METs 1.7     Rower   Level 2   Minutes 17      Nutrition:  Target Goals: Understanding of nutrition guidelines, daily intake of sodium <1570m, cholesterol <2020m calories 30% from fat and 7% or less from saturated fats, daily to have 5 or more servings of fruits and vegetables.  Biometrics:     Pre Biometrics - 06/26/16 0912      Pre Biometrics   Grip Strength 20 kg       Nutrition Therapy Plan and Nutrition Goals:     Nutrition Therapy & Goals - 04/13/16 1512      Nutrition Therapy   Diet Therapeutic Lifestyle Changes     Personal Nutrition Goals   Nutrition Goal Pt to maintain her UBW around 128-135 lb   Personal Goal #2 Decrease consumption of simple carbs (e.g. Coke, sweet tea, desserts)     Intervention Plan   Intervention Prescribe, educate and counsel regarding individualized specific dietary  modifications aiming towards targeted core components such as weight, hypertension, lipid management, diabetes, heart failure and other comorbidities.   Expected  Outcomes Short Term Goal: Understand basic principles of dietary content, such as calories, fat, sodium, cholesterol and nutrients.;Long Term Goal: Adherence to prescribed nutrition plan.      Nutrition Discharge: Rate Your Plate Scores:     Nutrition Assessments - 04/13/16 1510      Rate Your Plate Scores   Pre Score 38      Nutrition Goals Re-Evaluation:     Nutrition Goals Re-Evaluation    Row Name 05/10/16 1331             Goals   Current Weight 130 lb 15.3 oz (59.4 kg)       Nutrition Goal Pt to maintain her UBW around 128-135 lb       Comment Wt goal met.  No new information available to assess simple carb intake         Personal Goal #2 Re-Evaluation   Personal Goal #2 Decrease consumption of simple carbs (e.g. Coke, sweet tea, desserts)          Nutrition Goals Discharge (Final Nutrition Goals Re-Evaluation):     Nutrition Goals Re-Evaluation - 05/10/16 1331      Goals   Current Weight 130 lb 15.3 oz (59.4 kg)   Nutrition Goal Pt to maintain her UBW around 128-135 lb   Comment Wt goal met.  No new information available to assess simple carb intake     Personal Goal #2 Re-Evaluation   Personal Goal #2 Decrease consumption of simple carbs (e.g. Coke, sweet tea, desserts)      Psychosocial: Target Goals: Acknowledge presence or absence of significant depression and/or stress, maximize coping skills, provide positive support system. Participant is able to verbalize types and ability to use techniques and skills needed for reducing stress and depression.  Initial Review & Psychosocial Screening:     Initial Psych Review & Screening - 06/26/16 0929      Initial Review   Current issues with None Identified     Family Dynamics   Good Support System? Yes     Barriers   Psychosocial barriers to  participate in program There are no identifiable barriers or psychosocial needs.;The patient should benefit from training in stress management and relaxation.     Screening Interventions   Interventions Encouraged to exercise      Quality of Life Scores:   PHQ-9: Recent Review Flowsheet Data    Depression screen [facility] 2/9 06/26/2016 02/07/2016   Decreased Interest 0 1   Down, Depressed, Hopeless 0 0   PHQ - 2 Score 0 1     Interpretation of Total Score  Total Score Depression Severity:  1-4 = Minimal depression, 5-9 = Mild depression, 10-14 = Moderate depression, 15-19 = Moderately severe depression, 20-27 = Severe depression   Psychosocial Evaluation and Intervention:     Psychosocial Evaluation - 06/26/16 0930      Psychosocial Evaluation & Interventions   Interventions Encouraged to exercise with the program and follow exercise prescription   Continue Psychosocial Services  No Follow up required      Psychosocial Re-Evaluation:     Psychosocial Re-Evaluation    [place] Name 03/02/16 1111 04/03/16 1441           Psychosocial Re-Evaluation   Comments No psychosocial issues identified No psychosocial issues identified      Interventions Encouraged to attend Pulmonary Rehabilitation for the exercise Encouraged to attend Pulmonary Rehabilitation for the exercise      Continue Psychosocial Services  No No  Psychosocial Discharge (Final Psychosocial Re-Evaluation):     Psychosocial Re-Evaluation - 04/03/16 1441      Psychosocial Re-Evaluation   Comments No psychosocial issues identified   Interventions Encouraged to attend Pulmonary Rehabilitation for the exercise   Continue Psychosocial Services  No      Education: Education Goals: Education classes will be provided on a weekly basis, covering required topics. Participant will state understanding/return demonstration of topics presented.  Learning Barriers/Preferences:     Learning Barriers/Preferences  - 06/26/16 0924      Learning Barriers/Preferences   Learning Barriers None   Learning Preferences Group Instruction;Written Material;Individual Instruction      Education Topics: Risk Factor Reduction:  -Group instruction that is supported by a PowerPoint presentation. Instructor discusses the definition of a risk factor, different risk factors for pulmonary disease, and how the heart and lungs work together.     Nutrition for Pulmonary Patient:  -Group instruction provided by PowerPoint slides, verbal discussion, and written materials to support subject matter. The instructor gives an explanation and review of healthy diet recommendations, which includes a discussion on weight management, recommendations for fruit and vegetable consumption, as well as protein, fluid, caffeine, fiber, sodium, sugar, and alcohol. Tips for eating when patients are short of breath are discussed.   Pursed Lip Breathing:  -Group instruction that is supported by demonstration and informational handouts. Instructor discusses the benefits of pursed lip and diaphragmatic breathing and detailed demonstration on how to preform both.     Oxygen Safety:  -Group instruction provided by PowerPoint, verbal discussion, and written material to support subject matter. There is an overview of "What is Oxygen" and "Why do we need it".  Instructor also reviews how to create a safe environment for oxygen use, the importance of using oxygen as prescribed, and the risks of noncompliance. There is a brief discussion on traveling with oxygen and resources the patient may utilize.   Oxygen Equipment:  -Group instruction provided by [facility] Staff utilizing handouts, written materials, and equipment demonstrations.   PULMONARY REHAB OTHER RESPIRATORY from 04/13/2016 in [place]  Date  03/16/16  Educator  [employer]  Instruction Review Code  2- meets goals/outcomes      Signs and Symptoms:   -Group instruction provided by written material and verbal discussion to support subject matter. Warning signs and symptoms of infection, stroke, and heart attack are reviewed and when to call the physician/911 reinforced. Tips for preventing the spread of infection discussed.   PULMONARY REHAB OTHER RESPIRATORY from 04/13/2016 in [place]  Date  04/13/16  Educator  [facility]  Instruction Review Code  2- meets goals/outcomes      Advanced Directives:  -Group instruction provided by verbal instruction and written material to support subject matter. Instructor reviews Advanced Directive laws and proper instruction for filling out document.   Pulmonary Video:  -Group video education that reviews the importance of medication and oxygen compliance, exercise, good nutrition, pulmonary hygiene, and pursed lip and diaphragmatic breathing for the pulmonary patient.   Exercise for the Pulmonary Patient:  -Group instruction that is supported by a PowerPoint presentation. Instructor discusses benefits of exercise, core components of exercise, frequency, duration, and intensity of an exercise routine, importance of utilizing pulse oximetry during exercise, safety while exercising, and options of places to exercise outside of rehab.     Pulmonary Medications:  -Verbally interactive group education provided by instructor with focus on inhaled medications and proper administration.  PULMONARY REHAB OTHER RESPIRATORY from 04/13/2016 in [place]  Date  04/06/16  Educator  [facility]arm  Instruction Review Code  2- meets goals/outcomes      Anatomy and Physiology of the Respiratory System and Intimacy:  -Group instruction provided by PowerPoint, verbal discussion, and written material to support subject matter. Instructor reviews respiratory cycle and anatomical components of the respiratory system and their functions. Instructor also reviews differences  in obstructive and restrictive respiratory diseases with examples of each. Intimacy, Sex, and Sexuality differences are reviewed with a discussion on how relationships can change when diagnosed with pulmonary disease. Common sexual concerns are reviewed.   PULMONARY REHAB OTHER RESPIRATORY from 04/13/2016 in [place]  Date  02/24/16  Educator  rn  Instruction Review Code  2- meets goals/outcomes      Knowledge Questionnaire Score:   Core Components/Risk Factors/Patient Goals at Admission:     Personal Goals and Risk Factors at Admission - 06/26/16 0927      Core Components/Risk Factors/Patient Goals on Admission    Weight Management Yes;Weight Loss   Intervention Weight Management: Develop a combined nutrition and exercise program designed to reach desired caloric intake, while maintaining appropriate intake of nutrient and fiber, sodium and fats, and appropriate energy expenditure required for the weight goal.;Weight Management: Provide education and appropriate resources to help participant work on and attain dietary goals.   Expected Outcomes Short Term: Continue to assess and modify interventions until short term weight is achieved   Improve shortness of breath with ADL's Yes   Intervention Provide education, individualized exercise plan and daily activity instruction to help decrease symptoms of SOB with activities of daily living.   Expected Outcomes Short Term: Achieves a reduction of symptoms when performing activities of daily living.   Heart Failure Yes   Intervention Provide a combined exercise and nutrition program that is supplemented with education, support and counseling about heart failure. Directed toward relieving symptoms such as shortness of breath, decreased exercise tolerance, and extremity edema.   Expected Outcomes Improve functional capacity of life;Short term: Attendance in program 2-3 days a week with increased exercise capacity.  Reported lower sodium intake. Reported increased fruit and vegetable intake. Reports medication compliance.;Short term: Daily weights obtained and reported for increase. Utilizing diuretic protocols set by physician.;Long term: Adoption of self-care skills and reduction of barriers for early signs and symptoms recognition and intervention leading to self-care maintenance.      Core Components/Risk Factors/Patient Goals Review:      Goals and Risk Factor Review    Row Name 03/02/16 1109 04/03/16 1439           Core Components/Risk Factors/Patient Goals Review   Personal Goals Review Develop more efficient breathing techniques such as purse lipped breathing and diaphragmatic breathing and practicing self-pacing with activity.;Improve shortness of breath with ADL's Develop more efficient breathing techniques such as purse lipped breathing and diaphragmatic breathing and practicing self-pacing with activity.;Improve shortness of breath with ADL's      Review has attended 3 exercise sessions, too early to see improvement absent for 1 week d/t cardiac catheterization with minimal CAD results, slowly progressing      Expected Outcomes expect to see progress in the next 30 days will continue to gain strength and stamina as workloads increase         Core Components/Risk Factors/Patient Goals at Discharge (Final Review):      Goals and Risk Factor Review - 04/03/16  1439      Core Components/Risk Factors/Patient Goals Review   Personal Goals Review Develop more efficient breathing techniques such as purse lipped breathing and diaphragmatic breathing and practicing self-pacing with activity.;Improve shortness of breath with ADL's   Review absent for 1 week d/t cardiac catheterization with minimal CAD results, slowly progressing   Expected Outcomes will continue to gain strength and stamina as workloads increase      ITP Comments:   Comments:

## 2016-07-04 ENCOUNTER — Encounter (HOSPITAL_COMMUNITY)
Admission: RE | Admit: 2016-07-04 | Discharge: 2016-07-04 | Disposition: A | Discharge status: Home / Self Care | Payer: Medicare Other | Source: Ambulatory Visit | Attending: Pulmonary Disease | Admitting: Pulmonary Disease

## 2016-07-04 VITALS — Wt 139.1 lb

## 2016-07-04 DIAGNOSIS — J8410 Pulmonary fibrosis, unspecified: Secondary | ICD-10-CM

## 2016-07-04 NOTE — Progress Notes (Signed)
Daily Session Note  Patient Details  Name: [name] [name] MRN: [id number] Date of Birth: [birth year] Referring Provider:     Pulmonary Rehab Walk Test from 06/27/2016 in [place]  Referring Provider  Dr. Nelda Marseille      Encounter Date: 07/04/2016  Check In:     Session Check In - 07/04/16 1325      Check-In   Location [facility]   Staff Present Su Hilt, MS, ACSM RCEP, Exercise Physiologist;Portia Rollene Rotunda, RN, BSN;Lisa Ysidro Evert, RN;Emitt Maglione Leonia Reeves, RN, BSN   Supervising physician immediately available to respond to emergencies [facility] immediately available   Physician(s) Dr. Candiss Norse   Medication changes reported     No   Fall or balance concerns reported    No   Comments mild intermittant balance problem. patient has cane available to use when she feels unsteady   Tobacco Cessation No Change   Warm-up and Cool-down Performed as group-led instruction   Resistance Training Performed Yes   VAD Patient? No     Pain Assessment   Currently in Pain? No/denies   Multiple Pain Sites No      Capillary Blood Glucose: No results found for this or any previous visit (from the past 24 hour(s)).      Exercise Prescription Changes - 07/04/16 1500      Response to Exercise   Blood Pressure (Admit) 100/60   Blood Pressure (Exercise) 120/70   Blood Pressure (Exit) 104/60   Heart Rate (Admit) 62 bpm   Heart Rate (Exercise) 102 bpm   Heart Rate (Exit) 71 bpm   Oxygen Saturation (Admit) 98 %   Oxygen Saturation (Exercise) 96 %   Oxygen Saturation (Exit) 95 %   Rating of Perceived Exertion (Exercise) 12   Perceived Dyspnea (Exercise) 1   Duration Progress to 45 minutes of aerobic exercise without signs/symptoms of physical distress   Intensity THRR unchanged     Progression   Progression Continue to progress workloads to maintain intensity without signs/symptoms of physical distress.     Resistance Training   Training  Prescription Yes   Weight orange bands   Reps 10-15   Time --  10 minutes     Interval Training   Interval Training No     Bike   Level 0.5   Minutes 17     NuStep   Level 3   Minutes 17     Track   Laps 16   Minutes 17      History  Smoking Status  . Former Smoker  . Quit date: 03/13/1996  Smokeless Tobacco  . Never Used    Goals Met:  Exercise tolerated well Strength training completed today  Goals Unmet:  Not Applicable  Comments: Service time is from 1330 to 1500    Dr. Rush Farmer is Medical Director for Pulmonary Rehab at [facility].

## 2016-07-06 ENCOUNTER — Encounter (HOSPITAL_COMMUNITY)
Admission: RE | Admit: 2016-07-06 | Discharge: 2016-07-06 | Disposition: A | Discharge status: Home / Self Care | Payer: Medicare Other | Source: Ambulatory Visit | Attending: Pulmonary Disease | Admitting: Pulmonary Disease

## 2016-07-06 VITALS — Wt 138.7 lb

## 2016-07-06 DIAGNOSIS — J8410 Pulmonary fibrosis, unspecified: Secondary | ICD-10-CM

## 2016-07-06 NOTE — Progress Notes (Signed)
Daily Session Note  Patient Details  Name: [name] [name] MRN: [id number] Date of Birth: December 05, 1950 Referring Provider:     Pulmonary Rehab Walk Test from 06/27/2016 in [place]  Referring Provider  Dr. Nelda Marseille      Encounter Date: 07/06/2016  Check In:     Session Check In - 07/06/16 1330      Check-In   [facility]   Staff Present Rosebud Poles, RN, BSN;Molly diVincenzo, MS, ACSM RCEP, Exercise Physiologist;Lisa Ysidro Evert, RN;Portia Rollene Rotunda, RN, BSN   Supervising physician immediately available to respond to emergencies [facility] immediately available   Physician(s) Dr. Posey Pronto   Medication changes reported     No   Fall or balance concerns reported    No   Tobacco Cessation No Change   Warm-up and Cool-down Performed as group-led instruction   Resistance Training Performed Yes   VAD Patient? No     Pain Assessment   Currently in Pain? No/denies   Multiple Pain Sites No      Capillary Blood Glucose: No results found for this or any previous visit (from the past 24 hour(s)).      Exercise Prescription Changes - 07/06/16 1600      Response to Exercise   Blood Pressure (Admit) 100/64   Blood Pressure (Exercise) 116/60   Blood Pressure (Exit) 100/60   Heart Rate (Admit) 74 bpm   Heart Rate (Exercise) 107 bpm   Heart Rate (Exit) 83 bpm   Oxygen Saturation (Admit) 96 %   Oxygen Saturation (Exercise) 95 %   Oxygen Saturation (Exit) 97 %   Rating of Perceived Exertion (Exercise) 15   Perceived Dyspnea (Exercise) 2   Duration Progress to 45 minutes of aerobic exercise without signs/symptoms of physical distress   Intensity THRR unchanged     Progression   Progression Continue to progress workloads to maintain intensity without signs/symptoms of physical distress.     Resistance Training   Training Prescription Yes   Weight orange bands   Reps 10-15   Time --  10 minutes     Interval Training   Interval  Training No     Bike   Level 0.5   Minutes 17     Track   Laps 12   Minutes 17      History  Smoking Status  . Former Smoker  . Quit date: 03/13/1996  Smokeless Tobacco  . Never Used    Goals Met:  Exercise tolerated well Strength training completed today  Goals Unmet:  Not Applicable  Comments: Service time is from 1330 to [place]    Dr. Rush Farmer is Medical Director for Pulmonary Rehab at [facility].

## 2016-07-11 ENCOUNTER — Encounter (HOSPITAL_COMMUNITY)
Admission: RE | Admit: 2016-07-11 | Discharge: 2016-07-11 | Disposition: A | Discharge status: Home / Self Care | Payer: Medicare Other | Source: Ambulatory Visit | Attending: Pulmonary Disease | Admitting: Pulmonary Disease

## 2016-07-11 VITALS — Wt 140.2 lb

## 2016-07-11 DIAGNOSIS — J8410 Pulmonary fibrosis, unspecified: Secondary | ICD-10-CM | POA: Diagnosis not present

## 2016-07-11 NOTE — Progress Notes (Signed)
Daily Session Note  Patient Details  Name: [name] [name] MRN: [id number] Date of Birth: 1950/05/05 Referring Provider:     Pulmonary Rehab Walk Test from 06/27/2016 in [place]  Referring Provider  Dr. Nelda Marseille      Encounter Date: 07/11/2016  Check In:     Session Check In - 07/11/16 1610      Check-In   [facility]   Staff Present Trish Fountain, RN, BSN;Molly diVincenzo, MS, ACSM RCEP, Exercise Physiologist;Jayshaun Phillips Ysidro Evert, RN   Supervising physician immediately available to respond to emergencies [facility] immediately available   Physician(s) Dr. Posey Pronto   Medication changes reported     No   Fall or balance concerns reported    No   Tobacco Cessation No Change   Warm-up and Cool-down Performed as group-led instruction   Resistance Training Performed Yes   VAD Patient? No     Pain Assessment   Currently in Pain? No/denies   Multiple Pain Sites No      Capillary Blood Glucose: No results found for this or any previous visit (from the past 24 hour(s)).      Exercise Prescription Changes - 07/11/16 1600      Response to Exercise   Blood Pressure (Admit) 118/66   Blood Pressure (Exercise) 102/68   Blood Pressure (Exit) 102/62   Heart Rate (Admit) 90 bpm   Heart Rate (Exercise) 118 bpm   Heart Rate (Exit) 89 bpm   Oxygen Saturation (Admit) 95 %   Oxygen Saturation (Exercise) 94 %   Oxygen Saturation (Exit) 95 %   Rating of Perceived Exertion (Exercise) 13   Perceived Dyspnea (Exercise) 3   Duration Progress to 45 minutes of aerobic exercise without signs/symptoms of physical distress   Intensity THRR unchanged     Progression   Progression Continue to progress workloads to maintain intensity without signs/symptoms of physical distress.     Resistance Training   Training Prescription Yes   Weight orange bands   Reps 10-15   Time 10 Minutes     Interval Training   Interval Training No     Bike   Level 0.5   Minutes 17     NuStep   Level 3   Minutes 17   METs 1.4     Track   Laps 15   Minutes 17      History  Smoking Status  . Former Smoker  . Quit date: 03/13/1996  Smokeless Tobacco  . Never Used    Goals Met:  Exercise tolerated well No report of cardiac concerns or symptoms Strength training completed today  Goals Unmet:  Not Applicable  Comments: Service time is from 1330 to 1500    Dr. Rush Farmer is Medical Director for Pulmonary Rehab at [facility].

## 2016-07-11 NOTE — Progress Notes (Signed)
I have reviewed a Home Exercise Prescription with [name] [name] . [name] [name] is currently exercising at home.  The patient was advised to walk 2-3 days a week for 30-45 minutes.  [name] [name] and I discussed how to progress their exercise prescription.  The patient stated that their goals were to increase strength and energy.  The patient stated that they understand the exercise prescription.  We reviewed exercise guidelines, target heart rate during exercise, oxygen use, weather, home pulse oximeter, endpoints for exercise, and goals.  Patient is encouraged to come to me with any questions. I will continue to follow up with the patient to assist them with progression and safety.

## 2016-07-13 ENCOUNTER — Encounter (HOSPITAL_COMMUNITY)
Admission: RE | Admit: 2016-07-13 | Discharge: 2016-07-13 | Disposition: A | Discharge status: Home / Self Care | Payer: Medicare Other | Source: Ambulatory Visit | Attending: Pulmonary Disease | Admitting: Pulmonary Disease

## 2016-07-13 VITALS — Wt 138.2 lb

## 2016-07-13 DIAGNOSIS — J8410 Pulmonary fibrosis, unspecified: Secondary | ICD-10-CM | POA: Diagnosis not present

## 2016-07-13 NOTE — Progress Notes (Signed)
Daily Session Note  Patient Details  Name: [name] [name] MRN: [id number] Date of Birth: [birth year] Referring Provider:     Pulmonary Rehab Walk Test from 06/27/2016 in [place]  Referring Provider  Dr. Nelda Marseille      Encounter Date: 07/13/2016  Check In:     Session Check In - 07/13/16 1349      Check-In   Location [facility]   Staff Present Rosebud Poles, RN, Luisa Hart, RN, Roque Cash, RN   Supervising physician immediately available to respond to emergencies [facility] immediately available   Physician(s) Dr. Sloan Leiter   Medication changes reported     No   Fall or balance concerns reported    No   Tobacco Cessation No Change   Warm-up and Cool-down Performed as group-led instruction   Resistance Training Performed Yes   VAD Patient? No     Pain Assessment   Currently in Pain? No/denies   Multiple Pain Sites No      Capillary Blood Glucose: No results found for this or any previous visit (from the past 24 hour(s)).      Exercise Prescription Changes - 07/13/16 1600      Response to Exercise   Blood Pressure (Admit) 120/64   Blood Pressure (Exercise) 124/66   Blood Pressure (Exit) 118/70   Heart Rate (Admit) 77 bpm   Heart Rate (Exercise) 75 bpm   Heart Rate (Exit) 69 bpm   Oxygen Saturation (Admit) 98 %   Oxygen Saturation (Exercise) 97 %   Oxygen Saturation (Exit) 98 %   Rating of Perceived Exertion (Exercise) 13   Perceived Dyspnea (Exercise) 3   Duration Progress to 45 minutes of aerobic exercise without signs/symptoms of physical distress   Intensity THRR unchanged     Progression   Progression Continue to progress workloads to maintain intensity without signs/symptoms of physical distress.     Resistance Training   Training Prescription Yes   Weight orange bands   Reps 10-15   Time 10 Minutes     Interval Training   Interval Training No     Bike   Level 0.5   Minutes 17     NuStep   Level 3   Minutes 17   METs 1.9      History  Smoking Status  . Former Smoker  . Quit date: 03/13/1996  Smokeless Tobacco  . Never Used    Goals Met:  Exercise tolerated well Strength training completed today  Goals Unmet:  Not Applicable  Comments: Service time is from 1330 to 1530    Dr. Rush Farmer is Medical Director for Pulmonary Rehab at [facility].

## 2016-07-18 ENCOUNTER — Encounter (HOSPITAL_COMMUNITY)
Admission: RE | Admit: 2016-07-18 | Discharge: 2016-07-18 | Disposition: A | Discharge status: Home / Self Care | Payer: Medicare Other | Source: Ambulatory Visit | Attending: Pulmonary Disease | Admitting: Pulmonary Disease

## 2016-07-18 VITALS — Wt 138.2 lb

## 2016-07-18 DIAGNOSIS — J8410 Pulmonary fibrosis, unspecified: Secondary | ICD-10-CM | POA: Diagnosis not present

## 2016-07-18 NOTE — Progress Notes (Signed)
Daily Session Note  Patient Details  Name: [name] [name] MRN: [id number] Date of Birth: 09-11-1950 Referring Provider:     Pulmonary Rehab Walk Test from 06/27/2016 in [place]  Referring Provider  Dr. Nelda Marseille      Encounter Date: 07/18/2016  Check In:     Session Check In - 07/18/16 1338      Check-In   Location [facility]   Staff Present Trish Fountain, RN, Maxcine Ham, RN, BSN;Molly diVincenzo, MS, ACSM RCEP, Exercise Physiologist;Lisa Ysidro Evert, RN   Supervising physician immediately available to respond to emergencies [facility] immediately available   Physician(s) Dr. Almetta Lovely   Medication changes reported     No   Fall or balance concerns reported    No   Tobacco Cessation No Change   Warm-up and Cool-down Performed as group-led instruction   Resistance Training Performed Yes   VAD Patient? No     Pain Assessment   Currently in Pain? No/denies   Multiple Pain Sites No      Capillary Blood Glucose: No results found for this or any previous visit (from the past 24 hour(s)).      Exercise Prescription Changes - 07/18/16 1528      Response to Exercise   Blood Pressure (Admit) 130/80   Blood Pressure (Exercise) 116/60   Blood Pressure (Exit) 98/60   Heart Rate (Admit) 74 bpm   Heart Rate (Exercise) 97 bpm   Heart Rate (Exit) 77 bpm   Oxygen Saturation (Admit) 98 %   Oxygen Saturation (Exercise) 94 %   Oxygen Saturation (Exit) 94 %   Rating of Perceived Exertion (Exercise) 15   Perceived Dyspnea (Exercise) 3   Duration Progress to 45 minutes of aerobic exercise without signs/symptoms of physical distress   Intensity THRR unchanged     Progression   Progression Continue to progress workloads to maintain intensity without signs/symptoms of physical distress.     Resistance Training   Training Prescription Yes   Weight orange bands   Reps 10-15   Time 10 Minutes     Interval Training   Interval  Training No     Bike   Level 0.5   Minutes 17     NuStep   Level 3   Minutes 17   METs 2.4     Track   Laps 16   Minutes 17      History  Smoking Status  . Former Smoker  . Quit date: 03/13/1996  Smokeless Tobacco  . Never Used    Goals Met:  Improved SOB with ADL's Using PLB without cueing & demonstrates good technique Exercise tolerated well No report of cardiac concerns or symptoms Strength training completed today  Goals Unmet:  Not Applicable  Comments: Service time is from 1330 to 1515   Dr. Rush Farmer is Medical Director for Pulmonary Rehab at [facility].

## 2016-07-18 NOTE — Progress Notes (Signed)
Pulmonary Individual Treatment Plan  Patient Details  Name: [name] [name] MRN: [id number] Date of Birth: [birth year] Referring Provider:     Pulmonary Rehab Walk Test from 06/27/2016 in [facility]  Referring Provider  Dr. Nelda Marseille      Initial Encounter Date:    Pulmonary Rehab Walk Test from 06/27/2016 in [facility]  Date  06/27/16  Referring Provider  Dr. Nelda Marseille      Visit Diagnosis: Pulmonary fibrosis ([place])  Patient's Home Medications on Admission:   Current Outpatient Prescriptions:  .  albuterol (PROVENTIL HFA;VENTOLIN HFA) 108 (90 Base) MCG/ACT inhaler, INHALE 2 PUFFS BY MOUTH AS DIRECTED AS NEEDED FOR SHORTNESS OF BREATH, Disp: , Rfl:  .  amitriptyline (ELAVIL) 50 MG tablet, Take 50 mg by mouth at bedtime.  , Disp: , Rfl:  .  aspirin EC 81 MG tablet, Take 1 tablet (81 mg total) by mouth daily., Disp: 90 tablet, Rfl: 3 .  budesonide-formoterol (SYMBICORT) 160-4.5 MCG/ACT inhaler, Inhale 2 puffs into the lungs 2 (two) times daily as needed. , Disp: , Rfl:  .  clobetasol cream (TEMOVATE) 0.97 %, Apply 1 application topically 2 (two) times daily as needed. APPLY TO AFFECTED FINGER 1 TO 2 TIMES D WITH AT LEAST 1 APPLICATION UNDER OCCLUSION, Disp: , Rfl:  .  conjugated estrogens (PREMARIN) vaginal cream, Place 3.53 Applicatorfuls vaginally 3 (three) times a week. Pt only to use 1/4 of the applicator, Disp: , Rfl:  .  docusate sodium (COLACE) 100 MG capsule, Take 100 mg by mouth daily as needed for mild constipation. , Disp: , Rfl:  .  emtricitabine-tenofovir (TRUVADA) 200-300 MG tablet, TAKE 1 TABLET BY MOUTH EVERY DAY, Disp: , Rfl:  .  isosorbide mononitrate (IMDUR) 30 MG 24 hr tablet, Take 1 tablet (30 mg total) by mouth daily., Disp: 30 tablet, Rfl: 12 .  nitroGLYCERIN (NITROSTAT) 0.4 MG SL tablet, Place 1 tablet (0.4 mg total) under the tongue every 5 (five) minutes as needed for chest pain., Disp: 30 tablet, Rfl: 12 .   omeprazole (PRILOSEC) 40 MG capsule, Take 40 mg by mouth every morning. , Disp: , Rfl:  .  Pirfenidone (ESBRIET) 267 MG CAPS, Take 2 capsules by mouth 2 (two) times daily., Disp: , Rfl:  .  polyethylene glycol (MIRALAX / GLYCOLAX) packet, Take 17 g by mouth daily as needed. , Disp: , Rfl:  .  pravastatin (PRAVACHOL) 80 MG tablet, Take 1 tablet by mouth daily., Disp: , Rfl:  .  raltegravir (ISENTRESS) 400 MG tablet, TAKE 1 TABLET BY MOUTH TWICE DAILY, Disp: , Rfl:  .  senna-docusate (SENOKOT-S) 8.6-50 MG tablet, Take 1 tablet by mouth daily as needed for mild constipation. , Disp: , Rfl:  .  sucralfate (CARAFATE) 1 g tablet, Take 1 g by mouth 2 (two) times daily. , Disp: , Rfl:   Past Medical History: Past Medical History:  Diagnosis Date  . Asthma   . Bradycardia    Sinus bradycardia  . CAD (coronary artery disease)    Minimal by history, Question coronary artery spasm  . Colon polyps   . Dyslipidemia   . Ejection fraction   . GERD (gastroesophageal reflux disease)    Esophageal stricture  . H/O: hysterectomy   . HIV positive ([place])    Needle stick 1993  . Hyperlipidemia   . Palpitations   . Peptic ulcer disease   . Shortness of breath    Extensive evaluation Dr. Winona Legato,  [place], May, 2992, etiology uncertain, probable bronchiolitis possibility of lung biopsy at that time  . Weight loss     Tobacco Use: History  Smoking Status  . Former Smoker  . Quit date: 03/13/1996  Smokeless Tobacco  . Never Used    Labs: Recent Review Flowsheet Data    Labs for ITP Cardiac and Pulmonary Rehab Latest Ref Rng & Units 03/21/2016 03/21/2016   PHART 7.350 - 7.450 - 7.336(L)   PCO2ART 32.0 - 48.0 mmHg - 36.5   HCO3 20.0 - 28.0 mmol/L 19.1(L) 19.5(L)   TCO2 0 - 100 mmol/L 20 21   ACIDBASEDEF 0.0 - 2.0 mmol/L 7.0(H) 6.0(H)   O2SAT % 73.0 99.0      Capillary Blood Glucose: Lab Results  Component Value Date   GLUCAP 87 04/10/2014     ADL UCSD:   Pulmonary Function Assessment:      Pulmonary Function Assessment - 06/26/16 0924      Breath   Bilateral Breath Sounds Other   Other fine crackles throughout both bases   Shortness of Breath Yes;Limiting activity      Exercise Target Goals:    Exercise Program Goal: Individual exercise prescription set with THRR, safety & activity barriers. Participant demonstrates ability to understand and report RPE using BORG scale, to self-measure pulse accurately, and to acknowledge the importance of the exercise prescription.  Exercise Prescription Goal: Starting with aerobic activity 30 plus minutes a day, 3 days per week for initial exercise prescription. Provide home exercise prescription and guidelines that participant acknowledges understanding prior to discharge.  Activity Barriers & Risk Stratification:     Activity Barriers & Cardiac Risk Stratification - 06/26/16 0910      Activity Barriers & Cardiac Risk Stratification   Activity Barriers Shortness of Breath;Balance Concerns;Deconditioning      6 Minute Walk:     6 Minute Walk    Row Name 02/15/16 1609 06/27/16 1632       6 Minute Walk   Phase Initial Initial    Distance 1224 feet 1320 feet    Walk Time 6 minutes 6 minutes    # of Rest Breaks 0 0    MPH 2.31 2.5    METS 2.76 2.91    RPE 13 12    Perceived Dyspnea  3 3    Symptoms No No    Resting HR 67 bpm 84 bpm    Resting BP 112/70 95/66    Max Ex. HR 85 bpm 98 bpm    Max Ex. BP 118/68 104/65      Interval HR   Baseline HR 67 84    1 Minute HR 81 87    2 Minute HR 80 98    3 Minute HR 85 96    4 Minute HR 84 95    5 Minute HR 86 92    6 Minute HR 85 93    2 Minute Post HR 80 85    Interval Heart Rate? Yes Yes      Interval Oxygen   Interval Oxygen? Yes Yes    Baseline Oxygen Saturation % 99 % 94 %    Baseline Liters of Oxygen 0 L 0 L    1 Minute Oxygen Saturation % 99 % 94 %    1 Minute Liters of Oxygen 0 L 0 L    2 Minute Oxygen Saturation % 90 % 94 %    2 Minute Liters of  Oxygen 0 L 0 L  3 Minute Oxygen Saturation % 94 % 94 %    3 Minute Liters of Oxygen 0 L 0 L    4 Minute Oxygen Saturation % 93 % 93 %    4 Minute Liters of Oxygen 0 L 0 L    5 Minute Oxygen Saturation % 99 % 94 %    5 Minute Liters of Oxygen 0 L 0 L    6 Minute Oxygen Saturation % 93 % 95 %    6 Minute Liters of Oxygen 0 L 0 L    2 Minute Post Oxygen Saturation % 100 % 97 %    2 Minute Post Liters of Oxygen 0 L 0 L       Oxygen Initial Assessment:     Oxygen Initial Assessment - 06/27/16 1642      Initial 6 min Walk   Oxygen Used None   Resting Oxygen Saturation  during 6 min walk 94 %   Exercise Oxygen Saturation  during 6 min walk 93 %     Program Oxygen Prescription   Program Oxygen Prescription None      Oxygen Re-Evaluation:     Oxygen Re-Evaluation    Row Name 07/18/16 0838             Program Oxygen Prescription   Program Oxygen Prescription None         Home Oxygen   Home Oxygen Device None       Sleep Oxygen Prescription None       Home Exercise Oxygen Prescription None       Home at Rest Exercise Oxygen Prescription None          Oxygen Discharge (Final Oxygen Re-Evaluation):     Oxygen Re-Evaluation - 07/18/16 0838      Program Oxygen Prescription   Program Oxygen Prescription None     Home Oxygen   Home Oxygen Device None   Sleep Oxygen Prescription None   Home Exercise Oxygen Prescription None   Home at Rest Exercise Oxygen Prescription None      Initial Exercise Prescription:     Initial Exercise Prescription - 06/27/16 1600      Date of Initial Exercise RX and Referring Provider   Date 06/27/16   Referring Provider Dr. Nelda Marseille     Bike   Level 0.5   Minutes 17     NuStep   Level 3   Minutes 17   METs 1.7     Track   Laps 10   Minutes 17     Prescription Details   Frequency (times per week) 2   Duration Progress to 45 minutes of aerobic exercise without signs/symptoms of physical distress     Intensity   THRR  40-80% of Max Heartrate 62-125   Ratings of Perceived Exertion 11-13   Perceived Dyspnea 0-4     Progression   Progression Continue progressive overload as per policy without signs/symptoms or physical distress.     Resistance Training   Training Prescription Yes   Weight orange bands   Reps --  10 minutes of strength training      Perform Capillary Blood Glucose checks as needed.  Exercise Prescription Changes:     Exercise Prescription Changes    Row Name 02/22/16 1500 02/24/16 1500 02/29/16 1600 03/02/16 1500 03/07/16 1500     Response to Exercise   Blood Pressure (Admit) 110/62 112/60 96/58 128/70 108/62   Blood Pressure (Exercise) 104/60 130/64 112/80 96/62 98/58  Blood Pressure (Exit) 98/60 1'08/82 98/60 94/60 '$ 92/54   Heart Rate (Admit) 77 bpm 76 bpm 79 bpm 82 bpm 67 bpm   Heart Rate (Exercise) 96 bpm 92 bpm 102 bpm 81 bpm 87 bpm   Heart Rate (Exit) 68 bpm 68 bpm 74 bpm 66 bpm 71 bpm   Oxygen Saturation (Admit) 95 % 99 % 99 % 98 % 99 %   Oxygen Saturation (Exercise) 96 % 98 % 95 % 98 % 98 %   Oxygen Saturation (Exit) 93 % 92 % 98 % 97 % 98 %   Rating of Perceived Exertion (Exercise) '15 13 16 17 15   '$ Perceived Dyspnea (Exercise) '2 3 3 3 2   '$ Duration Progress to 45 minutes of aerobic exercise without signs/symptoms of physical distress Progress to 45 minutes of aerobic exercise without signs/symptoms of physical distress Progress to 45 minutes of aerobic exercise without signs/symptoms of physical distress Progress to 45 minutes of aerobic exercise without signs/symptoms of physical distress Progress to 45 minutes of aerobic exercise without signs/symptoms of physical distress   Intensity -  40-80% HRR -  40-80% HRR -  40-80% HRR -  40-80% HRR THRR unchanged     Progression   Progression Continue to progress workloads to maintain intensity without signs/symptoms of physical distress. Continue to progress workloads to maintain intensity without signs/symptoms of physical  distress. Continue to progress workloads to maintain intensity without signs/symptoms of physical distress. Continue to progress workloads to maintain intensity without signs/symptoms of physical distress. Continue to progress workloads to maintain intensity without signs/symptoms of physical distress.     Resistance Training   Training Prescription Yes Yes Yes Yes Yes   Weight orange bands orange bands orange bands orange bands orange bands   Reps 10-12  10 minutes of strength training 10-12  10 minutes of strength training 10-12  10 minutes of strength training 10-12  10 minutes of strength training 10-12  10 minutes of strength training     Interval Training   Interval Training No No No No No     NuStep   Level 1  - '2 2 2   '$ Minutes 17  - '17 17 17   '$ METs 2.2  - 1.9 2.2 1.9     Rower   Level '1 1 4 4 1   '$ Minutes '17 17 17 17 17     '$ Track   Laps '13 14 16  '$ - 14   Minutes '17 17 17  '$ - 17     Exercise Review   Progression  -  - Yes  -  -   Row Name 03/14/16 1500 03/16/16 1500 03/28/16 1531 04/04/16 1600 04/06/16 1600     Response to Exercise   Blood Pressure (Admit) 110/68 112/60 102/60 102/66 112/64   Blood Pressure (Exercise) 100/60 96/64 100/62 110/50 100/58   Blood Pressure (Exit) 96/60 100/64 98/60 100/56 104/64   Heart Rate (Admit) 76 bpm 68 bpm 81 bpm 92 bpm 72 bpm   Heart Rate (Exercise) 94 bpm 84 bpm 98 bpm 102 bpm 79 bpm   Heart Rate (Exit) 72 bpm 78 bpm 88 bpm 75 bpm 70 bpm   Oxygen Saturation (Admit) 98 % 100 % 96 % 95 % 96 %   Oxygen Saturation (Exercise) 98 % 97 % 94 % 93 % 96 %   Oxygen Saturation (Exit) 95 % 98 % 97 % 96 % 98 %   Rating of Perceived Exertion (Exercise) '15 17 15 '$ 17  13   Perceived Dyspnea (Exercise) '3 3 3 3 3   '$ Duration Progress to 45 minutes of aerobic exercise without signs/symptoms of physical distress Progress to 45 minutes of aerobic exercise without signs/symptoms of physical distress Progress to 45 minutes of aerobic exercise without  signs/symptoms of physical distress Progress to 45 minutes of aerobic exercise without signs/symptoms of physical distress Progress to 45 minutes of aerobic exercise without signs/symptoms of physical distress   Intensity THRR unchanged THRR unchanged THRR unchanged THRR unchanged THRR unchanged     Progression   Progression Continue to progress workloads to maintain intensity without signs/symptoms of physical distress. Continue to progress workloads to maintain intensity without signs/symptoms of physical distress. Continue to progress workloads to maintain intensity without signs/symptoms of physical distress. Continue to progress workloads to maintain intensity without signs/symptoms of physical distress. Continue to progress workloads to maintain intensity without signs/symptoms of physical distress.     Resistance Training   Training Prescription Yes Yes Yes Yes Yes   Weight orange bands orange bands orange bands orange bands orange bands   Reps 10-12  10 minutes of strength training 10-12  10 minutes of strength training 10-12  10 minutes of strength training 10-12  10 minutes of strength training 10-12  10 minutes of strength training     Interval Training   Interval Training No No No No No     NuStep   Level 2  - 4 4  -   Minutes 17  - 17 17  -   METs 1.8  - 1.7  -  -     Rower   Level '1 1 2 2 2   '$ Minutes '17 17 17 17 17     '$ Track   Laps '15 16 14 15 15   '$ Minutes '17 17 17 17 17   '$ Row Name 04/11/16 1500 04/13/16 1500 07/04/16 1500 07/06/16 1600 07/11/16 1600     Response to Exercise   Blood Pressure (Admit) 104/60 92/50 100/60 100/64 118/66   Blood Pressure (Exercise) 94/50 122/70 120/70 116/60 102/68   Blood Pressure (Exit) 98/64 100/60 104/60 100/60 102/62   Heart Rate (Admit) 72 bpm 69 bpm 62 bpm 74 bpm 90 bpm   Heart Rate (Exercise) 99 bpm 76 bpm 102 bpm 107 bpm 118 bpm   Heart Rate (Exit) 68 bpm 70 bpm 71 bpm 83 bpm 89 bpm   Oxygen Saturation (Admit) 97 % 99 % 98 %  96 % 95 %   Oxygen Saturation (Exercise) 96 % 98 % 96 % 95 % 94 %   Oxygen Saturation (Exit) 96 % 70 % 95 % 97 % 95 %   Rating of Perceived Exertion (Exercise) '15 15 12 15 13   '$ Perceived Dyspnea (Exercise) '3 3 1 2 3   '$ Duration Progress to 45 minutes of aerobic exercise without signs/symptoms of physical distress Progress to 45 minutes of aerobic exercise without signs/symptoms of physical distress Progress to 45 minutes of aerobic exercise without signs/symptoms of physical distress Progress to 45 minutes of aerobic exercise without signs/symptoms of physical distress Progress to 45 minutes of aerobic exercise without signs/symptoms of physical distress   Intensity THRR unchanged THRR unchanged THRR unchanged THRR unchanged THRR unchanged     Progression   Progression Continue to progress workloads to maintain intensity without signs/symptoms of physical distress. Continue to progress workloads to maintain intensity without signs/symptoms of physical distress. Continue to progress workloads to maintain intensity without signs/symptoms of physical distress. Continue  to progress workloads to maintain intensity without signs/symptoms of physical distress. Continue to progress workloads to maintain intensity without signs/symptoms of physical distress.     Resistance Training   Training Prescription Yes Yes Yes Yes Yes   Weight orange bands orange bands orange bands orange bands orange bands   Reps 10-12  10 minutes of strength training 10-12  10 minutes of strength training 10-15 10-15 10-15   Time  -  - -  10 minutes -  10 minutes 10 Minutes     Interval Training   Interval Training  - No No No No     Bike   Level  -  - 0.5 0.5 0.5   Minutes  -  - '17 17 17     '$ NuStep   Level '5 5 3  '$ - 3   Minutes '17 17 17  '$ - 17   METs  - 1.7  -  - 1.4     Rower   Level 2 2  -  -  -   Minutes 17 17  -  -  -     Track   Laps 13  - '16 12 15   '$ Minutes 17  - '17 17 17     '$ Home Exercise Plan   Plans  to continue exercise at  -  -  -  - Home (comment)   Frequency  -  -  -  - Add 3 additional days to program exercise sessions.     Exercise Review   Progression Yes  -  -  -  -   Row Name 07/13/16 1600             Response to Exercise   Blood Pressure (Admit) 120/64       Blood Pressure (Exercise) 124/66       Blood Pressure (Exit) 118/70       Heart Rate (Admit) 77 bpm       Heart Rate (Exercise) 75 bpm       Heart Rate (Exit) 69 bpm       Oxygen Saturation (Admit) 98 %       Oxygen Saturation (Exercise) 97 %       Oxygen Saturation (Exit) 98 %       Rating of Perceived Exertion (Exercise) 13       Perceived Dyspnea (Exercise) 3       Duration Progress to 45 minutes of aerobic exercise without signs/symptoms of physical distress       Intensity THRR unchanged         Progression   Progression Continue to progress workloads to maintain intensity without signs/symptoms of physical distress.         Resistance Training   Training Prescription Yes       Weight orange bands       Reps 10-15       Time 10 Minutes         Interval Training   Interval Training No         Bike   Level 0.5       Minutes 17         NuStep   Level 3       Minutes 17       METs 1.9          Exercise Comments:     Exercise Comments    Row Name 03/02/16 1002 04/04/16 0826 06/26/16 0911 07/11/16 1642  Exercise Comments Patient has only attended three exercise sessions. Will cont. to monitor.  Patient walkes up to 16 laps in 15 minutes. MET level places her at a low level. Rates her level of exertion at a low level. Will cont. to monitor and progress.  patient hopes to be able to meet [facility] exercise requirements needed for transplant Home exercise completed       Exercise Goals and Review:     Exercise Goals    Row Name 06/26/16 0911             Exercise Goals   Increase Physical Activity Yes       Intervention Provide advice, education, support and counseling about physical  activity/exercise needs.;Develop an individualized exercise prescription for aerobic and resistive training based on initial evaluation findings, risk stratification, comorbidities and participant's personal goals.       Expected Outcomes Achievement of increased cardiorespiratory fitness and enhanced flexibility, muscular endurance and strength shown through measurements of functional capacity and personal statement of participant.       Increase Strength and Stamina Yes       Intervention Provide advice, education, support and counseling about physical activity/exercise needs.;Develop an individualized exercise prescription for aerobic and resistive training based on initial evaluation findings, risk stratification, comorbidities and participant's personal goals.       Expected Outcomes Achievement of increased cardiorespiratory fitness and enhanced flexibility, muscular endurance and strength shown through measurements of functional capacity and personal statement of participant.          Exercise Goals Re-Evaluation :     Exercise Goals Re-Evaluation    Row Name 07/17/16 1252             Exercise Goal Re-Evaluation   Exercise Goals Review Increase Physical Activity;Increase Strenth and Stamina       Comments Patient has only attended four exercise sessions. Will cont. to monitor and progress as appropriate.        Expected Outcomes Through exercising at rehab and at home, the patient will be able to increase strength and stamina which will make ADL's easier to preform.          Discharge Exercise Prescription (Final Exercise Prescription Changes):     Exercise Prescription Changes - 07/13/16 1600      Response to Exercise   Blood Pressure (Admit) 120/64   Blood Pressure (Exercise) 124/66   Blood Pressure (Exit) 118/70   Heart Rate (Admit) 77 bpm   Heart Rate (Exercise) 75 bpm   Heart Rate (Exit) 69 bpm   Oxygen Saturation (Admit) 98 %   Oxygen Saturation (Exercise) 97 %    Oxygen Saturation (Exit) 98 %   Rating of Perceived Exertion (Exercise) 13   Perceived Dyspnea (Exercise) 3   Duration Progress to 45 minutes of aerobic exercise without signs/symptoms of physical distress   Intensity THRR unchanged     Progression   Progression Continue to progress workloads to maintain intensity without signs/symptoms of physical distress.     Resistance Training   Training Prescription Yes   Weight orange bands   Reps 10-15   Time 10 Minutes     Interval Training   Interval Training No     Bike   Level 0.5   Minutes 17     NuStep   Level 3   Minutes 17   METs 1.9      Nutrition:  Target Goals: Understanding of nutrition guidelines, daily intake of sodium '1500mg'$ , cholesterol '200mg'$ , calories  30% from fat and 7% or less from saturated fats, daily to have 5 or more servings of fruits and vegetables.  Biometrics:     Pre Biometrics - 06/26/16 0912      Pre Biometrics   Grip Strength 20 kg       Nutrition Therapy Plan and Nutrition Goals:     Nutrition Therapy & Goals - 04/13/16 1512      Nutrition Therapy   Diet Therapeutic Lifestyle Changes     Personal Nutrition Goals   Nutrition Goal Pt to maintain her UBW around 128-135 lb   Personal Goal #2 Decrease consumption of simple carbs (e.g. Coke, sweet tea, desserts)     Intervention Plan   Intervention Prescribe, educate and counsel regarding individualized specific dietary modifications aiming towards targeted core components such as weight, hypertension, lipid management, diabetes, heart failure and other comorbidities.   Expected Outcomes Short Term Goal: Understand basic principles of dietary content, such as calories, fat, sodium, cholesterol and nutrients.;Long Term Goal: Adherence to prescribed nutrition plan.      Nutrition Discharge: Rate Your Plate Scores:     Nutrition Assessments - 04/13/16 1510      Rate Your Plate Scores   Pre Score 38      Nutrition Goals  Re-Evaluation:     Nutrition Goals Re-Evaluation    Row Name 05/10/16 1331             Goals   Current Weight 130 lb 15.3 oz (59.4 kg)       Nutrition Goal Pt to maintain her UBW around 128-135 lb       Comment Wt goal met.  No new information available to assess simple carb intake         Personal Goal #2 Re-Evaluation   Personal Goal #2 Decrease consumption of simple carbs (e.g. Coke, sweet tea, desserts)          Nutrition Goals Discharge (Final Nutrition Goals Re-Evaluation):     Nutrition Goals Re-Evaluation - 05/10/16 1331      Goals   Current Weight 130 lb 15.3 oz (59.4 kg)   Nutrition Goal Pt to maintain her UBW around 128-135 lb   Comment Wt goal met.  No new information available to assess simple carb intake     Personal Goal #2 Re-Evaluation   Personal Goal #2 Decrease consumption of simple carbs (e.g. Coke, sweet tea, desserts)      Psychosocial: Target Goals: Acknowledge presence or absence of significant depression and/or stress, maximize coping skills, provide positive support system. Participant is able to verbalize types and ability to use techniques and skills needed for reducing stress and depression.  Initial Review & Psychosocial Screening:     Initial Psych Review & Screening - 06/26/16 0929      Initial Review   Current issues with None Identified     Family Dynamics   Good Support System? Yes     Barriers   Psychosocial barriers to participate in program There are no identifiable barriers or psychosocial needs.;The patient should benefit from training in stress management and relaxation.     Screening Interventions   Interventions Encouraged to exercise      Quality of Life Scores:   PHQ-9: Recent Review Flowsheet Data    Depression screen [facility] 2/9 06/26/2016 02/07/2016   Decreased Interest 0 1   Down, Depressed, Hopeless 0 0   PHQ - 2 Score 0 1     Interpretation of Total Score  Total  Score Depression Severity:  1-4 =  Minimal depression, 5-9 = Mild depression, 10-14 = Moderate depression, 15-19 = Moderately severe depression, 20-27 = Severe depression   Psychosocial Evaluation and Intervention:     Psychosocial Evaluation - 06/26/16 0930      Psychosocial Evaluation & Interventions   Interventions Encouraged to exercise with the program and follow exercise prescription   Continue Psychosocial Services  No Follow up required      Psychosocial Re-Evaluation:     Psychosocial Re-Evaluation    [place] Name 03/02/16 1111 04/03/16 1441 07/18/16 0842         Psychosocial Re-Evaluation   Current issues with  -  - None Identified     Comments No psychosocial issues identified No psychosocial issues identified  -     Interventions Encouraged to attend Pulmonary Rehabilitation for the exercise Encouraged to attend Pulmonary Rehabilitation for the exercise Encouraged to attend Cardiac Rehabilitation for the exercise     Continue Psychosocial Services  No No No Follow up required        Psychosocial Discharge (Final Psychosocial Re-Evaluation):     Psychosocial Re-Evaluation - 07/18/16 0842      Psychosocial Re-Evaluation   Current issues with None Identified   Interventions Encouraged to attend Cardiac Rehabilitation for the exercise   Continue Psychosocial Services  No Follow up required      Education: Education Goals: Education classes will be provided on a weekly basis, covering required topics. Participant will state understanding/return demonstration of topics presented.  Learning Barriers/Preferences:     Learning Barriers/Preferences - 06/26/16 0924      Learning Barriers/Preferences   Learning Barriers None   Learning Preferences Group Instruction;Written Material;Individual Instruction      Education Topics: Risk Factor Reduction:  -Group instruction that is supported by a PowerPoint presentation. Instructor discusses the definition of a risk factor, different risk factors for  pulmonary disease, and how the heart and lungs work together.     Nutrition for Pulmonary Patient:  -Group instruction provided by PowerPoint slides, verbal discussion, and written materials to support subject matter. The instructor gives an explanation and review of healthy diet recommendations, which includes a discussion on weight management, recommendations for fruit and vegetable consumption, as well as protein, fluid, caffeine, fiber, sodium, sugar, and alcohol. Tips for eating when patients are short of breath are discussed.   Pursed Lip Breathing:  -Group instruction that is supported by demonstration and informational handouts. Instructor discusses the benefits of pursed lip and diaphragmatic breathing and detailed demonstration on how to preform both.     Oxygen Safety:  -Group instruction provided by PowerPoint, verbal discussion, and written material to support subject matter. There is an overview of "What is Oxygen" and "Why do we need it".  Instructor also reviews how to create a safe environment for oxygen use, the importance of using oxygen as prescribed, and the risks of noncompliance. There is a brief discussion on traveling with oxygen and resources the patient may utilize.   Oxygen Equipment:  -Group instruction provided by [facility] Staff utilizing handouts, written materials, and equipment demonstrations.   PULMONARY REHAB OTHER RESPIRATORY from 07/13/2016 in [place]  Date  03/16/16  Educator  [employer]  Instruction Review Code  2- meets goals/outcomes      Signs and Symptoms:  -Group instruction provided by written material and verbal discussion to support subject matter. Warning signs and symptoms of infection, stroke, and heart attack are reviewed and when  to call the physician/911 reinforced. Tips for preventing the spread of infection discussed.   PULMONARY REHAB OTHER RESPIRATORY from 07/13/2016 in [place]  Date  07/13/16  Educator  rn  Instruction Review Code  2- meets goals/outcomes      Advanced Directives:  -Group instruction provided by verbal instruction and written material to support subject matter. Instructor reviews Advanced Directive laws and proper instruction for filling out document.   Pulmonary Video:  -Group video education that reviews the importance of medication and oxygen compliance, exercise, good nutrition, pulmonary hygiene, and pursed lip and diaphragmatic breathing for the pulmonary patient.   Exercise for the Pulmonary Patient:  -Group instruction that is supported by a PowerPoint presentation. Instructor discusses benefits of exercise, core components of exercise, frequency, duration, and intensity of an exercise routine, importance of utilizing pulse oximetry during exercise, safety while exercising, and options of places to exercise outside of rehab.     Pulmonary Medications:  -Verbally interactive group education provided by instructor with focus on inhaled medications and proper administration.   PULMONARY REHAB OTHER RESPIRATORY from 07/13/2016 in [place]  Date  04/06/16  Educator  [facility]  Instruction Review Code  2- meets goals/outcomes      Anatomy and Physiology of the Respiratory System and Intimacy:  -Group instruction provided by PowerPoint, verbal discussion, and written material to support subject matter. Instructor reviews respiratory cycle and anatomical components of the respiratory system and their functions. Instructor also reviews differences in obstructive and restrictive respiratory diseases with examples of each. Intimacy, Sex, and Sexuality differences are reviewed with a discussion on how relationships can change when diagnosed with pulmonary disease. Common sexual concerns are reviewed.   PULMONARY REHAB OTHER RESPIRATORY from 07/13/2016 in [place]  Date   02/24/16  Educator  rn  Instruction Review Code  2- meets goals/outcomes      Knowledge Questionnaire Score:   Core Components/Risk Factors/Patient Goals at Admission:     Personal Goals and Risk Factors at Admission - 06/26/16 0927      Core Components/Risk Factors/Patient Goals on Admission    Weight Management Yes;Weight Loss   Intervention Weight Management: Develop a combined nutrition and exercise program designed to reach desired caloric intake, while maintaining appropriate intake of nutrient and fiber, sodium and fats, and appropriate energy expenditure required for the weight goal.;Weight Management: Provide education and appropriate resources to help participant work on and attain dietary goals.   Expected Outcomes Short Term: Continue to assess and modify interventions until short term weight is achieved   Improve shortness of breath with ADL's Yes   Intervention Provide education, individualized exercise plan and daily activity instruction to help decrease symptoms of SOB with activities of daily living.   Expected Outcomes Short Term: Achieves a reduction of symptoms when performing activities of daily living.   Heart Failure Yes   Intervention Provide a combined exercise and nutrition program that is supplemented with education, support and counseling about heart failure. Directed toward relieving symptoms such as shortness of breath, decreased exercise tolerance, and extremity edema.   Expected Outcomes Improve functional capacity of life;Short term: Attendance in program 2-3 days a week with increased exercise capacity. Reported lower sodium intake. Reported increased fruit and vegetable intake. Reports medication compliance.;Short term: Daily weights obtained and reported for increase. Utilizing diuretic protocols set by physician.;Long term: Adoption of self-care skills and reduction of barriers for early signs and  symptoms recognition and intervention leading to self-care  maintenance.      Core Components/Risk Factors/Patient Goals Review:      Goals and Risk Factor Review    Row Name 03/02/16 1109 04/03/16 1439 07/18/16 0839         Core Components/Risk Factors/Patient Goals Review   Personal Goals Review Develop more efficient breathing techniques such as purse lipped breathing and diaphragmatic breathing and practicing self-pacing with activity.;Improve shortness of breath with ADL's Develop more efficient breathing techniques such as purse lipped breathing and diaphragmatic breathing and practicing self-pacing with activity.;Improve shortness of breath with ADL's Heart Failure;Improve shortness of breath with ADL's;Weight Management/Obesity     Review has attended 3 exercise sessions, too early to see improvement absent for 1 week d/t cardiac catheterization with minimal CAD results, slowly progressing Just started back in program, has attended 4 exercise sessions, maintaining heart failure well, weight stable, seems to have more energy than last enrollment in program      Expected Outcomes expect to see progress in the next 30 days will continue to gain strength and stamina as workloads increase Progress workloads as tolerated, monitor for signs of heart failure, encourage PLB and home exercise        Core Components/Risk Factors/Patient Goals at Discharge (Final Review):      Goals and Risk Factor Review - 07/18/16 0839      Core Components/Risk Factors/Patient Goals Review   Personal Goals Review Heart Failure;Improve shortness of breath with ADL's;Weight Management/Obesity   Review Just started back in program, has attended 4 exercise sessions, maintaining heart failure well, weight stable, seems to have more energy than last enrollment in program    Expected Outcomes Progress workloads as tolerated, monitor for signs of heart failure, encourage PLB and home exercise      ITP Comments:   Comments: ITP REVIEW Pt is making expected progress  toward pulmonary rehab goals after completing 4 sessions. Recommend continued exercise, life style modification, education, and utilization of breathing techniques to increase stamina and strength and decrease shortness of breath with exertion.

## 2016-07-20 ENCOUNTER — Encounter (HOSPITAL_COMMUNITY)
Admission: RE | Admit: 2016-07-20 | Discharge: 2016-07-20 | Disposition: A | Discharge status: Home / Self Care | Payer: Medicare Other | Source: Ambulatory Visit | Attending: Pulmonary Disease | Admitting: Pulmonary Disease

## 2016-07-25 ENCOUNTER — Encounter (HOSPITAL_COMMUNITY): Payer: Medicare Other

## 2016-07-27 ENCOUNTER — Encounter (HOSPITAL_COMMUNITY)
Admission: RE | Admit: 2016-07-27 | Discharge: 2016-07-27 | Disposition: A | Discharge status: Home / Self Care | Payer: Medicare Other | Source: Ambulatory Visit | Attending: Pulmonary Disease | Admitting: Pulmonary Disease

## 2016-07-27 VITALS — Wt 138.2 lb

## 2016-07-27 DIAGNOSIS — J8410 Pulmonary fibrosis, unspecified: Secondary | ICD-10-CM | POA: Diagnosis not present

## 2016-07-27 NOTE — Progress Notes (Signed)
Daily Session Note  Patient Details  Name: [name] [name] MRN: [id number] Date of Birth: 02/14/1951 Referring Provider:     Pulmonary Rehab Walk Test from 06/27/2016 in [place]  Referring Provider  Dr. Nelda Marseille      Encounter Date: 07/27/2016  Check In:     Session Check In - 07/27/16 1341      Check-In   Location [facility]   Staff Present Su Hilt, MS, ACSM RCEP, Exercise Physiologist;Joan Leonia Reeves, RN, Roque Cash, RN   Supervising physician immediately available to respond to emergencies [facility] immediately available   Physician(s) Dr. Broadus John   Medication changes reported     No   Fall or balance concerns reported    No   Tobacco Cessation No Change   Warm-up and Cool-down Performed as group-led instruction   Resistance Training Performed Yes   VAD Patient? No     Pain Assessment   Currently in Pain? No/denies   Multiple Pain Sites No      Capillary Blood Glucose: No results found for this or any previous visit (from the past 24 hour(s)).      Exercise Prescription Changes - 07/27/16 1500      Response to Exercise   Blood Pressure (Admit) 100/70   Blood Pressure (Exercise) 130/80   Blood Pressure (Exit) 100/60   Heart Rate (Admit) 86 bpm   Heart Rate (Exercise) 101 bpm   Heart Rate (Exit) 81 bpm   Oxygen Saturation (Admit) 96 %   Oxygen Saturation (Exercise) 92 %   Oxygen Saturation (Exit) 98 %   Rating of Perceived Exertion (Exercise) 13   Perceived Dyspnea (Exercise) 3   Duration Progress to 45 minutes of aerobic exercise without signs/symptoms of physical distress   Intensity THRR unchanged     Progression   Progression Continue to progress workloads to maintain intensity without signs/symptoms of physical distress.     Resistance Training   Training Prescription Yes   Weight orange bands   Reps 10-15   Time 10 Minutes     Interval Training   Interval Training No     NuStep    Level 3   Minutes 17   METs 2.6     Track   Laps 16   Minutes 17     Exercise Review   Progression Yes      History  Smoking Status  . Former Smoker  . Quit date: 03/13/1996  Smokeless Tobacco  . Never Used    Goals Met:  Exercise tolerated well No report of cardiac concerns or symptoms Strength training completed today  Goals Unmet:  Not Applicable  Comments: Service time is from 1330 to 1510    Dr. Rush Farmer is Medical Director for Pulmonary Rehab at [facility].

## 2016-08-01 ENCOUNTER — Encounter (HOSPITAL_COMMUNITY)
Admission: RE | Admit: 2016-08-01 | Discharge: 2016-08-01 | Disposition: A | Discharge status: Home / Self Care | Payer: Medicare Other | Source: Ambulatory Visit | Attending: Pulmonary Disease | Admitting: Pulmonary Disease

## 2016-08-01 VITALS — Wt 140.0 lb

## 2016-08-01 DIAGNOSIS — J8410 Pulmonary fibrosis, unspecified: Secondary | ICD-10-CM

## 2016-08-01 NOTE — Progress Notes (Signed)
Daily Session Note  Patient Details  Name: [name] [name] MRN: [id number] Date of Birth: 12/10/1950 Referring Provider:     Pulmonary Rehab Walk Test from 06/27/2016 in [facility]  Referring Provider  Dr. Yacoub      Encounter Date: 08/01/2016  Check In:     Session Check In - 08/01/16 1330      Check-In   Location [facility]   Staff Present  , RN, BSN;Molly diVincenzo, MS, ACSM RCEP, Exercise Physiologist;Lisa Hughes, RN;Portia Payne, RN, BSN   Supervising physician immediately available to respond to emergencies [facility] immediately available   Physician(s) Dr. Joseph   Medication changes reported     No   Fall or balance concerns reported    No   Tobacco Cessation No Change   Warm-up and Cool-down Performed as group-led instruction   Resistance Training Performed Yes   VAD Patient? No     Pain Assessment   Currently in Pain? No/denies   Multiple Pain Sites No      Capillary Blood Glucose: No results found for this or any previous visit (from the past 24 hour(s)).      Exercise Prescription Changes - 08/01/16 1500      Response to Exercise   Blood Pressure (Admit) 110/72   Blood Pressure (Exercise) 110/60   Blood Pressure (Exit) 98/70   Heart Rate (Admit) 69 bpm   Heart Rate (Exercise) 117 bpm   Heart Rate (Exit) 83 bpm   Oxygen Saturation (Admit) 95 %   Oxygen Saturation (Exercise) 94 %   Oxygen Saturation (Exit) 95 %   Rating of Perceived Exertion (Exercise) 13   Perceived Dyspnea (Exercise) 3   Duration Progress to 45 minutes of aerobic exercise without signs/symptoms of physical distress   Intensity THRR unchanged     Progression   Progression Continue to progress workloads to maintain intensity without signs/symptoms of physical distress.     Resistance Training   Training Prescription Yes   Weight orange bands   Reps 10-15   Time 10 Minutes     Interval Training   Interval  Training No     Bike   Level 1   Minutes 17     NuStep   Level 5   Minutes 17     Track   Laps 14   Minutes 17      History  Smoking Status  . Former Smoker  . Quit date: 03/13/1996  Smokeless Tobacco  . Never Used    Goals Met:  Exercise tolerated well Strength training completed today  Goals Unmet:  Not Applicable  Comments: Service time is from 1330 to 1500    Dr. Wesam G. Yacoub is Medical Director for Pulmonary Rehab at [facility]. 

## 2016-08-03 ENCOUNTER — Encounter (HOSPITAL_COMMUNITY)
Admission: RE | Admit: 2016-08-03 | Discharge: 2016-08-03 | Disposition: A | Discharge status: Home / Self Care | Payer: Medicare Other | Source: Ambulatory Visit | Attending: Pulmonary Disease | Admitting: Pulmonary Disease

## 2016-08-03 VITALS — Wt 139.3 lb

## 2016-08-03 DIAGNOSIS — J8410 Pulmonary fibrosis, unspecified: Secondary | ICD-10-CM | POA: Diagnosis not present

## 2016-08-03 NOTE — Progress Notes (Signed)
Daily Session Note  Patient Details  Name: STEFHANIE KACHMAR MRN: [id number] Date of Birth: [birth year] Referring Provider:     Pulmonary Rehab Walk Test from 06/27/2016 in [place]  Referring Provider  Dr. Nelda Marseille      Encounter Date: 08/03/2016  Check In:   Capillary Blood Glucose: No results found for this or any previous visit (from the past 24 hour(s)).      Exercise Prescription Changes - 08/03/16 1500      Response to Exercise   Blood Pressure (Admit) 110/60   Blood Pressure (Exercise) 118/62   Blood Pressure (Exit) 110/80   Heart Rate (Admit) 71 bpm   Heart Rate (Exercise) 103 bpm   Heart Rate (Exit) 85 bpm   Oxygen Saturation (Admit) 95 %   Oxygen Saturation (Exercise) 96 %   Oxygen Saturation (Exit) 94 %   Rating of Perceived Exertion (Exercise) 13   Perceived Dyspnea (Exercise) 3   Duration Progress to 45 minutes of aerobic exercise without signs/symptoms of physical distress   Intensity THRR unchanged     Progression   Progression Continue to progress workloads to maintain intensity without signs/symptoms of physical distress.     Resistance Training   Training Prescription Yes   Weight orange bands   Reps 10-15   Time 10 Minutes     Interval Training   Interval Training No     Bike   Level 1.4   Minutes 17     NuStep   Level 5   Minutes 17   METs 2.1      History  Smoking Status  . Former Smoker  . Quit date: 03/13/1996  Smokeless Tobacco  . Never Used    Goals Met:  Exercise tolerated well No report of cardiac concerns or symptoms Strength training completed today  Goals Unmet:  Not Applicable  Comments: [phone]   Dr. Rush Farmer is Medical Director for Pulmonary Rehab at [facility].

## 2016-08-08 ENCOUNTER — Encounter (HOSPITAL_COMMUNITY)
Admission: RE | Admit: 2016-08-08 | Discharge: 2016-08-08 | Disposition: A | Discharge status: Home / Self Care | Payer: Medicare Other | Source: Ambulatory Visit | Attending: Pulmonary Disease | Admitting: Pulmonary Disease

## 2016-08-08 VITALS — Wt 139.3 lb

## 2016-08-08 DIAGNOSIS — J8410 Pulmonary fibrosis, unspecified: Secondary | ICD-10-CM | POA: Diagnosis not present

## 2016-08-08 NOTE — Progress Notes (Signed)
Daily Session Note  Patient Details  Name: [name] [name] MRN: [id number] Date of Birth: October 14, 1950 Referring Provider:     Pulmonary Rehab Walk Test from 06/27/2016 in [place]  Referring Provider  Dr. Nelda Marseille      Encounter Date: 08/08/2016  Check In:     Session Check In - 08/08/16 1325      Check-In   Location [facility]   Staff Present Trish Fountain, RN, Maxcine Ham, RN, BSN;Crews Mccollam, MS, ACSM RCEP, Exercise Physiologist   Supervising physician immediately available to respond to emergencies [facility] immediately available   Physician(s) Dr. Karleen Hampshire   Medication changes reported     No   Fall or balance concerns reported    No   Tobacco Cessation No Change   Warm-up and Cool-down Performed as group-led instruction   Resistance Training Performed Yes   VAD Patient? No     Pain Assessment   Currently in Pain? No/denies   Multiple Pain Sites No      Capillary Blood Glucose: No results found for this or any previous visit (from the past 24 hour(s)).      Exercise Prescription Changes - 08/08/16 1500      Response to Exercise   Blood Pressure (Admit) 124/74   Blood Pressure (Exercise) 112/66   Blood Pressure (Exit) 107/75   Heart Rate (Admit) 75 bpm   Heart Rate (Exercise) 122 bpm   Heart Rate (Exit) 76 bpm   Oxygen Saturation (Admit) 98 %   Oxygen Saturation (Exercise) 95 %   Oxygen Saturation (Exit) 95 %   Rating of Perceived Exertion (Exercise) 15   Perceived Dyspnea (Exercise) 3   Duration Progress to 45 minutes of aerobic exercise without signs/symptoms of physical distress   Intensity THRR unchanged     Progression   Progression Continue to progress workloads to maintain intensity without signs/symptoms of physical distress.     Resistance Training   Training Prescription Yes   Weight orange bands   Reps 10-15   Time 10 Minutes     Interval Training   Interval Training No     Bike   Level 1.4   Minutes 17     NuStep   Level 5   Minutes 17   METs 2.2     Track   Laps 15   Minutes 17      History  Smoking Status  . Former Smoker  . Quit date: 03/13/1996  Smokeless Tobacco  . Never Used    Goals Met:  Exercise tolerated well No report of cardiac concerns or symptoms Strength training completed today  Goals Unmet:  Not Applicable  Comments: Service time is from 1:30p to 3:10p    Dr. Rush Farmer is Medical Director for Pulmonary Rehab at [facility].

## 2016-08-10 ENCOUNTER — Encounter (HOSPITAL_COMMUNITY)
Admission: RE | Admit: 2016-08-10 | Discharge: 2016-08-10 | Disposition: A | Discharge status: Home / Self Care | Payer: Medicare Other | Source: Ambulatory Visit | Attending: Pulmonary Disease | Admitting: Pulmonary Disease

## 2016-08-10 VITALS — Wt 138.2 lb

## 2016-08-10 DIAGNOSIS — J8410 Pulmonary fibrosis, unspecified: Secondary | ICD-10-CM

## 2016-08-10 NOTE — Progress Notes (Signed)
Daily Session Note  Patient Details  Name: [name] [name] MRN: [id number] Date of Birth: 09/14/1950 Referring Provider:     Pulmonary Rehab Walk Test from 06/27/2016 in [facility]  Referring Provider  Dr. Yacoub      Encounter Date: 08/10/2016  Check In:     Session Check In - 08/10/16 1345      Check-In   Location [facility]   Staff Present Portia Payne, RN, BSN; , MS, ACSM RCEP, Exercise Physiologist   Supervising physician immediately available to respond to emergencies [facility] immediately available   Physician(s) Dr. Joseph   Medication changes reported     No   Fall or balance concerns reported    No   Tobacco Cessation No Change   Warm-up and Cool-down Performed as group-led instruction   Resistance Training Performed Yes   VAD Patient? No     Pain Assessment   Currently in Pain? No/denies   Multiple Pain Sites No      Capillary Blood Glucose: No results found for this or any previous visit (from the past 24 hour(s)).      Exercise Prescription Changes - 08/10/16 1600      Response to Exercise   Blood Pressure (Admit) 100/72   Blood Pressure (Exercise) 128/80   Blood Pressure (Exit) 110/80   Heart Rate (Admit) 76 bpm   Heart Rate (Exercise) 111 bpm   Heart Rate (Exit) 100 bpm   Oxygen Saturation (Admit) 96 %   Oxygen Saturation (Exercise) 95 %   Oxygen Saturation (Exit) 94 %   Rating of Perceived Exertion (Exercise) 13   Perceived Dyspnea (Exercise) 3   Duration Progress to 45 minutes of aerobic exercise without signs/symptoms of physical distress   Intensity THRR unchanged     Progression   Progression Continue to progress workloads to maintain intensity without signs/symptoms of physical distress.     Resistance Training   Training Prescription Yes   Weight orange bands   Reps 10-15   Time 10 Minutes     Interval Training   Interval Training No     Treadmill   MPH 1.7    Grade 0   Minutes 30      History  Smoking Status  . Former Smoker  . Quit date: 03/13/1996  Smokeless Tobacco  . Never Used    Goals Met:  Exercise tolerated well No report of cardiac concerns or symptoms Strength training completed today  Goals Unmet:  Not Applicable  Comments: Service time is from 1:30p to 3:10p    Dr. Wesam G. Yacoub is Medical Director for Pulmonary Rehab at [facility]. 

## 2016-08-15 ENCOUNTER — Encounter (HOSPITAL_COMMUNITY)
Admission: RE | Admit: 2016-08-15 | Discharge: 2016-08-15 | Disposition: A | Discharge status: Home / Self Care | Payer: Medicare Other | Source: Ambulatory Visit | Attending: Pulmonary Disease | Admitting: Pulmonary Disease

## 2016-08-15 VITALS — Wt 142.0 lb

## 2016-08-15 DIAGNOSIS — J8410 Pulmonary fibrosis, unspecified: Secondary | ICD-10-CM | POA: Insufficient documentation

## 2016-08-15 NOTE — Progress Notes (Signed)
Pulmonary Individual Treatment Plan  Patient Details  Name: [name] [name] MRN: [id number] Date of Birth: [birth year] Referring Provider:     Pulmonary Rehab Walk Test from 06/27/2016 in [facility]  Referring Provider  Dr. Nelda Marseille      Initial Encounter Date:    Pulmonary Rehab Walk Test from 06/27/2016 in [facility]  Date  06/27/16  Referring Provider  Dr. Nelda Marseille      Visit Diagnosis: Pulmonary fibrosis ([place])  Patient's Home Medications on Admission:   Current Outpatient Prescriptions:  .  albuterol (PROVENTIL HFA;VENTOLIN HFA) 108 (90 Base) MCG/ACT inhaler, INHALE 2 PUFFS BY MOUTH AS DIRECTED AS NEEDED FOR SHORTNESS OF BREATH, Disp: , Rfl:  .  amitriptyline (ELAVIL) 50 MG tablet, Take 50 mg by mouth at bedtime.  , Disp: , Rfl:  .  aspirin EC 81 MG tablet, Take 1 tablet (81 mg total) by mouth daily., Disp: 90 tablet, Rfl: 3 .  budesonide-formoterol (SYMBICORT) 160-4.5 MCG/ACT inhaler, Inhale 2 puffs into the lungs 2 (two) times daily as needed. , Disp: , Rfl:  .  clobetasol cream (TEMOVATE) 0.97 %, Apply 1 application topically 2 (two) times daily as needed. APPLY TO AFFECTED FINGER 1 TO 2 TIMES D WITH AT LEAST 1 APPLICATION UNDER OCCLUSION, Disp: , Rfl:  .  conjugated estrogens (PREMARIN) vaginal cream, Place 3.53 Applicatorfuls vaginally 3 (three) times a week. Pt only to use 1/4 of the applicator, Disp: , Rfl:  .  docusate sodium (COLACE) 100 MG capsule, Take 100 mg by mouth daily as needed for mild constipation. , Disp: , Rfl:  .  emtricitabine-tenofovir (TRUVADA) 200-300 MG tablet, TAKE 1 TABLET BY MOUTH EVERY DAY, Disp: , Rfl:  .  isosorbide mononitrate (IMDUR) 30 MG 24 hr tablet, Take 1 tablet (30 mg total) by mouth daily., Disp: 30 tablet, Rfl: 12 .  nitroGLYCERIN (NITROSTAT) 0.4 MG SL tablet, Place 1 tablet (0.4 mg total) under the tongue every 5 (five) minutes as needed for chest pain., Disp: 30 tablet, Rfl: 12 .   omeprazole (PRILOSEC) 40 MG capsule, Take 40 mg by mouth every morning. , Disp: , Rfl:  .  Pirfenidone (ESBRIET) 267 MG CAPS, Take 2 capsules by mouth 2 (two) times daily., Disp: , Rfl:  .  polyethylene glycol (MIRALAX / GLYCOLAX) packet, Take 17 g by mouth daily as needed. , Disp: , Rfl:  .  pravastatin (PRAVACHOL) 80 MG tablet, Take 1 tablet by mouth daily., Disp: , Rfl:  .  raltegravir (ISENTRESS) 400 MG tablet, TAKE 1 TABLET BY MOUTH TWICE DAILY, Disp: , Rfl:  .  senna-docusate (SENOKOT-S) 8.6-50 MG tablet, Take 1 tablet by mouth daily as needed for mild constipation. , Disp: , Rfl:  .  sucralfate (CARAFATE) 1 g tablet, Take 1 g by mouth 2 (two) times daily. , Disp: , Rfl:   Past Medical History: Past Medical History:  Diagnosis Date  . Asthma   . Bradycardia    Sinus bradycardia  . CAD (coronary artery disease)    Minimal by history, Question coronary artery spasm  . Colon polyps   . Dyslipidemia   . Ejection fraction   . GERD (gastroesophageal reflux disease)    Esophageal stricture  . H/O: hysterectomy   . HIV positive ([place])    Needle stick 1993  . Hyperlipidemia   . Palpitations   . Peptic ulcer disease   . Shortness of breath    Extensive evaluation Dr. Winona Legato,  [place], May, 8921, etiology uncertain, probable bronchiolitis possibility of lung biopsy at that time  . Weight loss     Tobacco Use: History  Smoking Status  . Former Smoker  . Quit date: 03/13/1996  Smokeless Tobacco  . Never Used    Labs: Recent Review Flowsheet Data    Labs for ITP Cardiac and Pulmonary Rehab Latest Ref Rng & Units 03/21/2016 03/21/2016   PHART 7.350 - 7.450 - 7.336(L)   PCO2ART 32.0 - 48.0 mmHg - 36.5   HCO3 20.0 - 28.0 mmol/L 19.1(L) 19.5(L)   TCO2 0 - 100 mmol/L 20 21   ACIDBASEDEF 0.0 - 2.0 mmol/L 7.0(H) 6.0(H)   O2SAT % 73.0 99.0      Capillary Blood Glucose: Lab Results  Component Value Date   GLUCAP 87 04/10/2014     ADL UCSD:     Pulmonary Assessment Scores     Row Name 07/19/16 1203         ADL UCSD   ADL Phase Entry     SOB Score total 77       CAT Score   CAT Score 27  Pre        Pulmonary Function Assessment:     Pulmonary Function Assessment - 06/26/16 0924      Breath   Bilateral Breath Sounds Other   Other fine crackles throughout both bases   Shortness of Breath Yes;Limiting activity      Exercise Target Goals:    Exercise Program Goal: Individual exercise prescription set with THRR, safety & activity barriers. Participant demonstrates ability to understand and report RPE using BORG scale, to self-measure pulse accurately, and to acknowledge the importance of the exercise prescription.  Exercise Prescription Goal: Starting with aerobic activity 30 plus minutes a day, 3 days per week for initial exercise prescription. Provide home exercise prescription and guidelines that participant acknowledges understanding prior to discharge.  Activity Barriers & Risk Stratification:     Activity Barriers & Cardiac Risk Stratification - 06/26/16 0910      Activity Barriers & Cardiac Risk Stratification   Activity Barriers Shortness of Breath;Balance Concerns;Deconditioning      6 Minute Walk:     6 Minute Walk    Row Name 06/27/16 1632         6 Minute Walk   Phase Initial     Distance 1320 feet     Walk Time 6 minutes     # of Rest Breaks 0     MPH 2.5     METS 2.91     RPE 12     Perceived Dyspnea  3     Symptoms No     Resting HR 84 bpm     Resting BP 95/66     Max Ex. HR 98 bpm     Max Ex. BP 104/65       Interval HR   Baseline HR 84     1 Minute HR 87     2 Minute HR 98     3 Minute HR 96     4 Minute HR 95     5 Minute HR 92     6 Minute HR 93     2 Minute Post HR 85     Interval Heart Rate? Yes       Interval Oxygen   Interval Oxygen? Yes     Baseline Oxygen Saturation % 94 %     Baseline Liters of Oxygen 0 L  1 Minute Oxygen Saturation % 94 %     1 Minute Liters of Oxygen 0 L     2  Minute Oxygen Saturation % 94 %     2 Minute Liters of Oxygen 0 L     3 Minute Oxygen Saturation % 94 %     3 Minute Liters of Oxygen 0 L     4 Minute Oxygen Saturation % 93 %     4 Minute Liters of Oxygen 0 L     5 Minute Oxygen Saturation % 94 %     5 Minute Liters of Oxygen 0 L     6 Minute Oxygen Saturation % 95 %     6 Minute Liters of Oxygen 0 L     2 Minute Post Oxygen Saturation % 97 %     2 Minute Post Liters of Oxygen 0 L        Oxygen Initial Assessment:     Oxygen Initial Assessment - 06/27/16 1642      Initial 6 min Walk   Oxygen Used None   Resting Oxygen Saturation  during 6 min walk 94 %   Exercise Oxygen Saturation  during 6 min walk 93 %     Program Oxygen Prescription   Program Oxygen Prescription None      Oxygen Re-Evaluation:     Oxygen Re-Evaluation    Row Name 07/18/16 0838 08/15/16 0952           Program Oxygen Prescription   Program Oxygen Prescription None None        Home Oxygen   Home Oxygen Device None None      Sleep Oxygen Prescription None None      Home Exercise Oxygen Prescription None None      Home at Rest Exercise Oxygen Prescription None None         Oxygen Discharge (Final Oxygen Re-Evaluation):     Oxygen Re-Evaluation - 08/15/16 0952      Program Oxygen Prescription   Program Oxygen Prescription None     Home Oxygen   Home Oxygen Device None   Sleep Oxygen Prescription None   Home Exercise Oxygen Prescription None   Home at Rest Exercise Oxygen Prescription None      Initial Exercise Prescription:     Initial Exercise Prescription - 06/27/16 1600      Date of Initial Exercise RX and Referring Provider   Date 06/27/16   Referring Provider Dr. Nelda Marseille     Bike   Level 0.5   Minutes 17     NuStep   Level 3   Minutes 17   METs 1.7     Track   Laps 10   Minutes 17     Prescription Details   Frequency (times per week) 2   Duration Progress to 45 minutes of aerobic exercise without  signs/symptoms of physical distress     Intensity   THRR 40-80% of Max Heartrate 62-125   Ratings of Perceived Exertion 11-13   Perceived Dyspnea 0-4     Progression   Progression Continue progressive overload as per policy without signs/symptoms or physical distress.     Resistance Training   Training Prescription Yes   Weight orange bands   Reps --  10 minutes of strength training      Perform Capillary Blood Glucose checks as needed.  Exercise Prescription Changes:     Exercise Prescription Changes    Row Name 02/22/16 1500 02/24/16  1500 02/29/16 1600 03/02/16 1500 03/07/16 1500     Response to Exercise   Blood Pressure (Admit) 110/62 112/60 96/58 128/70 108/62   Blood Pressure (Exercise) 104/60 130/64 1_0   Blood Pressure (Exit) 98/60 1_1 92/54   Heart Rate (Admit) 77 bpm 76 bpm 79 bpm 82 bpm 67 bpm   Heart Rate (Exercise) 96 bpm 92 bpm 102 bpm 81 bpm 87 bpm   Heart Rate (Exit) 68 bpm 68 bpm 74 bpm 66 bpm 71 bpm   Oxygen Saturation (Admit) 95 % 99 % 99 % 98 % 99 %   Oxygen Saturation (Exercise) 96 % 98 % 95 % 98 % 98 %   Oxygen Saturation (Exit) 93 % 92 % 98 % 97 % 98 %   Rating of Perceived Exertion (Exercise) _2 Perceived Dyspnea (Exercise) _3 Duration Progress to 45 minutes of aerobic exercise without signs/symptoms of physical distress Progress to 45 minutes of aerobic exercise without signs/symptoms of physical distress Progress to 45 minutes of aerobic exercise without signs/symptoms of physical distress Progress to 45 minutes of aerobic exercise without signs/symptoms of physical distress Progress to 45 minutes of aerobic exercise without signs/symptoms of physical distress   Intensity -  40-80% HRR -  40-80% HRR -  40-80% HRR -  40-80% HRR THRR unchanged     Progression   Progression Continue to progress workloads to maintain intensity without signs/symptoms of physical distress. Continue to progress  workloads to maintain intensity without signs/symptoms of physical distress. Continue to progress workloads to maintain intensity without signs/symptoms of physical distress. Continue to progress workloads to maintain intensity without signs/symptoms of physical distress. Continue to progress workloads to maintain intensity without signs/symptoms of physical distress.     Resistance Training   Training Prescription _4    Weight _5    Reps 10-12  10 minutes of strength training 10-12  10 minutes of strength training 10-12  10 minutes of strength training 10-12  10 minutes of strength training 10-12  10 minutes of strength training     Interval Training   Interval Training _6      NuStep   Level 1  - _7 Minutes 17  - _8 METs 2.2  - 1.9 2.2 1.9     Rower   Level _9 Minutes _10 Track   Laps _11 - 14   Minutes _12 - 17     Exercise Review   Progression  -  - Yes  -  -   Row Name 03/14/16 1500 03/16/16 1500 03/28/16 1531 04/04/16 1600 04/06/16 1600     Response to Exercise   Blood Pressure (Admit) 110/68 112/60 102/60 102/66 112/64   Blood Pressure (Exercise) 100/60 96/64 100/62 110/50 100/58   Blood Pressure (Exit) 96/60 100/64 98/60 100/56 104/64   Heart Rate (Admit) 76 bpm 68 bpm 81 bpm 92 bpm 72 bpm   Heart Rate (Exercise) 94 bpm 84 bpm 98 bpm 102 bpm 79 bpm   Heart Rate (Exit) 72 bpm 78 bpm 88 bpm 75 bpm 70 bpm   Oxygen Saturation (Admit) 98 % 100 % 96 % 95 % 96 %   Oxygen Saturation (Exercise)  98 % 97 % 94 % 93 % 96 %   Oxygen Saturation (Exit) 95 % 98 % 97 % 96 % 98 %   Rating of Perceived Exertion (Exercise) _0 Perceived Dyspnea (Exercise) _1 Duration Progress to 45 minutes of aerobic exercise without signs/symptoms of physical distress Progress to 45 minutes of aerobic exercise without signs/symptoms of physical  distress Progress to 45 minutes of aerobic exercise without signs/symptoms of physical distress Progress to 45 minutes of aerobic exercise without signs/symptoms of physical distress Progress to 45 minutes of aerobic exercise without signs/symptoms of physical distress   Intensity _2      Progression   Progression Continue to progress workloads to maintain intensity without signs/symptoms of physical distress. Continue to progress workloads to maintain intensity without signs/symptoms of physical distress. Continue to progress workloads to maintain intensity without signs/symptoms of physical distress. Continue to progress workloads to maintain intensity without signs/symptoms of physical distress. Continue to progress workloads to maintain intensity without signs/symptoms of physical distress.     Resistance Training   Training Prescription _3    Weight _4    Reps 10-12  10 minutes of strength training 10-12  10 minutes of strength training 10-12  10 minutes of strength training 10-12  10 minutes of strength training 10-12  10 minutes of strength training     Interval Training   Interval Training _5      NuStep   Level 2  - 4 4  -   Minutes 17  - 17 17  -   METs 1.8  - 1.7  -  -     Rower   Level _6 Minutes _7 Track   Laps _8 Minutes _9 Row Name 04/11/16 1500 04/13/16 1500 07/04/16 1500 07/06/16 1600 07/11/16 1600     Response to Exercise   Blood Pressure (Admit) 104/60 92/50 100/60 100/64 118/66   Blood Pressure (Exercise) 94/50 122/70 120/70 116/60 102/68   Blood Pressure (Exit) 98/64 100/60 104/60 100/60 102/62   Heart Rate (Admit) 72 bpm 69 bpm 62 bpm 74 bpm 90 bpm   Heart Rate (Exercise) 99 bpm 76 bpm 102 bpm 107 bpm 118 bpm   Heart Rate (Exit) 68 bpm 70 bpm 71  bpm 83 bpm 89 bpm   Oxygen Saturation (Admit) 97 % 99 % 98 % 96 % 95 %   Oxygen Saturation (Exercise) 96 % 98 % 96 % 95 % 94 %   Oxygen Saturation (Exit) 96 % 70 % 95 % 97 % 95 %   Rating of Perceived Exertion (Exercise) _10 Perceived Dyspnea (Exercise) _11 Duration Progress to 45 minutes of aerobic exercise without signs/symptoms of physical distress Progress to 45 minutes of aerobic exercise without signs/symptoms of physical distress Progress to 45 minutes of aerobic exercise without signs/symptoms of physical distress Progress to 45 minutes of aerobic exercise without signs/symptoms of physical distress Progress to 45 minutes of aerobic exercise without signs/symptoms of physical distress   Intensity _12      Progression   Progression Continue  to progress workloads to maintain intensity without signs/symptoms of physical distress. Continue to progress workloads to maintain intensity without signs/symptoms of physical distress. Continue to progress workloads to maintain intensity without signs/symptoms of physical distress. Continue to progress workloads to maintain intensity without signs/symptoms of physical distress. Continue to progress workloads to maintain intensity without signs/symptoms of physical distress.     Resistance Training   Training Prescription _0    Weight _1    Reps 10-12  10 minutes of strength training 10-12  10 minutes of strength training 10-15 10-15 10-15   Time  -  - -  10 minutes -  10 minutes 10 Minutes     Interval Training   Interval Training  - No No No No     Bike   Level  -  - 0.5 0.5 0.5   Minutes  -  - _2 NuStep   Level _3 - 3   Minutes _4 - 17   METs  - 1.7  -  - 1.4     Rower   Level 2 2  -  -  -   Minutes 17 17  -  -  -     Track   Laps 13  - _5 Minutes 17  - _6 Home Exercise Plan   Plans to continue exercise at  -  -  -  - Home (comment)   Frequency  -  -  -  - Add 3 additional days to program exercise sessions.     Exercise Review   Progression Yes  -  -  -  -   Row Name 07/13/16 1600 07/18/16 1528 07/27/16 1500 08/01/16 1500 08/03/16 1500     Response to Exercise   Blood Pressure (Admit) 120/64 130/80 100/70 110/72 110/60   Blood Pressure (Exercise) 124/66 116/60 130/80 110/60 118/62   Blood Pressure (Exit) 118/70 98/60 100/60 98/70 110/80   Heart Rate (Admit) 77 bpm 74 bpm 86 bpm 69 bpm 71 bpm   Heart Rate (Exercise) 75 bpm 97 bpm 101 bpm 117 bpm 103 bpm   Heart Rate (Exit) 69 bpm 77 bpm 81 bpm 83 bpm 85 bpm   Oxygen Saturation (Admit) 98 % 98 % 96 % 95 % 95 %   Oxygen Saturation (Exercise) 97 % 94 % 92 % 94 % 96 %   Oxygen Saturation (Exit) 98 % 94 % 98 % 95 % 94 %   Rating of Perceived Exertion (Exercise) _7 Perceived Dyspnea (Exercise) _8 Duration Progress to 45 minutes of aerobic exercise without signs/symptoms of physical distress Progress to 45 minutes of aerobic exercise without signs/symptoms of physical distress Progress to 45 minutes of aerobic exercise without signs/symptoms of physical distress Progress to 45 minutes of aerobic exercise without signs/symptoms of physical distress Progress to 45 minutes of aerobic exercise without signs/symptoms of physical distress   Intensity _9      Progression   Progression Continue to progress workloads to maintain intensity without signs/symptoms of physical distress. Continue to progress workloads to maintain intensity without signs/symptoms of physical distress. Continue to progress workloads to maintain intensity without signs/symptoms of physical distress. Continue to progress workloads to maintain  intensity without signs/symptoms of physical distress. Continue to  progress workloads to maintain intensity without signs/symptoms of physical distress.     Resistance Training   Training Prescription _0    Weight _1    Reps 10-15 10-15 10-15 10-15 10-15   Time 10 Minutes 10 Minutes 10 Minutes 10 Minutes 10 Minutes     Interval Training   Interval Training _2      Bike   Level 0.5 0.5  - 1 1.4   Minutes 17 17  - 17 17     NuStep   Level _3 Minutes _4 METs 1.9 2.4 2.6  - 2.1     Track   Laps  - _5 -   Minutes  - _6 -     Exercise Review   Progression  -  - Yes  -  -   Row Name 08/08/16 1500 08/10/16 1600           Response to Exercise   Blood Pressure (Admit) 124/74 100/72      Blood Pressure (Exercise) 112/66 128/80      Blood Pressure (Exit) 107/75 110/80      Heart Rate (Admit) 75 bpm 76 bpm      Heart Rate (Exercise) 122 bpm 111 bpm      Heart Rate (Exit) 76 bpm 100 bpm      Oxygen Saturation (Admit) 98 % 96 %      Oxygen Saturation (Exercise) 95 % 95 %      Oxygen Saturation (Exit) 95 % 94 %      Rating of Perceived Exertion (Exercise) 15 13      Perceived Dyspnea (Exercise) 3 3      Duration Progress to 45 minutes of aerobic exercise without signs/symptoms of physical distress Progress to 45 minutes of aerobic exercise without signs/symptoms of physical distress      Intensity THRR unchanged THRR unchanged        Progression   Progression Continue to progress workloads to maintain intensity without signs/symptoms of physical distress. Continue to progress workloads to maintain intensity without signs/symptoms of physical distress.        Resistance Training   Training Prescription Yes Yes      Weight orange bands orange bands      Reps 10-15 10-15      Time 10 Minutes 10 Minutes        Interval Training   Interval Training No No        Treadmill   MPH  - 1.7      Grade  - 0      Minutes  - 30         Bike   Level 1.4  -      Minutes 17  -        NuStep   Level 5  -      Minutes 17  -      METs 2.2  -        Track   Laps 15  -      Minutes 17  -         Exercise Comments:     Exercise Comments    Row Name 03/02/16 1002 04/04/16 0826 06/26/16 0911 07/11/16 1642     Exercise Comments  Patient has only attended three exercise sessions. Will cont. to monitor.  Patient walkes up to 16 laps in 15 minutes. MET level places her at a low level. Rates her level of exertion at a low level. Will cont. to monitor and progress.  patient hopes to be able to meet [facility] exercise requirements needed for transplant Home exercise completed       Exercise Goals and Review:     Exercise Goals    Row Name 06/26/16 0911             Exercise Goals   Increase Physical Activity Yes       Intervention Provide advice, education, support and counseling about physical activity/exercise needs.;Develop an individualized exercise prescription for aerobic and resistive training based on initial evaluation findings, risk stratification, comorbidities and participant's personal goals.       Expected Outcomes Achievement of increased cardiorespiratory fitness and enhanced flexibility, muscular endurance and strength shown through measurements of functional capacity and personal statement of participant.       Increase Strength and Stamina Yes       Intervention Provide advice, education, support and counseling about physical activity/exercise needs.;Develop an individualized exercise prescription for aerobic and resistive training based on initial evaluation findings, risk stratification, comorbidities and participant's personal goals.       Expected Outcomes Achievement of increased cardiorespiratory fitness and enhanced flexibility, muscular endurance and strength shown through measurements of functional capacity and personal statement of participant.          Exercise Goals Re-Evaluation :      Exercise Goals Re-Evaluation    Row Name 07/17/16 1252 08/14/16 1142           Exercise Goal Re-Evaluation   Exercise Goals Review Increase Physical Activity;Increase Strenth and Stamina Increase Strenth and Stamina;Increase Physical Activity      Comments Patient has only attended four exercise sessions. Will cont. to monitor and progress as appropriate.  Patient is progressing well in program. She has graduated from the walking track to the treadmill. We are now focusing more on the [facility] protocol for her goals. Her rate of perceived exertion is always "somewhat hard" to "hard". Will cont to monitor and progress.      Expected Outcomes Through exercising at rehab and at home, the patient will be able to increase strength and stamina which will make ADL's easier to preform. Through exercising at rehab and at home, patient will increase physical strength and stamina and find ADL's easier to perform.          Discharge Exercise Prescription (Final Exercise Prescription Changes):     Exercise Prescription Changes - 08/10/16 1600      Response to Exercise   Blood Pressure (Admit) 100/72   Blood Pressure (Exercise) 128/80   Blood Pressure (Exit) 110/80   Heart Rate (Admit) 76 bpm   Heart Rate (Exercise) 111 bpm   Heart Rate (Exit) 100 bpm   Oxygen Saturation (Admit) 96 %   Oxygen Saturation (Exercise) 95 %   Oxygen Saturation (Exit) 94 %   Rating of Perceived Exertion (Exercise) 13   Perceived Dyspnea (Exercise) 3   Duration Progress to 45 minutes of aerobic exercise without signs/symptoms of physical distress   Intensity THRR unchanged     Progression   Progression Continue to progress workloads to maintain intensity without signs/symptoms of physical distress.     Resistance Training   Training Prescription Yes   Weight orange bands   Reps 10-15  Time 10 Minutes     Interval Training   Interval Training No     Treadmill   MPH 1.7   Grade 0   Minutes 30       Nutrition:  Target Goals: Understanding of nutrition guidelines, daily intake of sodium <157m, cholesterol <2049m calories 30% from fat and 7% or less from saturated fats, daily to have 5 or more servings of fruits and vegetables.  Biometrics:     Pre Biometrics - 06/26/16 0912      Pre Biometrics   Grip Strength 20 kg       Nutrition Therapy Plan and Nutrition Goals:     Nutrition Therapy & Goals - 04/13/16 1512      Nutrition Therapy   Diet Therapeutic Lifestyle Changes     Personal Nutrition Goals   Nutrition Goal Pt to maintain her UBW around 128-135 lb   Personal Goal #2 Decrease consumption of simple carbs (e.g. Coke, sweet tea, desserts)     Intervention Plan   Intervention Prescribe, educate and counsel regarding individualized specific dietary modifications aiming towards targeted core components such as weight, hypertension, lipid management, diabetes, heart failure and other comorbidities.   Expected Outcomes Short Term Goal: Understand basic principles of dietary content, such as calories, fat, sodium, cholesterol and nutrients.;Long Term Goal: Adherence to prescribed nutrition plan.      Nutrition Discharge: Rate Your Plate Scores:     Nutrition Assessments - 04/13/16 1510      Rate Your Plate Scores   Pre Score 38      Nutrition Goals Re-Evaluation:     Nutrition Goals Re-Evaluation    Row Name 05/10/16 1331             Goals   Current Weight 130 lb 15.3 oz (59.4 kg)       Nutrition Goal Pt to maintain her UBW around 128-135 lb       Comment Wt goal met.  No new information available to assess simple carb intake         Personal Goal #2 Re-Evaluation   Personal Goal #2 Decrease consumption of simple carbs (e.g. Coke, sweet tea, desserts)          Nutrition Goals Discharge (Final Nutrition Goals Re-Evaluation):     Nutrition Goals Re-Evaluation - 05/10/16 1331      Goals   Current Weight 130 lb 15.3 oz (59.4 kg)   Nutrition  Goal Pt to maintain her UBW around 128-135 lb   Comment Wt goal met.  No new information available to assess simple carb intake     Personal Goal #2 Re-Evaluation   Personal Goal #2 Decrease consumption of simple carbs (e.g. Coke, sweet tea, desserts)      Psychosocial: Target Goals: Acknowledge presence or absence of significant depression and/or stress, maximize coping skills, provide positive support system. Participant is able to verbalize types and ability to use techniques and skills needed for reducing stress and depression.  Initial Review & Psychosocial Screening:     Initial Psych Review & Screening - 06/26/16 0929      Initial Review   Current issues with None Identified     Family Dynamics   Good Support System? Yes     Barriers   Psychosocial barriers to participate in program There are no identifiable barriers or psychosocial needs.;The patient should benefit from training in stress management and relaxation.     Screening Interventions   Interventions Encouraged to exercise  Quality of Life Scores:   PHQ-9: Recent Review Flowsheet Data    Depression screen [facility] 2/9 06/26/2016 02/07/2016   Decreased Interest 0 1   Down, Depressed, Hopeless 0 0   PHQ - 2 Score 0 1     Interpretation of Total Score  Total Score Depression Severity:  1-4 = Minimal depression, 5-9 = Mild depression, 10-14 = Moderate depression, 15-19 = Moderately severe depression, 20-27 = Severe depression   Psychosocial Evaluation and Intervention:     Psychosocial Evaluation - 06/26/16 0930      Psychosocial Evaluation & Interventions   Interventions Encouraged to exercise with the program and follow exercise prescription   Continue Psychosocial Services  No Follow up required      Psychosocial Re-Evaluation:     Psychosocial Re-Evaluation    [place] Name 03/02/16 1111 04/03/16 1441 07/18/16 0842 08/15/16 0955       Psychosocial Re-Evaluation   Current issues with  -  - None  Identified None Identified    Comments No psychosocial issues identified No psychosocial issues identified  -  -    Interventions Encouraged to attend Pulmonary Rehabilitation for the exercise Encouraged to attend Pulmonary Rehabilitation for the exercise Encouraged to attend Cardiac Rehabilitation for the exercise Encouraged to attend Pulmonary Rehabilitation for the exercise    Continue Psychosocial Services  No No No Follow up required No Follow up required       Psychosocial Discharge (Final Psychosocial Re-Evaluation):     Psychosocial Re-Evaluation - 08/15/16 0955      Psychosocial Re-Evaluation   Current issues with None Identified   Interventions Encouraged to attend Pulmonary Rehabilitation for the exercise   Continue Psychosocial Services  No Follow up required      Education: Education Goals: Education classes will be provided on a weekly basis, covering required topics. Participant will state understanding/return demonstration of topics presented.  Learning Barriers/Preferences:     Learning Barriers/Preferences - 06/26/16 0924      Learning Barriers/Preferences   Learning Barriers None   Learning Preferences Group Instruction;Written Material;Individual Instruction      Education Topics: Risk Factor Reduction:  -Group instruction that is supported by a PowerPoint presentation. Instructor discusses the definition of a risk factor, different risk factors for pulmonary disease, and how the heart and lungs work together.     Nutrition for Pulmonary Patient:  -Group instruction provided by PowerPoint slides, verbal discussion, and written materials to support subject matter. The instructor gives an explanation and review of healthy diet recommendations, which includes a discussion on weight management, recommendations for fruit and vegetable consumption, as well as protein, fluid, caffeine, fiber, sodium, sugar, and alcohol. Tips for eating when patients are short of  breath are discussed.   Pursed Lip Breathing:  -Group instruction that is supported by demonstration and informational handouts. Instructor discusses the benefits of pursed lip and diaphragmatic breathing and detailed demonstration on how to preform both.     Oxygen Safety:  -Group instruction provided by PowerPoint, verbal discussion, and written material to support subject matter. There is an overview of "What is Oxygen" and "Why do we need it".  Instructor also reviews how to create a safe environment for oxygen use, the importance of using oxygen as prescribed, and the risks of noncompliance. There is a brief discussion on traveling with oxygen and resources the patient may utilize.   Oxygen Equipment:  -Group instruction provided by [facility] Staff utilizing handouts, written materials, and equipment demonstrations.   PULMONARY REHAB  OTHER RESPIRATORY from 08/10/2016 in [place]  Date  03/16/16  Educator  [facility]  Instruction Review Code  2- meets goals/outcomes      Signs and Symptoms:  -Group instruction provided by written material and verbal discussion to support subject matter. Warning signs and symptoms of infection, stroke, and heart attack are reviewed and when to call the physician/911 reinforced. Tips for preventing the spread of infection discussed.   PULMONARY REHAB OTHER RESPIRATORY from 08/10/2016 in [place]  Date  07/13/16  Educator  rn  Instruction Review Code  2- meets goals/outcomes      Advanced Directives:  -Group instruction provided by verbal instruction and written material to support subject matter. Instructor reviews Advanced Directive laws and proper instruction for filling out document.   Pulmonary Video:  -Group video education that reviews the importance of medication and oxygen compliance, exercise, good nutrition, pulmonary hygiene, and pursed lip and diaphragmatic breathing for  the pulmonary patient.   Exercise for the Pulmonary Patient:  -Group instruction that is supported by a PowerPoint presentation. Instructor discusses benefits of exercise, core components of exercise, frequency, duration, and intensity of an exercise routine, importance of utilizing pulse oximetry during exercise, safety while exercising, and options of places to exercise outside of rehab.     Pulmonary Medications:  -Verbally interactive group education provided by instructor with focus on inhaled medications and proper administration.   PULMONARY REHAB OTHER RESPIRATORY from 08/10/2016 in [place]  Date  07/27/16  Educator  [facility]  Instruction Review Code  2- meets goals/outcomes      Anatomy and Physiology of the Respiratory System and Intimacy:  -Group instruction provided by PowerPoint, verbal discussion, and written material to support subject matter. Instructor reviews respiratory cycle and anatomical components of the respiratory system and their functions. Instructor also reviews differences in obstructive and restrictive respiratory diseases with examples of each. Intimacy, Sex, and Sexuality differences are reviewed with a discussion on how relationships can change when diagnosed with pulmonary disease. Common sexual concerns are reviewed.   PULMONARY REHAB OTHER RESPIRATORY from 08/10/2016 in [place]  Date  02/24/16  Educator  rn  Instruction Review Code  2- meets goals/outcomes      MD DAY -A group question and answer session with a medical doctor that allows participants to ask questions that relate to their pulmonary disease state.   OTHER EDUCATION -Group or individual verbal, written, or video instructions that support the educational goals of the pulmonary rehab program.   Knowledge Questionnaire Score:     Knowledge Questionnaire Score - 07/19/16 1203      Knowledge Questionnaire Score   Pre  Score 11/13      Core Components/Risk Factors/Patient Goals at Admission:     Personal Goals and Risk Factors at Admission - 06/26/16 0927      Core Components/Risk Factors/Patient Goals on Admission    Weight Management Yes;Weight Loss   Intervention Weight Management: Develop a combined nutrition and exercise program designed to reach desired caloric intake, while maintaining appropriate intake of nutrient and fiber, sodium and fats, and appropriate energy expenditure required for the weight goal.;Weight Management: Provide education and appropriate resources to help participant work on and attain dietary goals.   Expected Outcomes Short Term: Continue to assess and modify interventions until short term weight is achieved   Improve shortness of breath with ADL's Yes   Intervention Provide  education, individualized exercise plan and daily activity instruction to help decrease symptoms of SOB with activities of daily living.   Expected Outcomes Short Term: Achieves a reduction of symptoms when performing activities of daily living.   Heart Failure Yes   Intervention Provide a combined exercise and nutrition program that is supplemented with education, support and counseling about heart failure. Directed toward relieving symptoms such as shortness of breath, decreased exercise tolerance, and extremity edema.   Expected Outcomes Improve functional capacity of life;Short term: Attendance in program 2-3 days a week with increased exercise capacity. Reported lower sodium intake. Reported increased fruit and vegetable intake. Reports medication compliance.;Short term: Daily weights obtained and reported for increase. Utilizing diuretic protocols set by physician.;Long term: Adoption of self-care skills and reduction of barriers for early signs and symptoms recognition and intervention leading to self-care maintenance.      Core Components/Risk Factors/Patient Goals Review:      Goals and Risk  Factor Review    Row Name 03/02/16 1109 04/03/16 1439 07/18/16 0839 08/15/16 0952       Core Components/Risk Factors/Patient Goals Review   Personal Goals Review Develop more efficient breathing techniques such as purse lipped breathing and diaphragmatic breathing and practicing self-pacing with activity.;Improve shortness of breath with ADL's Develop more efficient breathing techniques such as purse lipped breathing and diaphragmatic breathing and practicing self-pacing with activity.;Improve shortness of breath with ADL's Heart Failure;Improve shortness of breath with ADL's;Weight Management/Obesity Improve shortness of breath with ADL's;Heart Failure;Weight Management/Obesity    Review has attended 3 exercise sessions, too early to see improvement absent for 1 week d/t cardiac catheterization with minimal CAD results, slowly progressing Just started back in program, has attended 4 exercise sessions, maintaining heart failure well, weight stable, seems to have more energy than last enrollment in program  heart failure controlled, weight stable, progressing well, set up at [facility] for lung transplant when she requires oxygen, presently not on oxygen    Expected Outcomes expect to see progress in the next 30 days will continue to gain strength and stamina as workloads increase Progress workloads as tolerated, monitor for signs of heart failure, encourage PLB and home exercise continue with previous plan       Core Components/Risk Factors/Patient Goals at Discharge (Final Review):      Goals and Risk Factor Review - 08/15/16 0952      Core Components/Risk Factors/Patient Goals Review   Personal Goals Review Improve shortness of breath with ADL's;Heart Failure;Weight Management/Obesity   Review heart failure controlled, weight stable, progressing well, set up at [facility] for lung transplant when she requires oxygen, presently not on oxygen   Expected Outcomes continue with previous plan      ITP  Comments:   Comments: ITP REVIEW Pt is making expected progress toward pulmonary rehab goals after completing 10 sessions. Recommend continued exercise, life style modification, education, and utilization of breathing techniques to increase stamina and strength and decrease shortness of breath with exertion.

## 2016-08-15 NOTE — Progress Notes (Signed)
Daily Session Note  Patient Details  Name: [name] [name] MRN: [id number] Date of Birth: 15-Jun-1950 Referring Provider:     Pulmonary Rehab Walk Test from 06/27/2016 in [place]  Referring Provider  Dr. Nelda Marseille      Encounter Date: 08/15/2016  Check In:     Session Check In - 08/15/16 1330      Check-In   Location [facility]   Staff Present Trish Fountain, RN, BSN;Molly diVincenzo, MS, ACSM RCEP, Exercise Physiologist;Lisa Ysidro Evert, RN   Supervising physician immediately available to respond to emergencies [facility] immediately available   Physician(s) Dr. Broadus John   Medication changes reported     No   Fall or balance concerns reported    No   Comments mild intermittant balance problem. patient has cane available to use when she feels unsteady   Tobacco Cessation No Change   Warm-up and Cool-down Performed as group-led instruction   Resistance Training Performed Yes   VAD Patient? No     Pain Assessment   Currently in Pain? No/denies   Multiple Pain Sites No      Capillary Blood Glucose: No results found for this or any previous visit (from the past 24 hour(s)).      Exercise Prescription Changes - 08/15/16 1540      Response to Exercise   Blood Pressure (Admit) 112/74   Blood Pressure (Exercise) 100/70   Blood Pressure (Exit) 110/73   Heart Rate (Admit) 69 bpm   Heart Rate (Exercise) 97 bpm   Heart Rate (Exit) 81 bpm   Oxygen Saturation (Admit) 97 %   Oxygen Saturation (Exercise) 94 %   Oxygen Saturation (Exit) 95 %   Rating of Perceived Exertion (Exercise) 13   Perceived Dyspnea (Exercise) 2   Duration Progress to 45 minutes of aerobic exercise without signs/symptoms of physical distress   Intensity THRR unchanged     Progression   Progression Continue to progress workloads to maintain intensity without signs/symptoms of physical distress.     Resistance Training   Training Prescription Yes   Weight  orange bands   Reps 10-15   Time 10 Minutes     Interval Training   Interval Training No     Treadmill   MPH 2   Grade 0   Minutes 30     Bike   Level 1   Minutes 17     NuStep   Level --   Minutes --     Track   Laps --   Minutes --      History  Smoking Status  . Former Smoker  . Quit date: 03/13/1996  Smokeless Tobacco  . Never Used    Goals Met:  Independence with exercise equipment Improved SOB with ADL's Using PLB without cueing & demonstrates good technique Exercise tolerated well No report of cardiac concerns or symptoms Strength training completed today  Goals Unmet:  Not Applicable  Comments: Service time is from 1330 to 1500   Dr. Rush Farmer is Medical Director for Pulmonary Rehab at [facility].

## 2016-08-17 ENCOUNTER — Encounter (HOSPITAL_COMMUNITY)
Admission: RE | Admit: 2016-08-17 | Discharge: 2016-08-17 | Disposition: A | Discharge status: Home / Self Care | Payer: Medicare Other | Source: Ambulatory Visit | Attending: Pulmonary Disease | Admitting: Pulmonary Disease

## 2016-08-17 VITALS — Wt 139.6 lb

## 2016-08-17 DIAGNOSIS — J8410 Pulmonary fibrosis, unspecified: Secondary | ICD-10-CM | POA: Diagnosis not present

## 2016-08-17 NOTE — Progress Notes (Signed)
Daily Session Note  Patient Details  Name: [name] [name] MRN: [id number] Date of Birth: 1951-02-26 Referring Provider:     Pulmonary Rehab Walk Test from 06/27/2016 in [place]  Referring Provider  Dr. Nelda Marseille      Encounter Date: 08/17/2016  Check In:     Session Check In - 08/17/16 1330      Check-In   Location [facility]   Staff Present Rosebud Poles, RN, BSN;Molly diVincenzo, MS, ACSM RCEP, Exercise Physiologist;Lisa Ysidro Evert, RN   Supervising physician immediately available to respond to emergencies [facility] immediately available   Physician(s) Dr. Posey Pronto   Medication changes reported     No   Fall or balance concerns reported    No   Comments mild intermittant balance problem. patient has cane available to use when she feels unsteady   Tobacco Cessation No Change   Warm-up and Cool-down Performed as group-led instruction   Resistance Training Performed Yes   VAD Patient? No     Pain Assessment   Currently in Pain? No/denies   Multiple Pain Sites No      Capillary Blood Glucose: No results found for this or any previous visit (from the past 24 hour(s)).      Exercise Prescription Changes - 08/17/16 1523      Response to Exercise   Blood Pressure (Admit) 104/60   Blood Pressure (Exercise) 102/60   Blood Pressure (Exit) 90/50   Heart Rate (Admit) 74 bpm   Heart Rate (Exercise) 124 bpm   Heart Rate (Exit) 96 bpm   Oxygen Saturation (Admit) 96 %   Oxygen Saturation (Exercise) 94 %   Oxygen Saturation (Exit) 91 %   Rating of Perceived Exertion (Exercise) 13   Perceived Dyspnea (Exercise) 3   Duration Progress to 45 minutes of aerobic exercise without signs/symptoms of physical distress   Intensity THRR unchanged     Progression   Progression Continue to progress workloads to maintain intensity without signs/symptoms of physical distress.     Resistance Training   Training Prescription Yes   Weight  orange bands   Reps 10-15   Time 10 Minutes     Interval Training   Interval Training No     Treadmill   MPH 2   Grade 0   Minutes 30     Bike   Level 1   Minutes 17      History  Smoking Status  . Former Smoker  . Quit date: 03/13/1996  Smokeless Tobacco  . Never Used    Goals Met:  Improved SOB with ADL's Exercise tolerated well No report of cardiac concerns or symptoms Strength training completed today  Goals Unmet:  Not Applicable  Comments: Service time is from 1330 to 1450   Dr. Rush Farmer is Medical Director for Pulmonary Rehab at [facility].

## 2016-08-22 ENCOUNTER — Encounter (HOSPITAL_COMMUNITY)
Admission: RE | Admit: 2016-08-22 | Discharge: 2016-08-22 | Disposition: A | Discharge status: Home / Self Care | Payer: Medicare Other | Source: Ambulatory Visit | Attending: Pulmonary Disease | Admitting: Pulmonary Disease

## 2016-08-22 VITALS — Wt 141.5 lb

## 2016-08-22 DIAGNOSIS — J8410 Pulmonary fibrosis, unspecified: Secondary | ICD-10-CM

## 2016-08-22 NOTE — Progress Notes (Signed)
Daily Session Note  Patient Details  Name: [name] [name] MRN: [id number] Date of Birth: 04-18-1950 Referring Provider:     Pulmonary Rehab Walk Test from 06/27/2016 in [place]  Referring Provider  Dr. Nelda Marseille      Encounter Date: 08/22/2016  Check In:     Session Check In - 08/22/16 1330      Check-In   [facility]   Staff Present Rosebud Poles, RN, BSN;Molly diVincenzo, MS, ACSM RCEP, Exercise Physiologist;Kyrie Bun Rollene Rotunda, RN, BSN   Supervising physician immediately available to respond to emergencies [facility] immediately available   Physician(s) Dr. Posey Pronto   Medication changes reported     No   Fall or balance concerns reported    No   Tobacco Cessation No Change   Warm-up and Cool-down Performed as group-led instruction   Resistance Training Performed Yes   VAD Patient? No     Pain Assessment   Currently in Pain? No/denies   Multiple Pain Sites No      Capillary Blood Glucose: No results found for this or any previous visit (from the past 24 hour(s)).      Exercise Prescription Changes - 08/22/16 1638      Response to Exercise   Blood Pressure (Admit) 114/64   Blood Pressure (Exercise) 130/70   Blood Pressure (Exit) 100/66   Heart Rate (Admit) 76 bpm   Heart Rate (Exercise) 131 bpm   Heart Rate (Exit) 88 bpm   Oxygen Saturation (Admit) 96 %   Oxygen Saturation (Exercise) 94 %   Oxygen Saturation (Exit) 92 %   Rating of Perceived Exertion (Exercise) 15   Perceived Dyspnea (Exercise) 3   Duration Progress to 45 minutes of aerobic exercise without signs/symptoms of physical distress   Intensity THRR unchanged     Progression   Progression Continue to progress workloads to maintain intensity without signs/symptoms of physical distress.     Resistance Training   Training Prescription Yes   Weight orange bands   Reps 10-15   Time 10 Minutes     Interval Training   Interval Training No     Treadmill   MPH 2   Grade 0   Minutes 30     Bike   Level 1.5   Minutes 17      History  Smoking Status  . Former Smoker  . Quit date: 03/13/1996  Smokeless Tobacco  . Never Used    Goals Met:  Improved SOB with ADL's Using PLB without cueing & demonstrates good technique Exercise tolerated well No report of cardiac concerns or symptoms Strength training completed today  Goals Unmet:  Not Applicable  Comments: Service time is from 1330 to 1500   Dr. Rush Farmer is Medical Director for Pulmonary Rehab at [facility].

## 2016-08-24 ENCOUNTER — Encounter (HOSPITAL_COMMUNITY)
Admission: RE | Admit: 2016-08-24 | Discharge: 2016-08-24 | Disposition: A | Discharge status: Home / Self Care | Payer: Medicare Other | Source: Ambulatory Visit | Attending: Pulmonary Disease | Admitting: Pulmonary Disease

## 2016-08-24 VITALS — Wt 139.3 lb

## 2016-08-24 DIAGNOSIS — J8410 Pulmonary fibrosis, unspecified: Secondary | ICD-10-CM

## 2016-08-24 NOTE — Progress Notes (Signed)
Daily Session Note  Patient Details  Name: [name] [name] MRN: [id number] Date of Birth: 1950/08/24 Referring Provider:     Pulmonary Rehab Walk Test from 06/27/2016 in [place]  Referring Provider  Dr. Nelda Marseille      Encounter Date: 08/24/2016  Check In:     Session Check In - 08/24/16 1341      Check-In   Location [facility]   Staff Present Su Hilt, MS, ACSM RCEP, Exercise Physiologist;Moria Brophy Rollene Rotunda, RN, BSN   Supervising physician immediately available to respond to emergencies [facility] immediately available   Physician(s) Dr. Sloan Leiter   Medication changes reported     No   Fall or balance concerns reported    No   Tobacco Cessation No Change   Warm-up and Cool-down Performed as group-led instruction   Resistance Training Performed Yes   VAD Patient? No     Pain Assessment   Currently in Pain? No/denies   Multiple Pain Sites No      Capillary Blood Glucose: No results found for this or any previous visit (from the past 24 hour(s)).      Exercise Prescription Changes - 08/24/16 1557      Response to Exercise   Blood Pressure (Admit) 114/66   Blood Pressure (Exercise) 100/64   Blood Pressure (Exit) 100/70   Heart Rate (Admit) 80 bpm   Heart Rate (Exercise) 140 bpm   Heart Rate (Exit) 93 bpm   Oxygen Saturation (Admit) 98 %   Oxygen Saturation (Exercise) 94 %   Oxygen Saturation (Exit) 96 %   Rating of Perceived Exertion (Exercise) 15   Perceived Dyspnea (Exercise) 3   Duration Progress to 45 minutes of aerobic exercise without signs/symptoms of physical distress   Intensity THRR unchanged     Progression   Progression Continue to progress workloads to maintain intensity without signs/symptoms of physical distress.     Resistance Training   Training Prescription Yes   Weight orange bands   Reps 10-15   Time 10 Minutes     Interval Training   Interval Training No     Treadmill   MPH 2.3    Grade 3   Minutes 17     Bike   Level 1   Minutes 17      History  Smoking Status  . Former Smoker  . Quit date: 03/13/1996  Smokeless Tobacco  . Never Used    Goals Met:  Improved SOB with ADL's Using PLB without cueing & demonstrates good technique Exercise tolerated well No report of cardiac concerns or symptoms Strength training completed today  Goals Unmet:  Not Applicable  Comments: Service time is from 1330 to 1530   Dr. Rush Farmer is Medical Director for Pulmonary Rehab at [facility].

## 2016-08-29 ENCOUNTER — Encounter (HOSPITAL_COMMUNITY)
Admission: RE | Admit: 2016-08-29 | Discharge: 2016-08-29 | Disposition: A | Discharge status: Home / Self Care | Payer: Medicare Other | Source: Ambulatory Visit | Attending: Pulmonary Disease | Admitting: Pulmonary Disease

## 2016-08-29 VITALS — Wt 139.8 lb

## 2016-08-29 DIAGNOSIS — J8410 Pulmonary fibrosis, unspecified: Secondary | ICD-10-CM

## 2016-08-29 NOTE — Progress Notes (Signed)
Daily Session Note  Patient Details  Name: [name] [name] MRN: [id number] Date of Birth: 04/24/1950 Referring Provider:     Pulmonary Rehab Walk Test from 06/27/2016 in [facility]  Referring Provider  Dr. Yacoub      Encounter Date: 08/29/2016  Check In:     Session Check In - 08/29/16 1330      Check-In   Location [facility]   Staff Present Joan Behrens, RN, BSN;Molly diVincenzo, MS, ACSM RCEP, Exercise Physiologist; , RN, BSN   Supervising physician immediately available to respond to emergencies [facility] immediately available   Physician(s) Dr. Abrol   Medication changes reported     No   Fall or balance concerns reported    No   Tobacco Cessation No Change   Warm-up and Cool-down Performed as group-led instruction   Resistance Training Performed Yes   VAD Patient? No     Pain Assessment   Currently in Pain? No/denies   Multiple Pain Sites No      Capillary Blood Glucose: No results found for this or any previous visit (from the past 24 hour(s)).      Exercise Prescription Changes - 08/29/16 1531      Response to Exercise   Blood Pressure (Admit) 108/66   Blood Pressure (Exit) 90/52  86/40, H2O given, see recheck   Heart Rate (Admit) 83 bpm   Heart Rate (Exercise) 133 bpm   Heart Rate (Exit) 95 bpm   Oxygen Saturation (Admit) 97 %   Oxygen Saturation (Exercise) 95 %   Oxygen Saturation (Exit) 95 %   Rating of Perceived Exertion (Exercise) 14   Perceived Dyspnea (Exercise) 3   Duration Progress to 45 minutes of aerobic exercise without signs/symptoms of physical distress   Intensity THRR unchanged     Progression   Progression Continue to progress workloads to maintain intensity without signs/symptoms of physical distress.     Resistance Training   Training Prescription Yes   Weight orange bands   Reps 10-15   Time 10 Minutes     Interval Training   Interval Training No     Treadmill   MPH 2   Grade 0   Minutes 17     Bike   Level 1.5   Minutes 17      History  Smoking Status  . Former Smoker  . Quit date: 03/13/1996  Smokeless Tobacco  . Never Used    Goals Met:  Using PLB without cueing & demonstrates good technique No report of cardiac concerns or symptoms Strength training completed today  Goals Unmet:  RPE  Comments: Service time is from 1330 to 1500   Dr. Wesam G. Yacoub is Medical Director for Pulmonary Rehab at [facility]. 

## 2016-08-31 ENCOUNTER — Encounter (HOSPITAL_COMMUNITY)
Admission: RE | Admit: 2016-08-31 | Discharge: 2016-08-31 | Disposition: A | Discharge status: Home / Self Care | Payer: Medicare Other | Source: Ambulatory Visit | Attending: Pulmonary Disease | Admitting: Pulmonary Disease

## 2016-08-31 VITALS — Wt 141.1 lb

## 2016-08-31 DIAGNOSIS — J8410 Pulmonary fibrosis, unspecified: Secondary | ICD-10-CM | POA: Diagnosis not present

## 2016-08-31 NOTE — Progress Notes (Signed)
Daily Session Note  Patient Details  Name: [name] [name] MRN: [id number] Date of Birth: [birth year] Referring Provider:     Pulmonary Rehab Walk Test from 06/27/2016 in [place]  Referring Provider  Dr. Nelda Marseille      Encounter Date: 08/31/2016  Check In:     Session Check In - 08/31/16 1347      Check-In   Location [facility]   Staff Present Su Hilt, MS, ACSM RCEP, Exercise Physiologist;Joan Leonia Reeves, RN, Luisa Hart, RN, BSN   Supervising physician immediately available to respond to emergencies [facility] immediately available   Physician(s) Dr. Allyson Sabal   Medication changes reported     No   Fall or balance concerns reported    No   Tobacco Cessation No Change   Warm-up and Cool-down Performed as group-led instruction   Resistance Training Performed Yes   VAD Patient? No     Pain Assessment   Currently in Pain? No/denies   Multiple Pain Sites No      Capillary Blood Glucose: No results found for this or any previous visit (from the past 24 hour(s)).      Exercise Prescription Changes - 08/31/16 1557      Response to Exercise   Blood Pressure (Admit) 98/50   Blood Pressure (Exercise) 118/64   Blood Pressure (Exit) 96/54  86/40, H2O given, see recheck   Heart Rate (Admit) 67 bpm   Heart Rate (Exercise) 87 bpm   Heart Rate (Exit) 76 bpm   Oxygen Saturation (Admit) 99 %   Oxygen Saturation (Exercise) 96 %   Oxygen Saturation (Exit) 97 %   Rating of Perceived Exertion (Exercise) 11   Perceived Dyspnea (Exercise) 2   Duration Progress to 45 minutes of aerobic exercise without signs/symptoms of physical distress   Intensity THRR unchanged     Progression   Progression Continue to progress workloads to maintain intensity without signs/symptoms of physical distress.     Resistance Training   Training Prescription Yes   Weight orange bands   Reps 10-15   Time 10 Minutes     Interval Training   Interval Training No     Treadmill   MPH 2.3   Grade 3   Minutes 34      History  Smoking Status  . Former Smoker  . Quit date: 03/13/1996  Smokeless Tobacco  . Never Used    Goals Met:  Independence with exercise equipment Improved SOB with ADL's Using PLB without cueing & demonstrates good technique Exercise tolerated well No report of cardiac concerns or symptoms Strength training completed today  Goals Unmet:  Not Applicable  Comments: Service time is from 1330 to 1520   Dr. Rush Farmer is Medical Director for Pulmonary Rehab at [facility]

## 2016-09-05 ENCOUNTER — Encounter (HOSPITAL_COMMUNITY)
Admission: RE | Admit: 2016-09-05 | Discharge: 2016-09-05 | Disposition: A | Discharge status: Home / Self Care | Payer: Medicare Other | Source: Ambulatory Visit | Attending: Pulmonary Disease | Admitting: Pulmonary Disease

## 2016-09-05 DIAGNOSIS — J8410 Pulmonary fibrosis, unspecified: Secondary | ICD-10-CM | POA: Diagnosis not present

## 2016-09-05 NOTE — Progress Notes (Signed)
Daily Session Note  Patient Details  Name: [name] [name] MRN: [id number] Date of Birth: 09/04/1950 Referring Provider:     Pulmonary Rehab Walk Test from 06/27/2016 in [place]  Referring Provider  Dr. Nelda Marseille      Encounter Date: 09/05/2016  Check In:     Session Check In - 09/05/16 1505      Check-In   Location [facility]   Staff Present Rosebud Poles, RN, BSN;Molly diVincenzo, MS, ACSM RCEP, Exercise Physiologist;Baili Stang Ysidro Evert, Felipe Drone, RN, [facility]   Supervising physician immediately available to respond to emergencies [facility] immediately available   Physician(s) Dr. Allyson Sabal   Medication changes reported     No   Fall or balance concerns reported    No   Tobacco Cessation No Change   Warm-up and Cool-down Performed as group-led instruction   Resistance Training Performed Yes   VAD Patient? No     Pain Assessment   Currently in Pain? No/denies   Multiple Pain Sites No      Capillary Blood Glucose: No results found for this or any previous visit (from the past 24 hour(s)).      Exercise Prescription Changes - 09/05/16 1500      Response to Exercise   Blood Pressure (Admit) 110/78   Blood Pressure (Exercise) 106/64   Blood Pressure (Exit) 124/79   Heart Rate (Admit) 75 bpm   Heart Rate (Exercise) 137 bpm   Heart Rate (Exit) 85 bpm   Oxygen Saturation (Admit) 96 %   Oxygen Saturation (Exercise) 93 %   Oxygen Saturation (Exit) 94 %   Rating of Perceived Exertion (Exercise) 15   Perceived Dyspnea (Exercise) 3   Duration Continue with 45 min of aerobic exercise without signs/symptoms of physical distress.   Intensity THRR unchanged     Progression   Progression Continue to progress workloads to maintain intensity without signs/symptoms of physical distress.     Resistance Training   Training Prescription Yes   Weight orange bands   Reps 10-15   Time 10 Minutes     Interval Training   Interval Training No     Treadmill   MPH 2   Grade 3   Minutes 34     Bike   Level 1.7   Minutes 17      History  Smoking Status  . Former Smoker  . Quit date: 03/13/1996  Smokeless Tobacco  . Never Used    Goals Met:  Exercise tolerated well No report of cardiac concerns or symptoms Strength training completed today  Goals Unmet:  Not Applicable  Comments: Service time is from 1330 to 1500    Dr. Rush Farmer is Medical Director for Pulmonary Rehab at [facility].

## 2016-09-07 ENCOUNTER — Encounter (HOSPITAL_COMMUNITY)
Admission: RE | Admit: 2016-09-07 | Discharge: 2016-09-07 | Disposition: A | Discharge status: Home / Self Care | Payer: Medicare Other | Source: Ambulatory Visit | Attending: Pulmonary Disease | Admitting: Pulmonary Disease

## 2016-09-07 VITALS — Wt 141.8 lb

## 2016-09-07 DIAGNOSIS — J8410 Pulmonary fibrosis, unspecified: Secondary | ICD-10-CM | POA: Diagnosis not present

## 2016-09-07 NOTE — Progress Notes (Signed)
Daily Session Note  Patient Details  Name: [name] [name] MRN: [id number] Date of Birth: April 05, 1950 Referring Provider:     Pulmonary Rehab Walk Test from 06/27/2016 in [place]  Referring Provider  Dr. Nelda Marseille      Encounter Date: 09/07/2016  Check In:     Session Check In - 09/07/16 1404      Check-In   Location [facility]   Staff Present Su Hilt, MS, ACSM RCEP, Exercise Physiologist;Shauntee Karp [name], RN, [place] physician immediately available to respond to emergencies [facility] immediately available   Physician(s) Dr. Allyson Sabal   Medication changes reported     No   Fall or balance concerns reported    No   Tobacco Cessation No Change   Warm-up and Cool-down Performed as group-led instruction   Resistance Training Performed Yes   VAD Patient? No     Pain Assessment   Currently in Pain? No/denies      Capillary Blood Glucose: No results found for this or any previous visit (from the past 24 hour(s)).      Exercise Prescription Changes - 09/07/16 1500      Response to Exercise   Blood Pressure (Admit) 104/66   Blood Pressure (Exercise) 128/64   Blood Pressure (Exit) 94/70   Heart Rate (Admit) 65 bpm   Heart Rate (Exercise) 100 bpm   Heart Rate (Exit) 77 bpm   Oxygen Saturation (Admit) 98 %   Oxygen Saturation (Exercise) 96 %   Oxygen Saturation (Exit) 94 %   Rating of Perceived Exertion (Exercise) 13   Perceived Dyspnea (Exercise) 3   Duration Progress to 45 minutes of aerobic exercise without signs/symptoms of physical distress   Intensity THRR unchanged     Progression   Progression Continue to progress workloads to maintain intensity without signs/symptoms of physical distress.     Resistance Training   Training Prescription Yes   Weight orange bands   Reps 10-15   Time 10 Minutes     Interval Training   Interval Training No     Bike   Level 1.5   Minutes  30      History  Smoking Status  . Former Smoker  . Quit date: 03/13/1996  Smokeless Tobacco  . Never Used    Goals Met:  Exercise tolerated well No report of cardiac concerns or symptoms Strength training completed today  Goals Unmet:  Not Applicable  Comments: Service time is from 1330 to 1505    Dr. Rush Farmer is Medical Director for Pulmonary Rehab at [facility].

## 2016-09-12 ENCOUNTER — Encounter (HOSPITAL_COMMUNITY)
Admission: RE | Admit: 2016-09-12 | Discharge: 2016-09-12 | Disposition: A | Discharge status: Home / Self Care | Payer: Medicare Other | Source: Ambulatory Visit | Attending: Pulmonary Disease | Admitting: Pulmonary Disease

## 2016-09-12 VITALS — Wt 140.9 lb

## 2016-09-12 DIAGNOSIS — J8410 Pulmonary fibrosis, unspecified: Secondary | ICD-10-CM | POA: Diagnosis present

## 2016-09-12 NOTE — Progress Notes (Signed)
Daily Session Note  Patient Details  Name: [name] [name] MRN: [id number] Date of Birth: 02/11/1951 Referring Provider:     Pulmonary Rehab Walk Test from 06/27/2016 in [place]  Referring Provider  Dr. Nelda Marseille      Encounter Date: 09/12/2016  Check In:     Session Check In - 09/12/16 1330      Check-In   Location [facility]   Staff Present Rosebud Poles, RN, BSN;Molly diVincenzo, MS, ACSM RCEP, Exercise Physiologist;Lisa Ysidro Evert, RN;Portia Rollene Rotunda, RN, BSN   Supervising physician immediately available to respond to emergencies [facility] immediately available   Physician(s) Dr. Allyson Sabal   Medication changes reported     No   Fall or balance concerns reported    No   Tobacco Cessation No Change   Warm-up and Cool-down Performed as group-led instruction   Resistance Training Performed Yes   VAD Patient? No     Pain Assessment   Currently in Pain? No/denies   Multiple Pain Sites No      Capillary Blood Glucose: No results found for this or any previous visit (from the past 24 hour(s)).      Exercise Prescription Changes - 09/12/16 1500      Response to Exercise   Blood Pressure (Admit) 96/70   Blood Pressure (Exercise) 120/70   Blood Pressure (Exit) 92/50   Heart Rate (Admit) 91 bpm   Heart Rate (Exercise) 132 bpm   Heart Rate (Exit) 98 bpm   Oxygen Saturation (Admit) 96 %   Oxygen Saturation (Exercise) 92 %   Oxygen Saturation (Exit) 98 %   Rating of Perceived Exertion (Exercise) 13   Perceived Dyspnea (Exercise) 3   Duration Progress to 45 minutes of aerobic exercise without signs/symptoms of physical distress   Intensity THRR unchanged     Progression   Progression Continue to progress workloads to maintain intensity without signs/symptoms of physical distress.     Resistance Training   Training Prescription Yes   Weight orange bands   Reps 10-15   Time 10 Minutes     Interval Training   Interval  Training No     Treadmill   MPH 2.3   Grade 2   Minutes 30     Bike   Level 1.5   Minutes 20      History  Smoking Status  . Former Smoker  . Quit date: 03/13/1996  Smokeless Tobacco  . Never Used    Goals Met:  Exercise tolerated well Strength training completed today  Goals Unmet:  Not Applicable  Comments: Service time is from 1330 to [place]    Dr. Rush Farmer is Medical Director for Pulmonary Rehab at [facility].

## 2016-09-12 NOTE — Progress Notes (Signed)
Pulmonary Individual Treatment Plan  Patient Details  Name: [name] [name] MRN: [id number] Date of Birth: 08/31/1950 Referring Provider:     Pulmonary Rehab Walk Test from 06/27/2016 in [facility]  Referring Provider  Dr. Nelda Marseille      Initial Encounter Date:    Pulmonary Rehab Walk Test from 06/27/2016 in [place]  Date  06/27/16  Referring Provider  Dr. Nelda Marseille      Visit Diagnosis: Pulmonary fibrosis ([place])  Patient's Home Medications on Admission:   Current Outpatient Prescriptions:  .  albuterol (PROVENTIL HFA;VENTOLIN HFA) 108 (90 Base) MCG/ACT inhaler, INHALE 2 PUFFS BY MOUTH AS DIRECTED AS NEEDED FOR SHORTNESS OF BREATH, Disp: , Rfl:  .  amitriptyline (ELAVIL) 50 MG tablet, Take 50 mg by mouth at bedtime.  , Disp: , Rfl:  .  aspirin EC 81 MG tablet, Take 1 tablet (81 mg total) by mouth daily., Disp: 90 tablet, Rfl: 3 .  budesonide-formoterol (SYMBICORT) 160-4.5 MCG/ACT inhaler, Inhale 2 puffs into the lungs 2 (two) times daily as needed. , Disp: , Rfl:  .  clobetasol cream (TEMOVATE) 0.96 %, Apply 1 application topically 2 (two) times daily as needed. APPLY TO AFFECTED FINGER 1 TO 2 TIMES D WITH AT LEAST 1 APPLICATION UNDER OCCLUSION, Disp: , Rfl:  .  conjugated estrogens (PREMARIN) vaginal cream, Place 2.83 Applicatorfuls vaginally 3 (three) times a week. Pt only to use 1/4 of the applicator, Disp: , Rfl:  .  docusate sodium (COLACE) 100 MG capsule, Take 100 mg by mouth daily as needed for mild constipation. , Disp: , Rfl:  .  emtricitabine-tenofovir (TRUVADA) 200-300 MG tablet, TAKE 1 TABLET BY MOUTH EVERY DAY, Disp: , Rfl:  .  isosorbide mononitrate (IMDUR) 30 MG 24 hr tablet, Take 1 tablet (30 mg total) by mouth daily., Disp: 30 tablet, Rfl: 12 .  nitroGLYCERIN (NITROSTAT) 0.4 MG SL tablet, Place 1 tablet (0.4 mg total) under the tongue every 5 (five) minutes as needed for chest pain., Disp: 30 tablet, Rfl: 12 .   Pirfenidone (ESBRIET) 267 MG CAPS, Take 2 capsules by mouth 2 (two) times daily., Disp: , Rfl:  .  polyethylene glycol (MIRALAX / GLYCOLAX) packet, Take 17 g by mouth daily as needed. , Disp: , Rfl:  .  pravastatin (PRAVACHOL) 80 MG tablet, Take 1 tablet by mouth daily., Disp: , Rfl:  .  raltegravir (ISENTRESS) 400 MG tablet, TAKE 1 TABLET BY MOUTH TWICE DAILY, Disp: , Rfl:  .  senna-docusate (SENOKOT-S) 8.6-50 MG tablet, Take 1 tablet by mouth daily as needed for mild constipation. , Disp: , Rfl:  .  sucralfate (CARAFATE) 1 g tablet, Take 1 g by mouth 2 (two) times daily. , Disp: , Rfl:   Past Medical History: Past Medical History:  Diagnosis Date  . Asthma   . Bradycardia    Sinus bradycardia  . CAD (coronary artery disease)    Minimal by history, Question coronary artery spasm  . Colon polyps   . Dyslipidemia   . Ejection fraction   . GERD (gastroesophageal reflux disease)    Esophageal stricture  . H/O: hysterectomy   . HIV positive ([place])    Needle stick 1993  . Hyperlipidemia   . Palpitations   . Peptic ulcer disease   . Shortness of breath    Extensive evaluation Dr. Winona Legato, [facility], May, 6629, etiology uncertain, probable bronchiolitis possibility of lung biopsy at that time  . Weight loss  Tobacco Use: History  Smoking Status  . Former Smoker  . Quit date: 03/13/1996  Smokeless Tobacco  . Never Used    Labs: Recent Review Flowsheet Data    Labs for ITP Cardiac and Pulmonary Rehab Latest Ref Rng & Units 03/21/2016 03/21/2016   PHART 7.350 - 7.450 - 7.336(L)   PCO2ART 32.0 - 48.0 mmHg - 36.5   HCO3 20.0 - 28.0 mmol/L 19.1(L) 19.5(L)   TCO2 0 - 100 mmol/L 20 21   ACIDBASEDEF 0.0 - 2.0 mmol/L 7.0(H) 6.0(H)   O2SAT % 73.0 99.0      Capillary Blood Glucose: Lab Results  Component Value Date   GLUCAP 87 04/10/2014     ADL UCSD:     Pulmonary Assessment Scores    Row Name 07/19/16 1203         ADL UCSD   ADL Phase Entry     SOB Score total 77        CAT Score   CAT Score 27  Pre        Pulmonary Function Assessment:     Pulmonary Function Assessment - 06/26/16 0924      Breath   Bilateral Breath Sounds Other   Other fine crackles throughout both bases   Shortness of Breath Yes;Limiting activity      Exercise Target Goals:    Exercise Program Goal: Individual exercise prescription set with THRR, safety & activity barriers. Participant demonstrates ability to understand and report RPE using BORG scale, to self-measure pulse accurately, and to acknowledge the importance of the exercise prescription.  Exercise Prescription Goal: Starting with aerobic activity 30 plus minutes a day, 3 days per week for initial exercise prescription. Provide home exercise prescription and guidelines that participant acknowledges understanding prior to discharge.  Activity Barriers & Risk Stratification:     Activity Barriers & Cardiac Risk Stratification - 06/26/16 0910      Activity Barriers & Cardiac Risk Stratification   Activity Barriers Shortness of Breath;Balance Concerns;Deconditioning      6 Minute Walk:     6 Minute Walk    Row Name 06/27/16 1632         6 Minute Walk   Phase Initial     Distance 1320 feet     Walk Time 6 minutes     # of Rest Breaks 0     MPH 2.5     METS 2.91     RPE 12     Perceived Dyspnea  3     Symptoms No     Resting HR 84 bpm     Resting BP 95/66     Max Ex. HR 98 bpm     Max Ex. BP 104/65       Interval HR   Baseline HR 84     1 Minute HR 87     2 Minute HR 98     3 Minute HR 96     4 Minute HR 95     5 Minute HR 92     6 Minute HR 93     2 Minute Post HR 85     Interval Heart Rate? Yes       Interval Oxygen   Interval Oxygen? Yes     Baseline Oxygen Saturation % 94 %     Baseline Liters of Oxygen 0 L     1 Minute Oxygen Saturation % 94 %     1 Minute Liters of Oxygen 0 L  2 Minute Oxygen Saturation % 94 %     2 Minute Liters of Oxygen 0 L     3 Minute Oxygen  Saturation % 94 %     3 Minute Liters of Oxygen 0 L     4 Minute Oxygen Saturation % 93 %     4 Minute Liters of Oxygen 0 L     5 Minute Oxygen Saturation % 94 %     5 Minute Liters of Oxygen 0 L     6 Minute Oxygen Saturation % 95 %     6 Minute Liters of Oxygen 0 L     2 Minute Post Oxygen Saturation % 97 %     2 Minute Post Liters of Oxygen 0 L        Oxygen Initial Assessment:     Oxygen Initial Assessment - 06/27/16 1642      Initial 6 min Walk   Oxygen Used None   Resting Oxygen Saturation  during 6 min walk 94 %   Exercise Oxygen Saturation  during 6 min walk 93 %     Program Oxygen Prescription   Program Oxygen Prescription None      Oxygen Re-Evaluation:     Oxygen Re-Evaluation    Row Name 07/18/16 0838 08/15/16 0952 09/12/16 0953         Program Oxygen Prescription   Program Oxygen Prescription None None None       Home Oxygen   Home Oxygen Device None None None     Sleep Oxygen Prescription None None None     Home Exercise Oxygen Prescription None None None     Home at Rest Exercise Oxygen Prescription None None None        Oxygen Discharge (Final Oxygen Re-Evaluation):     Oxygen Re-Evaluation - 09/12/16 0953      Program Oxygen Prescription   Program Oxygen Prescription None     Home Oxygen   Home Oxygen Device None   Sleep Oxygen Prescription None   Home Exercise Oxygen Prescription None   Home at Rest Exercise Oxygen Prescription None      Initial Exercise Prescription:     Initial Exercise Prescription - 06/27/16 1600      Date of Initial Exercise RX and Referring Provider   Date 06/27/16   Referring Provider Dr. Nelda Marseille     Bike   Level 0.5   Minutes 17     NuStep   Level 3   Minutes 17   METs 1.7     Track   Laps 10   Minutes 17     Prescription Details   Frequency (times per week) 2   Duration Progress to 45 minutes of aerobic exercise without signs/symptoms of physical distress     Intensity   THRR 40-80% of  Max Heartrate 62-125   Ratings of Perceived Exertion 11-13   Perceived Dyspnea 0-4     Progression   Progression Continue progressive overload as per policy without signs/symptoms or physical distress.     Resistance Training   Training Prescription Yes   Weight orange bands   Reps --  10 minutes of strength training      Perform Capillary Blood Glucose checks as needed.  Exercise Prescription Changes:     Exercise Prescription Changes    Row Name 03/16/16 1500 03/28/16 1531 04/04/16 1600 04/06/16 1600 04/11/16 1500     Response to Exercise   Blood Pressure (Admit) 112/60 102/60 102/66  112/64 104/60   Blood Pressure (Exercise) 96/64 100/62 110/50 100/58 94/50   Blood Pressure (Exit) 100/64 98/60 100/56 104/64 98/64   Heart Rate (Admit) 68 bpm 81 bpm 92 bpm 72 bpm 72 bpm   Heart Rate (Exercise) 84 bpm 98 bpm 102 bpm 79 bpm 99 bpm   Heart Rate (Exit) 78 bpm 88 bpm 75 bpm 70 bpm 68 bpm   Oxygen Saturation (Admit) 100 % 96 % 95 % 96 % 97 %   Oxygen Saturation (Exercise) 97 % 94 % 93 % 96 % 96 %   Oxygen Saturation (Exit) 98 % 97 % 96 % 98 % 96 %   Rating of Perceived Exertion (Exercise) _0 Perceived Dyspnea (Exercise) _1 Duration Progress to 45 minutes of aerobic exercise without signs/symptoms of physical distress Progress to 45 minutes of aerobic exercise without signs/symptoms of physical distress Progress to 45 minutes of aerobic exercise without signs/symptoms of physical distress Progress to 45 minutes of aerobic exercise without signs/symptoms of physical distress Progress to 45 minutes of aerobic exercise without signs/symptoms of physical distress   Intensity _2      Progression   Progression Continue to progress workloads to maintain intensity without signs/symptoms of physical distress. Continue to progress workloads to maintain intensity without signs/symptoms of physical  distress. Continue to progress workloads to maintain intensity without signs/symptoms of physical distress. Continue to progress workloads to maintain intensity without signs/symptoms of physical distress. Continue to progress workloads to maintain intensity without signs/symptoms of physical distress.     Resistance Training   Training Prescription _3    Weight _4    Reps 10-12  10 minutes of strength training 10-12  10 minutes of strength training 10-12  10 minutes of strength training 10-12  10 minutes of strength training 10-12  10 minutes of strength training     Interval Training   Interval Training No No No No  -     NuStep   Level  - 4 4  - 5   Minutes  - 17 17  - 17   METs  - 1.7  -  -  -     Rower   Level _5 Minutes _6 Track   Laps _7 Minutes _8 Exercise Review   Progression  -  -  -  - Yes   Row Name 04/13/16 1500 07/04/16 1500 07/06/16 1600 07/11/16 1600 07/13/16 1600     Response to Exercise   Blood Pressure (Admit) 92/50 100/60 100/64 118/66 120/64   Blood Pressure (Exercise) 122/70 120/70 116/60 102/68 124/66   Blood Pressure (Exit) 100/60 104/60 100/60 102/62 118/70   Heart Rate (Admit) 69 bpm 62 bpm 74 bpm 90 bpm 77 bpm   Heart Rate (Exercise) 76 bpm 102 bpm 107 bpm 118 bpm 75 bpm   Heart Rate (Exit) 70 bpm 71 bpm 83 bpm 89 bpm 69 bpm   Oxygen Saturation (Admit) 99 % 98 % 96 % 95 % 98 %   Oxygen Saturation (Exercise) 98 % 96 % 95 % 94 % 97 %   Oxygen Saturation (Exit) 70 % 95 % 97 % 95 % 98 %  Rating of Perceived Exertion (Exercise) _0 Perceived Dyspnea (Exercise) _1 Duration Progress to 45 minutes of aerobic exercise without signs/symptoms of physical distress Progress to 45 minutes of aerobic exercise without signs/symptoms of physical distress Progress to 45 minutes of aerobic exercise without  signs/symptoms of physical distress Progress to 45 minutes of aerobic exercise without signs/symptoms of physical distress Progress to 45 minutes of aerobic exercise without signs/symptoms of physical distress   Intensity _2      Progression   Progression Continue to progress workloads to maintain intensity without signs/symptoms of physical distress. Continue to progress workloads to maintain intensity without signs/symptoms of physical distress. Continue to progress workloads to maintain intensity without signs/symptoms of physical distress. Continue to progress workloads to maintain intensity without signs/symptoms of physical distress. Continue to progress workloads to maintain intensity without signs/symptoms of physical distress.     Resistance Training   Training Prescription _3    Weight _4    Reps 10-12  10 minutes of strength training 10-15 10-15 10-15 10-15   Time  - -  10 minutes -  10 minutes 10 Minutes 10 Minutes     Interval Training   Interval Training _5      Bike   Level  - 0.5 0.5 0.5 0.5   Minutes  - _6 NuStep   Level 5 3  - 3 3   Minutes 17 17  - 17 17   METs 1.7  -  - 1.4 1.9     Rower   Level 2  -  -  -  -   Minutes 17  -  -  -  -     Track   Laps  - _7 -   Minutes  - _8 -     Home Exercise Plan   Plans to continue exercise at  -  -  - Home (comment)  -   Frequency  -  -  - Add 3 additional days to program exercise sessions.  -   Row Name 07/18/16 1528 07/27/16 1500 08/01/16 1500 08/03/16 1500 08/08/16 1500     Response to Exercise   Blood Pressure (Admit) 130/80 100/70 110/72 110/60 124/74   Blood Pressure (Exercise) 116/60 130/80 110/60 118/62 112/66   Blood Pressure (Exit) 98/60 100/60 98/70 110/80 107/75   Heart Rate (Admit) 74 bpm 86 bpm 69 bpm 71 bpm 75 bpm    Heart Rate (Exercise) 97 bpm 101 bpm 117 bpm 103 bpm 122 bpm   Heart Rate (Exit) 77 bpm 81 bpm 83 bpm 85 bpm 76 bpm   Oxygen Saturation (Admit) 98 % 96 % 95 % 95 % 98 %   Oxygen Saturation (Exercise) 94 % 92 % 94 % 96 % 95 %   Oxygen Saturation (Exit) 94 % 98 % 95 % 94 % 95 %   Rating of Perceived Exertion (Exercise) _9 Perceived Dyspnea (Exercise) _10 Duration Progress to 45 minutes of aerobic exercise without signs/symptoms of physical distress Progress to 45 minutes of aerobic exercise without signs/symptoms of physical distress Progress to 45 minutes of aerobic exercise without signs/symptoms of physical distress Progress to 45 minutes of  aerobic exercise without signs/symptoms of physical distress Progress to 45 minutes of aerobic exercise without signs/symptoms of physical distress   Intensity _0      Progression   Progression Continue to progress workloads to maintain intensity without signs/symptoms of physical distress. Continue to progress workloads to maintain intensity without signs/symptoms of physical distress. Continue to progress workloads to maintain intensity without signs/symptoms of physical distress. Continue to progress workloads to maintain intensity without signs/symptoms of physical distress. Continue to progress workloads to maintain intensity without signs/symptoms of physical distress.     Resistance Training   Training Prescription _1    Weight _2    Reps 10-15 10-15 10-15 10-15 10-15   Time 10 Minutes 10 Minutes 10 Minutes 10 Minutes 10 Minutes     Interval Training   Interval Training _3      Bike   Level 0.5  - 1 1.4 1.4   Minutes 17  - _4 NuStep   Level _5 Minutes _6 METs 2.4 2.6  - 2.1 2.2     Track   Laps _7 - 15   Minutes _8 - 17     Exercise Review   Progression  - Yes  -  -  -   Row Name 08/10/16 1600 08/15/16 1540 08/17/16 1523 08/22/16 1638 08/24/16 1557     Response to Exercise   Blood Pressure (Admit) 100/72 112/74 104/60 114/64 114/66   Blood Pressure (Exercise) 128/80 100/70 102/60 130/70 100/64   Blood Pressure (Exit) 110/80 110/73 90/50 100/66 100/70   Heart Rate (Admit) 76 bpm 69 bpm 74 bpm 76 bpm 80 bpm   Heart Rate (Exercise) 111 bpm 97 bpm 124 bpm 131 bpm 140 bpm   Heart Rate (Exit) 100 bpm 81 bpm 96 bpm 88 bpm 93 bpm   Oxygen Saturation (Admit) 96 % 97 % 96 % 96 % 98 %   Oxygen Saturation (Exercise) 95 % 94 % 94 % 94 % 94 %   Oxygen Saturation (Exit) 94 % 95 % 91 % 92 % 96 %   Rating of Perceived Exertion (Exercise) _9 Perceived Dyspnea (Exercise) _10 Duration Progress to 45 minutes of aerobic exercise without signs/symptoms of physical distress Progress to 45 minutes of aerobic exercise without signs/symptoms of physical distress Progress to 45 minutes of aerobic exercise without signs/symptoms of physical distress Progress to 45 minutes of aerobic exercise without signs/symptoms of physical distress Progress to 45 minutes of aerobic exercise without signs/symptoms of physical distress   Intensity _11      Progression   Progression Continue to progress workloads to maintain intensity without signs/symptoms of physical distress. Continue to progress workloads to maintain intensity without signs/symptoms of physical distress. Continue to progress workloads to maintain intensity without signs/symptoms of physical distress. Continue to progress workloads to maintain intensity without signs/symptoms of physical distress. Continue to progress workloads to maintain intensity without signs/symptoms of physical distress.     Resistance Training   Training Prescription _12    Weight orange bands  orange bands orange bands orange bands orange bands   Reps 10-15 10-15  10-15 10-15 10-15   Time 10 Minutes 10 Minutes 10 Minutes 10 Minutes 10 Minutes     Interval Training   Interval Training _0      Treadmill   MPH 1._1 2.3   Grade 0 0 0 0 3   Minutes _2 Bike   Level  - 1 1 1.5 1   Minutes  - _3 NuStep   Level  - -  -  -  -   Minutes  - -  -  -  -     Track   Laps  - -  -  -  -   Minutes  - -  -  -  -   Row Name 08/29/16 1531 08/31/16 1557 09/05/16 1500 09/07/16 1500       Response to Exercise   Blood Pressure (Admit) 108/66 98/50 110/78 104/66    Blood Pressure (Exercise)  - 118/64 106/64 128/64    Blood Pressure (Exit) 90/52  86/40, H2O given, see recheck 96/54  86/40, H2O given, see recheck 124/79 94/70    Heart Rate (Admit) 83 bpm 67 bpm 75 bpm 65 bpm    Heart Rate (Exercise) 133 bpm 87 bpm 137 bpm 100 bpm    Heart Rate (Exit) 95 bpm 76 bpm 85 bpm 77 bpm    Oxygen Saturation (Admit) 97 % 99 % 96 % 98 %    Oxygen Saturation (Exercise) 95 % 96 % 93 % 96 %    Oxygen Saturation (Exit) 95 % 97 % 94 % 94 %    Rating of Perceived Exertion (Exercise) _4 Perceived Dyspnea (Exercise) _5 Duration Progress to 45 minutes of aerobic exercise without signs/symptoms of physical distress Progress to 45 minutes of aerobic exercise without signs/symptoms of physical distress Continue with 45 min of aerobic exercise without signs/symptoms of physical distress. Progress to 45 minutes of aerobic exercise without signs/symptoms of physical distress    Intensity THRR unchanged THRR unchanged THRR unchanged THRR unchanged      Progression   Progression Continue to progress workloads to maintain intensity without signs/symptoms of physical distress. Continue to progress workloads to maintain intensity without signs/symptoms of physical distress. Continue to progress workloads to maintain intensity without signs/symptoms of  physical distress. Continue to progress workloads to maintain intensity without signs/symptoms of physical distress.      Resistance Training   Training Prescription Yes Yes Yes Yes    Weight orange bands orange bands orange bands orange bands    Reps 10-15 10-15 10-15 10-15    Time 10 Minutes 10 Minutes 10 Minutes 10 Minutes      Interval Training   Interval Training No No No No      Treadmill   MPH 2 2.3 2  -    Grade 0 3 3  -    Minutes 17 34 34  -      Bike   Level 1.5  - 1.7 1.5    Minutes 17  - 17 30       Exercise Comments:     Exercise Comments    Row Name 04/04/16 0826 06/26/16 0911 07/11/16 1642       Exercise Comments Patient walkes up to 16 laps in 15 minutes. MET level places her at a low level. Rates  her level of exertion at a low level. Will cont. to monitor and progress.  patient hopes to be able to meet [facility] exercise requirements needed for transplant Home exercise completed        Exercise Goals and Review:     Exercise Goals    Row Name 06/26/16 0911             Exercise Goals   Increase Physical Activity Yes       Intervention Provide advice, education, support and counseling about physical activity/exercise needs.;Develop an individualized exercise prescription for aerobic and resistive training based on initial evaluation findings, risk stratification, comorbidities and participant's personal goals.       Expected Outcomes Achievement of increased cardiorespiratory fitness and enhanced flexibility, muscular endurance and strength shown through measurements of functional capacity and personal statement of participant.       Increase Strength and Stamina Yes       Intervention Provide advice, education, support and counseling about physical activity/exercise needs.;Develop an individualized exercise prescription for aerobic and resistive training based on initial evaluation findings, risk stratification, comorbidities and participant's personal  goals.       Expected Outcomes Achievement of increased cardiorespiratory fitness and enhanced flexibility, muscular endurance and strength shown through measurements of functional capacity and personal statement of participant.          Exercise Goals Re-Evaluation :     Exercise Goals Re-Evaluation    Row Name 07/17/16 1252 08/14/16 1142 09/11/16 1123         Exercise Goal Re-Evaluation   Exercise Goals Review Increase Physical Activity;Increase Strenth and Stamina Increase Strenth and Stamina;Increase Physical Activity Increase Physical Activity;Increase Strenth and Stamina     Comments Patient has only attended four exercise sessions. Will cont. to monitor and progress as appropriate.  Patient is progressing well in program. She has graduated from the walking track to the treadmill. We are now focusing more on the [facility] protocol for her goals. Her rate of perceived exertion is always "somewhat hard" to "hard". Will cont to monitor and progress. Patient is progressing well. She is currently doing her "warm up" independently. She is doing 20 minutes on the [place] and 30 minutes on the treadmill with an extended "cool down". She perceives the intensities as "hard". The patient will be graduating in a few weeks. She is already in the process of getting established at [facility] in [facility] for her exercise post graduation.     Expected Outcomes Through exercising at rehab and at home, the patient will be able to increase strength and stamina which will make ADL's easier to preform. Through exercising at rehab and at home, patient will increase physical strength and stamina and find ADL's easier to perform.  Through exercising at rehab and at home, patient will increase physical strength and stamina and find ADL's easier to perform.         Discharge Exercise Prescription (Final Exercise Prescription Changes):     Exercise Prescription Changes - 09/07/16 1500      Response to Exercise    Blood Pressure (Admit) 104/66   Blood Pressure (Exercise) 128/64   Blood Pressure (Exit) 94/70   Heart Rate (Admit) 65 bpm   Heart Rate (Exercise) 100 bpm   Heart Rate (Exit) 77 bpm   Oxygen Saturation (Admit) 98 %   Oxygen Saturation (Exercise) 96 %   Oxygen Saturation (Exit) 94 %   Rating of Perceived Exertion (Exercise) 13   Perceived Dyspnea (Exercise)  3   Duration Progress to 45 minutes of aerobic exercise without signs/symptoms of physical distress   Intensity THRR unchanged     Progression   Progression Continue to progress workloads to maintain intensity without signs/symptoms of physical distress.     Resistance Training   Training Prescription Yes   Weight orange bands   Reps 10-15   Time 10 Minutes     Interval Training   Interval Training No     Bike   Level 1.5   Minutes 30      Nutrition:  Target Goals: Understanding of nutrition guidelines, daily intake of sodium <159m, cholesterol <208m calories 30% from fat and 7% or less from saturated fats, daily to have 5 or more servings of fruits and vegetables.  Biometrics:     Pre Biometrics - 06/26/16 0912      Pre Biometrics   Grip Strength 20 kg       Nutrition Therapy Plan and Nutrition Goals:     Nutrition Therapy & Goals - 04/13/16 1512      Nutrition Therapy   Diet Therapeutic Lifestyle Changes     Personal Nutrition Goals   Nutrition Goal Pt to maintain her UBW around 128-135 lb   Personal Goal #2 Decrease consumption of simple carbs (e.g. Coke, sweet tea, desserts)     Intervention Plan   Intervention Prescribe, educate and counsel regarding individualized specific dietary modifications aiming towards targeted core components such as weight, hypertension, lipid management, diabetes, heart failure and other comorbidities.   Expected Outcomes Short Term Goal: Understand basic principles of dietary content, such as calories, fat, sodium, cholesterol and nutrients.;Long Term Goal: Adherence  to prescribed nutrition plan.      Nutrition Discharge: Rate Your Plate Scores:     Nutrition Assessments - 04/13/16 1510      Rate Your Plate Scores   Pre Score 38      Nutrition Goals Re-Evaluation:     Nutrition Goals Re-Evaluation    Row Name 05/10/16 1331             Goals   Current Weight 130 lb 15.3 oz (59.4 kg)       Nutrition Goal Pt to maintain her UBW around 128-135 lb       Comment Wt goal met.  No new information available to assess simple carb intake         Personal Goal #2 Re-Evaluation   Personal Goal #2 Decrease consumption of simple carbs (e.g. Coke, sweet tea, desserts)          Nutrition Goals Discharge (Final Nutrition Goals Re-Evaluation):     Nutrition Goals Re-Evaluation - 05/10/16 1331      Goals   Current Weight 130 lb 15.3 oz (59.4 kg)   Nutrition Goal Pt to maintain her UBW around 128-135 lb   Comment Wt goal met.  No new information available to assess simple carb intake     Personal Goal #2 Re-Evaluation   Personal Goal #2 Decrease consumption of simple carbs (e.g. Coke, sweet tea, desserts)      Psychosocial: Target Goals: Acknowledge presence or absence of significant depression and/or stress, maximize coping skills, provide positive support system. Participant is able to verbalize types and ability to use techniques and skills needed for reducing stress and depression.  Initial Review & Psychosocial Screening:     Initial Psych Review & Screening - 06/26/16 0929      Initial Review   Current issues with None Identified  Family Dynamics   Good Support System? Yes     Barriers   Psychosocial barriers to participate in program There are no identifiable barriers or psychosocial needs.;The patient should benefit from training in stress management and relaxation.     Screening Interventions   Interventions Encouraged to exercise      Quality of Life Scores:   PHQ-9: Recent Review Flowsheet Data    Depression  screen [facility] 2/9 06/26/2016 02/07/2016   Decreased Interest 0 1   Down, Depressed, Hopeless 0 0   PHQ - 2 Score 0 1     Interpretation of Total Score  Total Score Depression Severity:  1-4 = Minimal depression, 5-9 = Mild depression, 10-14 = Moderate depression, 15-19 = Moderately severe depression, 20-27 = Severe depression   Psychosocial Evaluation and Intervention:     Psychosocial Evaluation - 06/26/16 0930      Psychosocial Evaluation & Interventions   Interventions Encouraged to exercise with the program and follow exercise prescription   Continue Psychosocial Services  No Follow up required      Psychosocial Re-Evaluation:     Psychosocial Re-Evaluation    [place] Name 04/03/16 1441 07/18/16 0842 08/15/16 0955 09/12/16 0954       Psychosocial Re-Evaluation   Current issues with  - None Identified None Identified None Identified    Comments No psychosocial issues identified  -  - great mental outlook re: transplant in the future    Interventions Encouraged to attend Pulmonary Rehabilitation for the exercise Encouraged to attend Cardiac Rehabilitation for the exercise Encouraged to attend Pulmonary Rehabilitation for the exercise Encouraged to attend Pulmonary Rehabilitation for the exercise    Continue Psychosocial Services  No No Follow up required No Follow up required No Follow up required       Psychosocial Discharge (Final Psychosocial Re-Evaluation):     Psychosocial Re-Evaluation - 09/12/16 0954      Psychosocial Re-Evaluation   Current issues with None Identified   Comments great mental outlook re: transplant in the future   Interventions Encouraged to attend Pulmonary Rehabilitation for the exercise   Continue Psychosocial Services  No Follow up required      Education: Education Goals: Education classes will be provided on a weekly basis, covering required topics. Participant will state understanding/return demonstration of topics presented.  Learning  Barriers/Preferences:     Learning Barriers/Preferences - 06/26/16 0924      Learning Barriers/Preferences   Learning Barriers None   Learning Preferences Group Instruction;Written Material;Individual Instruction      Education Topics: Risk Factor Reduction:  -Group instruction that is supported by a PowerPoint presentation. Instructor discusses the definition of a risk factor, different risk factors for pulmonary disease, and how the heart and lungs work together.     Nutrition for Pulmonary Patient:  -Group instruction provided by PowerPoint slides, verbal discussion, and written materials to support subject matter. The instructor gives an explanation and review of healthy diet recommendations, which includes a discussion on weight management, recommendations for fruit and vegetable consumption, as well as protein, fluid, caffeine, fiber, sodium, sugar, and alcohol. Tips for eating when patients are short of breath are discussed.   PULMONARY REHAB OTHER RESPIRATORY from 09/07/2016 in [place]  Date  08/31/16  Educator  [place]  Instruction Review Code  2- meets goals/outcomes      Pursed Lip Breathing:  -Group instruction that is supported by demonstration and informational handouts. Instructor discusses the benefits of pursed lip and  diaphragmatic breathing and detailed demonstration on how to preform both.     Oxygen Safety:  -Group instruction provided by PowerPoint, verbal discussion, and written material to support subject matter. There is an overview of "What is Oxygen" and "Why do we need it".  Instructor also reviews how to create a safe environment for oxygen use, the importance of using oxygen as prescribed, and the risks of noncompliance. There is a brief discussion on traveling with oxygen and resources the patient may utilize.   PULMONARY REHAB OTHER RESPIRATORY from 09/07/2016 in [place]  Date  08/24/16   Educator  Truddie Crumble  Instruction Review Code  2- meets goals/outcomes      Oxygen Equipment:  -Group instruction provided by [employer] Staff utilizing handouts, written materials, and equipment demonstrations.   PULMONARY REHAB OTHER RESPIRATORY from 09/07/2016 in [place]  Date  03/16/16  Educator  [employer]  Instruction Review Code  2- meets goals/outcomes      Signs and Symptoms:  -Group instruction provided by written material and verbal discussion to support subject matter. Warning signs and symptoms of infection, stroke, and heart attack are reviewed and when to call the physician/911 reinforced. Tips for preventing the spread of infection discussed.   PULMONARY REHAB OTHER RESPIRATORY from 09/07/2016 in [place]  Date  07/13/16  Educator  rn  Instruction Review Code  2- meets goals/outcomes      Advanced Directives:  -Group instruction provided by verbal instruction and written material to support subject matter. Instructor reviews Advanced Directive laws and proper instruction for filling out document.   Pulmonary Video:  -Group video education that reviews the importance of medication and oxygen compliance, exercise, good nutrition, pulmonary hygiene, and pursed lip and diaphragmatic breathing for the pulmonary patient.   Exercise for the Pulmonary Patient:  -Group instruction that is supported by a PowerPoint presentation. Instructor discusses benefits of exercise, core components of exercise, frequency, duration, and intensity of an exercise routine, importance of utilizing pulse oximetry during exercise, safety while exercising, and options of places to exercise outside of rehab.     PULMONARY REHAB OTHER RESPIRATORY from 09/07/2016 in [place]  Date  09/07/16  Educator  EP  Instruction Review Code  2- meets goals/outcomes      Pulmonary Medications:  -Verbally  interactive group education provided by instructor with focus on inhaled medications and proper administration.   PULMONARY REHAB OTHER RESPIRATORY from 09/07/2016 in [place]  Date  07/27/16  Educator  [facility]  Instruction Review Code  2- meets goals/outcomes      Anatomy and Physiology of the Respiratory System and Intimacy:  -Group instruction provided by PowerPoint, verbal discussion, and written material to support subject matter. Instructor reviews respiratory cycle and anatomical components of the respiratory system and their functions. Instructor also reviews differences in obstructive and restrictive respiratory diseases with examples of each. Intimacy, Sex, and Sexuality differences are reviewed with a discussion on how relationships can change when diagnosed with pulmonary disease. Common sexual concerns are reviewed.   PULMONARY REHAB OTHER RESPIRATORY from 09/07/2016 in [place]  Date  02/24/16  Educator  rn  Instruction Review Code  2- meets goals/outcomes      MD DAY -A group question and answer session with a medical doctor that allows participants to ask questions that relate to their pulmonary disease state.  OTHER EDUCATION -Group or individual verbal, written, or video instructions that support the educational goals of the pulmonary rehab program.   Knowledge Questionnaire Score:     Knowledge Questionnaire Score - 07/19/16 1203      Knowledge Questionnaire Score   Pre Score 11/13      Core Components/Risk Factors/Patient Goals at Admission:     Personal Goals and Risk Factors at Admission - 06/26/16 0927      Core Components/Risk Factors/Patient Goals on Admission    Weight Management Yes;Weight Loss   Intervention Weight Management: Develop a combined nutrition and exercise program designed to reach desired caloric intake, while maintaining appropriate intake of nutrient and fiber, sodium  and fats, and appropriate energy expenditure required for the weight goal.;Weight Management: Provide education and appropriate resources to help participant work on and attain dietary goals.   Expected Outcomes Short Term: Continue to assess and modify interventions until short term weight is achieved   Improve shortness of breath with ADL's Yes   Intervention Provide education, individualized exercise plan and daily activity instruction to help decrease symptoms of SOB with activities of daily living.   Expected Outcomes Short Term: Achieves a reduction of symptoms when performing activities of daily living.   Heart Failure Yes   Intervention Provide a combined exercise and nutrition program that is supplemented with education, support and counseling about heart failure. Directed toward relieving symptoms such as shortness of breath, decreased exercise tolerance, and extremity edema.   Expected Outcomes Improve functional capacity of life;Short term: Attendance in program 2-3 days a week with increased exercise capacity. Reported lower sodium intake. Reported increased fruit and vegetable intake. Reports medication compliance.;Short term: Daily weights obtained and reported for increase. Utilizing diuretic protocols set by physician.;Long term: Adoption of self-care skills and reduction of barriers for early signs and symptoms recognition and intervention leading to self-care maintenance.      Core Components/Risk Factors/Patient Goals Review:      Goals and Risk Factor Review    Row Name 04/03/16 1439 07/18/16 0839 08/15/16 0952 09/12/16 0953       Core Components/Risk Factors/Patient Goals Review   Personal Goals Review Develop more efficient breathing techniques such as purse lipped breathing and diaphragmatic breathing and practicing self-pacing with activity.;Improve shortness of breath with ADL's Heart Failure;Improve shortness of breath with ADL's;Weight Management/Obesity Improve  shortness of breath with ADL's;Heart Failure;Weight Management/Obesity Improve shortness of breath with ADL's;Heart Failure;Weight Management/Obesity    Review absent for 1 week d/t cardiac catheterization with minimal CAD results, slowly progressing Just started back in program, has attended 4 exercise sessions, maintaining heart failure well, weight stable, seems to have more energy than last enrollment in program  heart failure controlled, weight stable, progressing well, set up at [facility] for lung transplant when she requires oxygen, presently not on oxygen heart failure controlled, weight stable, progressing well, set up at [facility] for lung transplant when she requires oxygen, presently not on oxygen    Expected Outcomes will continue to gain strength and stamina as workloads increase Progress workloads as tolerated, monitor for signs of heart failure, encourage PLB and home exercise continue with previous plan continue with previous plan       Core Components/Risk Factors/Patient Goals at Discharge (Final Review):      Goals and Risk Factor Review - 09/12/16 0953      Core Components/Risk Factors/Patient Goals Review   Personal Goals Review Improve shortness of breath with ADL's;Heart Failure;Weight Management/Obesity   Review heart failure  controlled, weight stable, progressing well, set up at [facility] for lung transplant when she requires oxygen, presently not on oxygen   Expected Outcomes continue with previous plan      ITP Comments:   Comments: ITP REVIEW Pt is making expected progress toward pulmonary rehab goals after completing 18 sessions. Recommend continued exercise, life style modification, education, and utilization of breathing techniques to increase stamina and strength and decrease shortness of breath with exertion.

## 2016-09-14 ENCOUNTER — Encounter (HOSPITAL_COMMUNITY)
Admission: RE | Admit: 2016-09-14 | Discharge: 2016-09-14 | Disposition: A | Discharge status: Home / Self Care | Payer: Medicare Other | Source: Ambulatory Visit | Attending: Pulmonary Disease | Admitting: Pulmonary Disease

## 2016-09-14 VITALS — Wt 140.4 lb

## 2016-09-14 DIAGNOSIS — J8410 Pulmonary fibrosis, unspecified: Secondary | ICD-10-CM | POA: Diagnosis not present

## 2016-09-14 NOTE — Progress Notes (Signed)
Daily Session Note  Patient Details  Name: [name] [name] MRN: [id number] Date of Birth: 09/07/1950 Referring Provider:     Pulmonary Rehab Walk Test from 06/27/2016 in [facility]  Referring Provider  Dr. Yacoub      Encounter Date: 09/14/2016  Check In:     Session Check In - 09/14/16 1329      Check-In   Location [facility]   Staff Present Portia Payne, RN, BSN; , RN, BSN;Molly diVincenzo, MS, ACSM RCEP, Exercise Physiologist;Lisa Hughes, RN   Supervising physician immediately available to respond to emergencies [facility] immediately available   Physician(s) Dr. Vega   Medication changes reported     No   Fall or balance concerns reported    No   Tobacco Cessation No Change   Warm-up and Cool-down Performed as group-led instruction   Resistance Training Performed Yes   VAD Patient? No     Pain Assessment   Currently in Pain? No/denies   Multiple Pain Sites No      Capillary Blood Glucose: No results found for this or any previous visit (from the past 24 hour(s)).      Exercise Prescription Changes - 09/14/16 1500      Response to Exercise   Blood Pressure (Admit) 100/60   Blood Pressure (Exercise) 132/70   Blood Pressure (Exit) 96/60   Heart Rate (Admit) 61 bpm   Heart Rate (Exercise) 99 bpm   Heart Rate (Exit) 84 bpm   Oxygen Saturation (Admit) 98 %   Oxygen Saturation (Exercise) 95 %   Oxygen Saturation (Exit) 92 %   Rating of Perceived Exertion (Exercise) 13   Perceived Dyspnea (Exercise) 3   Duration Progress to 45 minutes of aerobic exercise without signs/symptoms of physical distress   Intensity THRR unchanged     Progression   Progression Continue to progress workloads to maintain intensity without signs/symptoms of physical distress.     Resistance Training   Training Prescription Yes   Weight orange bands   Reps 10-15   Time 10 Minutes     Interval Training   Interval Training  No     Treadmill   MPH 2.2   Grade 2   Minutes 30     Bike   Level 1.7   Minutes 17      History  Smoking Status  . Former Smoker  . Quit date: 03/13/1996  Smokeless Tobacco  . Never Used    Goals Met:  Exercise tolerated well Strength training completed today  Goals Unmet:  Not Applicable  Comments: Service time is from 1330 to 1500    Dr. Wesam G. Yacoub is Medical Director for Pulmonary Rehab at [facility]. 

## 2016-09-19 ENCOUNTER — Encounter (HOSPITAL_COMMUNITY)
Admission: RE | Admit: 2016-09-19 | Discharge: 2016-09-19 | Disposition: A | Discharge status: Home / Self Care | Payer: Medicare Other | Source: Ambulatory Visit | Attending: Pulmonary Disease | Admitting: Pulmonary Disease

## 2016-09-19 VITALS — Wt 141.8 lb

## 2016-09-19 DIAGNOSIS — J8410 Pulmonary fibrosis, unspecified: Secondary | ICD-10-CM

## 2016-09-19 NOTE — Progress Notes (Signed)
Daily Session Note  Patient Details  Name: [name] [name] MRN: [id number] Date of Birth: [birth year] Referring Provider:     Pulmonary Rehab Walk Test from 06/27/2016 in [place]  Referring Provider  Dr. Nelda Marseille      Encounter Date: 09/19/2016  Check In:     Session Check In - 09/19/16 1330      Check-In   Location [facility]   Staff Present Rosebud Poles, RN, BSN;Lisa Ysidro Evert, RN;Portia Rollene Rotunda, RN, BSN   Supervising physician immediately available to respond to emergencies [facility] immediately available   Physician(s) Dr. Doyle Askew   Medication changes reported     No   Tobacco Cessation No Change   Warm-up and Cool-down Performed as group-led instruction   Resistance Training Performed Yes   VAD Patient? No     Pain Assessment   Currently in Pain? No/denies      Capillary Blood Glucose: No results found for this or any previous visit (from the past 24 hour(s)).      Exercise Prescription Changes - 09/19/16 1500      Response to Exercise   Blood Pressure (Admit) 100/60   Blood Pressure (Exercise) 122/70   Blood Pressure (Exit) 100/62   Heart Rate (Admit) 70 bpm   Heart Rate (Exercise) 103 bpm   Heart Rate (Exit) 83 bpm   Oxygen Saturation (Admit) 97 %   Oxygen Saturation (Exercise) 94 %   Oxygen Saturation (Exit) 94 %   Rating of Perceived Exertion (Exercise) 13   Perceived Dyspnea (Exercise) 3   Duration Progress to 45 minutes of aerobic exercise without signs/symptoms of physical distress   Intensity THRR unchanged     Progression   Progression Continue to progress workloads to maintain intensity without signs/symptoms of physical distress.     Resistance Training   Training Prescription Yes   Weight orange bands   Reps 10-15   Time 10 Minutes     Interval Training   Interval Training No     Treadmill   MPH 2.2   Grade 2   Minutes 30     Bike   Level 1.5   Minutes 17      History  Smoking  Status  . Former Smoker  . Quit date: 03/13/1996  Smokeless Tobacco  . Never Used    Goals Met:  Exercise tolerated well Strength training completed today  Goals Unmet:  Not Applicable  Comments: Service time is from 1330 to [place]    Dr. Rush Farmer is Medical Director for Pulmonary Rehab at [facility].

## 2016-09-21 ENCOUNTER — Encounter (HOSPITAL_COMMUNITY)
Admission: RE | Admit: 2016-09-21 | Discharge: 2016-09-21 | Disposition: A | Discharge status: Home / Self Care | Payer: Medicare Other | Source: Ambulatory Visit | Attending: Pulmonary Disease | Admitting: Pulmonary Disease

## 2016-09-21 VITALS — Wt 141.5 lb

## 2016-09-21 DIAGNOSIS — J8410 Pulmonary fibrosis, unspecified: Secondary | ICD-10-CM | POA: Diagnosis not present

## 2016-09-21 NOTE — Progress Notes (Signed)
Daily Session Note  Patient Details  Name: [name] [name] MRN: [id number] Date of Birth: 06/22/1950 Referring Provider:     Pulmonary Rehab Walk Test from 06/27/2016 in [place]  Referring Provider  Dr. Nelda Marseille      Encounter Date: 09/21/2016  Check In:     Session Check In - 09/21/16 1350      Check-In   Location [facility]   Staff Present Trish Fountain, RN, Maxcine Ham, RN, Roque Cash, RN   Supervising physician immediately available to respond to emergencies [facility] immediately available   Physician(s) Dr. Maryland Pink   Medication changes reported     No   Fall or balance concerns reported    No   Tobacco Cessation No Change   Warm-up and Cool-down Performed as group-led instruction   Resistance Training Performed Yes   VAD Patient? No     Pain Assessment   Currently in Pain? No/denies   Multiple Pain Sites No      Capillary Blood Glucose: No results found for this or any previous visit (from the past 24 hour(s)).      Exercise Prescription Changes - 09/21/16 1557      Response to Exercise   Blood Pressure (Admit) 100/60   Blood Pressure (Exercise) 126/66   Blood Pressure (Exit) 104/64   Heart Rate (Admit) 79 bpm   Heart Rate (Exercise) 115 bpm   Heart Rate (Exit) 75 bpm   Oxygen Saturation (Admit) 98 %   Oxygen Saturation (Exercise) 95 %   Oxygen Saturation (Exit) 96 %   Rating of Perceived Exertion (Exercise) 13   Perceived Dyspnea (Exercise) 3   Duration Progress to 45 minutes of aerobic exercise without signs/symptoms of physical distress   Intensity THRR unchanged     Progression   Progression Continue to progress workloads to maintain intensity without signs/symptoms of physical distress.     Resistance Training   Training Prescription Yes   Weight orange bands   Reps 10-15   Time 10 Minutes     Interval Training   Interval Training No     Treadmill   MPH 2.2   Grade 2   Minutes 17     Bike   Level 1.5   Minutes 17      History  Smoking Status  . Former Smoker  . Quit date: 03/13/1996  Smokeless Tobacco  . Never Used    Goals Met:  Independence with exercise equipment Improved SOB with ADL's Using PLB without cueing & demonstrates good technique Exercise tolerated well No report of cardiac concerns or symptoms Strength training completed today  Goals Unmet:  Not Applicable  Comments: Service time is from 1330 to 1510   Dr. Rush Farmer is Medical Director for Pulmonary Rehab at [facility].

## 2016-09-26 ENCOUNTER — Encounter (HOSPITAL_COMMUNITY)
Admission: RE | Admit: 2016-09-26 | Discharge: 2016-09-26 | Disposition: A | Discharge status: Home / Self Care | Payer: Medicare Other | Source: Ambulatory Visit | Attending: Pulmonary Disease | Admitting: Pulmonary Disease

## 2016-09-26 VITALS — Wt 143.7 lb

## 2016-09-26 DIAGNOSIS — J8410 Pulmonary fibrosis, unspecified: Secondary | ICD-10-CM | POA: Diagnosis not present

## 2016-09-26 NOTE — Progress Notes (Signed)
Daily Session Note  Patient Details  Name: [name] [name] MRN: [id number] Date of Birth: 01/29/1951 Referring Provider:     Pulmonary Rehab Walk Test from 06/27/2016 in [place]  Referring Provider  Dr. Nelda Marseille      Encounter Date: 09/26/2016  Check In:     Session Check In - 09/26/16 1330      Check-In   Location [facility]   Staff Present Rosebud Poles, RN, BSN;Molly diVincenzo, MS, ACSM RCEP, Exercise Physiologist;Lisa Ysidro Evert, RN   Supervising physician immediately available to respond to emergencies [facility] immediately available   Physician(s) Dr. Maryland Pink   Medication changes reported     No   Fall or balance concerns reported    No   Tobacco Cessation No Change   Warm-up and Cool-down Performed as group-led instruction   Resistance Training Performed Yes     Pain Assessment   Currently in Pain? No/denies      Capillary Blood Glucose: No results found for this or any previous visit (from the past 24 hour(s)).      Exercise Prescription Changes - 09/26/16 1533      Response to Exercise   Blood Pressure (Admit) 100/64   Blood Pressure (Exercise) 140/62   Blood Pressure (Exit) 94/60   Heart Rate (Admit) 81 bpm   Heart Rate (Exercise) 109 bpm   Heart Rate (Exit) 80 bpm   Oxygen Saturation (Admit) 97 %   Oxygen Saturation (Exercise) 91 %   Oxygen Saturation (Exit) 93 %   Rating of Perceived Exertion (Exercise) 13   Perceived Dyspnea (Exercise) 3   Duration Progress to 45 minutes of aerobic exercise without signs/symptoms of physical distress   Intensity THRR unchanged     Progression   Progression Continue to progress workloads to maintain intensity without signs/symptoms of physical distress.     Resistance Training   Training Prescription Yes   Weight orange bands   Reps 10-15   Time 10 Minutes     Interval Training   Interval Training No     Treadmill   MPH 2.2   Grade 2   Minutes 34     Bike   Level 1.5   Minutes 17      History  Smoking Status  . Former Smoker  . Quit date: 03/13/1996  Smokeless Tobacco  . Never Used    Goals Met:  Independence with exercise equipment Improved SOB with ADL's Using PLB without cueing & demonstrates good technique Exercise tolerated well No report of cardiac concerns or symptoms Strength training completed today  Goals Unmet:  Not Applicable  Comments: Service time is from 1330 to 1455   Dr. Rush Farmer is Medical Director for Pulmonary Rehab at [facility].

## 2016-09-28 ENCOUNTER — Encounter (HOSPITAL_COMMUNITY)
Admission: RE | Admit: 2016-09-28 | Discharge: 2016-09-28 | Disposition: A | Discharge status: Home / Self Care | Payer: Medicare Other | Source: Ambulatory Visit | Attending: Pulmonary Disease | Admitting: Pulmonary Disease

## 2016-09-28 DIAGNOSIS — J8410 Pulmonary fibrosis, unspecified: Secondary | ICD-10-CM

## 2016-10-03 ENCOUNTER — Encounter (HOSPITAL_COMMUNITY): Payer: Medicare Other

## 2016-10-05 ENCOUNTER — Encounter (HOSPITAL_COMMUNITY): Payer: Medicare Other

## 2016-10-10 ENCOUNTER — Encounter (HOSPITAL_COMMUNITY): Payer: Medicare Other

## 2016-10-12 ENCOUNTER — Encounter (HOSPITAL_COMMUNITY): Payer: Medicare Other

## 2016-10-17 ENCOUNTER — Encounter (HOSPITAL_COMMUNITY): Payer: Medicare Other

## 2016-10-19 ENCOUNTER — Encounter (HOSPITAL_COMMUNITY): Payer: Medicare Other

## 2016-11-14 ENCOUNTER — Encounter (HOSPITAL_COMMUNITY): Payer: Self-pay

## 2016-11-14 DIAGNOSIS — J8410 Pulmonary fibrosis, unspecified: Secondary | ICD-10-CM

## 2016-11-14 NOTE — Progress Notes (Signed)
Discharge Progress Report  Patient Details  Name: [name] [name] MRN: [id number] Date of Birth: 1951-02-10 Referring Provider:     Pulmonary Rehab Walk Test from 06/27/2016 in [place]  Referring Provider  Dr. Nelda Marseille       Number of Visits: 24  Reason for Discharge:  Patient reached a stable level of exercise. Patient independent in their exercise. Patient has met program and personal goals.  Smoking History:  History  Smoking Status  . Former Smoker  . Quit date: 03/13/1996  Smokeless Tobacco  . Never Used    Diagnosis:  Pulmonary fibrosis (HCC)  ADL UCSD:     Pulmonary Assessment Scores    Row Name 09/29/16 0710         ADL UCSD   ADL Phase Exit       mMRC Score   mMRC Score 3        Initial Exercise Prescription:   Discharge Exercise Prescription (Final Exercise Prescription Changes):     Exercise Prescription Changes - 09/26/16 1533      Response to Exercise   Blood Pressure (Admit) 100/64   Blood Pressure (Exercise) 140/62   Blood Pressure (Exit) 94/60   Heart Rate (Admit) 81 bpm   Heart Rate (Exercise) 109 bpm   Heart Rate (Exit) 80 bpm   Oxygen Saturation (Admit) 97 %   Oxygen Saturation (Exercise) 91 %   Oxygen Saturation (Exit) 93 %   Rating of Perceived Exertion (Exercise) 13   Perceived Dyspnea (Exercise) 3   Duration Progress to 45 minutes of aerobic exercise without signs/symptoms of physical distress   Intensity THRR unchanged     Progression   Progression Continue to progress workloads to maintain intensity without signs/symptoms of physical distress.     Resistance Training   Training Prescription Yes   Weight orange bands   Reps 10-15   Time 10 Minutes     Interval Training   Interval Training No     Treadmill   MPH 2.2   Grade 2   Minutes 34     Bike   Level 1.5   Minutes 17      Functional Capacity:     6 Minute Walk    Row Name 09/29/16 0708         6 Minute Walk   Phase Discharge     Distance 1453 feet     Walk Time 6 minutes     # of Rest Breaks 0     MPH 2.75     METS 3.14     RPE 13     Perceived Dyspnea  3     Symptoms No     Resting HR 70 bpm     Resting BP 100/68     Max Ex. HR 93 bpm     Max Ex. BP 104/70       Interval HR   Baseline HR (retired) 70     1 Minute HR 88     4 Minute HR 93     5 Minute HR 80     6 Minute HR 89     2 Minute Post HR 87     Interval Heart Rate? Yes       Interval Oxygen   Interval Oxygen? Yes     Baseline Oxygen Saturation % 96 %     Resting Liters of Oxygen 0 L     1 Minute Oxygen Saturation % 94 %  1 Minute Liters of Oxygen 0 L     2 Minute Liters of Oxygen 0 L     3 Minute Liters of Oxygen 0 L     4 Minute Oxygen Saturation % 97 %     4 Minute Liters of Oxygen 0 L     5 Minute Oxygen Saturation % 96 %     5 Minute Liters of Oxygen 0 L     6 Minute Oxygen Saturation % 97 %     6 Minute Liters of Oxygen 0 L     2 Minute Post Oxygen Saturation % 97 %     2 Minute Post Liters of Oxygen 0 L        Psychological, QOL, Others - Outcomes: PHQ 2/9: Depression screen [facility] 2/9 09/28/2016 06/26/2016 02/07/2016  Decreased Interest 0 0 1  Down, Depressed, Hopeless 0 0 0  PHQ - 2 Score 0 0 1    Quality of Life:   Personal Goals: Goals established at orientation with interventions provided to work toward goal.    Personal Goals Discharge:     Goals and Risk Factor Review    Row Name 11/14/16 1307             Core Components/Risk Factors/Patient Goals Review   Review patient met all goals at discharge          Exercise Goals and Review:   Nutrition & Weight - Outcomes:      Post Biometrics - 09/29/16 0710       Post  Biometrics   Grip Strength 22 kg      Nutrition:   Nutrition Discharge:   Education Questionnaire Score:

## 2017-04-28 ENCOUNTER — Other Ambulatory Visit: Payer: Self-pay

## 2017-04-28 ENCOUNTER — Encounter (HOSPITAL_COMMUNITY): Payer: Self-pay | Admitting: *Deleted

## 2017-04-28 ENCOUNTER — Emergency Department (HOSPITAL_COMMUNITY)
Admission: EM | Admit: 2017-04-28 | Discharge: 2017-04-28 | Disposition: A | Discharge status: Home / Self Care | Payer: Medicare Other | Attending: Emergency Medicine | Admitting: Emergency Medicine

## 2017-04-28 ENCOUNTER — Emergency Department (HOSPITAL_COMMUNITY): Payer: Medicare Other

## 2017-04-28 DIAGNOSIS — J45909 Unspecified asthma, uncomplicated: Secondary | ICD-10-CM | POA: Insufficient documentation

## 2017-04-28 DIAGNOSIS — R110 Nausea: Secondary | ICD-10-CM | POA: Diagnosis not present

## 2017-04-28 DIAGNOSIS — Z87891 Personal history of nicotine dependence: Secondary | ICD-10-CM | POA: Diagnosis not present

## 2017-04-28 DIAGNOSIS — I2510 Atherosclerotic heart disease of native coronary artery without angina pectoris: Secondary | ICD-10-CM | POA: Insufficient documentation

## 2017-04-28 DIAGNOSIS — R509 Fever, unspecified: Secondary | ICD-10-CM | POA: Diagnosis present

## 2017-04-28 DIAGNOSIS — Z79899 Other long term (current) drug therapy: Secondary | ICD-10-CM | POA: Diagnosis not present

## 2017-04-28 LAB — URINALYSIS, ROUTINE W REFLEX MICROSCOPIC
BACTERIA UA: NONE SEEN
BILIRUBIN URINE: NEGATIVE
Glucose, UA: NEGATIVE mg/dL
KETONES UR: NEGATIVE mg/dL
Nitrite: POSITIVE — AB
PROTEIN: NEGATIVE mg/dL
RBC / HPF: NONE SEEN RBC/hpf (ref 0–5)
SPECIFIC GRAVITY, URINE: 1.024 (ref 1.005–1.030)
pH: 5 (ref 5.0–8.0)

## 2017-04-28 LAB — INFLUENZA PANEL BY PCR (TYPE A & B)
INFLAPCR: NEGATIVE
INFLBPCR: NEGATIVE

## 2017-04-28 LAB — COMPREHENSIVE METABOLIC PANEL
ALBUMIN: 3.6 g/dL (ref 3.5–5.0)
ALT: 10 U/L — ABNORMAL LOW (ref 14–54)
AST: 23 U/L (ref 15–41)
Alkaline Phosphatase: 99 U/L (ref 38–126)
Anion gap: 13 (ref 5–15)
BUN: 9 mg/dL (ref 6–20)
CHLORIDE: 113 mmol/L — AB (ref 101–111)
CO2: 16 mmol/L — ABNORMAL LOW (ref 22–32)
Calcium: 8.4 mg/dL — ABNORMAL LOW (ref 8.9–10.3)
Creatinine, Ser: 0.82 mg/dL (ref 0.44–1.00)
GFR calc Af Amer: >60 mL/min (ref >60)
Glucose, Bld: 110 mg/dL — ABNORMAL HIGH (ref 65–99)
POTASSIUM: 3.6 mmol/L (ref 3.5–5.1)
Sodium: 142 mmol/L (ref 135–145)
Total Bilirubin: 0.2 mg/dL — ABNORMAL LOW (ref 0.3–1.2)
Total Protein: 6.5 g/dL (ref 6.5–8.1)

## 2017-04-28 LAB — CBC
HEMATOCRIT: 42.9 % (ref 36.0–46.0)
HEMOGLOBIN: 14.3 g/dL (ref 12.0–15.0)
MCH: 30.6 pg (ref 26.0–34.0)
MCHC: 33.3 g/dL (ref 30.0–36.0)
MCV: 91.7 fL (ref 78.0–100.0)
Platelets: 270 10*3/uL (ref 150–400)
RBC: 4.68 MIL/uL (ref 3.87–5.11)
RDW: 13.9 % (ref 11.5–15.5)
WBC: 11.1 10*3/uL — AB (ref 4.0–10.5)

## 2017-04-28 LAB — LIPASE, BLOOD: LIPASE: 32 U/L (ref 11–51)

## 2017-04-28 MED ORDER — CEPHALEXIN 500 MG PO CAPS: 500.0000 mg | ORAL_CAPSULE | Freq: Four times a day (QID) | ORAL | 0 refills | Status: DC

## 2017-04-28 MED ORDER — ONDANSETRON HCL 4 MG PO TABS: 4.0000 mg | ORAL_TABLET | Freq: Three times a day (TID) | ORAL | 0 refills | Status: DC | PRN

## 2017-04-28 MED ORDER — SODIUM CHLORIDE 0.9 % IV BOLUS (SEPSIS)
1000.0000 mL | Freq: Once | INTRAVENOUS | Status: AC
  Administered 2017-04-28: 1000 mL via INTRAVENOUS

## 2017-04-28 MED ORDER — SODIUM CHLORIDE 0.9 % IV SOLN
1.0000 g | Freq: Once | INTRAVENOUS | Status: AC
  Administered 2017-04-28: 1 g via INTRAVENOUS
  Filled 2017-04-28: qty 10

## 2017-04-28 NOTE — ED Provider Notes (Signed)
[facility] EMERGENCY DEPARTMENT Provider Note   CSN: [id number] Arrival date & time: 04/28/17  2979     History   Chief Complaint Chief Complaint  Patient presents with  . Emesis  . Fever    HPI [name] [name] is a 67 y.o. female.  67 year old female with prior history of CAD, pulmonary fibrosis, HIV, and GERD presents with report of weakness and fever.  Patient reports a fever up to 103 started yesterday evening.  Reports associated mild nausea and weakness.  Patient denies significant shortness of breath or chest pain.  Patient did not take anything for her fever this morning.   The history is provided by the patient.  Weakness  Primary symptoms include no focal weakness, no loss of sensation, no loss of balance, no speech change, no auditory change. This is a new problem. The current episode started 12 to 24 hours ago. The problem has not changed since onset.There was no focality noted. The maximum temperature recorded prior to her arrival was 103 to 104 F. Pertinent negatives include no shortness of breath and no chest pain.    Past Medical History:  Diagnosis Date  . Asthma   . Bradycardia    Sinus bradycardia  . CAD (coronary artery disease)    Minimal by history, Question coronary artery spasm  . Colon polyps   . Dyslipidemia   . Ejection fraction   . GERD (gastroesophageal reflux disease)    Esophageal stricture  . H/O: hysterectomy   . HIV positive ([place])    Needle stick 1993  . Hyperlipidemia   . Palpitations   . Peptic ulcer disease   . Shortness of breath    Extensive evaluation Dr. Winona Legato, [facility], May, 8921, etiology uncertain, probable bronchiolitis possibility of lung biopsy at that time  . Weight loss     Patient Active Problem List   Diagnosis Date Noted  . Anxiety state 03/17/2016  . Benign neoplasm of colon 03/17/2016  . Human immunodeficiency virus (HIV) disease ([place]) 03/17/2016  . Nontoxic uninodular goiter 03/17/2016    . Other and unspecified hyperlipidemia 03/17/2016  . Other premature beats 03/17/2016  . Pulmonary interstitial fibrosis ([place]) 03/17/2016  . Rectocele 03/17/2016  . Symptomatic bradycardia 03/17/2016  . Chest pain 03/17/2016  . Lower extremity edema 10/13/2015  . Localized acrodermatitis continua of [place] 06/29/2015  . Cervical pain (neck) 06/14/2015  . Dysphagia 12/14/2014  . IPF (idiopathic pulmonary fibrosis) ([place]) 07/06/2014  . Paronychia 06/16/2014  . Bone pain 08/25/2013  . Lumbar radicular pain 08/25/2013  . Allergic rhinitis 07/14/2013  . Post-operative state 03/25/2013  . Preop cardiovascular exam 12/24/2012  . Ejection fraction   . Cystocele 12/11/2012  . Insomnia 10/23/2012  . Vaginal prolapse 04/02/2012  . Urinary incontinence 02/28/2012  . Other benign neoplasm of connective and other soft tissue of lower limb, including hip 09/08/2011  . Osteoporosis 03/20/2011  . Closed fracture of sacrum ([place]) 12/23/2010  . HIV positive ([place])   . CAD (coronary artery disease)   . Palpitations   . Dyslipidemia   . Persistent asthma   . Peptic ulcer disease   . GERD (gastroesophageal reflux disease)   . Bradycardia   . Shortness of breath   . PULMONARY FIBROSIS 07/06/2008  . CONSTIPATION 07/22/2007  . WEIGHT LOSS-ABNORMAL 07/22/2007  . ABDOMINAL PAIN-GENERALIZED 07/22/2007    Past Surgical History:  Procedure Laterality Date  . APPENDECTOMY    . CARDIAC CATHETERIZATION N/A 03/21/2016   Procedure: Right/Left Heart  Cath and Coronary Angiography;  Surgeon: Belva Crome, MD;  Location: [facility];  Service: Cardiovascular;  Laterality: N/A;  . CHOLECYSTECTOMY    . GASTRECTOMY    . VENTRAL HERNIA REPAIR      OB History    No data available       Home Medications    Prior to Admission medications   Medication Sig Start Date End Date Taking? Authorizing Provider  albuterol (PROVENTIL HFA;VENTOLIN HFA) 108 (90 Base) MCG/ACT inhaler INHALE 2 PUFFS BY MOUTH  AS DIRECTED AS NEEDED FOR SHORTNESS OF BREATH 01/13/16   [provider]  amitriptyline (ELAVIL) 50 MG tablet Take 50 mg by mouth at bedtime.      [provider]  aspirin EC 81 MG tablet Take 1 tablet (81 mg total) by mouth daily. 03/17/16   Dorothy Spark, MD  budesonide-formoterol [facility]) 160-4.5 MCG/ACT inhaler Inhale 2 puffs into the lungs 2 (two) times daily as needed.     [provider]  clobetasol cream (TEMOVATE) 9.52 % Apply 1 application topically 2 (two) times daily as needed. APPLY TO AFFECTED FINGER 1 TO 2 TIMES D WITH AT LEAST 1 APPLICATION UNDER OCCLUSION    [provider]  conjugated estrogens (PREMARIN) vaginal cream Place 8.41 Applicatorfuls vaginally 3 (three) times a week. Pt only to use 1/4 of the applicator    [provider]  docusate sodium (COLACE) 100 MG capsule Take 100 mg by mouth daily as needed for mild constipation.  05/05/15   [provider]  emtricitabine-tenofovir (TRUVADA) 200-300 MG tablet TAKE 1 TABLET BY MOUTH EVERY DAY 09/03/15   [provider]  isosorbide mononitrate (IMDUR) 30 MG 24 hr tablet Take 1 tablet (30 mg total) by mouth daily. 03/21/16   Belva Crome, MD  nitroGLYCERIN (NITROSTAT) 0.4 MG SL tablet Place 1 tablet (0.4 mg total) under the tongue every 5 (five) minutes as needed for chest pain. 03/21/16   Belva Crome, MD  Pirfenidone (ESBRIET) 267 MG CAPS Take 2 capsules by mouth 2 (two) times daily.    [provider]  polyethylene glycol (MIRALAX / GLYCOLAX) packet Take 17 g by mouth daily as needed.     [provider]  pravastatin (PRAVACHOL) 80 MG tablet Take 1 tablet by mouth daily. 12/12/12   [provider]  raltegravir (ISENTRESS) 400 MG tablet TAKE 1 TABLET BY MOUTH TWICE DAILY 09/03/15   [provider]  senna-docusate (SENOKOT-S) 8.6-50 MG tablet Take 1 tablet by mouth daily as needed for mild constipation.  12/01/15   [provider]  sucralfate (CARAFATE) 1 g tablet Take 1 g by mouth 2 (two) times daily.  09/06/15   [provider]    Family History Family History  Problem Relation Age of Onset  . Coronary artery disease Unknown        family hx of on mother, father and siblings  . Bradycardia Sister   . Diabetes Mother   . Heart attack Mother   . Heart disease Mother   . Hypertension Mother   . Stroke Mother   . Bradycardia Brother     Social History Social History   Tobacco Use  . Smoking status: Former Smoker    Last attempt to quit: 03/13/1996    Years since quitting: 21.1  . Smokeless tobacco: Never Used  Substance Use Topics  . Alcohol use: No  . Drug use: No     Allergies   Sulfamethoxazole-trimethoprim  Review of Systems Review of Systems  Constitutional: Positive for fever.  Respiratory: Negative for shortness of breath.   Cardiovascular: Negative for chest pain.  Neurological: Positive for weakness. Negative for speech change, focal weakness and loss of balance.  All other systems reviewed and are negative.    Physical Exam Updated Vital Signs BP 124/72   Pulse 71   Temp 98.9 F (37.2 C) (Oral)   Resp 18   SpO2 98%   Physical Exam  Constitutional: She is oriented to person, place, and time. She appears well-developed and well-nourished. No distress.  HENT:  Head: Normocephalic and atraumatic.  Mouth/Throat: Oropharynx is clear and moist.  Eyes: Conjunctivae and EOM are normal. Pupils are equal, round, and reactive to light.  Neck: Normal range of motion. Neck supple.  Cardiovascular: Normal rate, regular rhythm and normal heart sounds.  Pulmonary/Chest: Effort normal and breath sounds normal. No respiratory distress.  Abdominal: Soft. She exhibits no distension. There is no tenderness.  Musculoskeletal: Normal range of motion. She exhibits no edema or deformity.  Neurological: She is alert and oriented to person, place, and time.  Skin: Skin is warm and dry.    Psychiatric: She has a normal mood and affect.  Nursing note and vitals reviewed.    ED Treatments / Results  Labs (all labs ordered are listed, but only abnormal results are displayed) Labs Reviewed  COMPREHENSIVE METABOLIC PANEL - Abnormal; Notable for the following components:      Result Value   Chloride 113 (*)    CO2 16 (*)    Glucose, Bld 110 (*)    Calcium 8.4 (*)    ALT 10 (*)    Total Bilirubin 0.2 (*)    All other components within normal limits  CBC - Abnormal; Notable for the following components:   WBC 11.1 (*)    All other components within normal limits  URINALYSIS, ROUTINE W REFLEX MICROSCOPIC - Abnormal; Notable for the following components:   APPearance HAZY (*)    Hgb urine dipstick SMALL (*)    Nitrite POSITIVE (*)    Leukocytes, UA TRACE (*)    Squamous Epithelial / LPF 0-5 (*)    All other components within normal limits  LIPASE, BLOOD  INFLUENZA PANEL BY PCR (TYPE A & B)    EKG  EKG Interpretation None       Radiology Dg Chest 2 View  Result Date: 04/28/2017 CLINICAL DATA:  Dry cough 3-4 days which shortness-of-breath and diffuse chest pain as well as intermittent lightheadedness beginning yesterday. Fever today. EXAM: CHEST  2 VIEW COMPARISON:  12/18/2016 FINDINGS: Lungs are somewhat hypoinflated without focal airspace consolidation or effusion. Cardiomediastinal silhouette and remainder the exam is unchanged. IMPRESSION: Hypoinflation without acute cardiopulmonary disease. Electronically Signed   By: Marin Olp M.D.   On: 04/28/2017 09:33    Procedures Procedures (including critical care time)  Medications Ordered in ED Medications  sodium chloride 0.9 % bolus 1,000 mL (not administered)  cefTRIAXone (ROCEPHIN) 1 g in sodium chloride 0.9 % 100 mL IVPB (not administered)     Initial Impression / Assessment and Plan / ED Course  I have reviewed the triage vital signs and the nursing notes.  Pertinent labs & imaging results that  were available during my care of the patient were reviewed by me and considered in my medical decision making (see chart for details).     MDM  Screen Complete  Patient is presenting with reported fever at home.  ED workup (Exam/Labs) is on the whole without significant findings.  Patient reports a prior history significant for frequent UTIs.  The UA today does not clearly suggest UTI however, given her prior history although I did not treat with antibiotics.  Patient reportedly feels significantly improved following IV fluids while in the ED.  She desires discharge home.  Close follow-up is advised.  Strict return precautions given and understood.    Final Clinical Impressions(s) / ED Diagnoses   Final diagnoses:  Fever, unspecified fever cause  Nausea    ED Discharge Orders    None       Valarie Merino, MD 04/28/17 1341

## 2017-04-28 NOTE — ED Triage Notes (Signed)
Pt reports that she started feeling bad yesterday. States she has n/v and fever/chills. Denies bodyaches but does reports cough/wheezing. Hx of asthma and pulmonary fibrosis. Airway intact at triage.

## 2017-12-11 HISTORY — PX: CORONARY STENT PLACEMENT: SHX1402

## 2017-12-31 ENCOUNTER — Telehealth (HOSPITAL_COMMUNITY): Payer: Self-pay | Admitting: *Deleted

## 2017-12-31 NOTE — Telephone Encounter (Signed)
Received referral from Dr. Marvel Plan at [facility] had DES to LAD on 10/1.  Reviewed discharge summary. Pt has upcoming appt on 11/5.support staff will send referral order form, ask for most recent 12 lead ekg and verify insurance.  Once received may contact pt to start after being seen in f/u 11/5 Will contact pt to advise of the rehab scheduling workflow. Cherre Huger, BSN Cardiac and Training and development officer

## 2018-01-03 ENCOUNTER — Telehealth (HOSPITAL_COMMUNITY): Payer: Self-pay

## 2018-01-03 NOTE — Telephone Encounter (Signed)
Pt insurance is active and benefits verified through Medicare A/B. Co-pay $0.00, DED $185.00/$185.00 met, out of pocket $0.00/$0.00 met, co-insurance 20%. No pre-authorization. Passport, 01/03/18 @ 9:42AM, REF# [id number]

## 2018-01-03 NOTE — Telephone Encounter (Signed)
Called patient to see if she was interested in participating in the Cardiac Rehab Program. Patient stated yes. Patient will come in for orientation on 03/05/18 @ 1:30PM and will attend the 2:45PM exercise class.  Mailed homework package.  Went over insurance, patient verbalized understanding.

## 2018-01-27 IMAGING — DX DG CHEST 2V
2 series · 2 of 2 positions shown · non-contrast
Comparison: 04/01/2008

CLINICAL DATA: Right-sided chest pain for 1 day

EXAM:
CHEST  2 VIEW

[w chest pa]
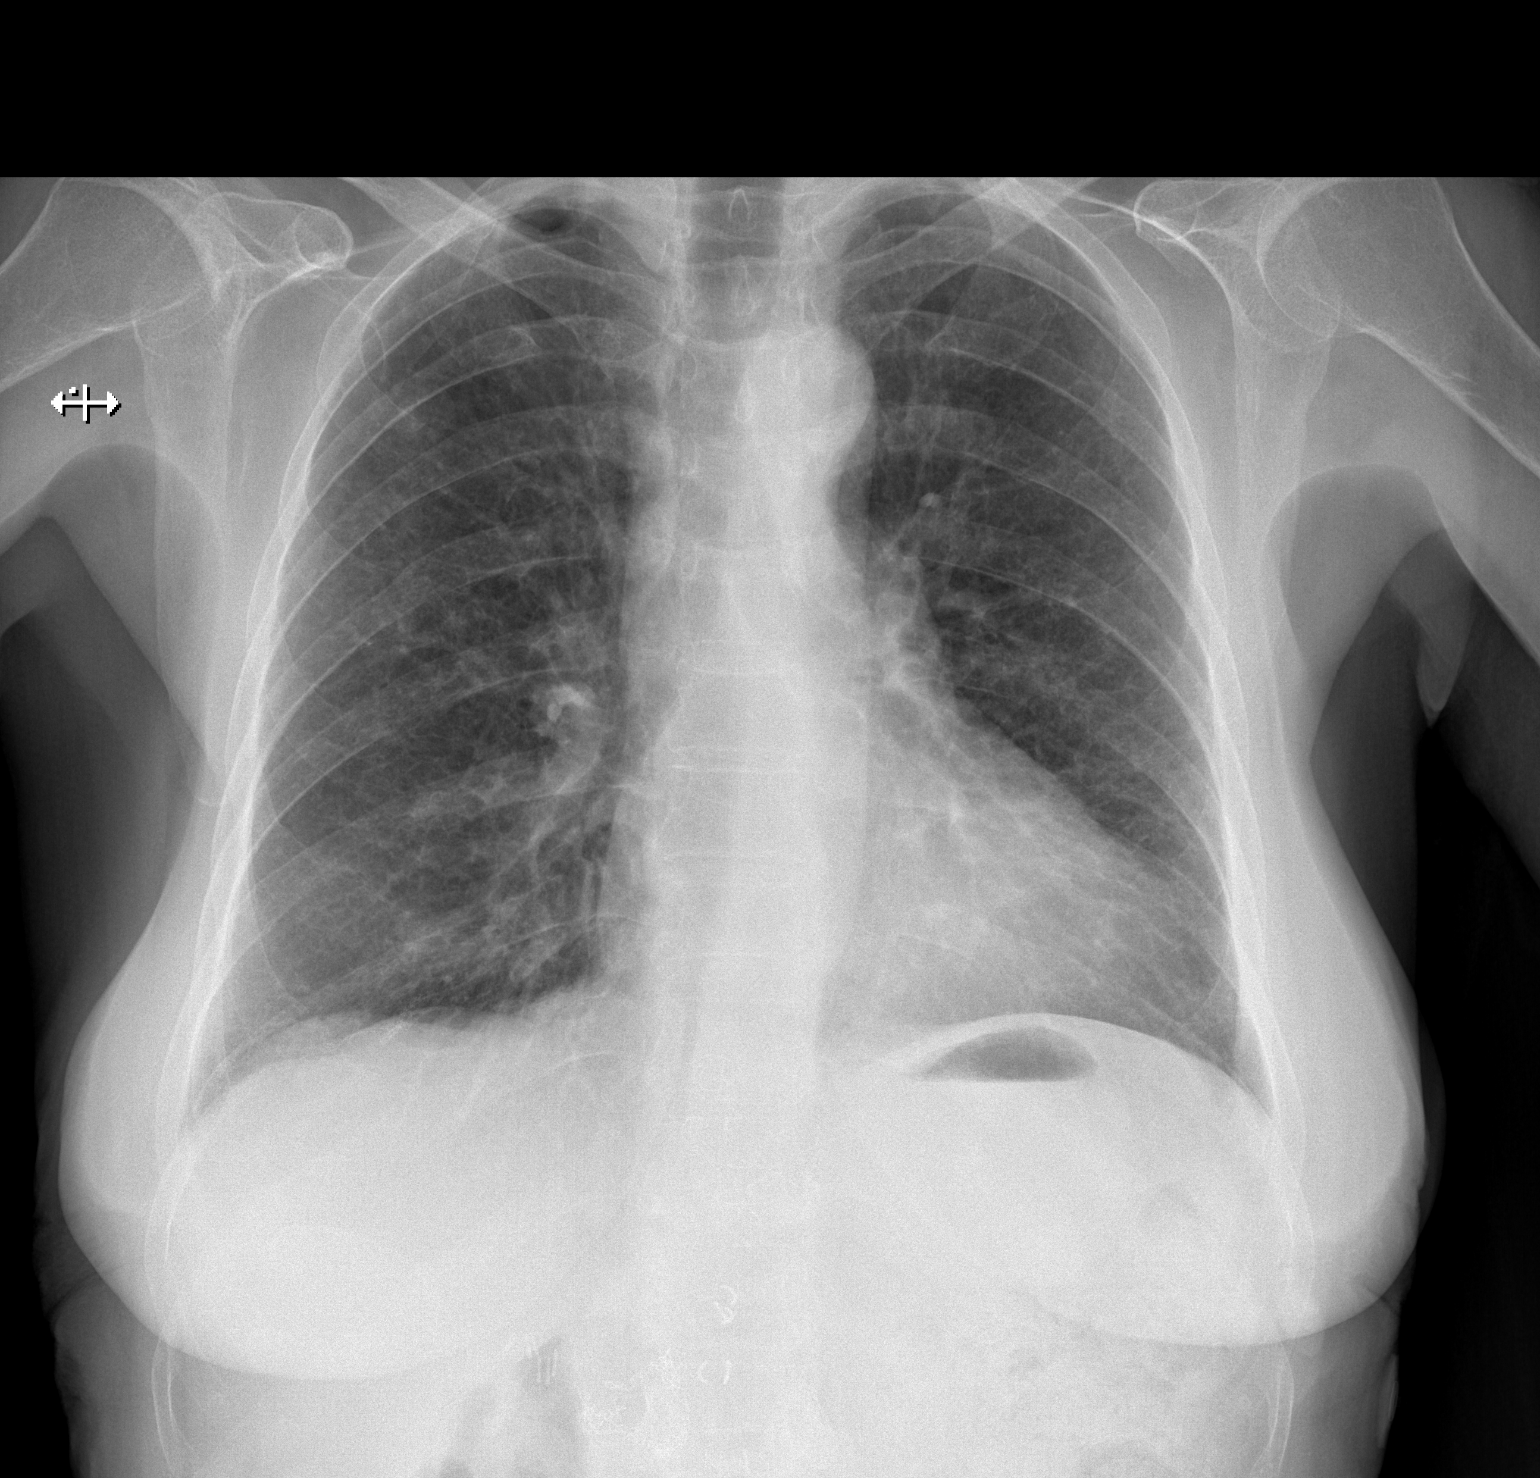

[w chest lat]
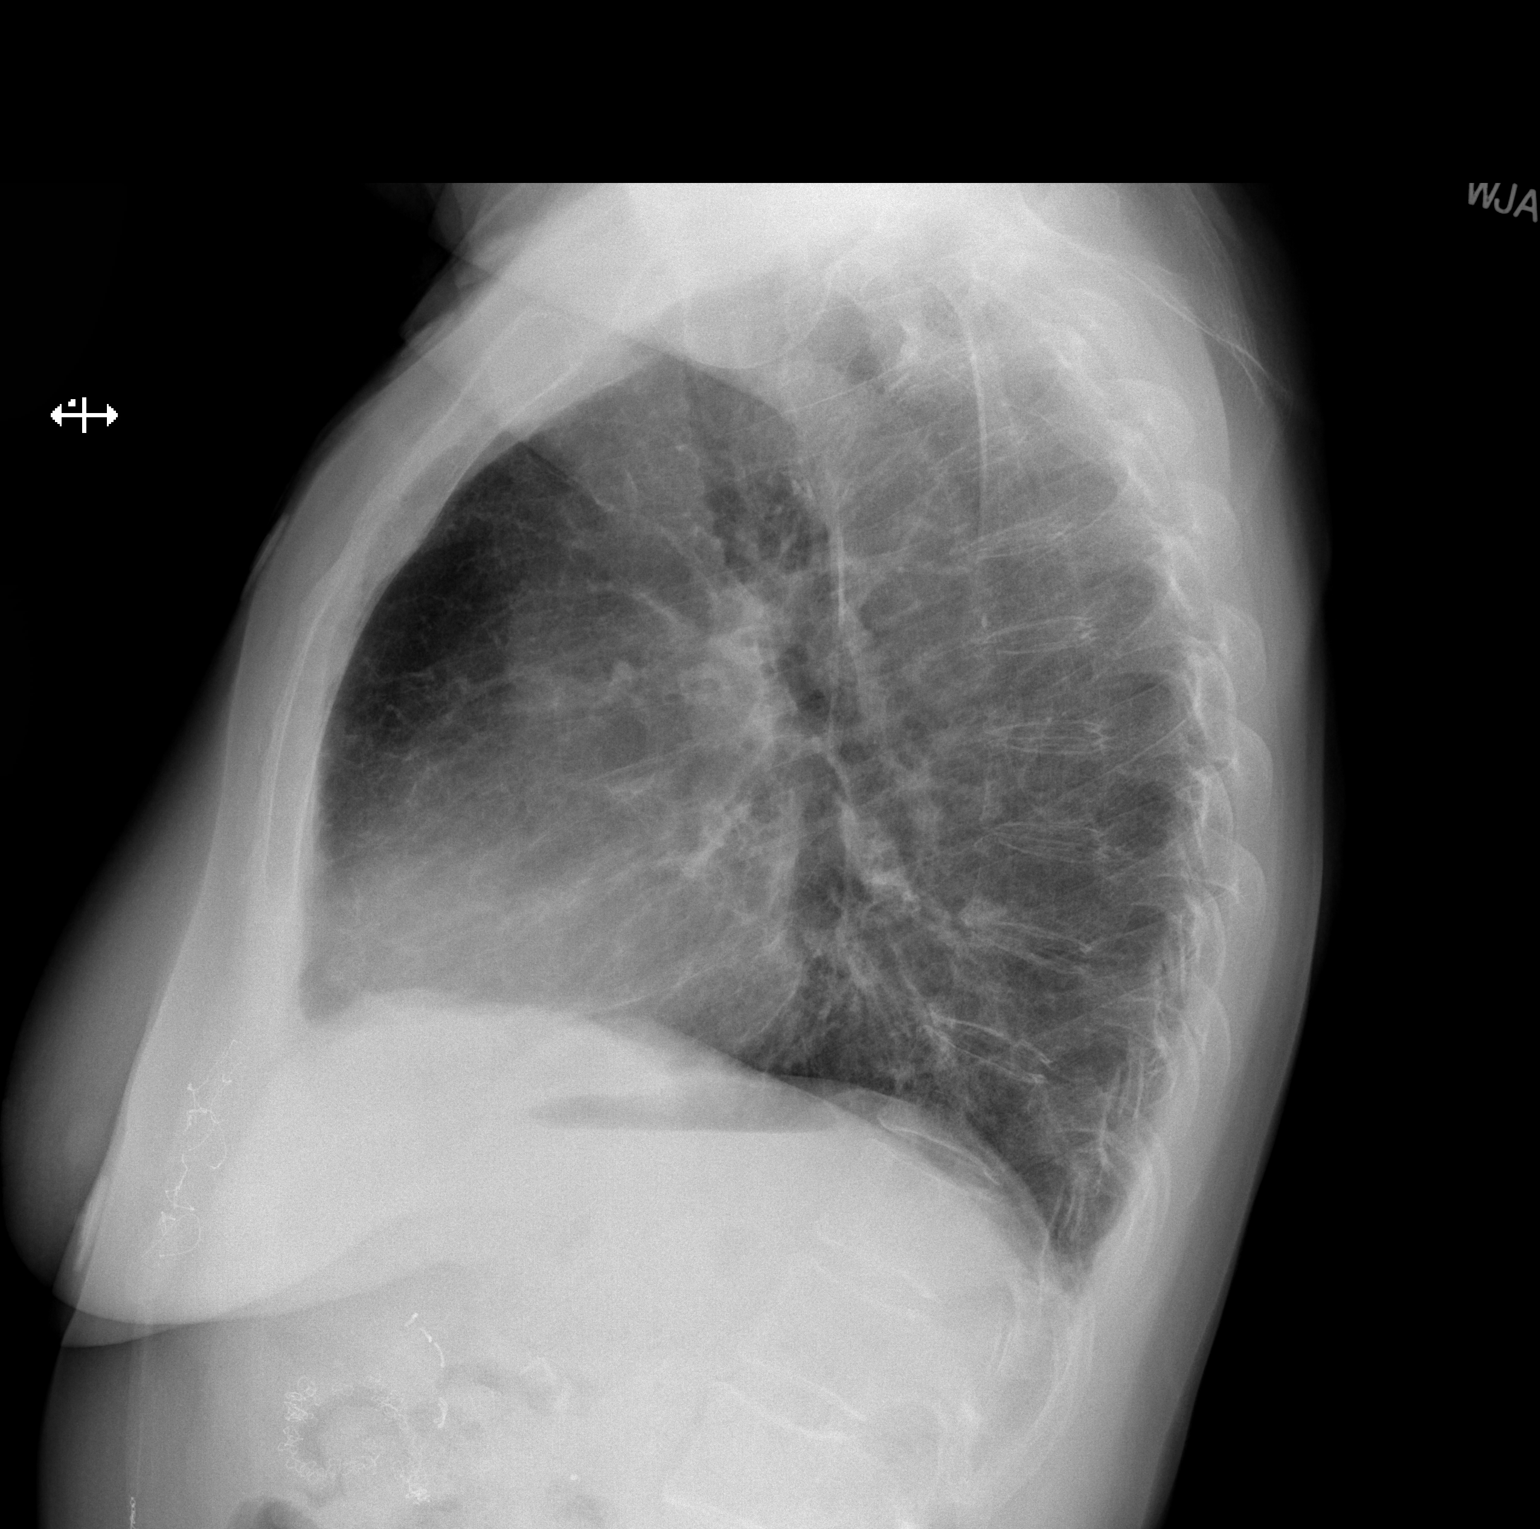

[2 of 2 positions shown; findings below may reference images not displayed]

FINDINGS: Heart size upper normal. Mild bilateral bronchitic change. This was
present on is mildly more prominent. No consolidation or effusion.
Possible 15 mm right upper lobe opacity.
IMPRESSION: Mild interval enlargement of the cardiac silhouette. Possible
pulmonary nodule. CT thorax recommended.

## 2018-02-15 ENCOUNTER — Telehealth (HOSPITAL_COMMUNITY): Payer: Self-pay

## 2018-02-25 ENCOUNTER — Telehealth (HOSPITAL_COMMUNITY): Payer: Self-pay | Admitting: Pharmacy Technician

## 2018-02-25 NOTE — Telephone Encounter (Signed)
Cardiac Rehab Medication Review by a Pharmacist  Does the patient  feel that his/her medications are working for him/her?  yes  Has the patient been experiencing any side effects to the medications prescribed?  no  Does the patient measure his/her own blood pressure or blood glucose at home?  yes   Does the patient have any problems obtaining medications due to transportation or finances?   no  Understanding of regimen: fair Understanding of indications: fair Potential of compliance: fair  Pharmacist comments: Patient checks blood pressure daily, usually measures 120/60 mmHg. No issues with obtaining medications.   Brendolyn Patty, PharmD PGY1 Pharmacy Resident Phone [phone]  02/25/2018   5:47 PM

## 2018-03-01 ENCOUNTER — Telehealth (HOSPITAL_COMMUNITY): Payer: Self-pay | Admitting: *Deleted

## 2018-03-01 NOTE — Telephone Encounter (Signed)
Pt called to confirm appointment for cardiac rehab orientation on 03/05/18 and obtain health history.  During health history, pt reports falling six times since Saturday 02/23/18 with no injury.  Pt has not called PCP for fear of being admitted to the hospital over the upcoming holiday.  Pt plans to call the PCP office "sometime next week."  Pt cannot pinpoint a precipitating event or symptom. Does not endorse lightheadedness, dizziness, "seeing spots", or blacking out.  Pt encouraged to call PCP office on Monday to report frequency of falls.  Will f/u with patient on Monday 03/04/18.  Pt will be rescheduling cardiac rehab orientation date.  Message left for support staff.

## 2018-03-04 NOTE — Progress Notes (Signed)
[name] [name] 67 y.o. female DOB December 17, 1950 MRN [id number]       [name]  No diagnosis found. Past Medical History:  Diagnosis Date  . Asthma   . Bradycardia    Sinus bradycardia  . CAD (coronary artery disease)    Minimal by history, Question coronary artery spasm  . Colon polyps   . Dyslipidemia   . Ejection fraction   . GERD (gastroesophageal reflux disease)    Esophageal stricture  . H/O: hysterectomy   . HIV positive ([name] [name])    Needle stick 1993  . Hyperlipidemia   . Palpitations   . Peptic ulcer disease   . Shortness of breath    Extensive evaluation Dr. Winona [name], [facility], May, 6063, etiology uncertain, probable bronchiolitis possibility of lung biopsy at that time  . Weight loss    Meds reviewed.     Current Outpatient Medications (Cardiovascular):  .  atorvastatin (LIPITOR) 40 MG tablet, Take 40 mg by mouth daily. .  isosorbide mononitrate (IMDUR) 30 MG 24 hr tablet, Take 1 tablet (30 mg total) by mouth daily. (Patient not taking: Reported on 02/25/2018) .  metoprolol tartrate (LOPRESSOR) 25 MG tablet, Take 12.5 mg by mouth 2 (two) times daily. .  nitroGLYCERIN (NITROSTAT) 0.4 MG SL tablet, Place 1 tablet (0.4 mg total) under the tongue every 5 (five) minutes as needed for chest pain. .  pravastatin (PRAVACHOL) 80 MG tablet, Take 1 tablet by mouth daily.  Current Outpatient Medications (Respiratory):  .  albuterol (PROVENTIL HFA;VENTOLIN HFA) 108 (90 Base) MCG/ACT inhaler, Inhale 2 puffs into the lungs every 4 (four) hours as needed for shortness of breath.  .  budesonide-formoterol (SYMBICORT) 160-4.5 MCG/ACT inhaler, Inhale 2 puffs into the lungs 2 (two) times daily.  .  Pirfenidone (ESBRIET) 267 MG CAPS, Take 2 capsules by mouth 2 (two) times daily.  Current Outpatient Medications (Analgesics):  .  aspirin EC 81 MG tablet, Take 1 tablet (81 mg total) by mouth daily.  Current Outpatient Medications (Hematological):  .  clopidogrel (PLAVIX) 75 MG tablet, Take  75 mg by mouth daily.  Current Outpatient Medications (Other):  .  amitriptyline (ELAVIL) 50 MG tablet, Take 50 mg by mouth at bedtime.   .  cephALEXin (KEFLEX) 500 MG capsule, Take 1 capsule (500 mg total) by mouth 4 (four) times daily. (Patient not taking: Reported on 02/25/2018) .  clobetasol cream (TEMOVATE) 0.16 %, Apply 1 application topically 2 (two) times daily as needed. APPLY TO AFFECTED FINGER 1 TO 2 TIMES D WITH AT LEAST 1 APPLICATION UNDER OCCLUSION .  conjugated estrogens (PREMARIN) vaginal cream, Place 0.10 Applicatorfuls vaginally 3 (three) times a week. Pt only to use 1/4 of the applicator .  docusate sodium (COLACE) 100 MG capsule, Take 100 mg by mouth daily as needed for mild constipation.  .  Dolutegravir-lamiVUDine 50-300 MG TABS, Take 1 tablet by mouth daily. Marland Kitchen  emtricitabine-tenofovir (TRUVADA) 200-300 MG tablet, TAKE 1 TABLET BY MOUTH EVERY DAY .  ondansetron (ZOFRAN) 4 MG tablet, Take 1 tablet (4 mg total) by mouth every 8 (eight) hours as needed for nausea or vomiting. (Patient not taking: Reported on 02/25/2018) .  polyethylene glycol (MIRALAX / GLYCOLAX) packet, Take 17 g by mouth daily as needed.  .  raltegravir (ISENTRESS) 400 MG tablet, TAKE 1 TABLET BY MOUTH TWICE DAILY .  senna-docusate (SENOKOT-S) 8.6-50 MG tablet, Take 1 tablet by mouth daily as needed for mild constipation.  .  sucralfate (CARAFATE) 1 g tablet, Take 1 g  by mouth 2 (two) times daily.    HT: Ht Readings from Last 1 Encounters:  06/26/16 4\' 11"  (1.499 m)    WT: Wt Readings from Last 5 Encounters:  09/26/16 143 lb 11.8 oz (65.2 kg)  09/21/16 141 lb 8.6 oz (64.2 kg)  09/19/16 141 lb 12.1 oz (64.3 kg)  09/14/16 140 lb 6.9 oz (63.7 kg)  09/12/16 140 lb 14 oz (63.9 kg)     There is no height or weight on file to calculate BMI.  Current tobacco use? No       Labs:  Lipid Panel  No results found for: CHOL, TRIG, HDL, CHOLHDL, VLDL, LDLCALC, LDLDIRECT  No results found for: HGBA1C CBG  (last 3)  No results for input(s): GLUCAP in the last 72 hours.  [name] Diagnosis ? Food-and [name]-related knowledge deficit related to lack of exposure to information as related to diagnosis of: ? CVD   [name] Goal(s):  ? To be determined  Plan:  Pt to attend [name] classes ? [name] [name] ? [name] [name] ? Portion Distortion  Will provide client-centered [name] education as part of interdisciplinary care.   Monitor and evaluate progress toward [name] goal with team.  Laurina Bustle, MS, RD, LDN 03/04/2018 11:35 AM

## 2018-03-05 ENCOUNTER — Ambulatory Visit (HOSPITAL_COMMUNITY): Payer: Self-pay

## 2018-03-05 ENCOUNTER — Telehealth (HOSPITAL_COMMUNITY): Payer: Self-pay

## 2018-03-05 ENCOUNTER — Inpatient Hospital Stay (HOSPITAL_COMMUNITY)
Admission: RE | Admit: 2018-03-05 | Discharge: 2018-03-05 | Disposition: A | Discharge status: Home / Self Care | Payer: Self-pay | Source: Ambulatory Visit

## 2018-03-05 NOTE — Telephone Encounter (Signed)
Attempted to contact pt to reschedule for CR, unable to leave VM.

## 2018-03-11 ENCOUNTER — Ambulatory Visit (HOSPITAL_COMMUNITY): Payer: Self-pay

## 2018-03-12 ENCOUNTER — Telehealth (HOSPITAL_COMMUNITY): Payer: Self-pay

## 2018-03-12 ENCOUNTER — Encounter (HOSPITAL_COMMUNITY): Payer: Self-pay

## 2018-03-12 NOTE — Telephone Encounter (Signed)
Attempted to call pt a 2nd time- unable to leave voicemail.  Mailed letter

## 2018-03-15 ENCOUNTER — Ambulatory Visit (HOSPITAL_COMMUNITY): Payer: Self-pay

## 2018-03-18 ENCOUNTER — Ambulatory Visit (HOSPITAL_COMMUNITY): Payer: Self-pay

## 2018-03-20 ENCOUNTER — Ambulatory Visit (HOSPITAL_COMMUNITY): Payer: Self-pay

## 2018-03-22 ENCOUNTER — Ambulatory Visit (HOSPITAL_COMMUNITY): Payer: Self-pay

## 2018-03-25 ENCOUNTER — Ambulatory Visit (HOSPITAL_COMMUNITY): Payer: Self-pay

## 2018-03-27 ENCOUNTER — Ambulatory Visit (HOSPITAL_COMMUNITY): Payer: Self-pay

## 2018-03-29 ENCOUNTER — Ambulatory Visit (HOSPITAL_COMMUNITY): Payer: Self-pay

## 2018-04-01 ENCOUNTER — Ambulatory Visit (HOSPITAL_COMMUNITY): Payer: Self-pay

## 2018-04-02 ENCOUNTER — Telehealth (HOSPITAL_COMMUNITY): Payer: Self-pay

## 2018-04-02 NOTE — Telephone Encounter (Signed)
Attempted to call pt a 3rd time- LM ON VM

## 2018-04-03 ENCOUNTER — Ambulatory Visit (HOSPITAL_COMMUNITY): Payer: Self-pay

## 2018-04-05 ENCOUNTER — Ambulatory Visit (HOSPITAL_COMMUNITY): Payer: Self-pay

## 2018-04-08 ENCOUNTER — Ambulatory Visit (HOSPITAL_COMMUNITY): Payer: Self-pay

## 2018-04-10 ENCOUNTER — Ambulatory Visit (HOSPITAL_COMMUNITY): Payer: Self-pay

## 2018-04-12 ENCOUNTER — Ambulatory Visit (HOSPITAL_COMMUNITY): Payer: Self-pay

## 2018-04-15 ENCOUNTER — Ambulatory Visit (HOSPITAL_COMMUNITY): Payer: Self-pay

## 2018-04-17 ENCOUNTER — Telehealth (HOSPITAL_COMMUNITY): Payer: Self-pay

## 2018-04-17 ENCOUNTER — Ambulatory Visit (HOSPITAL_COMMUNITY): Payer: Self-pay

## 2018-04-17 NOTE — Telephone Encounter (Signed)
No response from pt.  Closed referral  

## 2018-04-19 ENCOUNTER — Ambulatory Visit (HOSPITAL_COMMUNITY): Payer: Self-pay

## 2018-04-21 ENCOUNTER — Inpatient Hospital Stay (HOSPITAL_COMMUNITY)
Admission: EM | Admit: 2018-04-21 | Discharge: 2018-04-24 | DRG: 193 | Disposition: A | Discharge status: Home Health | Payer: Medicare Other | Attending: Internal Medicine | Admitting: Internal Medicine

## 2018-04-21 ENCOUNTER — Other Ambulatory Visit: Payer: Self-pay

## 2018-04-21 ENCOUNTER — Emergency Department (HOSPITAL_COMMUNITY): Payer: Medicare Other

## 2018-04-21 ENCOUNTER — Observation Stay (HOSPITAL_COMMUNITY): Payer: Medicare Other

## 2018-04-21 DIAGNOSIS — E785 Hyperlipidemia, unspecified: Secondary | ICD-10-CM | POA: Diagnosis not present

## 2018-04-21 DIAGNOSIS — J09X2 Influenza due to identified novel influenza A virus with other respiratory manifestations: Secondary | ICD-10-CM | POA: Diagnosis present

## 2018-04-21 DIAGNOSIS — I700 Atherosclerosis of aorta: Secondary | ICD-10-CM | POA: Diagnosis present

## 2018-04-21 DIAGNOSIS — Z882 Allergy status to sulfonamides status: Secondary | ICD-10-CM

## 2018-04-21 DIAGNOSIS — R402252 Coma scale, best verbal response, oriented, at arrival to emergency department: Secondary | ICD-10-CM | POA: Diagnosis present

## 2018-04-21 DIAGNOSIS — M810 Age-related osteoporosis without current pathological fracture: Secondary | ICD-10-CM | POA: Diagnosis present

## 2018-04-21 DIAGNOSIS — Z833 Family history of diabetes mellitus: Secondary | ICD-10-CM

## 2018-04-21 DIAGNOSIS — J9601 Acute respiratory failure with hypoxia: Secondary | ICD-10-CM | POA: Diagnosis present

## 2018-04-21 DIAGNOSIS — I4510 Unspecified right bundle-branch block: Secondary | ICD-10-CM | POA: Diagnosis present

## 2018-04-21 DIAGNOSIS — K5900 Constipation, unspecified: Secondary | ICD-10-CM | POA: Diagnosis present

## 2018-04-21 DIAGNOSIS — Z8619 Personal history of other infectious and parasitic diseases: Secondary | ICD-10-CM

## 2018-04-21 DIAGNOSIS — K449 Diaphragmatic hernia without obstruction or gangrene: Secondary | ICD-10-CM | POA: Diagnosis present

## 2018-04-21 DIAGNOSIS — R402362 Coma scale, best motor response, obeys commands, at arrival to emergency department: Secondary | ICD-10-CM | POA: Diagnosis present

## 2018-04-21 DIAGNOSIS — J84112 Idiopathic pulmonary fibrosis: Secondary | ICD-10-CM | POA: Diagnosis present

## 2018-04-21 DIAGNOSIS — Z87891 Personal history of nicotine dependence: Secondary | ICD-10-CM

## 2018-04-21 DIAGNOSIS — Z8249 Family history of ischemic heart disease and other diseases of the circulatory system: Secondary | ICD-10-CM

## 2018-04-21 DIAGNOSIS — Z21 Asymptomatic human immunodeficiency virus [HIV] infection status: Secondary | ICD-10-CM | POA: Diagnosis present

## 2018-04-21 DIAGNOSIS — I5033 Acute on chronic diastolic (congestive) heart failure: Secondary | ICD-10-CM | POA: Diagnosis present

## 2018-04-21 DIAGNOSIS — K219 Gastro-esophageal reflux disease without esophagitis: Secondary | ICD-10-CM | POA: Diagnosis not present

## 2018-04-21 DIAGNOSIS — I2510 Atherosclerotic heart disease of native coronary artery without angina pectoris: Secondary | ICD-10-CM | POA: Diagnosis present

## 2018-04-21 DIAGNOSIS — B20 Human immunodeficiency virus [HIV] disease: Secondary | ICD-10-CM | POA: Diagnosis present

## 2018-04-21 DIAGNOSIS — Z7902 Long term (current) use of antithrombotics/antiplatelets: Secondary | ICD-10-CM

## 2018-04-21 DIAGNOSIS — I110 Hypertensive heart disease with heart failure: Secondary | ICD-10-CM | POA: Diagnosis present

## 2018-04-21 DIAGNOSIS — R1084 Generalized abdominal pain: Secondary | ICD-10-CM

## 2018-04-21 DIAGNOSIS — J101 Influenza due to other identified influenza virus with other respiratory manifestations: Principal | ICD-10-CM | POA: Diagnosis present

## 2018-04-21 DIAGNOSIS — Z79899 Other long term (current) drug therapy: Secondary | ICD-10-CM

## 2018-04-21 DIAGNOSIS — Z823 Family history of stroke: Secondary | ICD-10-CM

## 2018-04-21 DIAGNOSIS — J45901 Unspecified asthma with (acute) exacerbation: Secondary | ICD-10-CM | POA: Diagnosis present

## 2018-04-21 DIAGNOSIS — R109 Unspecified abdominal pain: Secondary | ICD-10-CM | POA: Insufficient documentation

## 2018-04-21 DIAGNOSIS — A419 Sepsis, unspecified organism: Secondary | ICD-10-CM | POA: Insufficient documentation

## 2018-04-21 DIAGNOSIS — Z79890 Hormone replacement therapy (postmenopausal): Secondary | ICD-10-CM

## 2018-04-21 DIAGNOSIS — R402142 Coma scale, eyes open, spontaneous, at arrival to emergency department: Secondary | ICD-10-CM | POA: Diagnosis present

## 2018-04-21 DIAGNOSIS — E876 Hypokalemia: Secondary | ICD-10-CM | POA: Diagnosis not present

## 2018-04-21 DIAGNOSIS — R509 Fever, unspecified: Secondary | ICD-10-CM | POA: Diagnosis not present

## 2018-04-21 DIAGNOSIS — Z7982 Long term (current) use of aspirin: Secondary | ICD-10-CM

## 2018-04-21 DIAGNOSIS — Z9049 Acquired absence of other specified parts of digestive tract: Secondary | ICD-10-CM

## 2018-04-21 DIAGNOSIS — Z7951 Long term (current) use of inhaled steroids: Secondary | ICD-10-CM

## 2018-04-21 DIAGNOSIS — Z888 Allergy status to other drugs, medicaments and biological substances status: Secondary | ICD-10-CM

## 2018-04-21 HISTORY — DX: Unspecified asthma with (acute) exacerbation: J45.901

## 2018-04-21 LAB — COMPREHENSIVE METABOLIC PANEL
ALK PHOS: 102 U/L (ref 38–126)
ALT: 17 U/L (ref 0–44)
AST: 22 U/L (ref 15–41)
Albumin: 3.4 g/dL — ABNORMAL LOW (ref 3.5–5.0)
Anion gap: 11 (ref 5–15)
BUN: 9 mg/dL (ref 8–23)
CALCIUM: 8.1 mg/dL — AB (ref 8.9–10.3)
CO2: 17 mmol/L — ABNORMAL LOW (ref 22–32)
Chloride: 103 mmol/L (ref 98–111)
Creatinine, Ser: 0.98 mg/dL (ref 0.44–1.00)
GFR calc Af Amer: >60 mL/min (ref >60)
GFR calc non Af Amer: >60 mL/min (ref >60)
Glucose, Bld: 134 mg/dL — ABNORMAL HIGH (ref 70–99)
Potassium: 3.7 mmol/L (ref 3.5–5.1)
Sodium: 131 mmol/L — ABNORMAL LOW (ref 135–145)
Total Bilirubin: 1 mg/dL (ref 0.3–1.2)
Total Protein: 6.9 g/dL (ref 6.5–8.1)

## 2018-04-21 LAB — CBC WITH DIFFERENTIAL/PLATELET
Abs Immature Granulocytes: 0.05 10*3/uL (ref 0.00–0.07)
Basophils Absolute: 0 10*3/uL (ref 0.0–0.1)
Basophils Relative: 0 %
EOS ABS: 0 10*3/uL (ref 0.0–0.5)
Eosinophils Relative: 0 %
HCT: 42.8 % (ref 36.0–46.0)
HEMOGLOBIN: 13.4 g/dL (ref 12.0–15.0)
Immature Granulocytes: 1 %
LYMPHS ABS: 0.6 10*3/uL — AB (ref 0.7–4.0)
Lymphocytes Relative: 7 %
MCH: 31.3 pg (ref 26.0–34.0)
MCHC: 31.3 g/dL (ref 30.0–36.0)
MCV: 100 fL (ref 80.0–100.0)
Monocytes Absolute: 1.5 10*3/uL — ABNORMAL HIGH (ref 0.1–1.0)
Monocytes Relative: 18 %
Neutro Abs: 6.1 10*3/uL (ref 1.7–7.7)
Neutrophils Relative %: 74 %
Platelets: 236 10*3/uL (ref 150–400)
RBC: 4.28 MIL/uL (ref 3.87–5.11)
RDW: 13.8 % (ref 11.5–15.5)
WBC: 8.3 10*3/uL (ref 4.0–10.5)
nRBC: 0 % (ref 0.0–0.2)

## 2018-04-21 LAB — LIPASE, BLOOD: Lipase: 25 U/L (ref 11–51)

## 2018-04-21 LAB — INFLUENZA PANEL BY PCR (TYPE A & B)
Influenza A By PCR: POSITIVE — AB
Influenza B By PCR: NEGATIVE

## 2018-04-21 LAB — BRAIN NATRIURETIC PEPTIDE: B Natriuretic Peptide: 27.9 pg/mL (ref 0.0–100.0)

## 2018-04-21 LAB — LACTIC ACID, PLASMA: Lactic Acid, Venous: 1.3 mmol/L (ref 0.5–1.9)

## 2018-04-21 MED ORDER — SODIUM CHLORIDE 0.9 % IV SOLN: 2.0000 g | INTRAVENOUS | Status: DC

## 2018-04-21 MED ORDER — ENOXAPARIN SODIUM 40 MG/0.4ML ~~LOC~~ SOLN
40.0000 mg | Freq: Every day | SUBCUTANEOUS | Status: DC
  Administered 2018-04-22 – 2018-04-23 (×2): 40 mg via SUBCUTANEOUS
  Filled 2018-04-21 (×3): qty 0.4

## 2018-04-21 MED ORDER — DOLUTEGRAVIR-LAMIVUDINE 50-300 MG PO TABS: 1.0000 | ORAL_TABLET | Freq: Every day | ORAL | Status: DC

## 2018-04-21 MED ORDER — SODIUM CHLORIDE 0.9 % IV SOLN
2.0000 g | Freq: Once | INTRAVENOUS | Status: AC
  Administered 2018-04-21: 2 g via INTRAVENOUS
  Filled 2018-04-21: qty 2

## 2018-04-21 MED ORDER — PANTOPRAZOLE SODIUM 40 MG PO TBEC
40.0000 mg | DELAYED_RELEASE_TABLET | Freq: Every day | ORAL | Status: DC
  Administered 2018-04-22 – 2018-04-24 (×3): 40 mg via ORAL
  Filled 2018-04-21 (×3): qty 1

## 2018-04-21 MED ORDER — HYDRALAZINE HCL 20 MG/ML IJ SOLN: 5.0000 mg | INTRAMUSCULAR | Status: DC | PRN

## 2018-04-21 MED ORDER — ONDANSETRON HCL 4 MG/2ML IJ SOLN
4.0000 mg | Freq: Four times a day (QID) | INTRAMUSCULAR | Status: DC | PRN
  Administered 2018-04-22 – 2018-04-23 (×5): 4 mg via INTRAVENOUS
  Filled 2018-04-21 (×5): qty 2

## 2018-04-21 MED ORDER — VANCOMYCIN HCL 10 G IV SOLR: 1250.0000 mg | INTRAVENOUS | Status: DC

## 2018-04-21 MED ORDER — CLOPIDOGREL BISULFATE 75 MG PO TABS
75.0000 mg | ORAL_TABLET | Freq: Every day | ORAL | Status: DC
  Administered 2018-04-22 – 2018-04-24 (×3): 75 mg via ORAL
  Filled 2018-04-21 (×3): qty 1

## 2018-04-21 MED ORDER — LEVALBUTEROL HCL 1.25 MG/0.5ML IN NEBU
1.2500 mg | INHALATION_SOLUTION | Freq: Four times a day (QID) | RESPIRATORY_TRACT | Status: DC
  Administered 2018-04-22 – 2018-04-23 (×6): 1.25 mg via RESPIRATORY_TRACT
  Filled 2018-04-21 (×7): qty 0.5

## 2018-04-21 MED ORDER — SODIUM CHLORIDE 0.9 % IV SOLN
INTRAVENOUS | Status: DC
  Administered 2018-04-22: 01:00:00 via INTRAVENOUS

## 2018-04-21 MED ORDER — NITROGLYCERIN 0.4 MG SL SUBL: 0.4000 mg | SUBLINGUAL_TABLET | SUBLINGUAL | Status: DC | PRN

## 2018-04-21 MED ORDER — ASPIRIN EC 81 MG PO TBEC
81.0000 mg | DELAYED_RELEASE_TABLET | Freq: Every day | ORAL | Status: DC
  Administered 2018-04-22 – 2018-04-24 (×3): 81 mg via ORAL
  Filled 2018-04-21 (×3): qty 1

## 2018-04-21 MED ORDER — VANCOMYCIN HCL 500 MG IV SOLR
500.0000 mg | Freq: Once | INTRAVENOUS | Status: AC
  Administered 2018-04-22: 500 mg via INTRAVENOUS
  Filled 2018-04-21: qty 500

## 2018-04-21 MED ORDER — METRONIDAZOLE IN NACL 5-0.79 MG/ML-% IV SOLN
500.0000 mg | Freq: Three times a day (TID) | INTRAVENOUS | Status: DC
  Administered 2018-04-21: 500 mg via INTRAVENOUS
  Filled 2018-04-21: qty 100

## 2018-04-21 MED ORDER — SENNOSIDES-DOCUSATE SODIUM 8.6-50 MG PO TABS: 1.0000 | ORAL_TABLET | Freq: Every evening | ORAL | Status: DC | PRN

## 2018-04-21 MED ORDER — ACETAMINOPHEN 650 MG RE SUPP: 650.0000 mg | Freq: Four times a day (QID) | RECTAL | Status: DC | PRN

## 2018-04-21 MED ORDER — LACTATED RINGERS IV BOLUS: 1000.0000 mL | Freq: Once | INTRAVENOUS | Status: DC

## 2018-04-21 MED ORDER — ATORVASTATIN CALCIUM 40 MG PO TABS
40.0000 mg | ORAL_TABLET | Freq: Every day | ORAL | Status: DC
  Administered 2018-04-22 – 2018-04-24 (×3): 40 mg via ORAL
  Filled 2018-04-21 (×3): qty 1

## 2018-04-21 MED ORDER — ACETAMINOPHEN 325 MG PO TABS
650.0000 mg | ORAL_TABLET | Freq: Four times a day (QID) | ORAL | Status: DC | PRN
  Filled 2018-04-21: qty 2

## 2018-04-21 MED ORDER — AMITRIPTYLINE HCL 25 MG PO TABS
50.0000 mg | ORAL_TABLET | Freq: Every day | ORAL | Status: DC
  Administered 2018-04-22 – 2018-04-23 (×3): 50 mg via ORAL
  Filled 2018-04-21 (×3): qty 2

## 2018-04-21 MED ORDER — VANCOMYCIN HCL IN DEXTROSE 1-5 GM/200ML-% IV SOLN
1000.0000 mg | Freq: Once | INTRAVENOUS | Status: AC
  Administered 2018-04-21: 1000 mg via INTRAVENOUS
  Filled 2018-04-21: qty 200

## 2018-04-21 MED ORDER — IPRATROPIUM BROMIDE 0.02 % IN SOLN
0.5000 mg | RESPIRATORY_TRACT | Status: DC
  Administered 2018-04-22 (×3): 0.5 mg via RESPIRATORY_TRACT
  Filled 2018-04-21 (×4): qty 2.5

## 2018-04-21 MED ORDER — ONDANSETRON HCL 4 MG PO TABS: 4.0000 mg | ORAL_TABLET | Freq: Four times a day (QID) | ORAL | Status: DC | PRN

## 2018-04-21 MED ORDER — METHYLPREDNISOLONE SODIUM SUCC 125 MG IJ SOLR
60.0000 mg | Freq: Two times a day (BID) | INTRAMUSCULAR | Status: DC
  Administered 2018-04-22 – 2018-04-24 (×6): 60 mg via INTRAVENOUS
  Filled 2018-04-21 (×6): qty 2

## 2018-04-21 MED ORDER — LACTATED RINGERS IV BOLUS
1000.0000 mL | Freq: Once | INTRAVENOUS | Status: AC
  Administered 2018-04-21: 1000 mL via INTRAVENOUS

## 2018-04-21 MED ORDER — DOCUSATE SODIUM 100 MG PO CAPS: 100.0000 mg | ORAL_CAPSULE | Freq: Every day | ORAL | Status: DC | PRN

## 2018-04-21 MED ORDER — PIRFENIDONE 267 MG PO CAPS
534.0000 mg | ORAL_CAPSULE | Freq: Two times a day (BID) | ORAL | Status: DC
  Filled 2018-04-21 (×2): qty 267

## 2018-04-21 MED ORDER — MOMETASONE FURO-FORMOTEROL FUM 200-5 MCG/ACT IN AERO
2.0000 | INHALATION_SPRAY | Freq: Two times a day (BID) | RESPIRATORY_TRACT | Status: DC
  Administered 2018-04-22 – 2018-04-24 (×4): 2 via RESPIRATORY_TRACT
  Filled 2018-04-21 (×2): qty 8.8

## 2018-04-21 MED ORDER — DM-GUAIFENESIN ER 30-600 MG PO TB12
1.0000 | ORAL_TABLET | Freq: Two times a day (BID) | ORAL | Status: DC | PRN
  Administered 2018-04-22: 1 via ORAL
  Filled 2018-04-21: qty 1

## 2018-04-21 NOTE — ED Provider Notes (Signed)
[facility] EMERGENCY DEPARTMENT Provider Note   CSN: [id number] Arrival date & time: 04/21/18  1902     History   Chief Complaint No chief complaint on file.   HPI [name] [name] is a 68 y.o. female.  HPI  [name] [name] is a 68 y.o. female with PMH of, sinus bradycardia status, CAD, colon polyps, dyslipidemia, GERD, HIV on antiretroviral therapy with last CD4 count 290 in early January, HLD, palpitations, PUD, dyspnea who presents with 3 days of fever up to 103 to 104 F, myalgias and arthralgias, nausea, no vomiting but decreased appetite and cough which is nonproductive.  Mild shortness of breath.  Lower abdominal discomfort which is migratory and not consistent.  No pain radiating to the back and no dysuria or frequency.  No hematuria.  No melena or hematochezia.  Passing flatus and having normal stools.  No history of AIDS defining illness and takes no antibiotics at this time.  Past Medical History:  Diagnosis Date  . Asthma   . Bradycardia    Sinus bradycardia  . CAD (coronary artery disease)    Minimal by history, Question coronary artery spasm  . Colon polyps   . Dyslipidemia   . Ejection fraction   . GERD (gastroesophageal reflux disease)    Esophageal stricture  . H/O: hysterectomy   . HIV positive ([place])    Needle stick 1993  . Hyperlipidemia   . Palpitations   . Peptic ulcer disease   . Shortness of breath    Extensive evaluation Dr. Winona Legato, [facility], May, 6294, etiology uncertain, probable bronchiolitis possibility of lung biopsy at that time  . Weight loss     Patient Active Problem List   Diagnosis Date Noted  . HIV (human immunodeficiency virus infection) ([place]) 04/21/2018  . Sepsis ([place]) 04/21/2018  . Abdominal pain 04/21/2018  . Asthma exacerbation 04/21/2018  . Anxiety state 03/17/2016  . Benign neoplasm of colon 03/17/2016  . Human immunodeficiency virus (HIV) disease ([place]) 03/17/2016  . Nontoxic uninodular goiter 03/17/2016  .  Other and unspecified hyperlipidemia 03/17/2016  . Other premature beats 03/17/2016  . Pulmonary interstitial fibrosis ([place]) 03/17/2016  . Rectocele 03/17/2016  . Symptomatic bradycardia 03/17/2016  . Chest pain 03/17/2016  . Lower extremity edema 10/13/2015  . Localized acrodermatitis continua of [place] 06/29/2015  . Cervical pain (neck) 06/14/2015  . Dysphagia 12/14/2014  . IPF (idiopathic pulmonary fibrosis) ([place]) 07/06/2014  . Paronychia 06/16/2014  . Bone pain 08/25/2013  . Lumbar radicular pain 08/25/2013  . Allergic rhinitis 07/14/2013  . Post-operative state 03/25/2013  . Preop cardiovascular exam 12/24/2012  . Ejection fraction   . Cystocele 12/11/2012  . Insomnia 10/23/2012  . Vaginal prolapse 04/02/2012  . Urinary incontinence 02/28/2012  . Other benign neoplasm of connective and other soft tissue of lower limb, including hip 09/08/2011  . Osteoporosis 03/20/2011  . Closed fracture of sacrum ([place]) 12/23/2010  . HIV positive ([place])   . CAD (coronary artery disease)   . Palpitations   . Dyslipidemia   . Persistent asthma   . Peptic ulcer disease   . GERD (gastroesophageal reflux disease)   . Bradycardia   . Shortness of breath   . PULMONARY FIBROSIS 07/06/2008  . CONSTIPATION 07/22/2007  . WEIGHT LOSS-ABNORMAL 07/22/2007  . ABDOMINAL PAIN-GENERALIZED 07/22/2007    Past Surgical History:  Procedure Laterality Date  . APPENDECTOMY    . CARDIAC CATHETERIZATION N/A 03/21/2016   Procedure: Right/Left Heart Cath and Coronary Angiography;  Surgeon:  Belva Crome, MD;  Location: [facility];  Service: Cardiovascular;  Laterality: N/A;  . CHOLECYSTECTOMY    . GASTRECTOMY    . VENTRAL HERNIA REPAIR       OB History   No obstetric history on file.      Home Medications    Prior to Admission medications   Medication Sig Start Date End Date Taking? Authorizing Provider  albuterol (PROVENTIL HFA;VENTOLIN HFA) 108 (90 Base) MCG/ACT inhaler Inhale 2 puffs  into the lungs every 4 (four) hours as needed for wheezing or shortness of breath.  01/13/16  Yes [provider]  amitriptyline (ELAVIL) 50 MG tablet Take 50 mg by mouth at bedtime.     Yes [provider]  aspirin EC 81 MG tablet Take 1 tablet (81 mg total) by mouth daily. 03/17/16  Yes Dorothy Spark, MD  atorvastatin (LIPITOR) 40 MG tablet Take 40 mg by mouth daily at 6 PM.    Yes [provider]  budesonide-formoterol (SYMBICORT) 160-4.5 MCG/ACT inhaler Inhale 2 puffs into the lungs 2 (two) times daily.    Yes [provider]  clopidogrel (PLAVIX) 75 MG tablet Take 75 mg by mouth daily.   Yes [provider]  conjugated estrogens (PREMARIN) vaginal cream Place 2.87 Applicatorfuls vaginally every Wednesday. Pt only to use 1/4 of the applicator    Yes [provider]  docusate sodium (COLACE) 100 MG capsule Take 100 mg by mouth daily as needed for mild constipation.  05/05/15  Yes [provider]  Dolutegravir-lamiVUDine (DOVATO) 50-300 MG TABS Take 1 tablet by mouth daily.   Yes [provider]  metoprolol tartrate (LOPRESSOR) 25 MG tablet Take 12.5 mg by mouth 2 (two) times daily. 12/19/17  Yes [provider]  nitroGLYCERIN (NITROSTAT) 0.4 MG SL tablet Place 1 tablet (0.4 mg total) under the tongue every 5 (five) minutes as needed for chest pain. 03/21/16  Yes Belva Crome, MD  omeprazole (PRILOSEC) 40 MG capsule Take 40 mg by mouth 2 (two) times daily. 04/18/18  Yes [provider]  Pirfenidone (ESBRIET) 267 MG CAPS Take 534 mg by mouth 2 (two) times daily.    Yes [provider]  polyethylene glycol (MIRALAX / GLYCOLAX) packet Take 17 g by mouth daily as needed (constipation).    Yes [provider]  isosorbide mononitrate (IMDUR) 30 MG 24 hr tablet Take 1 tablet (30 mg total) by mouth daily. Patient not taking: Reported on 02/25/2018 03/21/16   Belva Crome, MD  ondansetron (ZOFRAN) 4 MG  tablet Take 1 tablet (4 mg total) by mouth every 8 (eight) hours as needed for nausea or vomiting. Patient not taking: Reported on 02/25/2018 04/28/17   Valarie Merino, MD    Family History Family History  Problem Relation Age of Onset  . Coronary artery disease Unknown        family hx of on mother, father and siblings  . Bradycardia Sister   . Diabetes Mother   . Heart attack Mother   . Heart disease Mother   . Hypertension Mother   . Stroke Mother   . Bradycardia Brother     Social History Social History   Tobacco Use  . Smoking status: Former Smoker    Last attempt to quit: 03/13/1996    Years since quitting: 22.1  . Smokeless tobacco: Never Used  Substance Use Topics  . Alcohol use: No  . Drug use: No     Allergies  Isosorbide nitrate and Sulfamethoxazole-trimethoprim   Review of Systems Review of Systems  Constitutional: Positive for activity change, appetite change, chills, fatigue and fever.  HENT: Negative for ear pain, sore throat, trouble swallowing and voice change.   Eyes: Negative for pain and visual disturbance.  Respiratory: Positive for cough and shortness of breath.   Cardiovascular: Negative for chest pain and palpitations.  Gastrointestinal: Positive for abdominal pain and nausea. Negative for constipation and vomiting.  Genitourinary: Negative for dysuria and hematuria.  Musculoskeletal: Positive for arthralgias and myalgias. Negative for back pain.  Skin: Negative for color change and rash.  Neurological: Negative for seizures and syncope.  All other systems reviewed and are negative.    Physical Exam Updated Vital Signs BP 110/61 (BP Location: Left Arm)   Pulse 91   Temp 99.5 F (37.5 C) (Oral)   Resp (!) 28   Ht 4\' 11"  (1.499 m)   Wt 67.6 kg   SpO2 96%   BMI 30.09 kg/m   Physical Exam Vitals signs and nursing note reviewed.  Constitutional:      General: She is not in acute distress.    Appearance: She is well-developed. She  is ill-appearing.  HENT:     Head: Normocephalic and atraumatic.     Mouth/Throat:     Mouth: Mucous membranes are dry.  Eyes:     Conjunctiva/sclera: Conjunctivae normal.  Neck:     Musculoskeletal: Neck supple.  Cardiovascular:     Rate and Rhythm: Regular rhythm. Tachycardia present.     Heart sounds: S1 normal and S2 normal. No murmur.  Pulmonary:     Effort: Pulmonary effort is normal. No respiratory distress.     Breath sounds: Rhonchi present.  Abdominal:     Palpations: Abdomen is soft.     Tenderness: There is abdominal tenderness in the suprapubic area. There is no guarding or rebound. Negative signs include Murphy's sign, Rovsing's sign and McBurney's sign.  Skin:    General: Skin is warm and dry.  Neurological:     General: No focal deficit present.     Mental Status: She is alert and oriented to person, place, and time.     GCS: GCS eye subscore is 4. GCS verbal subscore is 5. GCS motor subscore is 6.  Psychiatric:        Behavior: Behavior is cooperative.      ED Treatments / Results  Labs (all labs ordered are listed, but only abnormal results are displayed) Labs Reviewed  CBC WITH DIFFERENTIAL/PLATELET - Abnormal; Notable for the following components:      Result Value   Lymphs Abs 0.6 (*)    Monocytes Absolute 1.5 (*)    All other components within normal limits  COMPREHENSIVE METABOLIC PANEL - Abnormal; Notable for the following components:   Sodium 131 (*)    CO2 17 (*)    Glucose, Bld 134 (*)    Calcium 8.1 (*)    Albumin 3.4 (*)    All other components within normal limits  INFLUENZA PANEL BY PCR (TYPE A & B) - Abnormal; Notable for the following components:   Influenza A By PCR POSITIVE (*)    All other components within normal limits  CULTURE, BLOOD (ROUTINE X 2)  CULTURE, BLOOD (ROUTINE X 2)  URINE CULTURE  LIPASE, BLOOD  BRAIN NATRIURETIC PEPTIDE  LACTIC ACID, PLASMA  URINALYSIS, ROUTINE W REFLEX MICROSCOPIC  LACTIC ACID, PLASMA    LACTIC ACID, PLASMA  PROCALCITONIN  BASIC METABOLIC PANEL  CBC    EKG EKG Interpretation  Date/Time:  Sunday April 21 2018 19:18:44 EST Ventricular Rate:  109 PR Interval:    QRS Duration: 94 QT Interval:  323 QTC Calculation: 435 R Axis:   -92 Text Interpretation:  Sinus tachycardia LAD, consider left anterior fascicular block Right ventricular hypertrophy Confirmed by Lacretia Leigh ([id number]) on 04/21/2018 7:36:48 PM   Radiology Ct Abdomen Pelvis W Contrast  Result Date: 04/22/2018 CLINICAL DATA:  Generalized abdominal pain, fever EXAM: CT ABDOMEN AND PELVIS WITH CONTRAST TECHNIQUE: Multidetector CT imaging of the abdomen and pelvis was performed using the standard protocol following bolus administration of intravenous contrast. CONTRAST:  150mL OMNIPAQUE IOHEXOL 300 MG/ML  SOLN COMPARISON:  12/18/2016 FINDINGS: Lower chest: Esophagus is fluid-filled. Small to moderate-sized hiatal hernia. No confluent opacities or effusions. Hepatobiliary: No focal liver abnormality is seen. Status post cholecystectomy. No biliary dilatation. Pancreas: No focal abnormality or ductal dilatation. Spleen: No focal abnormality.  Normal size. Adrenals/Urinary Tract: No adrenal abnormality. No focal renal abnormality. No stones or hydronephrosis. Urinary bladder is unremarkable. Stomach/Bowel: Postoperative changes in the distal stomach. Moderate stool burden in the colon. No evidence of bowel obstruction. Vascular/Lymphatic: Aortic atherosclerosis. No enlarged abdominal or pelvic lymph nodes. Reproductive: Prior hysterectomy.  No adnexal masses. Other: No free fluid or free air. Musculoskeletal: No acute bony abnormality. IMPRESSION: Small to moderate-sized hiatal hernia. Fluid-filled distal esophagus may be related to reflux. Moderate stool burden in the colon. Aortic atherosclerosis. No acute findings. Electronically Signed   By: Rolm Baptise M.D.   On: 04/22/2018 00:15   Dg Chest Portable 1 View  Result  Date: 04/21/2018 CLINICAL DATA:  Fever, cough EXAM: PORTABLE CHEST 1 VIEW COMPARISON:  04/28/2017 FINDINGS: Heart and mediastinal contours are within normal limits. No focal opacities or effusions. No acute bony abnormality. IMPRESSION: No active disease. Electronically Signed   By: Rolm Baptise M.D.   On: 04/21/2018 19:51    Procedures Procedures (including critical care time)  Medications Ordered in ED Medications  lactated ringers bolus 1,000 mL (0 mLs Intravenous Stopped 04/21/18 2147)  metroNIDAZOLE (FLAGYL) IVPB 500 mg (0 mg Intravenous Stopped 04/21/18 2254)  ceFEPIme (MAXIPIME) 2 g in sodium chloride 0.9 % 100 mL IVPB (has no administration in time range)  vancomycin (VANCOCIN) 500 mg in sodium chloride 0.9 % 100 mL IVPB (has no administration in time range)  vancomycin (VANCOCIN) 1,250 mg in sodium chloride 0.9 % 250 mL IVPB (has no administration in time range)  0.9 %  sodium chloride infusion (has no administration in time range)  ipratropium (ATROVENT) nebulizer solution 0.5 mg (has no administration in time range)  levalbuterol (XOPENEX) nebulizer solution 1.25 mg (has no administration in time range)  dextromethorphan-guaiFENesin (MUCINEX DM) 30-600 MG per 12 hr tablet 1 tablet (has no administration in time range)  methylPREDNISolone sodium succinate (SOLU-MEDROL) 125 mg/2 mL injection 60 mg (has no administration in time range)  enoxaparin (LOVENOX) injection 40 mg (has no administration in time range)  acetaminophen (TYLENOL) tablet 650 mg (has no administration in time range)    Or  acetaminophen (TYLENOL) suppository 650 mg (has no administration in time range)  senna-docusate (Senokot-S) tablet 1 tablet (has no administration in time range)  ondansetron (ZOFRAN) tablet 4 mg (has no administration in time range)    Or  ondansetron (ZOFRAN) injection 4 mg (has no administration in time range)  hydrALAZINE (APRESOLINE) injection 5 mg (has no administration in time range)    aspirin EC tablet 81 mg (has  no administration in time range)  Dolutegravir-lamiVUDine 50-300 MG TABS 1 tablet (has no administration in time range)  atorvastatin (LIPITOR) tablet 40 mg (has no administration in time range)  nitroGLYCERIN (NITROSTAT) SL tablet 0.4 mg (has no administration in time range)  amitriptyline (ELAVIL) tablet 50 mg (has no administration in time range)  docusate sodium (COLACE) capsule 100 mg (has no administration in time range)  pantoprazole (PROTONIX) EC tablet 40 mg (has no administration in time range)  clopidogrel (PLAVIX) tablet 75 mg (has no administration in time range)  mometasone-formoterol (DULERA) 200-5 MCG/ACT inhaler 2 puff (has no administration in time range)  Pirfenidone CAPS 534 mg (has no administration in time range)  oseltamivir (TAMIFLU) capsule 75 mg (has no administration in time range)  morphine 2 MG/ML injection 1 mg (has no administration in time range)  lactated ringers bolus 1,000 mL (0 mLs Intravenous Stopped 04/21/18 2115)  ceFEPIme (MAXIPIME) 2 g in sodium chloride 0.9 % 100 mL IVPB (0 g Intravenous Stopped 04/21/18 2146)  vancomycin (VANCOCIN) IVPB 1000 mg/200 mL premix (1,000 mg Intravenous New Bag/Given 04/21/18 2300)  iohexol (OMNIPAQUE) 300 MG/ML solution 100 mL (100 mLs Intravenous Contrast Given 04/22/18 0002)     Initial Impression / Assessment and Plan / ED Course  I have reviewed the triage vital signs and the nursing notes.  Pertinent labs & imaging results that were available during my care of the patient were reviewed by me and considered in my medical decision making (see chart for details).     MDM:  Imaging: Chest x-ray shows no active cardiac or pulmonary abnormality.  ED Provider Interpretation of EKG: Sinus tachycardia with a rate of 109 bpm, left axis deviation, no ST segment elevation or depression, pathologic T wave abnormalities, or significant interval regularity.  Right bundle branch block.  Labs: Lactate  1.3, flu a positive, CBC normal, CMP with CO2 17 otherwise unremarkable, lipase 25, BNP 27.9, blood and urine culture in process.  UA pending.  On initial evaluation, patient appears ill. Afebrile (oral) and hemodynamically stable although tachycardic in the low 100s. Alert and oriented x4, pleasant, and cooperative.  Patient presents with arthralgias myalgias with cough and dyspnea nausea as detailed above.  On exam, patient is slightly volume down with dry mucous membranes otherwise has no acute pathology.  Lungs are largely clear although with subtle rhonchi bilaterally.  Equal bilaterally.  Chest x-ray shows no evidence for pneumonia or pneumothorax.  EKG without evidence for acute ischemia or arrhythmia.  Has a history of CAD but denies chest pressure and chest pain.  Low suspicion for ACS at this time and do not feel the troponin is indicated.  No evidence of pericarditis or myocarditis at this time.  Afebrile in the ED but reported temperature of 103 to 104 degrees at home.  Given her tachypnea and tachycardia she meets criteria for Sirs therefore code sepsis initiated.  Cultures collected and 30 cc/kg IV LR bolus given as she initially had systolic pressureOf 42/70 which had normalized to the 110s over 70s prior to initiation of fluids.  Chart review shows CD4 count was 290 on January 7.  Vancomycin, cefepime, metronidazole per hospital protocol for sepsis of unknown etiology awaiting additional work-up  Influenza A positive.  Urine pending.  Started on Tamiflu.  Hemodynamically improved and admitted to hospitalist service thereafter at time of transfer of care to oncoming team.  The plan for this patient was discussed with Dr. Zenia Resides who voiced agreement and who oversaw evaluation  and treatment of this patient.   The patient was fully informed and involved with the history taking, evaluation, workup including labs/images, and plan. The patient's concerns and questions were addressed to the  patient's satisfaction and she expressed agreement with the plan to admit.    Final Clinical Impressions(s) / ED Diagnoses   Final diagnoses:  Influenza A  Sepsis, due to unspecified organism, unspecified whether acute organ dysfunction present [facility])    ED Discharge Orders    None       Nalayah Hitt, Rodena Goldmann, MD 04/22/18 Quentin Mulling    Lacretia Leigh, MD 04/22/18 1330

## 2018-04-21 NOTE — ED Provider Notes (Addendum)
I saw and evaluated the patient, reviewed the resident's note and I agree with the findings and plan.  EKG: EKG Interpretation  Date/Time:  Sunday April 21 2018 19:18:44 EST Ventricular Rate:  109 PR Interval:    QRS Duration: 94 QT Interval:  323 QTC Calculation: 435 R Axis:   -92 Text Interpretation:  Sinus tachycardia LAD, consider left anterior fascicular block Right ventricular hypertrophy Confirmed by Lacretia Leigh ([id number]) on 04/21/2018 7:27:33 PM   68 year old female presents with 2 days of cough congestion and fever up to 103 at home.  She is HIV positive.  Chest x-ray without active disease.  Was initially hypotensive here which did improve.  Patient meet sepsis criteria will be given IV fluids as well as antibiotics admitted to the hospital.  CRITICAL CARE Performed by: Leota Jacobsen Total critical care time: 50 minutes Critical care time was exclusive of separately billable procedures and treating other patients. Critical care was necessary to treat or prevent imminent or life-threatening deterioration. Critical care was time spent personally by me on the following activities: development of treatment plan with patient and/or surrogate as well as nursing, discussions with consultants, evaluation of patient's response to treatment, examination of patient, obtaining history from patient or surrogate, ordering and performing treatments and interventions, ordering and review of laboratory studies, ordering and review of radiographic studies, pulse oximetry and re-evaluation of patient's condition.    Lacretia Leigh, MD 04/21/18 Patrecia Pour    Lacretia Leigh, MD 05/06/18 [phone]

## 2018-04-21 NOTE — H&P (Addendum)
History and Physical    [name] [name] LKG:[id number] DOB: 1950/06/22 DOA: 04/21/2018  Referring MD/NP/PA:   PCP: System, Pcp Not In   Patient coming from:  The patient is coming from home.  At baseline, pt is independent for most of ADL.        Chief Complaint: Fever, chills, abdominal pain  HPI: [name] [name] is a 68 y.o. female with medical history significant of HIV (CD4 290, 03/2018), hypertension, hyperlipidemia, asthma, pulmonary fibrosis, GERD, depression, CAD, PUD, who presents with fever, chills, abdominal pain.  Patient states that she has been sick for more than 3 days, described as fever, chills, dry cough.  No runny nose or sore throat.  She has mild shortness of breath, but no chest pain.  She reports nausea and abdominal pain, no vomiting or diarrhea.  Her abdominal pain is located in the left lower quadrant and right upper quadrant, which started today, is constant, 6 out of 10 severity, sharp, nonradiating.  Denies symptoms of UTI or unilateral weakness.  No neck rigidity or confusion.  Patient was initially hypotensive with blood pressure 87/64, which improved to 104/60 after giving 2 liter of Ringer's solution in ED.  ED Course: pt was found to have WBC 8.3, BNP 27.9, lactic acid 1.3, lipase 25, pending urinalysis, creatinine normal, temperature 99.1, tachycardia, tachypnea, oxygen saturation 93 to 95% on room air.  Chest x-ray negative.  Patient is placed on telemetry bed for observation.  Review of Systems:   General: has fevers, chills, no body weight gain, has poor appetite, has fatigue HEENT: no blurry vision, hearing changes or sore throat Respiratory: has dyspnea, coughing, wheezing CV: no chest pain, no palpitations GI: has nausea, abdominal pain, no diarrhea, constipation, vomiting,  GU: no dysuria, burning on urination, increased urinary frequency, hematuria  Ext: no leg edema Neuro: no unilateral weakness, numbness, or tingling, no vision change or hearing  loss Skin: no rash, no skin tear. MSK: No muscle spasm, no deformity, no limitation of range of movement in spin Heme: No easy bruising.  Travel history: No recent long distant travel.  Allergy:  Allergies  Allergen Reactions  . Isosorbide Nitrate Other (See Comments)    Headaches  . Sulfamethoxazole-Trimethoprim Hives and Rash    Past Medical History:  Diagnosis Date  . Asthma   . Bradycardia    Sinus bradycardia  . CAD (coronary artery disease)    Minimal by history, Question coronary artery spasm  . Colon polyps   . Dyslipidemia   . Ejection fraction   . GERD (gastroesophageal reflux disease)    Esophageal stricture  . H/O: hysterectomy   . HIV positive ([place])    Needle stick 1993  . Hyperlipidemia   . Palpitations   . Peptic ulcer disease   . Shortness of breath    Extensive evaluation Dr. Winona Legato, [facility], May, 6644, etiology uncertain, probable bronchiolitis possibility of lung biopsy at that time  . Weight loss     Past Surgical History:  Procedure Laterality Date  . APPENDECTOMY    . CARDIAC CATHETERIZATION N/A 03/21/2016   Procedure: Right/Left Heart Cath and Coronary Angiography;  Surgeon: Belva Crome, MD;  Location: [place] [facility];  Service: Cardiovascular;  Laterality: N/A;  . CHOLECYSTECTOMY    . GASTRECTOMY    . VENTRAL HERNIA REPAIR      Social History:  reports that she quit smoking about 22 years ago. She has never used smokeless tobacco. She reports that she does  not drink alcohol or use drugs.  Family History:  Family History  Problem Relation Age of Onset  . Coronary artery disease Unknown        family hx of on mother, father and siblings  . Bradycardia Sister   . Diabetes Mother   . Heart attack Mother   . Heart disease Mother   . Hypertension Mother   . Stroke Mother   . Bradycardia Brother      Prior to Admission medications   Medication Sig Start Date End Date Taking? Authorizing Provider  albuterol (PROVENTIL HFA;VENTOLIN  HFA) 108 (90 Base) MCG/ACT inhaler Inhale 2 puffs into the lungs every 4 (four) hours as needed for wheezing or shortness of breath.  01/13/16  Yes [provider]  amitriptyline (ELAVIL) 50 MG tablet Take 50 mg by mouth at bedtime.     Yes [provider]  aspirin EC 81 MG tablet Take 1 tablet (81 mg total) by mouth daily. 03/17/16  Yes Dorothy Spark, MD  atorvastatin (LIPITOR) 40 MG tablet Take 40 mg by mouth daily at 6 PM.    Yes [provider]  budesonide-formoterol (SYMBICORT) 160-4.5 MCG/ACT inhaler Inhale 2 puffs into the lungs 2 (two) times daily.    Yes [provider]  clopidogrel (PLAVIX) 75 MG tablet Take 75 mg by mouth daily.   Yes [provider]  conjugated estrogens (PREMARIN) vaginal cream Place 0.10 Applicatorfuls vaginally every Wednesday. Pt only to use 1/4 of the applicator    Yes [provider]  docusate sodium (COLACE) 100 MG capsule Take 100 mg by mouth daily as needed for mild constipation.  05/05/15  Yes [provider]  Dolutegravir-lamiVUDine (DOVATO) 50-300 MG TABS Take 1 tablet by mouth daily.   Yes [provider]  metoprolol tartrate (LOPRESSOR) 25 MG tablet Take 12.5 mg by mouth 2 (two) times daily. 12/19/17  Yes [provider]  nitroGLYCERIN (NITROSTAT) 0.4 MG SL tablet Place 1 tablet (0.4 mg total) under the tongue every 5 (five) minutes as needed for chest pain. 03/21/16  Yes Belva Crome, MD  omeprazole (PRILOSEC) 40 MG capsule Take 40 mg by mouth 2 (two) times daily. 04/18/18  Yes [provider]  Pirfenidone (ESBRIET) 267 MG CAPS Take 534 mg by mouth 2 (two) times daily.    Yes [provider]  polyethylene glycol (MIRALAX / GLYCOLAX) packet Take 17 g by mouth daily as needed (constipation).    Yes [provider]  isosorbide mononitrate (IMDUR) 30 MG 24 hr tablet Take 1 tablet (30 mg total) by mouth daily. Patient not taking: Reported on 02/25/2018 03/21/16    Belva Crome, MD  ondansetron (ZOFRAN) 4 MG tablet Take 1 tablet (4 mg total) by mouth every 8 (eight) hours as needed for nausea or vomiting. Patient not taking: Reported on 02/25/2018 04/28/17   Valarie Merino, MD    Physical Exam: Vitals:   04/22/18 0100 04/22/18 0131 04/22/18 0200 04/22/18 0300  BP: (!) 82/73 (!) 82/73 91/74 110/68  Pulse: 85 99 (!) 102 95  Resp: (!) 21 (!) 24 (!) 23 (!) 23  Temp:      TempSrc:      SpO2: 99% 94% 95% 94%  Weight:      Height:       General: Not in acute distress HEENT:       Eyes: PERRL, EOMI, no scleral icterus.       ENT: No discharge from the ears and  nose, no pharynx injection, no tonsillar enlargement.        Neck: No JVD, no bruit, no mass felt. Heme: No neck lymph node enlargement. Cardiac: S1/S2, RRR, No murmurs, No gallops or rubs. Respiratory: Has decreased air movement bilaterally, has mild wheezing bilaterally. GI: Soft, nondistended, has tenderness in LLQ and RUQ, no rebound pain, no organomegaly, BS present. GU: No hematuria Ext: No pitting leg edema bilaterally. 2+DP/PT pulse bilaterally. Musculoskeletal: No joint deformities, No joint redness or warmth, no limitation of ROM in spin. Skin: No rashes.  Neuro: Alert, oriented X3, cranial nerves II-XII grossly intact, moves all extremities normally.  Psych: Patient is not psychotic, no suicidal or hemocidal ideation.  Labs on Admission: I have personally reviewed following labs and imaging studies  CBC: Recent Labs  Lab 04/21/18 1942 04/22/18 0242  WBC 8.3 6.0  NEUTROABS 6.1  --   HGB 13.4 12.2  HCT 42.8 38.3  MCV 100.0 100.8*  PLT 236 836   Basic Metabolic Panel: Recent Labs  Lab 04/21/18 1942 04/22/18 0242  NA 131* 137  K 3.7 3.3*  CL 103 111  CO2 17* 15*  GLUCOSE 134* 140*  BUN 9 9  CREATININE 0.98 0.74  CALCIUM 8.1* 7.5*   GFR: Estimated Creatinine Clearance: 57.9 mL/min (by C-G formula based on SCr of 0.74 mg/dL). Liver Function Tests: Recent  Labs  Lab 04/21/18 1942  AST 22  ALT 17  ALKPHOS 102  BILITOT 1.0  PROT 6.9  ALBUMIN 3.4*   Recent Labs  Lab 04/21/18 1942  LIPASE 25   No results for input(s): AMMONIA in the last 168 hours. Coagulation Profile: No results for input(s): INR, PROTIME in the last 168 hours. Cardiac Enzymes: No results for input(s): CKTOTAL, CKMB, CKMBINDEX, TROPONINI in the last 168 hours. BNP (last 3 results) No results for input(s): PROBNP in the last 8760 hours. HbA1C: No results for input(s): HGBA1C in the last 72 hours. CBG: No results for input(s): GLUCAP in the last 168 hours. Lipid Profile: No results for input(s): CHOL, HDL, LDLCALC, TRIG, CHOLHDL, LDLDIRECT in the last 72 hours. Thyroid Function Tests: No results for input(s): TSH, T4TOTAL, FREET4, T3FREE, THYROIDAB in the last 72 hours. Anemia Panel: No results for input(s): VITAMINB12, FOLATE, FERRITIN, TIBC, IRON, RETICCTPCT in the last 72 hours. Urine analysis:    Component Value Date/Time   COLORURINE YELLOW 04/22/2018 0029   APPEARANCEUR CLEAR 04/22/2018 0029   LABSPEC 1.016 04/22/2018 0029   PHURINE 6.0 04/22/2018 0029   GLUCOSEU NEGATIVE 04/22/2018 0029   HGBUR SMALL (A) 04/22/2018 0029   BILIRUBINUR NEGATIVE 04/22/2018 0029   KETONESUR NEGATIVE 04/22/2018 0029   PROTEINUR NEGATIVE 04/22/2018 0029   UROBILINOGEN 4.0 (H) 07/29/2009 0740   NITRITE NEGATIVE 04/22/2018 0029   LEUKOCYTESUR NEGATIVE 04/22/2018 0029   Sepsis Labs: @LABRCNTIP (procalcitonin:4,lacticidven:4) )No results found for this or any previous visit (from the past 240 hour(s)).   Radiological Exams on Admission: Ct Abdomen Pelvis W Contrast  Result Date: 04/22/2018 CLINICAL DATA:  Generalized abdominal pain, fever EXAM: CT ABDOMEN AND PELVIS WITH CONTRAST TECHNIQUE: Multidetector CT imaging of the abdomen and pelvis was performed using the standard protocol following bolus administration of intravenous contrast. CONTRAST:  128mL OMNIPAQUE IOHEXOL  300 MG/ML  SOLN COMPARISON:  12/18/2016 FINDINGS: Lower chest: Esophagus is fluid-filled. Small to moderate-sized hiatal hernia. No confluent opacities or effusions. Hepatobiliary: No focal liver abnormality is seen. Status post cholecystectomy. No biliary dilatation. Pancreas: No focal abnormality or ductal dilatation. Spleen: No focal abnormality.  Normal size. Adrenals/Urinary Tract: No adrenal abnormality. No focal renal abnormality. No stones or hydronephrosis. Urinary bladder is unremarkable. Stomach/Bowel: Postoperative changes in the distal stomach. Moderate stool burden in the colon. No evidence of bowel obstruction. Vascular/Lymphatic: Aortic atherosclerosis. No enlarged abdominal or pelvic lymph nodes. Reproductive: Prior hysterectomy.  No adnexal masses. Other: No free fluid or free air. Musculoskeletal: No acute bony abnormality. IMPRESSION: Small to moderate-sized hiatal hernia. Fluid-filled distal esophagus may be related to reflux. Moderate stool burden in the colon. Aortic atherosclerosis. No acute findings. Electronically Signed   By: Rolm Baptise M.D.   On: 04/22/2018 00:15   Dg Chest Portable 1 View  Result Date: 04/21/2018 CLINICAL DATA:  Fever, cough EXAM: PORTABLE CHEST 1 VIEW COMPARISON:  04/28/2017 FINDINGS: Heart and mediastinal contours are within normal limits. No focal opacities or effusions. No acute bony abnormality. IMPRESSION: No active disease. Electronically Signed   By: Rolm Baptise M.D.   On: 04/21/2018 19:51     EKG: Independently reviewed.  Sinus rhythm, tachycardia, QTC 435, LAD, LAE, nonspecific T wave change.  Assessment/Plan Principal Problem:   Sepsis ([place]) Active Problems:   CAD (coronary artery disease)   Dyslipidemia   GERD (gastroesophageal reflux disease)   IPF (idiopathic pulmonary fibrosis) (HCC)   HIV (human immunodeficiency virus infection) ([place])   Abdominal pain   Asthma exacerbation   Influenza A   Sepsis [facility]): Patient meets criteria for  sepsis with a subjective fever, tachycardia, tachypnea, hypotension.  Source of infection is not clear.  Patient seems to have a mild asthma exacerbation, but this may not be the only source for infection.  Chest x-ray is negative for infiltration.  Urinalysis pending.  Patient complains of abdominal pain left lower quadrant and right upper quadrant, liver function normal.  will follow-up CT scan of abdomen/pelvis.  No meningeal sign, less likely to have meningitis.  -will place on tele bed for obs -Antibiotics: Broad antibiotics were started in the ED, including vancomycin, cefepime and Flagyl, will continue at this moment. -Follow-up blood culture, urine culture -will get Procalcitonin and trend lactic acid levels per sepsis protocol. -IVF: 2L of NS bolus in ED, followed by 125 cc/h   Addendum: Flu PCR is positive for flu A, urinalysis negative, CT abdomen/pelvis is not impressive.  Patient blood pressure dropped to 87/64 -Will DC antibiotics (vancomycin, cefepime and Flagyl) -Started Tamiflu -Give another 1 L normal saline bolus and increase fluid rate to 150 cc/h   CAD (coronary artery disease): No CP -Continue aspirin, Plavix, Lipitor -PRN nitroglycerin  Dyslipidemia: -Lipitor  HTN: -hold metoprolol due to hypotension -IV hydralazine as needed  GERD: -Protonix  HIV (human immunodeficiency virus infection: recent CD4=290. -continue home HIV medication  Asthma exacerbation and IPF (idiopathic pulmonary fibrosis): Patient has dry cough, mild wheezing and mild shortness of breath, indicating mild asthma exacerbation. - Albuterol nebulizers, Dulera inhalers, PRN Xopenex nebulizers - Solu-Medrol 60 mg twice daily - PRN Mucinex for cough -Follow-up for flu PCR - Continue home pirfenidone  Abdominal pain: Patient has nausea and abdominal pain in left lower and right upper quadrant.  Lipase normal.  LFT normal. Since the source of sepsis has not been identified yet, will get CT scan  of abdomen/pelvis. -PRN Zofran for nausea\ -prn morphine for abdominal pain -Follow-up CT abdomen/pelvis      DVT ppx: SQ Lovenox Code Status: Full code Family Communication: None at bed side.     Disposition Plan:  Anticipate discharge back to previous home environment Consults called:  none Admission status: Obs / tele      Date of Service 04/22/2018    Ivor Costa [facility]   If 7PM-7AM, please contact night-coverage [email or web address] Password [email or web address] 04/22/2018, 4:02 AM

## 2018-04-21 NOTE — Progress Notes (Signed)
Pharmacy Antibiotic Note  [name] [name] is a 68 y.o. female admitted on 04/21/2018 with sepsis.  Pharmacy has been consulted for Cefepime and vancomycin dosing.  Metronidazole per MD. Tachy, SOB, WBC wnl, AF, LA 1.3.  Plan: Vancomycin 1500mg  IV x 1, then 1250mg  IV every 36 hours (predicted AUC 470, SCr 0.98) Cefepime 2g IV every 24 hours Monitor renal function, Cx and clinical progression to narrow Vancomycin levels at SS    Temp (24hrs), Avg:99.1 F (37.3 C), Min:99.1 F (37.3 C), Max:99.1 F (37.3 C)  Recent Labs  Lab 04/21/18 1942 04/21/18 2001  WBC 8.3  --   CREATININE 0.98  --   LATICACIDVEN  --  1.3    CrCl cannot be calculated (Unknown ideal weight.).    Allergies  Allergen Reactions  . Isosorbide Nitrate Other (See Comments)    Headaches  . Sulfamethoxazole-Trimethoprim Hives and Rash    Antimicrobials this admission: Vanc 2/9>> Cefepime 2/9>> Flagyl 2/9>>  Dose adjustments this admission: n/a  Microbiology results: 2/9 BCx: sent 2/9 UCx: sent  Bertis Ruddy, PharmD Clinical Pharmacist Please check AMION for all [place] numbers 04/21/2018 9:09 PM

## 2018-04-21 NOTE — ED Notes (Signed)
EKG done

## 2018-04-21 NOTE — ED Triage Notes (Addendum)
C/o fever and non-productive cough since Thursday.  Pt hypotensive.

## 2018-04-22 ENCOUNTER — Other Ambulatory Visit: Payer: Self-pay

## 2018-04-22 ENCOUNTER — Ambulatory Visit (HOSPITAL_COMMUNITY): Payer: Self-pay

## 2018-04-22 DIAGNOSIS — Z8249 Family history of ischemic heart disease and other diseases of the circulatory system: Secondary | ICD-10-CM | POA: Diagnosis not present

## 2018-04-22 DIAGNOSIS — Z21 Asymptomatic human immunodeficiency virus [HIV] infection status: Secondary | ICD-10-CM | POA: Diagnosis present

## 2018-04-22 DIAGNOSIS — J09X2 Influenza due to identified novel influenza A virus with other respiratory manifestations: Secondary | ICD-10-CM

## 2018-04-22 DIAGNOSIS — Z823 Family history of stroke: Secondary | ICD-10-CM | POA: Diagnosis not present

## 2018-04-22 DIAGNOSIS — I2510 Atherosclerotic heart disease of native coronary artery without angina pectoris: Secondary | ICD-10-CM | POA: Diagnosis present

## 2018-04-22 DIAGNOSIS — I700 Atherosclerosis of aorta: Secondary | ICD-10-CM | POA: Diagnosis present

## 2018-04-22 DIAGNOSIS — R0602 Shortness of breath: Secondary | ICD-10-CM | POA: Diagnosis not present

## 2018-04-22 DIAGNOSIS — J45901 Unspecified asthma with (acute) exacerbation: Secondary | ICD-10-CM | POA: Diagnosis present

## 2018-04-22 DIAGNOSIS — R402362 Coma scale, best motor response, obeys commands, at arrival to emergency department: Secondary | ICD-10-CM | POA: Diagnosis present

## 2018-04-22 DIAGNOSIS — E876 Hypokalemia: Secondary | ICD-10-CM | POA: Diagnosis present

## 2018-04-22 DIAGNOSIS — K5900 Constipation, unspecified: Secondary | ICD-10-CM | POA: Diagnosis present

## 2018-04-22 DIAGNOSIS — I5033 Acute on chronic diastolic (congestive) heart failure: Secondary | ICD-10-CM | POA: Diagnosis present

## 2018-04-22 DIAGNOSIS — B20 Human immunodeficiency virus [HIV] disease: Secondary | ICD-10-CM | POA: Diagnosis not present

## 2018-04-22 DIAGNOSIS — J9601 Acute respiratory failure with hypoxia: Secondary | ICD-10-CM | POA: Diagnosis present

## 2018-04-22 DIAGNOSIS — J84112 Idiopathic pulmonary fibrosis: Secondary | ICD-10-CM | POA: Diagnosis present

## 2018-04-22 DIAGNOSIS — K219 Gastro-esophageal reflux disease without esophagitis: Secondary | ICD-10-CM | POA: Diagnosis present

## 2018-04-22 DIAGNOSIS — R509 Fever, unspecified: Secondary | ICD-10-CM | POA: Diagnosis present

## 2018-04-22 DIAGNOSIS — Z9049 Acquired absence of other specified parts of digestive tract: Secondary | ICD-10-CM | POA: Diagnosis not present

## 2018-04-22 DIAGNOSIS — M810 Age-related osteoporosis without current pathological fracture: Secondary | ICD-10-CM | POA: Diagnosis present

## 2018-04-22 DIAGNOSIS — R402142 Coma scale, eyes open, spontaneous, at arrival to emergency department: Secondary | ICD-10-CM | POA: Diagnosis present

## 2018-04-22 DIAGNOSIS — Z8619 Personal history of other infectious and parasitic diseases: Secondary | ICD-10-CM | POA: Diagnosis not present

## 2018-04-22 DIAGNOSIS — I110 Hypertensive heart disease with heart failure: Secondary | ICD-10-CM | POA: Diagnosis present

## 2018-04-22 DIAGNOSIS — Z87891 Personal history of nicotine dependence: Secondary | ICD-10-CM | POA: Diagnosis not present

## 2018-04-22 DIAGNOSIS — K449 Diaphragmatic hernia without obstruction or gangrene: Secondary | ICD-10-CM | POA: Diagnosis present

## 2018-04-22 DIAGNOSIS — E785 Hyperlipidemia, unspecified: Secondary | ICD-10-CM | POA: Diagnosis present

## 2018-04-22 DIAGNOSIS — I25118 Atherosclerotic heart disease of native coronary artery with other forms of angina pectoris: Secondary | ICD-10-CM | POA: Diagnosis not present

## 2018-04-22 DIAGNOSIS — I4510 Unspecified right bundle-branch block: Secondary | ICD-10-CM | POA: Diagnosis present

## 2018-04-22 DIAGNOSIS — R402252 Coma scale, best verbal response, oriented, at arrival to emergency department: Secondary | ICD-10-CM | POA: Diagnosis present

## 2018-04-22 DIAGNOSIS — J101 Influenza due to other identified influenza virus with other respiratory manifestations: Secondary | ICD-10-CM | POA: Diagnosis present

## 2018-04-22 HISTORY — DX: Influenza due to identified novel influenza A virus with other respiratory manifestations: J09.X2

## 2018-04-22 LAB — URINALYSIS, ROUTINE W REFLEX MICROSCOPIC
Bilirubin Urine: NEGATIVE
Glucose, UA: NEGATIVE mg/dL
Ketones, ur: NEGATIVE mg/dL
Leukocytes, UA: NEGATIVE
Nitrite: NEGATIVE
Protein, ur: NEGATIVE mg/dL
Specific Gravity, Urine: 1.016 (ref 1.005–1.030)
pH: 6 (ref 5.0–8.0)

## 2018-04-22 LAB — CBC
HCT: 38.3 % (ref 36.0–46.0)
Hemoglobin: 12.2 g/dL (ref 12.0–15.0)
MCH: 32.1 pg (ref 26.0–34.0)
MCHC: 31.9 g/dL (ref 30.0–36.0)
MCV: 100.8 fL — ABNORMAL HIGH (ref 80.0–100.0)
PLATELETS: 205 10*3/uL (ref 150–400)
RBC: 3.8 MIL/uL — ABNORMAL LOW (ref 3.87–5.11)
RDW: 13.9 % (ref 11.5–15.5)
WBC: 6 10*3/uL (ref 4.0–10.5)
nRBC: 0 % (ref 0.0–0.2)

## 2018-04-22 LAB — BASIC METABOLIC PANEL
Anion gap: 11 (ref 5–15)
BUN: 9 mg/dL (ref 8–23)
CALCIUM: 7.5 mg/dL — AB (ref 8.9–10.3)
CO2: 15 mmol/L — ABNORMAL LOW (ref 22–32)
Chloride: 111 mmol/L (ref 98–111)
Creatinine, Ser: 0.74 mg/dL (ref 0.44–1.00)
GFR calc Af Amer: >60 mL/min (ref >60)
GFR calc non Af Amer: >60 mL/min (ref >60)
Glucose, Bld: 140 mg/dL — ABNORMAL HIGH (ref 70–99)
Potassium: 3.3 mmol/L — ABNORMAL LOW (ref 3.5–5.1)
Sodium: 137 mmol/L (ref 135–145)

## 2018-04-22 LAB — PROCALCITONIN: Procalcitonin: <0.1 ng/mL

## 2018-04-22 LAB — MAGNESIUM: Magnesium: 2.1 mg/dL (ref 1.7–2.4)

## 2018-04-22 MED ORDER — IOHEXOL 300 MG/ML  SOLN
100.0000 mL | Freq: Once | INTRAMUSCULAR | Status: AC | PRN
  Administered 2018-04-22: 100 mL via INTRAVENOUS

## 2018-04-22 MED ORDER — DOLUTEGRAVIR SODIUM 50 MG PO TABS
50.0000 mg | ORAL_TABLET | Freq: Every day | ORAL | Status: DC
  Administered 2018-04-22 – 2018-04-24 (×3): 50 mg via ORAL
  Filled 2018-04-22 (×3): qty 1

## 2018-04-22 MED ORDER — SODIUM CHLORIDE 0.9 % IV BOLUS
1000.0000 mL | Freq: Once | INTRAVENOUS | Status: AC
  Administered 2018-04-22: 1000 mL via INTRAVENOUS

## 2018-04-22 MED ORDER — SODIUM CHLORIDE 0.9 % IV SOLN
INTRAVENOUS | Status: DC
  Administered 2018-04-22 (×2): via INTRAVENOUS

## 2018-04-22 MED ORDER — POTASSIUM CHLORIDE CRYS ER 20 MEQ PO TBCR
40.0000 meq | EXTENDED_RELEASE_TABLET | Freq: Once | ORAL | Status: AC
  Administered 2018-04-22: 40 meq via ORAL
  Filled 2018-04-22: qty 2

## 2018-04-22 MED ORDER — IPRATROPIUM BROMIDE 0.02 % IN SOLN
0.5000 mg | Freq: Four times a day (QID) | RESPIRATORY_TRACT | Status: DC
  Administered 2018-04-22 – 2018-04-23 (×3): 0.5 mg via RESPIRATORY_TRACT
  Filled 2018-04-22 (×3): qty 2.5

## 2018-04-22 MED ORDER — MORPHINE SULFATE (PF) 2 MG/ML IV SOLN
1.0000 mg | INTRAVENOUS | Status: DC | PRN
  Administered 2018-04-23: 1 mg via INTRAVENOUS
  Filled 2018-04-22: qty 1

## 2018-04-22 MED ORDER — PIRFENIDONE 267 MG PO CAPS
534.0000 mg | ORAL_CAPSULE | Freq: Two times a day (BID) | ORAL | Status: DC
  Administered 2018-04-22 – 2018-04-24 (×5): 534 mg via ORAL
  Filled 2018-04-22 (×4): qty 2

## 2018-04-22 MED ORDER — LAMIVUDINE 150 MG PO TABS
300.0000 mg | ORAL_TABLET | Freq: Every day | ORAL | Status: DC
  Administered 2018-04-22 – 2018-04-24 (×3): 300 mg via ORAL
  Filled 2018-04-22 (×3): qty 2

## 2018-04-22 MED ORDER — OSELTAMIVIR PHOSPHATE 75 MG PO CAPS
75.0000 mg | ORAL_CAPSULE | Freq: Two times a day (BID) | ORAL | Status: DC
  Administered 2018-04-22 – 2018-04-24 (×6): 75 mg via ORAL
  Filled 2018-04-22 (×7): qty 1

## 2018-04-22 NOTE — Progress Notes (Signed)
[facility] PROGRESS NOTE  [name] [name] JSE:[id number] DOB: 1951/01/06 DOA: 04/21/2018 PCP: System, Pcp Not In  Assessment/Plan:  Influenza A with respiratory manifestations: not much better this am. Improved work of breathing and not hypoxic but continues with frequent cough/wheeze. Xray negative for infiltration.  normal lactic acid. Hemodynamically stable. -continue tamiflu day #2 -follow blood cultures -continue scheduled nebs -continue solumedrol -anti-tussive -monitor oxygen saturation level -oxygen supplementation as needed cefepime and Flagyl, will continue at this moment. -continue IV fluids   CAD (coronary artery disease): No CP  -Continue aspirin, Plavix, Lipitor -hold imdur -PRN nitroglycerin  Dyslipidemia: -Lipitor  HTN: controlled. Home meds include metoprolol, imdur -hold metoprolol due to hypotension -IV hydralazine as needed  GERD: -Protonix  HIV (human immunodeficiency virus infection: recent CD4=290. -continue home HIV medication  Asthma exacerbation and IPF (idiopathic pulmonary fibrosis): Continues with  wheezing and mild shortness of breath, indicating mild asthma exacerbation. - Albuterol nebulizers, Dulera inhalers, PRN Xopenex nebulizers - Solu-Medrol 60 mg twice daily - PRN Mucinex for cough - Continue home pirfenidone  Abdominal pain: improved this am.   Lipase normal.  LFT normal. CT scan of abdomen/pelvis reveals small to moderated hiatal hernia, fluid filled distal esophagus may be related to reflux, moderate stool burden. No acute findings -PRN Zofran for nausea\ -prn morphine for abdominal pain  Hypokalemia.  -replete -recheck    Code Status: full Family Communication: non present Disposition Plan: home hopefully tomorrow   Consultants:    Procedures:    Antibiotics:  Vancomycin 04/21/18  Flagyl 04/21/18  cefipime 04/21/18  HPI/Subjective: Lying in bed awake alert. Reports not feeling much better. Denies  pain  Pt hx hiv, asthma, admitted influenza A in setting of asthma exacerbation. Hemodynamically stable not hypoxic. IV fluids, tamiflu, solumedrol, scheduled nebs.   Objective: Vitals:   04/22/18 0800 04/22/18 0935  BP: 120/73 119/76  Pulse: 95 87  Resp: 18 18  Temp:  98.7 F (37.1 C)  SpO2: 95% 93%    Intake/Output Summary (Last 24 hours) at 04/22/2018 1108 Last data filed at 04/22/2018 0830 Gross per 24 hour  Intake 2062.49 ml  Output -  Net 2062.49 ml   Filed Weights   04/21/18 2114  Weight: 67.6 kg    Exam:   General:  Awake alert appears ill but not toxic  Cardiovascular: rrr no mgr no LE edema  Respiratory: mild increased work of breathing with conversation, diffuse wheeze with underlying rhonchi. No crackles  Abdomen: non-tender no guarding or rebounding +BS  Musculoskeletal: joints without swelling/erythema   Data Reviewed: Basic Metabolic Panel: Recent Labs  Lab 04/21/18 1942 04/22/18 0242  NA 131* 137  K 3.7 3.3*  CL 103 111  CO2 17* 15*  GLUCOSE 134* 140*  BUN 9 9  CREATININE 0.98 0.74  CALCIUM 8.1* 7.5*   Liver Function Tests: Recent Labs  Lab 04/21/18 1942  AST 22  ALT 17  ALKPHOS 102  BILITOT 1.0  PROT 6.9  ALBUMIN 3.4*   Recent Labs  Lab 04/21/18 1942  LIPASE 25   No results for input(s): AMMONIA in the last 168 hours. CBC: Recent Labs  Lab 04/21/18 1942 04/22/18 0242  WBC 8.3 6.0  NEUTROABS 6.1  --   HGB 13.4 12.2  HCT 42.8 38.3  MCV 100.0 100.8*  PLT 236 205   Cardiac Enzymes: No results for input(s): CKTOTAL, CKMB, CKMBINDEX, TROPONINI in the last 168 hours. BNP (last 3 results) Recent Labs    04/21/18 1942  BNP 27.9  ProBNP (last 3 results) No results for input(s): PROBNP in the last 8760 hours.  CBG: No results for input(s): GLUCAP in the last 168 hours.  No results found for this or any previous visit (from the past 240 hour(s)).   Studies: Ct Abdomen Pelvis W Contrast  Result Date:  04/22/2018 CLINICAL DATA:  Generalized abdominal pain, fever EXAM: CT ABDOMEN AND PELVIS WITH CONTRAST TECHNIQUE: Multidetector CT imaging of the abdomen and pelvis was performed using the standard protocol following bolus administration of intravenous contrast. CONTRAST:  133mL OMNIPAQUE IOHEXOL 300 MG/ML  SOLN COMPARISON:  12/18/2016 FINDINGS: Lower chest: Esophagus is fluid-filled. Small to moderate-sized hiatal hernia. No confluent opacities or effusions. Hepatobiliary: No focal liver abnormality is seen. Status post cholecystectomy. No biliary dilatation. Pancreas: No focal abnormality or ductal dilatation. Spleen: No focal abnormality.  Normal size. Adrenals/Urinary Tract: No adrenal abnormality. No focal renal abnormality. No stones or hydronephrosis. Urinary bladder is unremarkable. Stomach/Bowel: Postoperative changes in the distal stomach. Moderate stool burden in the colon. No evidence of bowel obstruction. Vascular/Lymphatic: Aortic atherosclerosis. No enlarged abdominal or pelvic lymph nodes. Reproductive: Prior hysterectomy.  No adnexal masses. Other: No free fluid or free air. Musculoskeletal: No acute bony abnormality. IMPRESSION: Small to moderate-sized hiatal hernia. Fluid-filled distal esophagus may be related to reflux. Moderate stool burden in the colon. Aortic atherosclerosis. No acute findings. Electronically Signed   By: Rolm Baptise M.D.   On: 04/22/2018 00:15   Dg Chest Portable 1 View  Result Date: 04/21/2018 CLINICAL DATA:  Fever, cough EXAM: PORTABLE CHEST 1 VIEW COMPARISON:  04/28/2017 FINDINGS: Heart and mediastinal contours are within normal limits. No focal opacities or effusions. No acute bony abnormality. IMPRESSION: No active disease. Electronically Signed   By: Rolm Baptise M.D.   On: 04/21/2018 19:51    Scheduled Meds: . amitriptyline  50 mg Oral QHS  . aspirin EC  81 mg Oral Daily  . atorvastatin  40 mg Oral q1800  . clopidogrel  75 mg Oral Daily  . dolutegravir  50  mg Oral Daily   And  . lamiVUDine  300 mg Oral Daily  . enoxaparin (LOVENOX) injection  40 mg Subcutaneous QHS  . ipratropium  0.5 mg Nebulization Q4H  . levalbuterol  1.25 mg Nebulization Q6H  . methylPREDNISolone (SOLU-MEDROL) injection  60 mg Intravenous BID  . mometasone-formoterol  2 puff Inhalation BID  . oseltamivir  75 mg Oral BID  . pantoprazole  40 mg Oral Daily  . Pirfenidone  534 mg Oral BID   Continuous Infusions: . sodium chloride 100 mL/hr at 04/22/18 0745  . lactated ringers Stopped (04/21/18 2147)    Principal Problem:   Influenza due to identified novel influenza A virus with other respiratory manifestations Active Problems:   IPF (idiopathic pulmonary fibrosis) (HCC)   Asthma exacerbation   CAD (coronary artery disease)   HIV (human immunodeficiency virus infection) (H[facility])   Dyslipidemia   GERD (gastroesophageal reflux disease)    Time spent: 54 minutes    [place] NP  [facility]  If 7PM-7AM, please contact night-coverage at [email or web address], password [facility] 04/22/2018, 11:08 AM  LOS: 0 days

## 2018-04-22 NOTE — Progress Notes (Signed)
Patient being transferred to [place].  Patient report given to nurse Latre. Marcille Blanco, RN

## 2018-04-22 NOTE — Progress Notes (Signed)
Patient transferred from [facility] to room 29. Patient alert oriented X four, on room air no c/o pain. She is in bed locked a lower level, bedside table, call light and telephone within reach. Will continue to monitor patient.

## 2018-04-22 NOTE — ED Notes (Signed)
Attempted to call report x 1  

## 2018-04-23 ENCOUNTER — Encounter (HOSPITAL_COMMUNITY): Payer: Self-pay | Admitting: *Deleted

## 2018-04-23 ENCOUNTER — Inpatient Hospital Stay (HOSPITAL_COMMUNITY): Payer: Medicare Other

## 2018-04-23 DIAGNOSIS — R0602 Shortness of breath: Secondary | ICD-10-CM

## 2018-04-23 LAB — BASIC METABOLIC PANEL
Anion gap: 10 (ref 5–15)
BUN: 10 mg/dL (ref 8–23)
CO2: 16 mmol/L — ABNORMAL LOW (ref 22–32)
Calcium: 8 mg/dL — ABNORMAL LOW (ref 8.9–10.3)
Chloride: 117 mmol/L — ABNORMAL HIGH (ref 98–111)
Creatinine, Ser: 0.65 mg/dL (ref 0.44–1.00)
GFR calc Af Amer: >60 mL/min (ref >60)
GFR calc non Af Amer: >60 mL/min (ref >60)
Glucose, Bld: 161 mg/dL — ABNORMAL HIGH (ref 70–99)
Potassium: 3.7 mmol/L (ref 3.5–5.1)
Sodium: 143 mmol/L (ref 135–145)

## 2018-04-23 LAB — CBC
HCT: 35.7 % — ABNORMAL LOW (ref 36.0–46.0)
Hemoglobin: 11.8 g/dL — ABNORMAL LOW (ref 12.0–15.0)
MCH: 32.7 pg (ref 26.0–34.0)
MCHC: 33.1 g/dL (ref 30.0–36.0)
MCV: 98.9 fL (ref 80.0–100.0)
Platelets: 197 10*3/uL (ref 150–400)
RBC: 3.61 MIL/uL — ABNORMAL LOW (ref 3.87–5.11)
RDW: 13.8 % (ref 11.5–15.5)
WBC: 10.6 10*3/uL — ABNORMAL HIGH (ref 4.0–10.5)
nRBC: 0 % (ref 0.0–0.2)

## 2018-04-23 LAB — ECHOCARDIOGRAM COMPLETE
Height: 59 in
Weight: 2479.73 oz

## 2018-04-23 LAB — BRAIN NATRIURETIC PEPTIDE: B Natriuretic Peptide: 131.4 pg/mL — ABNORMAL HIGH (ref 0.0–100.0)

## 2018-04-23 MED ORDER — POLYETHYLENE GLYCOL 3350 17 G PO PACK
17.0000 g | PACK | Freq: Two times a day (BID) | ORAL | Status: DC
  Administered 2018-04-23 (×2): 17 g via ORAL
  Filled 2018-04-23 (×3): qty 1

## 2018-04-23 MED ORDER — BISACODYL 5 MG PO TBEC
10.0000 mg | DELAYED_RELEASE_TABLET | Freq: Once | ORAL | Status: AC
  Administered 2018-04-23: 10 mg via ORAL
  Filled 2018-04-23: qty 2

## 2018-04-23 MED ORDER — IPRATROPIUM BROMIDE 0.02 % IN SOLN
0.5000 mg | Freq: Two times a day (BID) | RESPIRATORY_TRACT | Status: DC
  Administered 2018-04-23 – 2018-04-24 (×3): 0.5 mg via RESPIRATORY_TRACT
  Filled 2018-04-23 (×3): qty 2.5

## 2018-04-23 MED ORDER — FUROSEMIDE 10 MG/ML IJ SOLN
40.0000 mg | Freq: Once | INTRAMUSCULAR | Status: AC
  Administered 2018-04-23: 40 mg via INTRAVENOUS
  Filled 2018-04-23: qty 4

## 2018-04-23 MED ORDER — LEVALBUTEROL HCL 1.25 MG/0.5ML IN NEBU
1.2500 mg | INHALATION_SOLUTION | Freq: Two times a day (BID) | RESPIRATORY_TRACT | Status: DC
  Administered 2018-04-23 – 2018-04-24 (×3): 1.25 mg via RESPIRATORY_TRACT
  Filled 2018-04-23 (×3): qty 0.5

## 2018-04-23 MED ORDER — POTASSIUM CHLORIDE CRYS ER 20 MEQ PO TBCR
20.0000 meq | EXTENDED_RELEASE_TABLET | Freq: Once | ORAL | Status: AC
  Administered 2018-04-23: 20 meq via ORAL
  Filled 2018-04-23: qty 1

## 2018-04-23 NOTE — Progress Notes (Signed)
Pt ambulated on RA with PT in hallway. O2 sats were recorded throughout and pts o2 sat remained WNL.

## 2018-04-23 NOTE — Evaluation (Signed)
Physical Therapy Evaluation Patient Details Name: [name] [name] MRN: [id number] DOB: [birth year] Today's Date: 04/23/2018   History of Present Illness  [name] [name] is a 68 y.o. female with medical history significant of HIV (CD4 290, 03/2018), hypertension, hyperlipidemia, asthma, pulmonary fibrosis, GERD, depression, CAD, PUD, who presents with fever, chills, abdominal pain.  Positive for influenza A.  Clinical Impression  Patient presents with decreased independence with mobility due to deficits listed in PT problem list.  Currently S to CGA with RW for mobility.  Encouraged walker use in the home as has had falls and pt reports will have daughter assist to get into apartment.  Reports was set up for outpatient cardiac rehab, but has been unable to go due to multiple hospitalizations and illnesses.  Feel she needs HHPT.  Will follow until d/c.    Follow Up Recommendations Home health PT;Supervision for mobility/OOB    Equipment Recommendations  3in1 (PT)    Recommendations for Other Services       Precautions / Restrictions Precautions Precautions: Fall Precaution Comments: 6 falls in 6 months per pt      Mobility  Bed Mobility Overal bed mobility: Modified Independent             General bed mobility comments: sit to supine S  Transfers Overall transfer level: Needs assistance Equipment used: None Transfers: Sit to/from Stand Sit to Stand: Min guard         General transfer comment: for safety/balance  Ambulation/Gait Ambulation/Gait assistance: Min guard Gait Distance (Feet): 90 Feet Assistive device: Rolling walker (2 wheeled) Gait Pattern/deviations: Step-through pattern;Decreased stride length     General Gait Details: limited due to pt c/o her legs getting weak  Stairs            Wheelchair Mobility    Modified Rankin (Stroke Patients Only)       Balance Overall balance assessment: Needs assistance   Sitting balance-Leahy Scale: Good     Standing balance support: Bilateral upper extremity supported Standing balance-Leahy Scale: Poor                               Pertinent Vitals/Pain Pain Assessment: Faces Pain Score: 0-No pain(just had medication)    Home Living Family/patient expects to be discharged to:: Private residence Living Arrangements: Children Available Help at Discharge: Family Type of Home: Apartment Home Access: Stairs to enter Entrance Stairs-Rails: Psychiatric nurse of Steps: flight Home Layout: One level Home Equipment: Environmental consultant - 2 wheels;Cane - single point      Prior Function Level of Independence: Needs assistance   Gait / Transfers Assistance Needed: daughter assisting with ADL, pt does not move unless daughter is home           Hand Dominance        Extremity/Trunk Assessment   Upper Extremity Assessment Upper Extremity Assessment: Generalized weakness    Lower Extremity Assessment Lower Extremity Assessment: RLE deficits/detail;LLE deficits/detail RLE Deficits / Details: AROM limited ankle DF, but PROM WFL; hip flexion 3-/5, knee extension 4-/5 LLE Deficits / Details: AROM WFL, strength hip flex 3+/5, knee extension 4/5    Cervical / Trunk Assessment Cervical / Trunk Assessment: Kyphotic  Communication   Communication: No difficulties  Cognition Arousal/Alertness: Awake/alert Behavior During Therapy: WFL for tasks assessed/performed Overall Cognitive Status: No family/caregiver present to determine baseline cognitive functioning  General Comments      Exercises     Assessment/Plan    PT Assessment Patient needs continued PT services  PT Problem List Decreased strength;Decreased activity tolerance;Decreased balance;Decreased mobility;Decreased knowledge of use of DME       PT Treatment Interventions DME instruction;Therapeutic exercise;Balance training;Gait training;Stair  training;Functional mobility training;Therapeutic activities;Patient/family education    PT Goals (Current goals can be found in the Care Plan section)  Acute Rehab PT Goals Patient Stated Goal: to go home PT Goal Formulation: With patient Time For Goal Achievement: 04/30/18 Potential to Achieve Goals: Good    Frequency Min 3X/week   Barriers to discharge        Co-evaluation               AM-PAC PT "6 Clicks" Mobility  Outcome Measure Help needed turning from your back to your side while in a flat bed without using bedrails?: None Help needed moving from lying on your back to sitting on the side of a flat bed without using bedrails?: None Help needed moving to and from a bed to a chair (including a wheelchair)?: A Little Help needed standing up from a chair using your arms (e.g., wheelchair or bedside chair)?: None Help needed to walk in hospital room?: A Little Help needed climbing 3-5 steps with a railing? : A Little 6 Click Score: 21    End of Session Equipment Utilized During Treatment: Gait belt Activity Tolerance: Patient limited by fatigue Patient left: in bed;with call bell/phone within reach;with bed alarm set   PT Visit Diagnosis: Other abnormalities of gait and mobility (R26.89);History of falling (Z91.81)    Time: 6314-9702 PT Time Calculation (min) (ACUTE ONLY): 26 min   Charges:   PT Evaluation $PT Eval Moderate Complexity: 1 Mod PT Treatments $Gait Training: 8-22 mins        Magda Kiel, [place] [facility] [phone] 04/23/2018   Reginia Naas 04/23/2018, 11:16 AM

## 2018-04-23 NOTE — Progress Notes (Signed)
  Echocardiogram 2D Echocardiogram has been performed.  [name] [name] 04/23/2018, 5:00 PM

## 2018-04-23 NOTE — Progress Notes (Signed)
PROGRESS NOTE                                                                                                                                                                                                             Patient Demographics:    [name] [name], is a 68 y.o. female, DOB - 22-May-1950, OXB:[id number]  Admit date - 04/21/2018   Admitting Physician Ivor Costa, MD  Outpatient Primary MD for the patient is System, Pcp Not In  LOS - 1  CC - SOB     Brief Narrative  [name] [name] is a 68 y.o. female with a Past Medical History of HIV, CAD, GERD, IPF, and asthma; who presents with shortness of breath to have influenza A.    Subjective:    [name] [name] today has, No headache, No chest pain, No abdominal pain - No Nausea, No new weakness tingling or numbness, No Cough - improved SOB.     Assessment  & Plan :     1.  Acute hypoxic respiratory failure with wheezing due to influenza A bronchitis.  Also suspect she is developing some acute on chronic diastolic CHF.  Continue Tamiflu, for now steroids will be continued as she has significant wheezing, oral antibiotics, continue supportive care with oxygen nebulizer treatments.  Requested her to sit up in chair and use flutter valve for pulmonary toiletry, will ambulate and check ambulatory pulse ox.  Stop all IV fluids, will check echocardiogram and start trial of Lasix.   2.  Wheezing.  Most likely due to acute on chronic diastolic CHF.  Stop fluids, IV Lasix, check BNP and echo.  3.  History of HIV.  Continue home medications follow with ID outpatient post discharge.  4.  Dyslipidemia.  On home dose statin.  5.  Underlying history of CAD.  No acute issues no chest pain, continue dual antiplatelet therapy, statin and Imdur for secondary prevention.  6.  GERD.  On PPI continue.  7.  Underlying history of asthma.  Mild wheezing, continue steroids although I think wheezing could be secondary to fluid overload rather than asthma  exacerbation.  8. Stool Burden noted on CT abdomen pelvis.  Bowel regimen initiated.    Family Communication  :  None  Code Status :  Full  Disposition Plan  :  Likely Home in am  Consults  :  None  Procedures  :    CT - Small to moderate-sized hiatal hernia. Fluid-filled distal esophagus may be related  to reflux. Moderate stool burden in the colon. Aortic atherosclerosis. No acute findings.  DVT Prophylaxis  :  Lovenox    Lab Results  Component Value Date   PLT 197 04/23/2018    Diet :  Diet Order            Diet regular Room service appropriate? Yes; Fluid consistency: Thin  Diet effective now               Inpatient Medications Scheduled Meds: . amitriptyline  50 mg Oral QHS  . aspirin EC  81 mg Oral Daily  . atorvastatin  40 mg Oral q1800  . clopidogrel  75 mg Oral Daily  . dolutegravir  50 mg Oral Daily   And  . lamiVUDine  300 mg Oral Daily  . enoxaparin (LOVENOX) injection  40 mg Subcutaneous QHS  . ipratropium  0.5 mg Nebulization BID  . levalbuterol  1.25 mg Nebulization BID  . methylPREDNISolone (SOLU-MEDROL) injection  60 mg Intravenous BID  . mometasone-formoterol  2 puff Inhalation BID  . oseltamivir  75 mg Oral BID  . pantoprazole  40 mg Oral Daily  . Pirfenidone  534 mg Oral BID   Continuous Infusions: . lactated ringers Stopped (04/21/18 2147)   PRN Meds:.acetaminophen **OR** [DISCONTINUED] acetaminophen, dextromethorphan-guaiFENesin, docusate sodium, hydrALAZINE, morphine injection, nitroGLYCERIN, [DISCONTINUED] ondansetron **OR** ondansetron (ZOFRAN) IV, senna-docusate  Antibiotics  :   Anti-infectives (From admission, onward)   Start     Dose/Rate Route Frequency Ordered Stop   04/23/18 0900  vancomycin (VANCOCIN) 1,250 mg in sodium chloride 0.9 % 250 mL IVPB  Status:  Discontinued     1,250 mg 166.7 mL/hr over 90 Minutes Intravenous Every 36 hours 04/21/18 2135 04/22/18 0824   04/22/18 2100  ceFEPIme (MAXIPIME) 2 g in sodium chloride  0.9 % 100 mL IVPB  Status:  Discontinued     2 g 200 mL/hr over 30 Minutes Intravenous Every 24 hours 04/21/18 2135 04/22/18 0824   04/22/18 1015  dolutegravir (TIVICAY) tablet 50 mg     50 mg Oral Daily 04/22/18 1004     04/22/18 1015  lamiVUDine (EPIVIR) tablet 300 mg     300 mg Oral Daily 04/22/18 1004     04/22/18 1000  Dolutegravir-lamiVUDine 50-300 MG TABS 1 tablet  Status:  Discontinued     1 tablet Oral Daily 04/21/18 2342 04/22/18 1004   04/22/18 0030  oseltamivir (TAMIFLU) capsule 75 mg     75 mg Oral 2 times daily 04/22/18 0018     04/21/18 2145  vancomycin (VANCOCIN) 500 mg in sodium chloride 0.9 % 100 mL IVPB     500 mg 100 mL/hr over 60 Minutes Intravenous  Once 04/21/18 2135 04/22/18 0228   04/21/18 2115  ceFEPIme (MAXIPIME) 2 g in sodium chloride 0.9 % 100 mL IVPB     2 g 200 mL/hr over 30 Minutes Intravenous  Once 04/21/18 2107 04/21/18 2146   04/21/18 2115  metroNIDAZOLE (FLAGYL) IVPB 500 mg  Status:  Discontinued     500 mg 100 mL/hr over 60 Minutes Intravenous Every 8 hours 04/21/18 2107 04/22/18 0824   04/21/18 2115  vancomycin (VANCOCIN) IVPB 1000 mg/200 mL premix     1,000 mg 200 mL/hr over 60 Minutes Intravenous  Once 04/21/18 2107 04/22/18 0035          Objective:   Vitals:   04/22/18 2056 04/22/18 2150 04/23/18 0536 04/23/18 0921  BP:  113/64 139/79   Pulse:  99 92  Resp:  17 20   Temp:  98.6 F (37 C) 97.9 F (36.6 C)   TempSrc:  Oral Oral   SpO2: 96% 94% 99% 99%  Weight:      Height:        Wt Readings from Last 3 Encounters:  04/22/18 70.3 kg  09/26/16 65.2 kg  09/21/16 64.2 kg     Intake/Output Summary (Last 24 hours) at 04/23/2018 1021 Last data filed at 04/22/2018 1515 Gross per 24 hour  Intake 750 ml  Output -  Net 750 ml     Physical Exam  Awake Alert, Oriented X 3, No new F.N deficits, Normal affect [place].AT,PERRAL Supple Neck,No JVD, No cervical lymphadenopathy appriciated.  Symmetrical Chest wall movement, Good air  movement bilaterally, mild wheezing RRR,No Gallops,Rubs or new Murmurs, No Parasternal Heave +ve B.Sounds, Abd Soft, No tenderness, No organomegaly appriciated, No rebound - guarding or rigidity. No Cyanosis, Clubbing or edema, No new Rash or bruise      Data Review:    CBC Recent Labs  Lab 04/21/18 1942 04/22/18 0242 04/23/18 0342  WBC 8.3 6.0 10.6*  HGB 13.4 12.2 11.8*  HCT 42.8 38.3 35.7*  PLT 236 205 197  MCV 100.0 100.8* 98.9  MCH 31.3 32.1 32.7  MCHC 31.3 31.9 33.1  RDW 13.8 13.9 13.8  LYMPHSABS 0.6*  --   --   MONOABS 1.5*  --   --   EOSABS 0.0  --   --   BASOSABS 0.0  --   --     Chemistries  Recent Labs  Lab 04/21/18 1942 04/22/18 0242 04/23/18 0342  NA 131* 137 143  K 3.7 3.3* 3.7  CL 103 111 117*  CO2 17* 15* 16*  GLUCOSE 134* 140* 161*  BUN 9 9 10   CREATININE 0.98 0.74 0.65  CALCIUM 8.1* 7.5* 8.0*  MG  --  2.1  --   AST 22  --   --   ALT 17  --   --   ALKPHOS 102  --   --   BILITOT 1.0  --   --    ------------------------------------------------------------------------------------------------------------------ No results for input(s): CHOL, HDL, LDLCALC, TRIG, CHOLHDL, LDLDIRECT in the last 72 hours.  No results found for: HGBA1C ------------------------------------------------------------------------------------------------------------------ No results for input(s): TSH, T4TOTAL, T3FREE, THYROIDAB in the last 72 hours.  Invalid input(s): FREET3 ------------------------------------------------------------------------------------------------------------------ No results for input(s): VITAMINB12, FOLATE, FERRITIN, TIBC, IRON, RETICCTPCT in the last 72 hours.  Coagulation profile No results for input(s): INR, PROTIME in the last 168 hours.  No results for input(s): DDIMER in the last 72 hours.  Cardiac Enzymes No results for input(s): CKMB, TROPONINI, MYOGLOBIN in the last 168 hours.  Invalid input(s):  CK ------------------------------------------------------------------------------------------------------------------    Component Value Date/Time   BNP 27.9 04/21/2018 1942    Micro Results Recent Results (from the past 240 hour(s))  Blood culture (routine x 2)     Status: None (Preliminary result)   Collection Time: 04/21/18  7:42 PM  Result Value Ref Range Status   Specimen Description BLOOD RIGHT HAND  Final   Special Requests   Final    BOTTLES DRAWN AEROBIC AND ANAEROBIC Blood Culture results may not be optimal due to an inadequate volume of blood received in culture bottles   Culture   Final    NO GROWTH 2 DAYS Performed at [facility] [address], [place], [place] [address]    Report Status PENDING  Incomplete  Blood culture (routine x  2)     Status: None (Preliminary result)   Collection Time: 04/21/18  8:01 PM  Result Value Ref Range Status   Specimen Description BLOOD LEFT UPPER ARM  Final   Special Requests   Final    BOTTLES DRAWN AEROBIC AND ANAEROBIC Blood Culture results may not be optimal due to an inadequate volume of blood received in culture bottles   Culture   Final    NO GROWTH 2 DAYS Performed at [facility], [place] [address] [place], [place] [address]    Report Status PENDING  Incomplete  Urine culture     Status: Abnormal (Preliminary result)   Collection Time: 04/22/18 12:29 AM  Result Value Ref Range Status   Specimen Description URINE, CLEAN CATCH  Final   Special Requests   Final    NONE Performed at [facility], [place] [address] [place], [place] [address]    Culture 80,000 COLONIES/mL GRAM NEGATIVE RODS (A)  Final   Report Status PENDING  Incomplete    Radiology Reports Ct Abdomen Pelvis W Contrast  Result Date: 04/22/2018 CLINICAL DATA:  Generalized abdominal pain, fever EXAM: CT ABDOMEN AND PELVIS WITH CONTRAST TECHNIQUE: Multidetector CT imaging of the abdomen and pelvis was performed using the standard protocol  following bolus administration of intravenous contrast. CONTRAST:  154mL OMNIPAQUE IOHEXOL 300 MG/ML  SOLN COMPARISON:  12/18/2016 FINDINGS: Lower chest: Esophagus is fluid-filled. Small to moderate-sized hiatal hernia. No confluent opacities or effusions. Hepatobiliary: No focal liver abnormality is seen. Status post cholecystectomy. No biliary dilatation. Pancreas: No focal abnormality or ductal dilatation. Spleen: No focal abnormality.  Normal size. Adrenals/Urinary Tract: No adrenal abnormality. No focal renal abnormality. No stones or hydronephrosis. Urinary bladder is unremarkable. Stomach/Bowel: Postoperative changes in the distal stomach. Moderate stool burden in the colon. No evidence of bowel obstruction. Vascular/Lymphatic: Aortic atherosclerosis. No enlarged abdominal or pelvic lymph nodes. Reproductive: Prior hysterectomy.  No adnexal masses. Other: No free fluid or free air. Musculoskeletal: No acute bony abnormality. IMPRESSION: Small to moderate-sized hiatal hernia. Fluid-filled distal esophagus may be related to reflux. Moderate stool burden in the colon. Aortic atherosclerosis. No acute findings. Electronically Signed   By: Rolm Baptise M.D.   On: 04/22/2018 00:15   Dg Chest Portable 1 View  Result Date: 04/21/2018 CLINICAL DATA:  Fever, cough EXAM: PORTABLE CHEST 1 VIEW COMPARISON:  04/28/2017 FINDINGS: Heart and mediastinal contours are within normal limits. No focal opacities or effusions. No acute bony abnormality. IMPRESSION: No active disease. Electronically Signed   By: Rolm Baptise M.D.   On: 04/21/2018 19:51    Time Spent in minutes  30   Lala Lund M.D on 04/23/2018 at 10:21 AM  To page go to [email or web address] - password [email or web address]

## 2018-04-24 ENCOUNTER — Ambulatory Visit (HOSPITAL_COMMUNITY): Payer: Self-pay

## 2018-04-24 DIAGNOSIS — I25118 Atherosclerotic heart disease of native coronary artery with other forms of angina pectoris: Secondary | ICD-10-CM

## 2018-04-24 LAB — URINE CULTURE: Culture: 80000 — AB

## 2018-04-24 LAB — BASIC METABOLIC PANEL
Anion gap: 7 (ref 5–15)
BUN: 14 mg/dL (ref 8–23)
CO2: 24 mmol/L (ref 22–32)
Calcium: 8.2 mg/dL — ABNORMAL LOW (ref 8.9–10.3)
Chloride: 110 mmol/L (ref 98–111)
Creatinine, Ser: 0.8 mg/dL (ref 0.44–1.00)
GFR calc non Af Amer: >60 mL/min (ref >60)
Glucose, Bld: 129 mg/dL — ABNORMAL HIGH (ref 70–99)
Potassium: 4.3 mmol/L (ref 3.5–5.1)
SODIUM: 141 mmol/L (ref 135–145)

## 2018-04-24 MED ORDER — OSELTAMIVIR PHOSPHATE 75 MG PO CAPS: 75.0000 mg | ORAL_CAPSULE | Freq: Two times a day (BID) | ORAL | 0 refills | Status: AC

## 2018-04-24 MED ORDER — DM-GUAIFENESIN ER 30-600 MG PO TB12: 1.0000 | ORAL_TABLET | Freq: Two times a day (BID) | ORAL | 0 refills | Status: DC | PRN

## 2018-04-24 MED ORDER — PREDNISONE 20 MG PO TABS: 40.0000 mg | ORAL_TABLET | Freq: Every day | ORAL | 0 refills | Status: AC

## 2018-04-24 NOTE — Discharge Summary (Signed)
Physician Discharge Summary  [name] [name] ESP:[id number] DOB: 07-10-1950 DOA: 04/21/2018  PCP: System, Pcp Not In  Admit date: 04/21/2018 Discharge date: 04/24/2018  Admitted From: Home Disposition:  Home  Recommendations for Outpatient Follow-up:  1. Follow up with PCP in 1 week   Home Health: Home health PT and RN Equipment/Devices: None  Discharge Condition: Stable CODE STATUS: Full Diet recommendation: Heart Healthy   Brief/Interim Summary: 68 year old female with history of HIV, CAD, GERD, asthma, IPF presented with shortness of breath and was found to have influenza A.  She initially required oxygen supplementation and was wheezing.  She was started on intravenous steroids.  Her respiratory status has improved.  IV fluids were stopped, she was given a dose of Lasix.  She is currently on room air.  He will be discharged on oral Tamiflu and prednisone.  Discharge Diagnoses:  Principal Problem:   Influenza due to identified novel influenza A virus with other respiratory manifestations Active Problems:   CAD (coronary artery disease)   Dyslipidemia   GERD (gastroesophageal reflux disease)   IPF (idiopathic pulmonary fibrosis) (HCC)   HIV (human immunodeficiency virus infection) (HCC)   Asthma exacerbation   Hypokalemia  Acute hypoxic respiratory failure -Probably from influenza A bronchitis.  Initially required oxygen supplementation -Improved.  Currently on room air  Influenza A bronchitis -Continue Tamiflu to complete 5-day course of therapy.  Respiratory status stable.  Patient required intravenous Solu-Medrol for wheezing.  Wheezing is improved.  Will discharge on prednisone 40 mg daily for 5 days.  Probable acute on chronic diastolic CHF -Has received 1 dose of intravenous Lasix yesterday.  Currently looks euvolemic.  Continue metoprolol, isosorbide mononitrate.  Outpatient follow-up with cardiology  History of HIV -Continue current regimen.  Outpatient follow-up with  ID  Dyslipidemia next-continue statin  History of CAD -Stable.  Continue dual antiplatelet therapy, statin, beta-blocker and Imdur  GERD -Continue PPI  Underlying history of asthma -Might benefit from outpatient pulmonary evaluation   Discharge Instructions  Discharge Instructions    Call MD for:  difficulty breathing, headache or visual disturbances   Complete by:  As directed    Call MD for:  extreme fatigue   Complete by:  As directed    Call MD for:  hives   Complete by:  As directed    Call MD for:  persistant dizziness or light-headedness   Complete by:  As directed    Call MD for:  persistant nausea and vomiting   Complete by:  As directed    Call MD for:  severe uncontrolled pain   Complete by:  As directed    Call MD for:  temperature >100.4   Complete by:  As directed    Diet - low sodium heart healthy   Complete by:  As directed    Increase activity slowly   Complete by:  As directed      Allergies as of 04/24/2018      Reactions   Isosorbide Nitrate Other (See Comments)   Headaches   Sulfamethoxazole-trimethoprim Hives, Rash      Medication List    STOP taking these medications   ondansetron 4 MG tablet Commonly known as:  ZOFRAN     TAKE these medications   albuterol 108 (90 Base) MCG/ACT inhaler Commonly known as:  PROVENTIL HFA;VENTOLIN HFA Inhale 2 puffs into the lungs every 4 (four) hours as needed for wheezing or shortness of breath.   amitriptyline 50 MG tablet Commonly known as:  ELAVIL  Take 50 mg by mouth at bedtime.   aspirin EC 81 MG tablet Take 1 tablet (81 mg total) by mouth daily.   atorvastatin 40 MG tablet Commonly known as:  LIPITOR Take 40 mg by mouth daily at 6 PM.   clopidogrel 75 MG tablet Commonly known as:  PLAVIX Take 75 mg by mouth daily.   conjugated estrogens vaginal cream Commonly known as:  PREMARIN Place 7.10 Applicatorfuls vaginally every Wednesday. Pt only to use 1/4 of the applicator    dextromethorphan-guaiFENesin 30-600 MG 12hr tablet Commonly known as:  MUCINEX DM Take 1 tablet by mouth 2 (two) times daily as needed for cough.   docusate sodium 100 MG capsule Commonly known as:  COLACE Take 100 mg by mouth daily as needed for mild constipation.   DOVATO 50-300 MG Tabs Generic drug:  Dolutegravir-lamiVUDine Take 1 tablet by mouth daily.   ESBRIET 267 MG Caps Generic drug:  Pirfenidone Take 534 mg by mouth 2 (two) times daily.   isosorbide mononitrate 30 MG 24 hr tablet Commonly known as:  IMDUR Take 1 tablet (30 mg total) by mouth daily.   metoprolol tartrate 25 MG tablet Commonly known as:  LOPRESSOR Take 12.5 mg by mouth 2 (two) times daily.   nitroGLYCERIN 0.4 MG SL tablet Commonly known as:  NITROSTAT Place 1 tablet (0.4 mg total) under the tongue every 5 (five) minutes as needed for chest pain.   omeprazole 40 MG capsule Commonly known as:  PRILOSEC Take 40 mg by mouth 2 (two) times daily.   oseltamivir 75 MG capsule Commonly known as:  TAMIFLU Take 1 capsule (75 mg total) by mouth 2 (two) times daily for 3 days.   polyethylene glycol packet Commonly known as:  MIRALAX / GLYCOLAX Take 17 g by mouth daily as needed (constipation).   predniSONE 20 MG tablet Commonly known as:  DELTASONE Take 2 tablets (40 mg total) by mouth daily for 5 days.   SYMBICORT 160-4.5 MCG/ACT inhaler Generic drug:  budesonide-formoterol Inhale 2 puffs into the lungs 2 (two) times daily.      Follow-up Information    PCP. Schedule an appointment as soon as possible for a visit in 1 week(s).          Allergies  Allergen Reactions  . Isosorbide Nitrate Other (See Comments)    Headaches  . Sulfamethoxazole-Trimethoprim Hives and Rash    Consultations:  None   Procedures/Studies: Ct Abdomen Pelvis W Contrast  Result Date: 04/22/2018 CLINICAL DATA:  Generalized abdominal pain, fever EXAM: CT ABDOMEN AND PELVIS WITH CONTRAST TECHNIQUE: Multidetector CT  imaging of the abdomen and pelvis was performed using the standard protocol following bolus administration of intravenous contrast. CONTRAST:  14mL OMNIPAQUE IOHEXOL 300 MG/ML  SOLN COMPARISON:  12/18/2016 FINDINGS: Lower chest: Esophagus is fluid-filled. Small to moderate-sized hiatal hernia. No confluent opacities or effusions. Hepatobiliary: No focal liver abnormality is seen. Status post cholecystectomy. No biliary dilatation. Pancreas: No focal abnormality or ductal dilatation. Spleen: No focal abnormality.  Normal size. Adrenals/Urinary Tract: No adrenal abnormality. No focal renal abnormality. No stones or hydronephrosis. Urinary bladder is unremarkable. Stomach/Bowel: Postoperative changes in the distal stomach. Moderate stool burden in the colon. No evidence of bowel obstruction. Vascular/Lymphatic: Aortic atherosclerosis. No enlarged abdominal or pelvic lymph nodes. Reproductive: Prior hysterectomy.  No adnexal masses. Other: No free fluid or free air. Musculoskeletal: No acute bony abnormality. IMPRESSION: Small to moderate-sized hiatal hernia. Fluid-filled distal esophagus may be related to reflux. Moderate stool burden in the  colon. Aortic atherosclerosis. No acute findings. Electronically Signed   By: Rolm Baptise M.D.   On: 04/22/2018 00:15   Dg Chest Portable 1 View  Result Date: 04/21/2018 CLINICAL DATA:  Fever, cough EXAM: PORTABLE CHEST 1 VIEW COMPARISON:  04/28/2017 FINDINGS: Heart and mediastinal contours are within normal limits. No focal opacities or effusions. No acute bony abnormality. IMPRESSION: No active disease. Electronically Signed   By: Rolm Baptise M.D.   On: 04/21/2018 19:51    Echo IMPRESSIONS    1. The left ventricle has normal systolic function of 49-67%. The cavity size was normal. Left ventricular diastolic Doppler parameters are consistent with impaired relaxation.  2. The right ventricle has normal systolic function. The cavity was normal. There is no increase  in right ventricular wall thickness.  3. The mitral valve is normal in structure.  4. The tricuspid valve is normal in structure.  5. The aortic valve is tricuspid There is mild thickening of the aortic valve.  6. The pulmonic valve was normal in structure.  7. Normal LV systolic function; mild diastolic dysfunction.   Subjective: Patient seen and examined at bedside.  No overnight fever, nausea, vomiting.  She feels better and thinks that she would be okay going home.  Her breathing is improved  Discharge Exam: Vitals:   04/24/18 0607 04/24/18 0829  BP: 133/69   Pulse: 67   Resp: 16   Temp: 98.1 F (36.7 C)   SpO2: 93% 95%   Vitals:   04/23/18 1316 04/23/18 2132 04/24/18 0607 04/24/18 0829  BP: 126/77 112/67 133/69   Pulse: 85 88 67   Resp: 16 18 16    Temp: (!) 97.5 F (36.4 C) 97.6 F (36.4 C) 98.1 F (36.7 C)   TempSrc: Oral Oral Oral   SpO2: 98% 94% 93% 95%  Weight:      Height:        General: Pt is alert, awake, not in acute distress Cardiovascular: rate controlled, S1/S2 + Respiratory: bilateral decreased breath sounds at bases, very minimal wheezing.  Some scattered crackles Abdominal: Soft, NT, ND, bowel sounds + Extremities: no edema, no cyanosis    The results of significant diagnostics from this hospitalization (including imaging, microbiology, ancillary and laboratory) are listed below for reference.     Microbiology: Recent Results (from the past 240 hour(s))  Blood culture (routine x 2)     Status: None (Preliminary result)   Collection Time: 04/21/18  7:42 PM  Result Value Ref Range Status   Specimen Description BLOOD RIGHT HAND  Final   Special Requests   Final    BOTTLES DRAWN AEROBIC AND ANAEROBIC Blood Culture results may not be optimal due to an inadequate volume of blood received in culture bottles   Culture   Final    NO GROWTH 3 DAYS Performed at [facility], [place] [address], [place], [place] [address]    Report Status PENDING   Incomplete  Blood culture (routine x 2)     Status: None (Preliminary result)   Collection Time: 04/21/18  8:01 PM  Result Value Ref Range Status   Specimen Description BLOOD LEFT UPPER ARM  Final   Special Requests   Final    BOTTLES DRAWN AEROBIC AND ANAEROBIC Blood Culture results may not be optimal due to an inadequate volume of blood received in culture bottles   Culture   Final    NO GROWTH 3 DAYS Performed at [facility], [place] [address],  [place], [place] [address]    Report Status PENDING  Incomplete  Urine culture     Status: Abnormal   Collection Time: 04/22/18 12:29 AM  Result Value Ref Range Status   Specimen Description URINE, CLEAN CATCH  Final   Special Requests   Final    NONE Performed at [facility], [place] [address] [place], [place] [address]    Culture 80,000 COLONIES/mL ESCHERICHIA COLI (A)  Final   Report Status 04/24/2018 FINAL  Final   Organism ID, Bacteria ESCHERICHIA COLI (A)  Final      Susceptibility   Escherichia coli - MIC*    AMPICILLIN 8 SENSITIVE Sensitive     CEFAZOLIN <=4 SENSITIVE Sensitive     CEFTRIAXONE <=1 SENSITIVE Sensitive     CIPROFLOXACIN <=0.25 SENSITIVE Sensitive     GENTAMICIN 2 SENSITIVE Sensitive     IMIPENEM <=0.25 SENSITIVE Sensitive     NITROFURANTOIN <=16 SENSITIVE Sensitive     TRIMETH/SULFA <=20 SENSITIVE Sensitive     AMPICILLIN/SULBACTAM <=2 SENSITIVE Sensitive     PIP/TAZO <=4 SENSITIVE Sensitive     Extended ESBL NEGATIVE Sensitive     * 80,000 COLONIES/mL ESCHERICHIA COLI     Labs: BNP (last 3 results) Recent Labs    04/21/18 1942 04/23/18 1044  BNP 27.9 546.2*   Basic Metabolic Panel: Recent Labs  Lab 04/21/18 1942 04/22/18 0242 04/23/18 0342 04/24/18 0337  NA 131* 137 143 141  K 3.7 3.3* 3.7 4.3  CL 103 111 117* 110  CO2 17* 15* 16* 24  GLUCOSE 134* 140* 161* 129*  BUN 9 9 10 14   CREATININE 0.98 0.74 0.65 0.80  CALCIUM 8.1* 7.5* 8.0* 8.2*  MG  --  2.1  --   --    Liver Function  Tests: Recent Labs  Lab 04/21/18 1942  AST 22  ALT 17  ALKPHOS 102  BILITOT 1.0  PROT 6.9  ALBUMIN 3.4*   Recent Labs  Lab 04/21/18 1942  LIPASE 25   No results for input(s): AMMONIA in the last 168 hours. CBC: Recent Labs  Lab 04/21/18 1942 04/22/18 0242 04/23/18 0342  WBC 8.3 6.0 10.6*  NEUTROABS 6.1  --   --   HGB 13.4 12.2 11.8*  HCT 42.8 38.3 35.7*  MCV 100.0 100.8* 98.9  PLT 236 205 197   Cardiac Enzymes: No results for input(s): CKTOTAL, CKMB, CKMBINDEX, TROPONINI in the last 168 hours. BNP: Invalid input(s): POCBNP CBG: No results for input(s): GLUCAP in the last 168 hours. D-Dimer No results for input(s): DDIMER in the last 72 hours. Hgb A1c No results for input(s): HGBA1C in the last 72 hours. Lipid Profile No results for input(s): CHOL, HDL, LDLCALC, TRIG, CHOLHDL, LDLDIRECT in the last 72 hours. Thyroid function studies No results for input(s): TSH, T4TOTAL, T3FREE, THYROIDAB in the last 72 hours.  Invalid input(s): FREET3 Anemia work up No results for input(s): VITAMINB12, FOLATE, FERRITIN, TIBC, IRON, RETICCTPCT in the last 72 hours. Urinalysis    Component Value Date/Time   COLORURINE YELLOW 04/22/2018 0029   APPEARANCEUR CLEAR 04/22/2018 0029   LABSPEC 1.016 04/22/2018 0029   PHURINE 6.0 04/22/2018 0029   GLUCOSEU NEGATIVE 04/22/2018 0029   HGBUR SMALL (A) 04/22/2018 0029   BILIRUBINUR NEGATIVE 04/22/2018 0029   KETONESUR NEGATIVE 04/22/2018 0029   PROTEINUR NEGATIVE 04/22/2018 0029   UROBILINOGEN 4.0 (H) 07/29/2009 0740   NITRITE NEGATIVE 04/22/2018 0029   LEUKOCYTESUR NEGATIVE 04/22/2018 0029   Sepsis Labs Invalid input(s): PROCALCITONIN,  WBC,  [place] [facility] Recent Results (from the past 240 hour(s))  Blood culture (routine x 2)     Status: None (Preliminary result)   Collection Time: 04/21/18  7:42 PM  Result Value Ref Range Status   Specimen Description BLOOD RIGHT HAND  Final   Special Requests   Final     BOTTLES DRAWN AEROBIC AND ANAEROBIC Blood Culture results may not be optimal due to an inadequate volume of blood received in culture bottles   Culture   Final    NO GROWTH 3 DAYS Performed at [facility], [place] [address], [place], [place] [address]    Report Status PENDING  Incomplete  Blood culture (routine x 2)     Status: None (Preliminary result)   Collection Time: 04/21/18  8:01 PM  Result Value Ref Range Status   Specimen Description BLOOD LEFT UPPER ARM  Final   Special Requests   Final    BOTTLES DRAWN AEROBIC AND ANAEROBIC Blood Culture results may not be optimal due to an inadequate volume of blood received in culture bottles   Culture   Final    NO GROWTH 3 DAYS Performed at [facility], [place] [address], [place], [place] [address]    Report Status PENDING  Incomplete  Urine culture     Status: Abnormal   Collection Time: 04/22/18 12:29 AM  Result Value Ref Range Status   Specimen Description URINE, CLEAN CATCH  Final   Special Requests   Final    NONE Performed at [facility], [place] [address], [place], [place] [address]    Culture 80,000 COLONIES/mL ESCHERICHIA COLI (A)  Final   Report Status 04/24/2018 FINAL  Final   Organism ID, Bacteria ESCHERICHIA COLI (A)  Final      Susceptibility   Escherichia coli - MIC*    AMPICILLIN 8 SENSITIVE Sensitive     CEFAZOLIN <=4 SENSITIVE Sensitive     CEFTRIAXONE <=1 SENSITIVE Sensitive     CIPROFLOXACIN <=0.25 SENSITIVE Sensitive     GENTAMICIN 2 SENSITIVE Sensitive     IMIPENEM <=0.25 SENSITIVE Sensitive     NITROFURANTOIN <=16 SENSITIVE Sensitive     TRIMETH/SULFA <=20 SENSITIVE Sensitive     AMPICILLIN/SULBACTAM <=2 SENSITIVE Sensitive     PIP/TAZO <=4 SENSITIVE Sensitive     Extended ESBL NEGATIVE Sensitive     * 80,000 COLONIES/mL ESCHERICHIA COLI     Time coordinating discharge: 35 minutes  SIGNED:   Aline August, MD  [facility] 04/24/2018, 9:54 AM Pager: [phone]  If 7PM-7AM,  please contact night-coverage [email or web address] Password [email or web address]

## 2018-04-24 NOTE — Care Management Note (Signed)
Case Management Note  Patient Details  Name: [name] [name] MRN: [id number] Date of Birth: [birth year]  Subjective/Objective:   Influenza A with respiratory manifestations.Resides with daughter, [name].               [name] (Daughter)     [phone]       PCP:   [facility] ,[place] Dr. Ottie Glazier  Action/Plan: Transition to home today with home health services to follow. States has transportation to home.  Expected Discharge Date:   04/24/2018            Expected Discharge Plan:  [place]  In-House Referral:     Discharge planning Services  CM Consult  Post Acute Care Choice:    Choice offered to:  Patient  DME Arranged:   n/a DME Agency:   n/a  HH Arranged:  PT, RN [place] Agency:  [facility] [facility]  Status of Service:  Completed, signed off  If discussed at [place] of Stay Meetings, dates discussed:    Additional Comments:  Sharin Mons, RN 04/24/2018, 9:44 AM

## 2018-04-26 ENCOUNTER — Ambulatory Visit (HOSPITAL_COMMUNITY): Payer: Self-pay

## 2018-04-26 LAB — CULTURE, BLOOD (ROUTINE X 2)
Culture: NO GROWTH
Culture: NO GROWTH

## 2018-04-29 ENCOUNTER — Ambulatory Visit (HOSPITAL_COMMUNITY): Payer: Self-pay

## 2018-05-01 ENCOUNTER — Ambulatory Visit (HOSPITAL_COMMUNITY): Payer: Self-pay

## 2018-05-03 ENCOUNTER — Ambulatory Visit (HOSPITAL_COMMUNITY): Payer: Self-pay

## 2018-05-06 ENCOUNTER — Ambulatory Visit (HOSPITAL_COMMUNITY): Payer: Self-pay

## 2018-05-08 ENCOUNTER — Ambulatory Visit (HOSPITAL_COMMUNITY): Payer: Self-pay

## 2018-05-10 ENCOUNTER — Ambulatory Visit (HOSPITAL_COMMUNITY): Payer: Self-pay

## 2018-05-13 ENCOUNTER — Ambulatory Visit (HOSPITAL_COMMUNITY): Payer: Self-pay

## 2018-05-15 ENCOUNTER — Ambulatory Visit (HOSPITAL_COMMUNITY): Payer: Self-pay

## 2018-05-17 ENCOUNTER — Ambulatory Visit (HOSPITAL_COMMUNITY): Payer: Self-pay

## 2018-05-20 ENCOUNTER — Ambulatory Visit (HOSPITAL_COMMUNITY): Payer: Self-pay

## 2018-05-22 ENCOUNTER — Ambulatory Visit (HOSPITAL_COMMUNITY): Payer: Self-pay

## 2018-05-24 ENCOUNTER — Ambulatory Visit (HOSPITAL_COMMUNITY): Payer: Self-pay

## 2018-05-27 ENCOUNTER — Ambulatory Visit (HOSPITAL_COMMUNITY): Payer: Self-pay

## 2018-05-29 ENCOUNTER — Ambulatory Visit (HOSPITAL_COMMUNITY): Payer: Self-pay

## 2018-05-31 ENCOUNTER — Ambulatory Visit (HOSPITAL_COMMUNITY): Payer: Self-pay

## 2018-06-03 ENCOUNTER — Ambulatory Visit (HOSPITAL_COMMUNITY): Payer: Self-pay

## 2018-06-05 ENCOUNTER — Ambulatory Visit (HOSPITAL_COMMUNITY): Payer: Self-pay

## 2018-06-07 ENCOUNTER — Ambulatory Visit (HOSPITAL_COMMUNITY): Payer: Self-pay

## 2018-06-10 ENCOUNTER — Ambulatory Visit (HOSPITAL_COMMUNITY): Payer: Self-pay

## 2018-06-12 ENCOUNTER — Ambulatory Visit (HOSPITAL_COMMUNITY): Payer: Self-pay

## 2018-08-12 DIAGNOSIS — R112 Nausea with vomiting, unspecified: Secondary | ICD-10-CM

## 2018-08-12 HISTORY — DX: Nausea with vomiting, unspecified: R11.2

## 2018-09-03 ENCOUNTER — Encounter (HOSPITAL_COMMUNITY): Payer: Self-pay

## 2018-09-03 ENCOUNTER — Other Ambulatory Visit: Payer: Self-pay

## 2018-09-03 ENCOUNTER — Inpatient Hospital Stay (HOSPITAL_COMMUNITY)
Admission: EM | Admit: 2018-09-03 | Discharge: 2018-09-05 | DRG: 690 | Disposition: A | Discharge status: Home Health | Payer: Medicare Other | Attending: Internal Medicine | Admitting: Internal Medicine

## 2018-09-03 ENCOUNTER — Emergency Department (HOSPITAL_COMMUNITY): Payer: Medicare Other

## 2018-09-03 DIAGNOSIS — J84112 Idiopathic pulmonary fibrosis: Secondary | ICD-10-CM | POA: Diagnosis present

## 2018-09-03 DIAGNOSIS — R112 Nausea with vomiting, unspecified: Secondary | ICD-10-CM | POA: Diagnosis present

## 2018-09-03 DIAGNOSIS — Z8711 Personal history of peptic ulcer disease: Secondary | ICD-10-CM

## 2018-09-03 DIAGNOSIS — E872 Acidosis: Secondary | ICD-10-CM | POA: Diagnosis present

## 2018-09-03 DIAGNOSIS — B9620 Unspecified Escherichia coli [E. coli] as the cause of diseases classified elsewhere: Secondary | ICD-10-CM | POA: Diagnosis present

## 2018-09-03 DIAGNOSIS — R0789 Other chest pain: Secondary | ICD-10-CM | POA: Diagnosis not present

## 2018-09-03 DIAGNOSIS — J8410 Pulmonary fibrosis, unspecified: Secondary | ICD-10-CM | POA: Diagnosis present

## 2018-09-03 DIAGNOSIS — Z833 Family history of diabetes mellitus: Secondary | ICD-10-CM

## 2018-09-03 DIAGNOSIS — R42 Dizziness and giddiness: Secondary | ICD-10-CM

## 2018-09-03 DIAGNOSIS — I2510 Atherosclerotic heart disease of native coronary artery without angina pectoris: Secondary | ICD-10-CM | POA: Diagnosis present

## 2018-09-03 DIAGNOSIS — I10 Essential (primary) hypertension: Secondary | ICD-10-CM | POA: Diagnosis present

## 2018-09-03 DIAGNOSIS — J45909 Unspecified asthma, uncomplicated: Secondary | ICD-10-CM | POA: Diagnosis present

## 2018-09-03 DIAGNOSIS — Z823 Family history of stroke: Secondary | ICD-10-CM

## 2018-09-03 DIAGNOSIS — Z20828 Contact with and (suspected) exposure to other viral communicable diseases: Secondary | ICD-10-CM | POA: Diagnosis present

## 2018-09-03 DIAGNOSIS — R1115 Cyclical vomiting syndrome unrelated to migraine: Secondary | ICD-10-CM | POA: Diagnosis present

## 2018-09-03 DIAGNOSIS — N390 Urinary tract infection, site not specified: Secondary | ICD-10-CM | POA: Diagnosis not present

## 2018-09-03 DIAGNOSIS — Z955 Presence of coronary angioplasty implant and graft: Secondary | ICD-10-CM

## 2018-09-03 DIAGNOSIS — B20 Human immunodeficiency virus [HIV] disease: Secondary | ICD-10-CM | POA: Diagnosis present

## 2018-09-03 DIAGNOSIS — Z8249 Family history of ischemic heart disease and other diseases of the circulatory system: Secondary | ICD-10-CM

## 2018-09-03 DIAGNOSIS — I252 Old myocardial infarction: Secondary | ICD-10-CM

## 2018-09-03 HISTORY — DX: Pulmonary fibrosis, unspecified: J84.10

## 2018-09-03 HISTORY — DX: Diaphragmatic hernia without obstruction or gangrene: K44.9

## 2018-09-03 HISTORY — DX: Essential (primary) hypertension: I10

## 2018-09-03 LAB — BASIC METABOLIC PANEL
Anion gap: 9 (ref 5–15)
BUN: 14 mg/dL (ref 8–23)
CO2: 19 mmol/L — ABNORMAL LOW (ref 22–32)
Calcium: 9.2 mg/dL (ref 8.9–10.3)
Chloride: 112 mmol/L — ABNORMAL HIGH (ref 98–111)
Creatinine, Ser: 0.91 mg/dL (ref 0.44–1.00)
GFR calc Af Amer: >60 mL/min (ref >60)
GFR calc non Af Amer: >60 mL/min (ref >60)
Glucose, Bld: 107 mg/dL — ABNORMAL HIGH (ref 70–99)
Potassium: 3.9 mmol/L (ref 3.5–5.1)
Sodium: 140 mmol/L (ref 135–145)

## 2018-09-03 LAB — HEPATIC FUNCTION PANEL
ALT: 13 U/L (ref 0–44)
AST: 21 U/L (ref 15–41)
Albumin: 3.8 g/dL (ref 3.5–5.0)
Alkaline Phosphatase: 107 U/L (ref 38–126)
Bilirubin, Direct: <0.1 mg/dL (ref 0.0–0.2)
Total Bilirubin: 0.6 mg/dL (ref 0.3–1.2)
Total Protein: 6.8 g/dL (ref 6.5–8.1)

## 2018-09-03 LAB — CBC
HCT: 45 % (ref 36.0–46.0)
Hemoglobin: 14.6 g/dL (ref 12.0–15.0)
MCH: 32.4 pg (ref 26.0–34.0)
MCHC: 32.4 g/dL (ref 30.0–36.0)
MCV: 99.8 fL (ref 80.0–100.0)
Platelets: 321 10*3/uL (ref 150–400)
RBC: 4.51 MIL/uL (ref 3.87–5.11)
RDW: 13.4 % (ref 11.5–15.5)
WBC: 9.1 10*3/uL (ref 4.0–10.5)
nRBC: 0 % (ref 0.0–0.2)

## 2018-09-03 LAB — TROPONIN I (HIGH SENSITIVITY)
Troponin I (High Sensitivity): 3 ng/L (ref <18)
Troponin I (High Sensitivity): 3 ng/L (ref <18)

## 2018-09-03 LAB — SARS CORONAVIRUS 2 BY RT PCR (HOSPITAL ORDER, PERFORMED IN ~~LOC~~ HOSPITAL LAB): SARS Coronavirus 2: NEGATIVE

## 2018-09-03 LAB — LIPASE, BLOOD: Lipase: 28 U/L (ref 11–51)

## 2018-09-03 LAB — GROUP A STREP BY PCR: Group A Strep by PCR: NOT DETECTED

## 2018-09-03 MED ORDER — SODIUM CHLORIDE 0.9 % IV BOLUS
500.0000 mL | Freq: Once | INTRAVENOUS | Status: AC
  Administered 2018-09-03: 500 mL via INTRAVENOUS

## 2018-09-03 MED ORDER — SODIUM CHLORIDE 0.9 % IV BOLUS
1000.0000 mL | Freq: Once | INTRAVENOUS | Status: AC
  Administered 2018-09-04: 01:00:00 1000 mL via INTRAVENOUS

## 2018-09-03 MED ORDER — ONDANSETRON HCL 4 MG/2ML IJ SOLN
4.0000 mg | Freq: Once | INTRAMUSCULAR | Status: AC
  Administered 2018-09-04: 4 mg via INTRAVENOUS
  Filled 2018-09-03: qty 2

## 2018-09-03 MED ORDER — ONDANSETRON HCL 4 MG/2ML IJ SOLN
4.0000 mg | Freq: Once | INTRAMUSCULAR | Status: AC
  Administered 2018-09-03: 4 mg via INTRAVENOUS
  Filled 2018-09-03: qty 2

## 2018-09-03 MED ORDER — HYDROCODONE-ACETAMINOPHEN 7.5-325 MG/15ML PO SOLN
10.0000 mL | Freq: Once | ORAL | Status: AC
  Administered 2018-09-03: 21:00:00 10 mL via ORAL
  Filled 2018-09-03: qty 15

## 2018-09-03 NOTE — ED Notes (Signed)
Two unsuccessful IV attempts.

## 2018-09-03 NOTE — ED Provider Notes (Signed)
N, V, ST, dry cough, substernal chest pain "pinch" Feels weak and lightheaded COVID negative, strep negative Tests reassuring - very orthostatic after 500 cc fluids  Plan: additional fluids (COVID negative) Vomiting continuous - additional Zofran given (11:20) Recheck after fluids - consider admission if no better/can't ambulate  PCP - [facility] (?)  12:40 - Intro and re-check  She is still nauseous with vomiting. Still feels weak. Initial IV infiltrated, new one established. She reports dizziness while at rest, "the room is spinning". Consider Vertigo - will give a dose of Meclizine.  1:45 - Nausea is improved. Resting comfortably. Still complains of dizziness at rest.  3:00 - No change in dizziness. Nausea returning - no vomiting. IVF bolus almost completed. Plan to recheck orthostatics.   4:15 - orthostatic measures improved but still abnormal. The patient endorses worsening dizziness with standing. Nausea returns, vomiting x 1. Ativan ordered. Will admit for further treatment.   Charlann Lange, PA-C 09/04/18 1735    Malvin Johns, MD 09/06/18 [phone]

## 2018-09-03 NOTE — ED Notes (Addendum)
Pt was unable to ambulate with pulse ox upon standing patient stated she was dizzy took and step and said she was really dizzy pt returned to bed M.D. notified.

## 2018-09-03 NOTE — ED Triage Notes (Addendum)
Pt arrives POV for eval of N/V, cough, general malaise, weakness and intermittent chest pains. Denies known covid + contacts.

## 2018-09-03 NOTE — ED Notes (Signed)
Called lab to add on lipase and hepatic fx

## 2018-09-04 ENCOUNTER — Encounter (HOSPITAL_COMMUNITY): Payer: Self-pay | Admitting: Internal Medicine

## 2018-09-04 ENCOUNTER — Observation Stay (HOSPITAL_COMMUNITY): Payer: Medicare Other

## 2018-09-04 DIAGNOSIS — R1115 Cyclical vomiting syndrome unrelated to migraine: Secondary | ICD-10-CM | POA: Diagnosis present

## 2018-09-04 DIAGNOSIS — Z20828 Contact with and (suspected) exposure to other viral communicable diseases: Secondary | ICD-10-CM | POA: Diagnosis present

## 2018-09-04 DIAGNOSIS — Z8711 Personal history of peptic ulcer disease: Secondary | ICD-10-CM | POA: Diagnosis not present

## 2018-09-04 DIAGNOSIS — B20 Human immunodeficiency virus [HIV] disease: Secondary | ICD-10-CM

## 2018-09-04 DIAGNOSIS — I10 Essential (primary) hypertension: Secondary | ICD-10-CM | POA: Diagnosis present

## 2018-09-04 DIAGNOSIS — J45909 Unspecified asthma, uncomplicated: Secondary | ICD-10-CM | POA: Diagnosis present

## 2018-09-04 DIAGNOSIS — J8410 Pulmonary fibrosis, unspecified: Secondary | ICD-10-CM | POA: Diagnosis present

## 2018-09-04 DIAGNOSIS — N390 Urinary tract infection, site not specified: Secondary | ICD-10-CM | POA: Diagnosis present

## 2018-09-04 DIAGNOSIS — Z833 Family history of diabetes mellitus: Secondary | ICD-10-CM | POA: Diagnosis not present

## 2018-09-04 DIAGNOSIS — I252 Old myocardial infarction: Secondary | ICD-10-CM | POA: Diagnosis not present

## 2018-09-04 DIAGNOSIS — Z823 Family history of stroke: Secondary | ICD-10-CM | POA: Diagnosis not present

## 2018-09-04 DIAGNOSIS — B9620 Unspecified Escherichia coli [E. coli] as the cause of diseases classified elsewhere: Secondary | ICD-10-CM | POA: Diagnosis present

## 2018-09-04 DIAGNOSIS — Z8249 Family history of ischemic heart disease and other diseases of the circulatory system: Secondary | ICD-10-CM | POA: Diagnosis not present

## 2018-09-04 DIAGNOSIS — J84112 Idiopathic pulmonary fibrosis: Secondary | ICD-10-CM | POA: Diagnosis present

## 2018-09-04 DIAGNOSIS — Z955 Presence of coronary angioplasty implant and graft: Secondary | ICD-10-CM | POA: Diagnosis not present

## 2018-09-04 DIAGNOSIS — R112 Nausea with vomiting, unspecified: Secondary | ICD-10-CM | POA: Diagnosis present

## 2018-09-04 DIAGNOSIS — I2510 Atherosclerotic heart disease of native coronary artery without angina pectoris: Secondary | ICD-10-CM | POA: Diagnosis present

## 2018-09-04 DIAGNOSIS — E872 Acidosis: Secondary | ICD-10-CM | POA: Diagnosis present

## 2018-09-04 DIAGNOSIS — R0789 Other chest pain: Secondary | ICD-10-CM

## 2018-09-04 LAB — RAPID URINE DRUG SCREEN, HOSP PERFORMED
Amphetamines: NOT DETECTED
Barbiturates: NOT DETECTED
Benzodiazepines: NOT DETECTED
Cocaine: NOT DETECTED
Opiates: POSITIVE — AB
Tetrahydrocannabinol: NOT DETECTED

## 2018-09-04 LAB — BASIC METABOLIC PANEL
Anion gap: 8 (ref 5–15)
BUN: 12 mg/dL (ref 8–23)
CO2: 19 mmol/L — ABNORMAL LOW (ref 22–32)
Calcium: 8.2 mg/dL — ABNORMAL LOW (ref 8.9–10.3)
Chloride: 115 mmol/L — ABNORMAL HIGH (ref 98–111)
Creatinine, Ser: 0.77 mg/dL (ref 0.44–1.00)
GFR calc Af Amer: >60 mL/min (ref >60)
GFR calc non Af Amer: >60 mL/min (ref >60)
Glucose, Bld: 93 mg/dL (ref 70–99)
Potassium: 3.8 mmol/L (ref 3.5–5.1)
Sodium: 142 mmol/L (ref 135–145)

## 2018-09-04 LAB — CBC WITH DIFFERENTIAL/PLATELET
Abs Immature Granulocytes: 0.05 10*3/uL (ref 0.00–0.07)
Basophils Absolute: 0 10*3/uL (ref 0.0–0.1)
Basophils Relative: 0 %
Eosinophils Absolute: 0.1 10*3/uL (ref 0.0–0.5)
Eosinophils Relative: 1 %
HCT: 39.4 % (ref 36.0–46.0)
Hemoglobin: 12.4 g/dL (ref 12.0–15.0)
Immature Granulocytes: 1 %
Lymphocytes Relative: 25 %
Lymphs Abs: 1.7 10*3/uL (ref 0.7–4.0)
MCH: 32.5 pg (ref 26.0–34.0)
MCHC: 31.5 g/dL (ref 30.0–36.0)
MCV: 103.4 fL — ABNORMAL HIGH (ref 80.0–100.0)
Monocytes Absolute: 0.8 10*3/uL (ref 0.1–1.0)
Monocytes Relative: 12 %
Neutro Abs: 4.1 10*3/uL (ref 1.7–7.7)
Neutrophils Relative %: 61 %
Platelets: 234 10*3/uL (ref 150–400)
RBC: 3.81 MIL/uL — ABNORMAL LOW (ref 3.87–5.11)
RDW: 13.5 % (ref 11.5–15.5)
WBC: 6.7 10*3/uL (ref 4.0–10.5)
nRBC: 0 % (ref 0.0–0.2)

## 2018-09-04 LAB — URINALYSIS, ROUTINE W REFLEX MICROSCOPIC
Bilirubin Urine: NEGATIVE
Glucose, UA: NEGATIVE mg/dL
Hgb urine dipstick: NEGATIVE
Ketones, ur: NEGATIVE mg/dL
Leukocytes,Ua: NEGATIVE
Nitrite: POSITIVE — AB
Protein, ur: NEGATIVE mg/dL
Specific Gravity, Urine: >1.03 — ABNORMAL HIGH (ref 1.005–1.030)
pH: 5.5 (ref 5.0–8.0)

## 2018-09-04 LAB — URINALYSIS, MICROSCOPIC (REFLEX)

## 2018-09-04 LAB — HEPATIC FUNCTION PANEL
ALT: 12 U/L (ref 0–44)
AST: 16 U/L (ref 15–41)
Albumin: 2.9 g/dL — ABNORMAL LOW (ref 3.5–5.0)
Alkaline Phosphatase: 83 U/L (ref 38–126)
Bilirubin, Direct: <0.1 mg/dL (ref 0.0–0.2)
Total Bilirubin: 0.5 mg/dL (ref 0.3–1.2)
Total Protein: 5.4 g/dL — ABNORMAL LOW (ref 6.5–8.1)

## 2018-09-04 MED ORDER — PIRFENIDONE 267 MG PO CAPS
534.0000 mg | ORAL_CAPSULE | Freq: Two times a day (BID) | ORAL | Status: DC
  Administered 2018-09-04 – 2018-09-05 (×2): 534 mg via ORAL
  Filled 2018-09-04 (×2): qty 2

## 2018-09-04 MED ORDER — ENOXAPARIN SODIUM 40 MG/0.4ML ~~LOC~~ SOLN
40.0000 mg | SUBCUTANEOUS | Status: DC
  Administered 2018-09-04 – 2018-09-05 (×2): 40 mg via SUBCUTANEOUS
  Filled 2018-09-04 (×2): qty 0.4

## 2018-09-04 MED ORDER — ONDANSETRON HCL 4 MG PO TABS: 4.0000 mg | ORAL_TABLET | Freq: Four times a day (QID) | ORAL | Status: DC | PRN

## 2018-09-04 MED ORDER — LORAZEPAM 2 MG/ML IJ SOLN
1.0000 mg | Freq: Once | INTRAMUSCULAR | Status: AC
  Administered 2018-09-04: 05:00:00 1 mg via INTRAVENOUS
  Filled 2018-09-04: qty 1

## 2018-09-04 MED ORDER — ACETAMINOPHEN 650 MG RE SUPP: 650.0000 mg | Freq: Four times a day (QID) | RECTAL | Status: DC | PRN

## 2018-09-04 MED ORDER — ONDANSETRON HCL 4 MG/2ML IJ SOLN
4.0000 mg | Freq: Four times a day (QID) | INTRAMUSCULAR | Status: DC
  Administered 2018-09-04 – 2018-09-05 (×4): 4 mg via INTRAVENOUS
  Filled 2018-09-04 (×4): qty 2

## 2018-09-04 MED ORDER — ENOXAPARIN SODIUM 40 MG/0.4ML ~~LOC~~ SOLN: 40.0000 mg | SUBCUTANEOUS | Status: DC

## 2018-09-04 MED ORDER — CLOPIDOGREL BISULFATE 75 MG PO TABS
75.0000 mg | ORAL_TABLET | Freq: Every day | ORAL | Status: DC
  Administered 2018-09-04 – 2018-09-05 (×2): 75 mg via ORAL
  Filled 2018-09-04 (×2): qty 1

## 2018-09-04 MED ORDER — MOMETASONE FURO-FORMOTEROL FUM 200-5 MCG/ACT IN AERO
2.0000 | INHALATION_SPRAY | Freq: Two times a day (BID) | RESPIRATORY_TRACT | Status: DC
  Administered 2018-09-04 – 2018-09-05 (×2): 2 via RESPIRATORY_TRACT
  Filled 2018-09-04: qty 8.8

## 2018-09-04 MED ORDER — LAMIVUDINE 150 MG PO TABS
300.0000 mg | ORAL_TABLET | Freq: Every day | ORAL | Status: DC
  Administered 2018-09-04 – 2018-09-05 (×2): 300 mg via ORAL
  Filled 2018-09-04 (×2): qty 2

## 2018-09-04 MED ORDER — PIRFENIDONE 267 MG PO CAPS
534.0000 mg | ORAL_CAPSULE | Freq: Two times a day (BID) | ORAL | Status: DC
  Administered 2018-09-04: 534 mg via ORAL

## 2018-09-04 MED ORDER — ASPIRIN EC 81 MG PO TBEC
81.0000 mg | DELAYED_RELEASE_TABLET | Freq: Every day | ORAL | Status: DC
  Administered 2018-09-04 – 2018-09-05 (×2): 81 mg via ORAL
  Filled 2018-09-04 (×2): qty 1

## 2018-09-04 MED ORDER — SODIUM CHLORIDE 0.9 % IV SOLN: INTRAVENOUS | Status: AC

## 2018-09-04 MED ORDER — AMITRIPTYLINE HCL 25 MG PO TABS
50.0000 mg | ORAL_TABLET | Freq: Every day | ORAL | Status: DC
  Administered 2018-09-04: 50 mg via ORAL
  Filled 2018-09-04: qty 2

## 2018-09-04 MED ORDER — ACETAMINOPHEN 325 MG PO TABS: 650.0000 mg | ORAL_TABLET | Freq: Four times a day (QID) | ORAL | Status: DC | PRN

## 2018-09-04 MED ORDER — DM-GUAIFENESIN ER 30-600 MG PO TB12
1.0000 | ORAL_TABLET | Freq: Two times a day (BID) | ORAL | Status: DC | PRN
  Filled 2018-09-04: qty 1

## 2018-09-04 MED ORDER — MECLIZINE HCL 25 MG PO TABS
25.0000 mg | ORAL_TABLET | Freq: Once | ORAL | Status: AC
  Administered 2018-09-04: 02:00:00 25 mg via ORAL
  Filled 2018-09-04: qty 1

## 2018-09-04 MED ORDER — DOLUTEGRAVIR-LAMIVUDINE 50-300 MG PO TABS: 1.0000 | ORAL_TABLET | Freq: Every day | ORAL | Status: DC

## 2018-09-04 MED ORDER — SODIUM CHLORIDE 0.9 % IV SOLN
1.0000 g | INTRAVENOUS | Status: DC
  Administered 2018-09-04: 1 g via INTRAVENOUS
  Filled 2018-09-04: qty 10
  Filled 2018-09-04: qty 1

## 2018-09-04 MED ORDER — PANTOPRAZOLE SODIUM 40 MG PO TBEC
40.0000 mg | DELAYED_RELEASE_TABLET | Freq: Every day | ORAL | Status: DC
  Administered 2018-09-04 – 2018-09-05 (×2): 40 mg via ORAL
  Filled 2018-09-04 (×2): qty 1

## 2018-09-04 MED ORDER — DOLUTEGRAVIR SODIUM 50 MG PO TABS
50.0000 mg | ORAL_TABLET | Freq: Every day | ORAL | Status: DC
  Administered 2018-09-04 – 2018-09-05 (×2): 50 mg via ORAL
  Filled 2018-09-04 (×2): qty 1

## 2018-09-04 MED ORDER — METOPROLOL TARTRATE 25 MG PO TABS
12.5000 mg | ORAL_TABLET | Freq: Two times a day (BID) | ORAL | Status: DC
  Administered 2018-09-04 – 2018-09-05 (×3): 12.5 mg via ORAL
  Filled 2018-09-04 (×3): qty 1

## 2018-09-04 MED ORDER — ONDANSETRON HCL 4 MG/2ML IJ SOLN: 4.0000 mg | Freq: Four times a day (QID) | INTRAMUSCULAR | Status: DC | PRN

## 2018-09-04 MED ORDER — POLYETHYLENE GLYCOL 3350 17 G PO PACK: 17.0000 g | PACK | Freq: Every day | ORAL | Status: DC | PRN

## 2018-09-04 MED ORDER — NITROGLYCERIN 0.4 MG SL SUBL: 0.4000 mg | SUBLINGUAL_TABLET | SUBLINGUAL | Status: DC | PRN

## 2018-09-04 MED ORDER — ALBUTEROL SULFATE (2.5 MG/3ML) 0.083% IN NEBU: 3.0000 mL | INHALATION_SOLUTION | RESPIRATORY_TRACT | Status: DC | PRN

## 2018-09-04 MED ORDER — ATORVASTATIN CALCIUM 40 MG PO TABS
40.0000 mg | ORAL_TABLET | Freq: Every day | ORAL | Status: DC
  Administered 2018-09-04: 19:00:00 40 mg via ORAL
  Filled 2018-09-04: qty 1

## 2018-09-04 MED ORDER — SODIUM CHLORIDE 0.9 % IV SOLN
Freq: Once | INTRAVENOUS | Status: AC
  Administered 2018-09-04: 06:00:00 via INTRAVENOUS

## 2018-09-04 NOTE — Evaluation (Signed)
Physical Therapy Evaluation Patient Details Name: [name] [name] MRN: [id number] DOB: 10/29/1950 Today's Date: 09/04/2018   History of Present Illness  Pt is a 68 y/o female admitted secondary to nausea, vomiting and dizziness. UA consistent with UTI. CT negative for acute abnormality. PMH includes CAD, HIV, pulmonary fibrosis, and asthma.   Clinical Impression  Pt admitted secondary to problem above with deficits below. Pt limited this session secondary to dizziness. Pt unable to tolerate standing for long enough to get standing BP reading. However, BP was stable when moving from supine to sitting (see vitals flowsheet). Anticipate pt will progress well once dizziness improves. Will continue to follow acutely to maximize functional mobility independence and safety.     Follow Up Recommendations Home health PT;Supervision for mobility/OOB(pending progression )    Equipment Recommendations  None recommended by PT    Recommendations for Other Services OT consult     Precautions / Restrictions Precautions Precautions: Fall Restrictions Weight Bearing Restrictions: No      Mobility  Bed Mobility Overal bed mobility: Needs Assistance Bed Mobility: Supine to Sit;Sit to Supine     Supine to sit: Supervision Sit to supine: Supervision   General bed mobility comments: Supervision for safety. Pt reports increased dizziness in sitting, however, BP remained stable.   Transfers Overall transfer level: Needs assistance Equipment used: Rolling walker (2 wheeled) Transfers: Sit to/from Stand Sit to Stand: Min guard         General transfer comment: Min guard for safety. Pt only able to tolerate brief period in standing and unable to check BP secondary to dizziness. Returned to sitting.   Ambulation/Gait             General Gait Details: NT secondary to dizziness.   Stairs            Wheelchair Mobility    Modified Rankin (Stroke Patients Only)       Balance  Overall balance assessment: Needs assistance Sitting-balance support: No upper extremity supported;Feet supported Sitting balance-Leahy Scale: Good     Standing balance support: Bilateral upper extremity supported;During functional activity Standing balance-Leahy Scale: Poor Standing balance comment: Reliant on BUE support                              Pertinent Vitals/Pain Pain Assessment: No/denies pain    Home Living Family/patient expects to be discharged to:: Private residence Living Arrangements: Children Available Help at Discharge: Family;Available 24 hours/day Type of Home: Apartment Home Access: Stairs to enter Entrance Stairs-Rails: Right Entrance Stairs-Number of Steps: flight Home Layout: One level Home Equipment: Walker - 2 wheels;Cane - single point      Prior Function Level of Independence: Independent               Hand Dominance        Extremity/Trunk Assessment   Upper Extremity Assessment Upper Extremity Assessment: Defer to OT evaluation    Lower Extremity Assessment Lower Extremity Assessment: Generalized weakness    Cervical / Trunk Assessment Cervical / Trunk Assessment: Normal  Communication   Communication: No difficulties  Cognition Arousal/Alertness: Awake/alert Behavior During Therapy: WFL for tasks assessed/performed Overall Cognitive Status: Within Functional Limits for tasks assessed                                        General Comments General  comments (skin integrity, edema, etc.): See vitals flowsheet for BP readings    Exercises     Assessment/Plan    PT Assessment Patient needs continued PT services  PT Problem List Decreased strength;Decreased activity tolerance;Decreased balance;Decreased mobility;Decreased knowledge of use of DME;Decreased knowledge of precautions       PT Treatment Interventions DME instruction;Gait training;Functional mobility training;Stair  training;Therapeutic exercise;Therapeutic activities;Balance training;Patient/family education    PT Goals (Current goals can be found in the Care Plan section)  Acute Rehab PT Goals Patient Stated Goal: to feel better PT Goal Formulation: With patient Time For Goal Achievement: 09/18/18 Potential to Achieve Goals: Good    Frequency Min 3X/week   Barriers to discharge        Co-evaluation               AM-PAC PT "6 Clicks" Mobility  Outcome Measure Help needed turning from your back to your side while in a flat bed without using bedrails?: None Help needed moving from lying on your back to sitting on the side of a flat bed without using bedrails?: A Little Help needed moving to and from a bed to a chair (including a wheelchair)?: A Little Help needed standing up from a chair using your arms (e.g., wheelchair or bedside chair)?: A Little Help needed to walk in hospital room?: A Lot Help needed climbing 3-5 steps with a railing? : A Lot 6 Click Score: 17    End of Session Equipment Utilized During Treatment: Gait belt Activity Tolerance: Treatment limited secondary to medical complications (Comment)(dizziness ) Patient left: in bed;with call bell/phone within reach;with bed alarm set Nurse Communication: Mobility status;Other (comment)(dizziness ) PT Visit Diagnosis: Difficulty in walking, not elsewhere classified (R26.2);Dizziness and giddiness (R42)    Time: 5009-3818 PT Time Calculation (min) (ACUTE ONLY): 16 min   Charges:   PT Evaluation $PT Eval Moderate Complexity: Benedict, PT, DPT  Acute Rehabilitation Services  Pager: [phone] Office: [phone]   Rudean Hitt 09/04/2018, 6:27 PM

## 2018-09-04 NOTE — ED Notes (Signed)
Pt.is very hard stick will ask some one else

## 2018-09-04 NOTE — ED Notes (Signed)
Pt. reports increased dizziness changing positions from flat to seated and going from seated to standing

## 2018-09-04 NOTE — Progress Notes (Signed)
Patient received to room 6C08.  Patient is A&O.  Offers no c/o pain at this time.  Denies nausea / dizziness.  U/A positive for UTI.  Bed placed in lowest position.  Call bell within reach / patient oriented to unit staff and to routine.

## 2018-09-04 NOTE — ED Notes (Signed)
Patient transported to CT 

## 2018-09-04 NOTE — H&P (Signed)
History and Physical    SHAWNIKA PEPIN VOZ:[id number] DOB: [birth year] DOA: 09/03/2018  PCP: System, Pcp Not In  Patient coming from: Home.  Chief Complaint: Nausea vomiting dizziness.  HPI: [name] [name] is a 68 y.o. female with history of HIV, pulmonary fibrosis, CAD, hypertension, asthma presents to the ER because of persistent nausea vomiting with dizziness.  Patient denies any headache fever chills diarrhea.  Has chronic shortness of breath from pulmonary fibrosis.  Denies any blood in the vomitus or any sick contacts.  Lives with her daughter and nobody sick at home.  Patient states that her vomiting is caused some chest pain which happens only on vomiting.  Was able to keep her medication in.  Dizziness happens on standing.  ED Course: In the ER patient had persistent vomiting.  Appeared nonfocal.  Dizziness happened on standing.  Was orthostatic.  Was given fluids.  Eventually Ativan was given with following which nausea improved.  CT head unremarkable.  Labs show normal LFTs and non-anion gap metabolic acidosis.  UA and urine drug screen are pending.  Patient was given fluids antiemetics and admitted for further management of nausea vomiting dizziness.  EKG shows normal sinus rhythm nonspecific changes.  High-sensitivity troponin being negative.  Review of Systems: As per HPI, rest all negative.   Past Medical History:  Diagnosis Date  . Asthma   . Bradycardia    Sinus bradycardia  . CAD (coronary artery disease)    Minimal by history, Question coronary artery spasm  . Colon polyps   . Dyslipidemia   . Ejection fraction   . GERD (gastroesophageal reflux disease)    Esophageal stricture  . H/O: hysterectomy   . HIV positive ([place])    Needle stick 1993  . Hyperlipidemia   . Palpitations   . Peptic ulcer disease   . Shortness of breath    Extensive evaluation Dr. Winona Legato, [facility], May, 4259, etiology uncertain, probable bronchiolitis possibility of lung biopsy at that time  .  Weight loss     Past Surgical History:  Procedure Laterality Date  . APPENDECTOMY    . CARDIAC CATHETERIZATION N/A 03/21/2016   Procedure: Right/Left Heart Cath and Coronary Angiography;  Surgeon: Belva Crome, MD;  Location: [facility];  Service: Cardiovascular;  Laterality: N/A;  . CHOLECYSTECTOMY    . GASTRECTOMY    . VENTRAL HERNIA REPAIR       reports that she quit smoking about 22 years ago. She has never used smokeless tobacco. She reports that she does not drink alcohol or use drugs.  Allergies  Allergen Reactions  . Isosorbide Nitrate Other (See Comments)    Headaches  . Sulfamethoxazole-Trimethoprim Hives and Rash    Family History  Problem Relation Age of Onset  . Coronary artery disease Other        family hx of on mother, father and siblings  . Bradycardia Sister   . Diabetes Mother   . Heart attack Mother   . Heart disease Mother   . Hypertension Mother   . Stroke Mother   . Bradycardia Brother     Prior to Admission medications   Medication Sig Start Date End Date Taking? Authorizing Provider  albuterol (PROVENTIL HFA;VENTOLIN HFA) 108 (90 Base) MCG/ACT inhaler Inhale 2 puffs into the lungs every 4 (four) hours as needed for wheezing or shortness of breath.  01/13/16   [provider]  amitriptyline (ELAVIL) 50 MG tablet Take 50 mg by mouth at bedtime.  [provider]  aspirin EC 81 MG tablet Take 1 tablet (81 mg total) by mouth daily. 03/17/16   Dorothy Spark, MD  atorvastatin (LIPITOR) 40 MG tablet Take 40 mg by mouth daily at 6 PM.     [provider]  budesonide-formoterol (SYMBICORT) 160-4.5 MCG/ACT inhaler Inhale 2 puffs into the lungs 2 (two) times daily.     [provider]  clopidogrel (PLAVIX) 75 MG tablet Take 75 mg by mouth daily.    [provider]  conjugated estrogens (PREMARIN) vaginal cream Place 8.85 Applicatorfuls vaginally every Wednesday. Pt only to use 1/4 of the applicator      [provider]  dextromethorphan-guaiFENesin (MUCINEX DM) 30-600 MG 12hr tablet Take 1 tablet by mouth 2 (two) times daily as needed for cough. 04/24/18   Aline August, MD  docusate sodium (COLACE) 100 MG capsule Take 100 mg by mouth daily as needed for mild constipation.  05/05/15   [provider]  Dolutegravir-lamiVUDine (DOVATO) 50-300 MG TABS Take 1 tablet by mouth daily.    [provider]  isosorbide mononitrate (IMDUR) 30 MG 24 hr tablet Take 1 tablet (30 mg total) by mouth daily. Patient not taking: Reported on 02/25/2018 03/21/16   Belva Crome, MD  metoprolol tartrate (LOPRESSOR) 25 MG tablet Take 12.5 mg by mouth 2 (two) times daily. 12/19/17   [provider]  nitroGLYCERIN (NITROSTAT) 0.4 MG SL tablet Place 1 tablet (0.4 mg total) under the tongue every 5 (five) minutes as needed for chest pain. 03/21/16   Belva Crome, MD  omeprazole (PRILOSEC) 40 MG capsule Take 40 mg by mouth 2 (two) times daily. 04/18/18   [provider]  Pirfenidone (ESBRIET) 267 MG CAPS Take 534 mg by mouth 2 (two) times daily.     [provider]  polyethylene glycol (MIRALAX / GLYCOLAX) packet Take 17 g by mouth daily as needed (constipation).     [provider]    Physical Exam: Vitals:   09/04/18 0400 09/04/18 0415 09/04/18 0430 09/04/18 0609  BP: 140/79  (!) 161/97 118/86  Pulse: 77 71 87 74  Resp: 13 15 18 19   Temp:      TempSrc:      SpO2: 96% 97% 98% 97%  Weight:      Height:          Constitutional: Moderately built and nourished. Vitals:   09/04/18 0400 09/04/18 0415 09/04/18 0430 09/04/18 0609  BP: 140/79  (!) 161/97 118/86  Pulse: 77 71 87 74  Resp: 13 15 18 19   Temp:      TempSrc:      SpO2: 96% 97% 98% 97%  Weight:      Height:       Eyes: Anicteric no pallor. ENMT: No discharge from the ears eyes nose or mouth. Neck: No mass felt.  No neck rigidity but no JVD appreciated. Respiratory: No rhonchi or  crepitations. Cardiovascular: S1-S2 heard. Abdomen: Soft nontender bowel sounds present. Musculoskeletal: No edema.  No joint effusion. Skin: No rash. Neurologic: Alert awake oriented to time place and person.  Moves all extremities 5 x 5.  No facial asymmetry no nystagmus. Psychiatric: Appears normal.   Labs on Admission: I have personally reviewed following labs and imaging studies  CBC: Recent Labs  Lab 09/03/18 1752  WBC 9.1  HGB 14.6  HCT 45.0  MCV 99.8  PLT 027   Basic Metabolic Panel: Recent Labs  Lab 09/03/18 1752  NA  140  K 3.9  CL 112*  CO2 19*  GLUCOSE 107*  BUN 14  CREATININE 0.91  CALCIUM 9.2   GFR: Estimated Creatinine Clearance: 50.3 mL/min (by C-G formula based on SCr of 0.91 mg/dL). Liver Function Tests: Recent Labs  Lab 09/03/18 2052  AST 21  ALT 13  ALKPHOS 107  BILITOT 0.6  PROT 6.8  ALBUMIN 3.8   Recent Labs  Lab 09/03/18 2052  LIPASE 28   No results for input(s): AMMONIA in the last 168 hours. Coagulation Profile: No results for input(s): INR, PROTIME in the last 168 hours. Cardiac Enzymes: No results for input(s): CKTOTAL, CKMB, CKMBINDEX, TROPONINI in the last 168 hours. BNP (last 3 results) No results for input(s): PROBNP in the last 8760 hours. HbA1C: No results for input(s): HGBA1C in the last 72 hours. CBG: No results for input(s): GLUCAP in the last 168 hours. Lipid Profile: No results for input(s): CHOL, HDL, LDLCALC, TRIG, CHOLHDL, LDLDIRECT in the last 72 hours. Thyroid Function Tests: No results for input(s): TSH, T4TOTAL, FREET4, T3FREE, THYROIDAB in the last 72 hours. Anemia Panel: No results for input(s): VITAMINB12, FOLATE, FERRITIN, TIBC, IRON, RETICCTPCT in the last 72 hours. Urine analysis:    Component Value Date/Time   COLORURINE YELLOW 04/22/2018 0029   APPEARANCEUR CLEAR 04/22/2018 0029   LABSPEC 1.016 04/22/2018 0029   PHURINE 6.0 04/22/2018 0029   GLUCOSEU NEGATIVE 04/22/2018 0029   HGBUR SMALL  (A) 04/22/2018 0029   BILIRUBINUR NEGATIVE 04/22/2018 0029   KETONESUR NEGATIVE 04/22/2018 0029   PROTEINUR NEGATIVE 04/22/2018 0029   UROBILINOGEN 4.0 (H) 07/29/2009 0740   NITRITE NEGATIVE 04/22/2018 0029   LEUKOCYTESUR NEGATIVE 04/22/2018 0029   Sepsis Labs: @LABRCNTIP (procalcitonin:4,lacticidven:4) ) Recent Results (from the past 240 hour(s))  Group A Strep by PCR     Status: None   Collection Time: 09/03/18  8:52 PM   Specimen: Throat; Sterile Swab  Result Value Ref Range Status   Group A Strep by PCR NOT DETECTED NOT DETECTED Final    Comment: Performed at [facility], [address], [place],  [address]  SARS Coronavirus 2 (CEPHEID- Performed in [place] hospital lab), Hosp Order     Status: None   Collection Time: 09/03/18  8:55 PM   Specimen: Throat; Nasopharyngeal  Result Value Ref Range Status   SARS Coronavirus 2 NEGATIVE NEGATIVE Final    Comment: (NOTE) If result is NEGATIVE SARS-CoV-2 target nucleic acids are NOT DETECTED. The SARS-CoV-2 RNA is generally detectable in upper and lower  respiratory specimens during the acute phase of infection. The lowest  concentration of SARS-CoV-2 viral copies this assay can detect is 250  copies / mL. A negative result does not preclude SARS-CoV-2 infection  and should not be used as the sole basis for treatment or other  patient management decisions.  A negative result may occur with  improper specimen collection / handling, submission of specimen other  than nasopharyngeal swab, presence of viral mutation(s) within the  areas targeted by this assay, and inadequate number of viral copies  (<250 copies / mL). A negative result must be combined with clinical  observations, patient history, and epidemiological information. If result is POSITIVE SARS-CoV-2 target nucleic acids are DETECTED. The SARS-CoV-2 RNA is generally detectable in upper and lower  respiratory specimens dur ing the acute phase of infection.   Positive  results are indicative of active infection with SARS-CoV-2.  Clinical  correlation with patient history and other diagnostic information is  necessary to determine patient infection status.  Positive results do  not rule out bacterial infection or co-infection with other viruses. If result is PRESUMPTIVE POSTIVE SARS-CoV-2 nucleic acids MAY BE PRESENT.   A presumptive positive result was obtained on the submitted specimen  and confirmed on repeat testing.  While 2019 novel coronavirus  (SARS-CoV-2) nucleic acids may be present in the submitted sample  additional confirmatory testing may be necessary for epidemiological  and / or clinical management purposes  to differentiate between  SARS-CoV-2 and other Sarbecovirus currently known to infect humans.  If clinically indicated additional testing with an alternate test  methodology [phone]) is advised. The SARS-CoV-2 RNA is generally  detectable in upper and lower respiratory sp ecimens during the acute  phase of infection. The expected result is Negative. Fact Sheet for Patients:  StrictlyIdeas.no Fact Sheet for Healthcare Providers: BankingDealers.co.za This test is not yet approved or cleared by the [place] FDA and has been authorized for detection and/or diagnosis of SARS-CoV-2 by FDA under an Emergency Use Authorization (EUA).  This EUA will remain in effect (meaning this test can be used) for the duration of the COVID-19 declaration under Section 564(b)(1) of the Act, 21 U.S.C. section 360bbb-3(b)(1), unless the authorization is terminated or revoked sooner. Performed at [facility], [place] [address], [place], [place] [address]      Radiological Exams on Admission: Dg Chest 2 View  Result Date: 09/03/2018 CLINICAL DATA:  Shortness of breath. EXAM: CHEST - 2 VIEW COMPARISON:  04/21/2018 FINDINGS: Coarse lung markings are suggestive for chronic changes. No focal  airspace disease or pulmonary edema. Heart and mediastinum are within normal limits. Patient has a coronary artery stent. Trachea is midline. No large pleural effusions. Multiple surgical changes in the upper abdomen. IMPRESSION: No active cardiopulmonary disease. Electronically Signed   By: Markus Daft M.D.   On: 09/03/2018 18:11   Ct Head Wo Contrast  Result Date: 09/04/2018 CLINICAL DATA:  Persistent nausea and vomiting EXAM: CT HEAD WITHOUT CONTRAST TECHNIQUE: Contiguous axial images were obtained from the base of the skull through the vertex without intravenous contrast. COMPARISON:  02/19/2011 FINDINGS: Brain: No evidence of acute infarction, hemorrhage, hydrocephalus, extra-axial collection or mass lesion/mass effect. Mild patchy low-density in the cerebral white matter attributed to chronic microvascular ischemia given age and vascular risk factors. Vascular: No hyperdense vessel or unexpected calcification. Skull: Normal. Negative for fracture or focal lesion. Sinuses/Orbits: No acute finding. IMPRESSION: Negative head CT. Electronically Signed   By: Monte Fantasia M.D.   On: 09/04/2018 06:17    EKG: Independently reviewed.  Normal sinus rhythm nonspecific ST changes.  Assessment/Plan Principal Problem:   Nausea & vomiting Active Problems:   CAD (coronary artery disease)   Human immunodeficiency virus (HIV) disease (HCC)   IPF (idiopathic pulmonary fibrosis) (HCC)   Atypical chest pain    1. Intractable nausea vomiting -abdominal exam appears benign patient is still nauseous.  LFTs lipase are normal.  UA is pending along with urine drug screen.  Due to persistent symptoms CAT scan has been ordered. 2. Dizziness likely from orthostatic changes.  Has received fluids.  Recheck orthostatic.  Physical therapy. 3. HIV -on antiretroviral.  As per the patient CD4 counts were more than 300 6 months ago and viral load was undetectable. 4. Pulmonary fibrosis on pirfenidone. 5. History of asthma  not actively wheezing continue home inhalers. 6. CAD with with atypical chest pain at this time only happening on vomiting.  EKG was  showing nonspecific changes high-sensitivity troponin was negative.  Continue home medications.   DVT prophylaxis: Lovenox. Code Status: Full code. Family Communication: Discussed with patient. Disposition Plan: Home. Consults called: Physical therapy. Admission status: Observation.   Rise Patience MD [facility] Pager [phone].  If 7PM-7AM, please contact night-coverage [email or web address] Password [facility]  09/04/2018, 7:21 AM

## 2018-09-04 NOTE — ED Provider Notes (Signed)
[facility] EMERGENCY DEPARTMENT Provider Note   CSN: [id number] Arrival date & time: 09/03/18  1708    History   Chief Complaint Chief Complaint  Patient presents with  . Cough  . Emesis    HPI [name] [name] is a 68 y.o. female with h/o HIV well controlled on Dovato, CAD s/p MI and stents, pulmonary fibrosis, chronic DOE presents to ER for evaluation of nausea associated with nbnb emesis since Friday. Last time she ate was Friday before symptoms began. Has had associated positional light headedness, epigastric/substernal chest pain described as a "pinch" that is constant since Friday and worse during active emesis, sore throat, rhinorrhea, dry cough. She has taken nyquil without relief.  Reports she has chronic diffuse chest pain and shortness of breath that she attributes to heart attack and fibrosis, but her chest pain today is different because if is sharp and central, constant. No sick contacts. Has been self isolating at home with exception to grocery store trips.  No recent travel. No recent exposure to suspicious foods. Highest temp 99.9 orally.  No fever, headaches, body aches, hematemesis, diarrhea, constipation, blood in stool, dysuria.      HPI  Past Medical History:  Diagnosis Date  . Asthma   . Bradycardia    Sinus bradycardia  . CAD (coronary artery disease)    Minimal by history, Question coronary artery spasm  . Colon polyps   . Dyslipidemia   . Ejection fraction   . GERD (gastroesophageal reflux disease)    Esophageal stricture  . H/O: hysterectomy   . HIV positive ([place])    Needle stick 1993  . Hyperlipidemia   . Palpitations   . Peptic ulcer disease   . Shortness of breath    Extensive evaluation Dr. Winona Legato, [facility], May, 4098, etiology uncertain, probable bronchiolitis possibility of lung biopsy at that time  . Weight loss     Patient Active Problem List   Diagnosis Date Noted  . Influenza due to identified novel influenza A  virus with other respiratory manifestations 04/22/2018  . Hypokalemia 04/22/2018  . HIV (human immunodeficiency virus infection) ([place]) 04/21/2018  . Sepsis ([place]) 04/21/2018  . Abdominal pain 04/21/2018  . Asthma exacerbation 04/21/2018  . Anxiety state 03/17/2016  . Benign neoplasm of colon 03/17/2016  . Human immunodeficiency virus (HIV) disease ([place]) 03/17/2016  . Nontoxic uninodular goiter 03/17/2016  . Other and unspecified hyperlipidemia 03/17/2016  . Other premature beats 03/17/2016  . Pulmonary interstitial fibrosis ([place]) 03/17/2016  . Rectocele 03/17/2016  . Symptomatic bradycardia 03/17/2016  . Chest pain 03/17/2016  . Lower extremity edema 10/13/2015  . Localized acrodermatitis continua of [place] 06/29/2015  . Cervical pain (neck) 06/14/2015  . Dysphagia 12/14/2014  . IPF (idiopathic pulmonary fibrosis) ([place]) 07/06/2014  . Paronychia 06/16/2014  . Bone pain 08/25/2013  . Lumbar radicular pain 08/25/2013  . Allergic rhinitis 07/14/2013  . Post-operative state 03/25/2013  . Preop cardiovascular exam 12/24/2012  . Ejection fraction   . Cystocele 12/11/2012  . Insomnia 10/23/2012  . Vaginal prolapse 04/02/2012  . Urinary incontinence 02/28/2012  . Other benign neoplasm of connective and other soft tissue of lower limb, including hip 09/08/2011  . Osteoporosis 03/20/2011  . Closed fracture of sacrum ([place]) 12/23/2010  . HIV positive ([place])   . CAD (coronary artery disease)   . Palpitations   . Dyslipidemia   . Persistent asthma   . Peptic ulcer disease   . GERD (gastroesophageal reflux disease)   .  Bradycardia   . Shortness of breath   . PULMONARY FIBROSIS 07/06/2008  . CONSTIPATION 07/22/2007  . WEIGHT LOSS-ABNORMAL 07/22/2007  . ABDOMINAL PAIN-GENERALIZED 07/22/2007    Past Surgical History:  Procedure Laterality Date  . APPENDECTOMY    . CARDIAC CATHETERIZATION N/A 03/21/2016   Procedure: Right/Left Heart Cath and Coronary Angiography;  Surgeon: Belva Crome, MD;  Location: [facility] CV LAB;  Service: Cardiovascular;  Laterality: N/A;  . CHOLECYSTECTOMY    . GASTRECTOMY    . VENTRAL HERNIA REPAIR       OB History   No obstetric history on file.      Home Medications    Prior to Admission medications   Medication Sig Start Date End Date Taking? Authorizing Provider  albuterol (PROVENTIL HFA;VENTOLIN HFA) 108 (90 Base) MCG/ACT inhaler Inhale 2 puffs into the lungs every 4 (four) hours as needed for wheezing or shortness of breath.  01/13/16   [provider]  amitriptyline (ELAVIL) 50 MG tablet Take 50 mg by mouth at bedtime.      [provider]  aspirin EC 81 MG tablet Take 1 tablet (81 mg total) by mouth daily. 03/17/16   Dorothy Spark, MD  atorvastatin (LIPITOR) 40 MG tablet Take 40 mg by mouth daily at 6 PM.     [provider]  budesonide-formoterol (SYMBICORT) 160-4.5 MCG/ACT inhaler Inhale 2 puffs into the lungs 2 (two) times daily.     [provider]  clopidogrel (PLAVIX) 75 MG tablet Take 75 mg by mouth daily.    [provider]  conjugated estrogens (PREMARIN) vaginal cream Place 3.26 Applicatorfuls vaginally every Wednesday. Pt only to use 1/4 of the applicator     [provider]  dextromethorphan-guaiFENesin (MUCINEX DM) 30-600 MG 12hr tablet Take 1 tablet by mouth 2 (two) times daily as needed for cough. 04/24/18   Aline August, MD  docusate sodium (COLACE) 100 MG capsule Take 100 mg by mouth daily as needed for mild constipation.  05/05/15   [provider]  Dolutegravir-lamiVUDine (DOVATO) 50-300 MG TABS Take 1 tablet by mouth daily.    [provider]  isosorbide mononitrate (IMDUR) 30 MG 24 hr tablet Take 1 tablet (30 mg total) by mouth daily. Patient not taking: Reported on 02/25/2018 03/21/16   Belva Crome, MD  metoprolol tartrate (LOPRESSOR) 25 MG tablet Take 12.5 mg by mouth 2 (two) times daily. 12/19/17   [provider]   nitroGLYCERIN (NITROSTAT) 0.4 MG SL tablet Place 1 tablet (0.4 mg total) under the tongue every 5 (five) minutes as needed for chest pain. 03/21/16   Belva Crome, MD  omeprazole (PRILOSEC) 40 MG capsule Take 40 mg by mouth 2 (two) times daily. 04/18/18   [provider]  Pirfenidone (ESBRIET) 267 MG CAPS Take 534 mg by mouth 2 (two) times daily.     [provider]  polyethylene glycol (MIRALAX / GLYCOLAX) packet Take 17 g by mouth daily as needed (constipation).     [provider]    Family History Family History  Problem Relation Age of Onset  . Coronary artery disease Other        family hx of on mother, father and siblings  . Bradycardia Sister   . Diabetes Mother   . Heart attack Mother   . Heart disease Mother   . Hypertension Mother   . Stroke Mother   . Bradycardia Brother     Social History Social History  Tobacco Use  . Smoking status: Former Smoker    Quit date: 03/13/1996    Years since quitting: 22.4  . Smokeless tobacco: Never Used  Substance Use Topics  . Alcohol use: No  . Drug use: No     Allergies   Isosorbide nitrate and Sulfamethoxazole-trimethoprim   Review of Systems Review of Systems  Respiratory: Positive for shortness of breath (chronic).   Cardiovascular: Positive for chest pain.  Gastrointestinal: Positive for abdominal pain, nausea and vomiting.  Neurological: Positive for light-headedness.  All other systems reviewed and are negative.    Physical Exam Updated Vital Signs BP 131/72   Pulse 88   Temp 99.6 F (37.6 C) (Rectal)   Resp (!) 22   Ht 4\' 11"  (1.499 m)   Wt 68 kg   SpO2 98%   BMI 30.30 kg/m   Physical Exam Vitals signs and nursing note reviewed.  Constitutional:      Appearance: She is well-developed.     Comments: Non toxic in NAD  HENT:     Head: Normocephalic and atraumatic.     Nose: Nose normal.     Mouth/Throat:     Mouth: Mucous membranes are dry.     Comments: Dry lips and MM.  Diffuse beefy erythema to oropharynx. Non visualized tonsils. Uvula midline. No petichiae. Normal protrusion of tongue, no sublingual fullness or tenderness. Normal phonation. Tolerating secretions  Eyes:     Conjunctiva/sclera: Conjunctivae normal.  Neck:     Musculoskeletal: Normal range of motion.     Comments: Trachea midline. No JVD. Non tender cervical lymph nodes  Cardiovascular:     Rate and Rhythm: Normal rate and regular rhythm.     Comments: 1+ radial and DP pulses bilaterally. Trace non pitting edema to ankles symmetric. No calf tenderness.  Pulmonary:     Effort: Pulmonary effort is normal.     Breath sounds: Normal breath sounds.     Comments: No reproducible chest wall tenderness  Abdominal:     General: Bowel sounds are normal.     Palpations: Abdomen is soft.     Comments: No epigastric or upper abd tenderness. Soft. G/R/R. No suprapubic or CVA tenderness. Negative Murphy's and McBurney's. Active BS to lower quadrants.   Musculoskeletal: Normal range of motion.  Skin:    General: Skin is warm and dry.     Capillary Refill: Capillary refill takes less than 2 seconds.  Neurological:     Mental Status: She is alert.  Psychiatric:        Behavior: Behavior normal.      ED Treatments / Results  Labs (all labs ordered are listed, but only abnormal results are displayed) Labs Reviewed  BASIC METABOLIC PANEL - Abnormal; Notable for the following components:      Result Value   Chloride 112 (*)    CO2 19 (*)    Glucose, Bld 107 (*)    All other components within normal limits  GROUP A STREP BY PCR  SARS CORONAVIRUS 2 (HOSPITAL ORDER, [facility])  CBC  TROPONIN I (HIGH SENSITIVITY)  TROPONIN I (HIGH SENSITIVITY)  LIPASE, BLOOD  HEPATIC FUNCTION PANEL    EKG EKG Interpretation  Date/Time:  Tuesday September 03 2018 20:45:09 EDT Ventricular Rate:  93 PR Interval:    QRS Duration: 95 QT Interval:  351 QTC Calculation: 437 R Axis:    -66 Text Interpretation:  Sinus rhythm Left anterior fascicular block Consider RVH w/ secondary repol  abnormality similar to prior EKG Confirmed by Malvin Johns [phone]) on 09/03/2018 9:03:48 PM   Radiology Dg Chest 2 View  Result Date: 09/03/2018 CLINICAL DATA:  Shortness of breath. EXAM: CHEST - 2 VIEW COMPARISON:  04/21/2018 FINDINGS: Coarse lung markings are suggestive for chronic changes. No focal airspace disease or pulmonary edema. Heart and mediastinum are within normal limits. Patient has a coronary artery stent. Trachea is midline. No large pleural effusions. Multiple surgical changes in the upper abdomen. IMPRESSION: No active cardiopulmonary disease. Electronically Signed   By: Markus Daft M.D.   On: 09/03/2018 18:11    Procedures Procedures (including critical care time)  Medications Ordered in ED Medications  sodium chloride 0.9 % bolus 1,000 mL (has no administration in time range)  ondansetron (ZOFRAN) injection 4 mg (has no administration in time range)  ondansetron (ZOFRAN) injection 4 mg (4 mg Intravenous Given 09/03/18 2214)  HYDROcodone-acetaminophen (HYCET) 7.5-325 mg/15 ml solution 10 mL (10 mLs Oral Given 09/03/18 2102)  sodium chloride 0.9 % bolus 500 mL (0 mLs Intravenous Stopped 09/04/18 0006)     Initial Impression / Assessment and Plan / ED Course  I have reviewed the triage vital signs and the nursing notes.  Pertinent labs & imaging results that were available during my care of the patient were reviewed by me and considered in my medical decision making (see chart for details).  Clinical Course as of Sep 03 17  Tue Sep 03, 2018  2031 Coarse lung markings are suggestive for chronic changes. No focal airspace disease or pulmonary edema. Heart and mediastinum are within normal limits. Patient has a coronary artery stent. Trachea is midline. No large pleural effusions. Multiple surgical changes in the upper abdomen.    DG Chest 2 View [CG]  2031 Pulse  Rate(!): 120 [CG]  2031 Pulse Rate(!): 115 [CG]  2130 Sinus rhythm Left anterior fascicular block Consider RVH w/ secondary repol abnormality similar to prior EKG Confirmed by Malvin Johns [phone]) on 09/03/2018 9:03:48 PM  EKG 12-Lead [CG]  2308 EMT was unable to ambulate pt, pt became light headed and could not ambulate. I evaluated pt who is vomiting. I attempted to ambulate her, she was able to stand up from bed but became instantly light headed, her HR went up to 128, RR 28 and she vomited. Will obtain orthostatics, give IVF.    [CG]    Clinical Course User Index [CG] Kinnie Feil, PA-C      68 yo with nausea, vomiting, abdominal pain, sore throat, dry cough, light headedness.   Exam reveals dry MM. Initial tachycardia resolved at rest, but profound orthostatics. She is unable to ambulate due to significant light headedness, with good effort. No rectal fever.   ddx includes hypovolemia from vomiting, dehydration causing orthostasis. Abd exam benign, non tender.  She has chronic DOE and chest pain, slightly worse since Friday that could be related to underlying viral process. COVID illness is on differential.  Her CP is atypical however, worse with active vomiting, sharp, localized but she has h/o CAD and MI.   0016: CBC, CMP, lipase unremarkable. Belly remains non tender. CXR negative. EKG non ischemic, Hs trop obtained in triage = 3. Her CP has been constant since arrival and atypical in nature, I doubt ACS and repeat trop not thought to be indicated as I doubt ACS in this clinical picture. COVID and rapid strep tests negative.   Will hand pt off to oncoming EDPA who will reassess and repeat orthostatic  VS after 1.5 L IVF, Po challenge. Consider admission if persistently orthostatic or symptomatic.  Final Clinical Impressions(s) / ED Diagnoses   Final diagnoses:  Nausea and vomiting in adult  Atypical chest pain  Postural lightheadedness    ED Discharge Orders    None        Kinnie Feil, PA-C 09/04/18 0019    Malvin Johns, MD 09/06/18 [phone]

## 2018-09-04 NOTE — Progress Notes (Signed)
Patient admitted after midnight. See H&P. In brief 68 yo hx hiv, pulmonary fibrosis, cad, hypertension presented with persistent n/v and dizziness. Workup reveals UTI. Provided with IV fluids, rocephin scheduled zofran. Will request PT/OT eval as well.     PE Gen: awake alert no acute distress CV: RRR no mgr no LE edema Resp: no increased work of breathing. BS clear Abdomen: non-distended non-tender +BS   A/P  1. Intractable nausea vomiting -abdominal exam appears benign patient is still nauseous.  LFTs lipase are normal.  UA consistent with UTI. CT no acute findings. Start rocephin and follow urine culture. Provide scheduled zofran for now. Clear liquids with diet to be advanced as tolerated. 2. Dizziness likely from orthostatic changes.  Has received fluids.  Recheck orthostatic tomorrow.  Physical therapy. 3. HIV -on antiretroviral.  As per the patient CD4 counts were more than 300 6 months ago and viral load was undetectable. 4. Pulmonary fibrosis on pirfenidone since 2017. 5. History of asthma not actively wheezing continue home inhalers. Oxygen saturation level greater than 90% on room air 6. CAD with with atypical chest pain at this time only happening on vomiting.  EKG was showing nonspecific changes high-sensitivity troponin was negative.  Continue home medications.   Dyanne Carrel, NP

## 2018-09-05 ENCOUNTER — Encounter (HOSPITAL_COMMUNITY): Payer: Self-pay | Admitting: Internal Medicine

## 2018-09-05 LAB — CBC
HCT: 36 % (ref 36.0–46.0)
Hemoglobin: 11.9 g/dL — ABNORMAL LOW (ref 12.0–15.0)
MCH: 32.2 pg (ref 26.0–34.0)
MCHC: 33.1 g/dL (ref 30.0–36.0)
MCV: 97.6 fL (ref 80.0–100.0)
Platelets: 249 10*3/uL (ref 150–400)
RBC: 3.69 MIL/uL — ABNORMAL LOW (ref 3.87–5.11)
RDW: 13.2 % (ref 11.5–15.5)
WBC: 5.5 10*3/uL (ref 4.0–10.5)
nRBC: 0 % (ref 0.0–0.2)

## 2018-09-05 LAB — BASIC METABOLIC PANEL
Anion gap: 8 (ref 5–15)
BUN: 8 mg/dL (ref 8–23)
CO2: 21 mmol/L — ABNORMAL LOW (ref 22–32)
Calcium: 8.2 mg/dL — ABNORMAL LOW (ref 8.9–10.3)
Chloride: 114 mmol/L — ABNORMAL HIGH (ref 98–111)
Creatinine, Ser: 0.79 mg/dL (ref 0.44–1.00)
GFR calc Af Amer: >60 mL/min (ref >60)
GFR calc non Af Amer: >60 mL/min (ref >60)
Glucose, Bld: 95 mg/dL (ref 70–99)
Potassium: 3.3 mmol/L — ABNORMAL LOW (ref 3.5–5.1)
Sodium: 143 mmol/L (ref 135–145)

## 2018-09-05 LAB — MAGNESIUM: Magnesium: 1.9 mg/dL (ref 1.7–2.4)

## 2018-09-05 MED ORDER — CEFDINIR 300 MG PO CAPS
300.0000 mg | ORAL_CAPSULE | Freq: Two times a day (BID) | ORAL | Status: DC
  Administered 2018-09-05: 12:00:00 300 mg via ORAL
  Filled 2018-09-05 (×2): qty 1

## 2018-09-05 MED ORDER — POTASSIUM CHLORIDE CRYS ER 20 MEQ PO TBCR: 40.0000 meq | EXTENDED_RELEASE_TABLET | Freq: Once | ORAL | Status: DC

## 2018-09-05 MED ORDER — ESBRIET 267 MG PO CAPS: 534.0000 mg | ORAL_CAPSULE | Freq: Two times a day (BID) | ORAL | 1 refills | Status: DC

## 2018-09-05 MED ORDER — SODIUM CHLORIDE 0.45 % IV BOLUS
500.0000 mL | Freq: Once | INTRAVENOUS | Status: AC
  Administered 2018-09-05: 500 mL via INTRAVENOUS

## 2018-09-05 MED ORDER — ONDANSETRON HCL 4 MG PO TABS: 4.0000 mg | ORAL_TABLET | Freq: Three times a day (TID) | ORAL | 0 refills | Status: DC | PRN

## 2018-09-05 MED ORDER — CEFDINIR 300 MG PO CAPS: 300.0000 mg | ORAL_CAPSULE | Freq: Two times a day (BID) | ORAL | 0 refills | Status: AC

## 2018-09-05 MED ORDER — POTASSIUM CHLORIDE CRYS ER 20 MEQ PO TBCR
40.0000 meq | EXTENDED_RELEASE_TABLET | Freq: Once | ORAL | Status: AC
  Administered 2018-09-05: 10:00:00 40 meq via ORAL
  Filled 2018-09-05: qty 2

## 2018-09-05 MED FILL — ONDANSETRON HCL 4 MG TABLET: 4 | 9 days supply | Qty: 9 | Fill #0

## 2018-09-05 MED FILL — CEFDINIR 300 MG CAPSULE: 300 | 4 days supply | Qty: 8 | Fill #0

## 2018-09-05 NOTE — TOC Transition Note (Addendum)
Transition of Care [facility]) - CM/SW Discharge Note   Patient Details  Name: [name] [name] MRN: [id number] Date of Birth: April 19, 1950  [facility] CM/SW Contact:  Zenon Mayo, RN Phone Number: 09/05/2018, 4:24 PM   Clinical Narrative:    Patient is for dc today will need HHPT, NCM offered choice, she chose [facility] . Referral given to [facility].  Awaiting to hear back to see if she can take referral. Butch Penny called NCM  Back stating she has not heard from the company whether they can take this referral or not.  Patient can be reached by cell phone at [phone] per RN.  Per Butch Penny with [facility] , they can take this referral.    Final next level of care: Home w Home Health Services Barriers to Discharge: No Barriers Identified   Patient Goals and CMS Choice Patient states their goals for this hospitalization and ongoing recovery are:: get better CMS Medicare.gov Compare Post Acute Care list provided to:: Patient Choice offered to / list presented to : Patient  Discharge Placement                       Discharge Plan and Services                DME Arranged: (NA)         HH Arranged: PT HH Agency: [facility] Date [place]: 09/05/18 Time [place]: 1623 Representative spoke with at [place]: [place] ([place]) Interventions     Readmission Risk Interventions No flowsheet data found.

## 2018-09-05 NOTE — Discharge Summary (Signed)
Physician Discharge Summary  [name] VQM:[id number] DOB: 16-Dec-1950 DOA: 09/03/2018  PCP: System, Pcp Not In  Admit date: 09/03/2018 Discharge date: 09/05/2018  Time spent: 45 minutes  Recommendations for Outpatient Follow-up:  1. Follow up with PCP 1-2 weeks for evaluation of symptoms and resolution of UTI. Recommend bmet to track potassium level  2. Home health PT 3.  4.  5. Discharge Diagnoses:  Principal Problem:   Nausea & vomiting Active Problems:   IPF (idiopathic pulmonary fibrosis) (HCC)   UTI (urinary tract infection)   CAD (coronary artery disease)   Human immunodeficiency virus (HIV) disease (HCC)   Atypical chest pain   Pulmonary fibrosis (HCC)   Nausea and/or vomiting   Discharge Condition: stable  Diet recommendation: heart healthy  Filed Weights   09/03/18 1719  Weight: 68 kg    History of present illness:  [name] [name] is a 68 y.o. female with history of HIV, pulmonary fibrosis, CAD, hypertension, asthma presented 6/24 to the ER because of persistent nausea vomiting with dizziness.  Patient denied any headache fever chills diarrhea.  Has chronic shortness of breath from pulmonary fibrosis.  Denied any blood in the vomitus or any sick contacts.  Lives with her daughter and nobody sick at home.  Patient stated that her vomiting  caused some chest pain which happened only on vomiting.  Was able to keep her medication in.  Dizziness happens on standing.   Hospital Course:   1. Intractable nausea vomiting-abdominal exam appears benign.LFTs lipase are normal. UA consistent with UTI. CT no acute findings. Provided with rocephin. Urine culture reveals e coli. Also provided with scheduled zofran. At discharge tolerating small amount regular diet. Will discharge with cefdinir 4 more days as well as zofran.  2. Dizziness likely from orthostatic changes. received fluids. Not orthostatic at discharge. Physical therapy evaluated and recommended HH PT 3. HIV-on  antiretroviral. As per the patient CD4 counts were more than300 6 months ago and viral load was undetectable. Follow up with ID  4. Pulmonary fibrosis on pirfenidone since 2017. 5. History of asthma not actively wheezing. Oxygen saturation level greater than 90% on room air. Stable at baseline at discharge 6. CAD with with atypical chest pain.EKG was showing nonspecific changes high-sensitivity troponin was negative. Reproducible. Resolved at discharge.  Procedures:    Consultations:    Discharge Exam: Vitals:   09/05/18 1230 09/05/18 1413  BP:    Pulse:    Resp: 18 18  Temp:  98 F (36.7 C)  SpO2:  99%    General: awake alert sitting up in bed eating lunch no acute distress Cardiovascular: RRR no mgr no LE edema Respiratory: normal effort BS clear bilaterally no wheeze  Discharge Instructions   Discharge Instructions    Call MD for:  persistant dizziness or light-headedness   Complete by: As directed    Call MD for:  persistant nausea and vomiting   Complete by: As directed    Diet - low sodium heart healthy   Complete by: As directed    Discharge instructions   Complete by: As directed    Take medications as prescribed Follow up with PCP 2-3 weeks for evaluation of symptoms and resolution of UTI   Increase activity slowly   Complete by: As directed      Allergies as of 09/05/2018      Reactions   Isosorbide Nitrate Other (See Comments)   Headaches   Sulfamethoxazole-trimethoprim Hives, Rash      Medication  List    STOP taking these medications   dextromethorphan-guaiFENesin 30-600 MG 12hr tablet Commonly known as: MUCINEX DM   isosorbide mononitrate 30 MG 24 hr tablet Commonly known as: Imdur     TAKE these medications   albuterol 108 (90 Base) MCG/ACT inhaler Commonly known as: VENTOLIN HFA Inhale 1-2 puffs into the lungs every 6 (six) hours as needed for wheezing or shortness of breath.   amitriptyline 50 MG tablet Commonly known as:  ELAVIL Take 50 mg by mouth at bedtime.   aspirin EC 81 MG tablet Take 1 tablet (81 mg total) by mouth daily.   atorvastatin 40 MG tablet Commonly known as: LIPITOR Take 40 mg by mouth daily at 6 PM.   cefdinir 300 MG capsule Commonly known as: OMNICEF Take 1 capsule (300 mg total) by mouth every 12 (twelve) hours for 4 days.   clopidogrel 75 MG tablet Commonly known as: PLAVIX Take 75 mg by mouth daily.   conjugated estrogens vaginal cream Commonly known as: PREMARIN Place 9.32 Applicatorfuls vaginally every Wednesday. Pt only to use 1/4 of the applicator   Dovato 67-124 MG Tabs Generic drug: Dolutegravir-lamiVUDine Take 1 tablet by mouth daily.   Esbriet 267 MG Caps Generic drug: Pirfenidone Take 534 mg by mouth 2 (two) times daily with a meal. What changed:   how much to take  when to take this   metoprolol tartrate 25 MG tablet Commonly known as: LOPRESSOR Take 12.5 mg by mouth 2 (two) times daily.   nitroGLYCERIN 0.4 MG SL tablet Commonly known as: NITROSTAT Place 1 tablet (0.4 mg total) under the tongue every 5 (five) minutes as needed for chest pain.   omeprazole 40 MG capsule Commonly known as: PRILOSEC Take 40 mg by mouth 2 (two) times daily.   ondansetron 4 MG tablet Commonly known as: Zofran Take 1 tablet (4 mg total) by mouth every 8 (eight) hours as needed for nausea or vomiting.   polyethylene glycol 17 g packet Commonly known as: MIRALAX / GLYCOLAX Take 17 g by mouth daily as needed (constipation).   Symbicort 160-4.5 MCG/ACT inhaler Generic drug: budesonide-formoterol Inhale 2 puffs into the lungs 2 (two) times daily.   Vitamin D2 50 MCG (2000 UT) Tabs Take 2,000 Units by mouth daily.      Allergies  Allergen Reactions  . Isosorbide Nitrate Other (See Comments)    Headaches  . Sulfamethoxazole-Trimethoprim Hives and Rash      The results of significant diagnostics from this hospitalization (including imaging, microbiology,  ancillary and laboratory) are listed below for reference.    Significant Diagnostic Studies: Dg Chest 2 View  Result Date: 09/03/2018 CLINICAL DATA:  Shortness of breath. EXAM: CHEST - 2 VIEW COMPARISON:  04/21/2018 FINDINGS: Coarse lung markings are suggestive for chronic changes. No focal airspace disease or pulmonary edema. Heart and mediastinum are within normal limits. Patient has a coronary artery stent. Trachea is midline. No large pleural effusions. Multiple surgical changes in the upper abdomen. IMPRESSION: No active cardiopulmonary disease. Electronically Signed   By: Markus Daft M.D.   On: 09/03/2018 18:11   Ct Head Wo Contrast  Result Date: 09/04/2018 CLINICAL DATA:  Persistent nausea and vomiting EXAM: CT HEAD WITHOUT CONTRAST TECHNIQUE: Contiguous axial images were obtained from the base of the skull through the vertex without intravenous contrast. COMPARISON:  02/19/2011 FINDINGS: Brain: No evidence of acute infarction, hemorrhage, hydrocephalus, extra-axial collection or mass lesion/mass effect. Mild patchy low-density in the cerebral white matter attributed to chronic  microvascular ischemia given age and vascular risk factors. Vascular: No hyperdense vessel or unexpected calcification. Skull: Normal. Negative for fracture or focal lesion. Sinuses/Orbits: No acute finding. IMPRESSION: Negative head CT. Electronically Signed   By: Monte Fantasia M.D.   On: 09/04/2018 06:17   Ct Renal Stone Study  Result Date: 09/04/2018 CLINICAL DATA:  Acute periumbilical abdominal pain. EXAM: CT ABDOMEN AND PELVIS WITHOUT CONTRAST TECHNIQUE: Multidetector CT imaging of the abdomen and pelvis was performed following the standard protocol without IV contrast. COMPARISON:  CT scan of April 21, 2018. FINDINGS: Lower chest: Moderate size sliding-type hiatal hernia is noted. No acute abnormality seen in visualized lung bases. Hepatobiliary: No focal liver abnormality is seen. Status post cholecystectomy. No  biliary dilatation. Pancreas: Unremarkable. No pancreatic ductal dilatation or surrounding inflammatory changes. Spleen: Normal in size without focal abnormality. Adrenals/Urinary Tract: Adrenal glands are unremarkable. Kidneys are normal, without renal calculi, focal lesion, or hydronephrosis. Bladder is unremarkable. Stomach/Bowel: Status post gastric surgery. Status post appendectomy. There is no evidence of bowel obstruction or inflammation. Vascular/Lymphatic: Aortic atherosclerosis. No enlarged abdominal or pelvic lymph nodes. Reproductive: Status post hysterectomy. No adnexal masses. Other: No abdominal wall hernia or abnormality. No abdominopelvic ascites. Musculoskeletal: No acute or significant osseous findings. IMPRESSION: Moderate size sliding-type hiatal hernia. No acute abnormality seen in the abdomen or pelvis. Aortic Atherosclerosis (ICD10-I70.0). Electronically Signed   By: Marijo Conception M.D.   On: 09/04/2018 07:53    Microbiology: Recent Results (from the past 240 hour(s))  Group A Strep by PCR     Status: None   Collection Time: 09/03/18  8:52 PM   Specimen: Throat; Sterile Swab  Result Value Ref Range Status   Group A Strep by PCR NOT DETECTED NOT DETECTED Final    Comment: Performed at [facility], [place] [address], [place], [place] [address]  SARS Coronavirus 2 (CEPHEID- Performed in [place] hospital lab), Hosp Order     Status: None   Collection Time: 09/03/18  8:55 PM   Specimen: Throat; Nasopharyngeal  Result Value Ref Range Status   SARS Coronavirus 2 NEGATIVE NEGATIVE Final    Comment: (NOTE) If result is NEGATIVE SARS-CoV-2 target nucleic acids are NOT DETECTED. The SARS-CoV-2 RNA is generally detectable in upper and lower  respiratory specimens during the acute phase of infection. The lowest  concentration of SARS-CoV-2 viral copies this assay can detect is 250  copies / mL. A negative result does not preclude SARS-CoV-2 infection  and should not be used  as the sole basis for treatment or other  patient management decisions.  A negative result may occur with  improper specimen collection / handling, submission of specimen other  than nasopharyngeal swab, presence of viral mutation(s) within the  areas targeted by this assay, and inadequate number of viral copies  (<250 copies / mL). A negative result must be combined with clinical  observations, patient history, and epidemiological information. If result is POSITIVE SARS-CoV-2 target nucleic acids are DETECTED. The SARS-CoV-2 RNA is generally detectable in upper and lower  respiratory specimens dur ing the acute phase of infection.  Positive  results are indicative of active infection with SARS-CoV-2.  Clinical  correlation with patient history and other diagnostic information is  necessary to determine patient infection status.  Positive results do  not rule out bacterial infection or co-infection with other viruses. If result is PRESUMPTIVE POSTIVE SARS-CoV-2 nucleic acids MAY BE PRESENT.   A presumptive positive result was obtained on the  submitted specimen  and confirmed on repeat testing.  While 2019 novel coronavirus  (SARS-CoV-2) nucleic acids may be present in the submitted sample  additional confirmatory testing may be necessary for epidemiological  and / or clinical management purposes  to differentiate between  SARS-CoV-2 and other Sarbecovirus currently known to infect humans.  If clinically indicated additional testing with an alternate test  methodology [phone]) is advised. The SARS-CoV-2 RNA is generally  detectable in upper and lower respiratory sp ecimens during the acute  phase of infection. The expected result is Negative. Fact Sheet for Patients:  StrictlyIdeas.no Fact Sheet for Healthcare Providers: BankingDealers.co.za This test is not yet approved or cleared by the [place] FDA and has been authorized for  detection and/or diagnosis of SARS-CoV-2 by FDA under an Emergency Use Authorization (EUA).  This EUA will remain in effect (meaning this test can be used) for the duration of the COVID-19 declaration under Section 564(b)(1) of the Act, 21 U.S.C. section 360bbb-3(b)(1), unless the authorization is terminated or revoked sooner. Performed at [facility], [place] [address], [place], [place] [address]   Culture, Urine     Status: Abnormal (Preliminary result)   Collection Time: 09/04/18  7:48 AM   Specimen: Urine, Clean Catch  Result Value Ref Range Status   Specimen Description URINE, CLEAN CATCH  Final   Special Requests NONE  Final   Culture (A)  Final    >=100,000 COLONIES/mL ESCHERICHIA COLI SUSCEPTIBILITIES TO FOLLOW Performed at [facility], [place] [address], [place], [place] [address]    Report Status PENDING  Incomplete     Labs: Basic Metabolic Panel: Recent Labs  Lab 09/03/18 1752 09/04/18 0840 09/05/18 0500 09/05/18 0519  NA 140 142 143  --   K 3.9 3.8 3.3*  --   CL 112* 115* 114*  --   CO2 19* 19* 21*  --   GLUCOSE 107* 93 95  --   BUN 14 12 8   --   CREATININE 0.91 0.77 0.79  --   CALCIUM 9.2 8.2* 8.2*  --   MG  --   --   --  1.9   Liver Function Tests: Recent Labs  Lab 09/03/18 2052 09/04/18 0840  AST 21 16  ALT 13 12  ALKPHOS 107 83  BILITOT 0.6 0.5  PROT 6.8 5.4*  ALBUMIN 3.8 2.9*   Recent Labs  Lab 09/03/18 2052  LIPASE 28   No results for input(s): AMMONIA in the last 168 hours. CBC: Recent Labs  Lab 09/03/18 1752 09/04/18 0840 09/05/18 0500  WBC 9.1 6.7 5.5  NEUTROABS  --  4.1  --   HGB 14.6 12.4 11.9*  HCT 45.0 39.4 36.0  MCV 99.8 103.4* 97.6  PLT 321 234 249   Cardiac Enzymes: No results for input(s): CKTOTAL, CKMB, CKMBINDEX, TROPONINI in the last 168 hours. BNP: BNP (last 3 results) Recent Labs    04/21/18 1942 04/23/18 1044  BNP 27.9 131.4*    ProBNP (last 3 results) No results for input(s): PROBNP in the  last 8760 hours.  CBG: No results for input(s): GLUCAP in the last 168 hours.     Signed:  Radene Gunning NP [facility] 09/05/2018, 2:32 PM

## 2018-09-05 NOTE — Progress Notes (Signed)
Discharge instructions given. Pt verbalized understanding and all questions were answered.  

## 2018-09-05 NOTE — Progress Notes (Signed)
Physical Therapy Treatment Patient Details Name: [name] [name] MRN: [id number] DOB: 1951/01/21 Today's Date: 09/05/2018    History of Present Illness Pt is a 68 y/o female admitted secondary to nausea, vomiting and dizziness. UA consistent with UTI. CT negative for acute abnormality. PMH includes CAD, HIV, pulmonary fibrosis, and asthma.     PT Comments    Pt was still nauseous this morning but was feeling much better during therapy. Pt is limited in safe mobility by decreased balance and endurance. Pt currently supervision for bed mobility, and min guard for transfers and ambulation of 80 feet without AD. D/c plans remain appropriate at this time. PT will continue to follow acutely.    Follow Up Recommendations  Home health PT;Supervision for mobility/OOB(pending progression )     Equipment Recommendations  None recommended by PT    Recommendations for Other Services OT consult     Precautions / Restrictions Precautions Precautions: Fall Restrictions Weight Bearing Restrictions: No    Mobility  Bed Mobility Overal bed mobility: Needs Assistance Bed Mobility: Supine to Sit;Sit to Supine     Supine to sit: Supervision Sit to supine: Supervision   General bed mobility comments: Supervision for safety. Pt reports increased dizziness in sitting, however, BP remained stable.   Transfers Overall transfer level: Needs assistance Equipment used: Rolling walker (2 wheeled) Transfers: Sit to/from Stand Sit to Stand: Min guard         General transfer comment: Min guard for safety.  good power up and steadying, no dizziness or nausea  Ambulation/Gait Ambulation/Gait assistance: Min guard Gait Distance (Feet): 80 Feet Assistive device: None Gait Pattern/deviations: Step-through pattern;Decreased step length - left;Decreased step length - right Gait velocity: slowed Gait velocity interpretation: 1.31 - 2.62 ft/sec, indicative of limited community ambulator General Gait  Details: min guard for safety with slow, mildly unsteady gait, pt initially reaching out for furniture to steady but as she progessess is more steady       Balance Overall balance assessment: Needs assistance Sitting-balance support: No upper extremity supported;Feet supported Sitting balance-Leahy Scale: Good     Standing balance support: Bilateral upper extremity supported;During functional activity Standing balance-Leahy Scale: Fair                              Cognition Arousal/Alertness: Awake/alert Behavior During Therapy: WFL for tasks assessed/performed Overall Cognitive Status: Within Functional Limits for tasks assessed                                           General Comments General comments (skin integrity, edema, etc.): VSS throughout      Pertinent Vitals/Pain Pain Assessment: No/denies pain           PT Goals (current goals can now be found in the care plan section) Acute Rehab PT Goals Patient Stated Goal: to feel better PT Goal Formulation: With patient Time For Goal Achievement: 09/18/18 Potential to Achieve Goals: Good Progress towards PT goals: Progressing toward goals    Frequency    Min 3X/week      PT Plan Current plan remains appropriate       AM-PAC PT "6 Clicks" Mobility   Outcome Measure  Help needed turning from your back to your side while in a flat bed without using bedrails?: None Help needed moving from lying on your  back to sitting on the side of a flat bed without using bedrails?: None Help needed moving to and from a bed to a chair (including a wheelchair)?: None Help needed standing up from a chair using your arms (e.g., wheelchair or bedside chair)?: None Help needed to walk in hospital room?: A Little Help needed climbing 3-5 steps with a railing? : A Little 6 Click Score: 22    End of Session Equipment Utilized During Treatment: Gait belt Activity Tolerance: Patient tolerated  treatment well(dizziness ) Patient left: in bed;with call bell/phone within reach;with bed alarm set Nurse Communication: Mobility status PT Visit Diagnosis: Difficulty in walking, not elsewhere classified (R26.2);Dizziness and giddiness (R42)     Time: 7357-8978 PT Time Calculation (min) (ACUTE ONLY): 18 min  Charges:  $Gait Training: 8-22 mins                     Felicia Both B. Migdalia Dk PT, DPT Acute Rehabilitation Services Pager [phone] Office [phone]    [place] 09/05/2018, 11:23 AM

## 2018-09-06 LAB — URINE CULTURE: Culture: >=100000 — AB

## 2019-02-19 MED ORDER — CHLOROBUTANOL-EUGENOL & APAP
0.40 | Status: DC
Start: ? — End: 2019-02-19

## 2019-02-19 MED ORDER — CELLULOSE SODIUM PHOSPHATE VI
5000.00 | Status: DC
Start: 2019-02-19 — End: 2019-02-19

## 2019-02-19 MED ORDER — Medication
35.00 | Status: DC
Start: 2019-02-20 — End: 2019-02-19

## 2019-02-19 MED ORDER — ATHLETES FOOT EX
50.00 | CUTANEOUS | Status: DC
Start: ? — End: 2019-02-19

## 2019-02-19 MED ORDER — CVS KIDPANT BOYS X-LARGE MISC
40.00 | Status: DC
Start: 2019-02-20 — End: 2019-02-19

## 2019-02-19 MED ORDER — GLUCOSAMINE-CHONDROIT-COLLAGEN PO
100.00 | ORAL | Status: DC
Start: ? — End: 2019-02-19

## 2019-02-19 MED ORDER — SUBDUE PLUS PO LIQD
75.00 | ORAL | Status: DC
Start: 2019-02-20 — End: 2019-02-19

## 2019-02-19 MED ORDER — PREPARATION H TOTABLES 1-0.25-14.4-15 % RE CREA
2.00 | TOPICAL_CREAM | RECTAL | Status: DC
Start: 2019-02-19 — End: 2019-02-19

## 2019-02-19 MED ORDER — DARK TANNING SPF4 EX
50.00 | CUTANEOUS | Status: DC
Start: 2019-02-20 — End: 2019-02-19

## 2019-02-19 MED ORDER — HCA VITAMIN E 200 UNITS PO CAPS
300.00 | ORAL_CAPSULE | ORAL | Status: DC
Start: 2019-02-20 — End: 2019-02-19

## 2019-02-19 MED ORDER — FP STAY AWAKE PO
20.00 | ORAL | Status: DC
Start: 2019-02-20 — End: 2019-02-19

## 2019-02-19 MED ORDER — Medication
12.50 | Status: DC
Start: 2019-02-19 — End: 2019-02-19

## 2019-02-19 MED ORDER — EQUATE NICOTINE 4 MG MT GUM
4.00 | CHEWING_GUM | OROMUCOSAL | Status: DC
Start: ? — End: 2019-02-19

## 2019-02-19 MED ORDER — VICON FORTE PO CAPS
17.00 | ORAL_CAPSULE | ORAL | Status: DC
Start: ? — End: 2019-02-19

## 2019-02-19 MED ORDER — ACCU-PRO PUMP SET/VENT MISC
2.50 | Status: DC
Start: ? — End: 2019-02-19

## 2019-02-19 MED ORDER — PHENYLEPH-POT GUAIACOLSULF
81.00 | Status: DC
Start: 2019-02-20 — End: 2019-02-19

## 2019-02-19 MED ORDER — BAYER WOMENS 81-300 MG PO TABS
40.00 | ORAL_TABLET | ORAL | Status: DC
Start: 2019-02-20 — End: 2019-02-19

## 2019-03-03 ENCOUNTER — Encounter (HOSPITAL_COMMUNITY): Payer: Self-pay | Admitting: *Deleted

## 2019-03-03 NOTE — Progress Notes (Signed)
Received referral from Dr. Ottie Glazier internal medicine MD at [facility] for this pt to participate in Pulmonary rehab with the diagnosis of ILD.  Pt with recent hospitalization from 12/8-12/11 acute respiratory failure with hypoxia. Clinical review of pt follow up appt on 12/16 Internal medicine/primary Md  office note. Pt making progress rarely uses Oxygen - feels back to baseline. Pt with Covid Risk Score - 5. Pt appropriate for scheduling for Pulmonary rehab to begin after pt completes the following appointments - 12/24 pulmonology and 1/4 cardiology.  Will forward to support staff for  verification of insurance eligibility/benefits.  Pulmonary MD order sent to Dr Tamala Julian for signature.  Once received back and pt has satisfactorily completed her follow up app on 12/24 and 1/4 - pt can be scheduled for initial assessment . Cherre Huger, BSN Cardiac and Training and development officer

## 2019-03-04 ENCOUNTER — Telehealth (HOSPITAL_COMMUNITY): Payer: Self-pay

## 2019-03-04 NOTE — Telephone Encounter (Signed)
Pt insurance is active and benefits verified through [employer]. Co-pay $0.00, DED $0.00/$0.00 met, out of pocket $6,700.00/$0.00 met, co-insurance 0%. No pre-authorization required. Keith/[employer], 03/04/2019 @ 353PM, REF# [id number] °

## 2019-05-09 ENCOUNTER — Telehealth (HOSPITAL_COMMUNITY): Payer: Self-pay

## 2019-05-09 NOTE — Telephone Encounter (Signed)
Pt insurance is active and benefits verified through [facility] Medicare Co-pay 0, DED 0/0 met, out of pocket $7,550/0 met, co-insurance 20%. no pre-authorization required, REF# [id number]

## 2019-05-29 ENCOUNTER — Telehealth (HOSPITAL_COMMUNITY): Payer: Self-pay | Admitting: *Deleted

## 2019-05-29 NOTE — Telephone Encounter (Signed)
Attempted to contact pt regarding interest in pulmonary rehab.  Pt seen on 3/17 by Dr Jonnie Finner at [facility].  No detailed notes of this visit are in care everywhere to review.  I do see however that a referral to speech therapy for evaluation of swallowing has been ordered.  Unable to leave message, pt does not have a answering machine in order to leave a message.  Phone number used  [phone].  Will send pt letter along with brochure. Cherre Huger, BSN Cardiac and Training and development officer

## 2019-06-09 ENCOUNTER — Ambulatory Visit: Payer: Medicare Other | Attending: Internal Medicine

## 2019-06-09 DIAGNOSIS — Z23 Encounter for immunization: Secondary | ICD-10-CM

## 2019-06-09 NOTE — Progress Notes (Signed)
   Covid-19 Vaccination Clinic  Name:  [name] [name]    MRN: [id number] DOB: 1950-12-17  06/09/2019  [name] [name] was observed post Covid-19 immunization for 15 minutes without incident. She was provided with Vaccine Information Sheet and instruction to access the V-Safe system.   [name] [name] was instructed to call 911 with any severe reactions post vaccine: Marland Kitchen Difficulty breathing  . Swelling of face and throat  . A fast heartbeat  . A bad rash all over body  . Dizziness and weakness   Immunizations Administered    Name Date Dose VIS Date Route   Pfizer COVID-19 Vaccine 06/09/2019 12:17 PM 0.3 mL 02/21/2019 Intramuscular   Manufacturer: [place]   Lot: [id number]   [place]: [id number]

## 2019-06-16 ENCOUNTER — Telehealth (HOSPITAL_COMMUNITY): Payer: Self-pay

## 2019-06-16 NOTE — Telephone Encounter (Signed)
No response from pt regarding PR.   Closed referral. 

## 2019-07-02 ENCOUNTER — Ambulatory Visit: Payer: Medicare Other | Attending: Internal Medicine

## 2019-07-02 DIAGNOSIS — Z23 Encounter for immunization: Secondary | ICD-10-CM

## 2019-07-02 NOTE — Progress Notes (Signed)
   Covid-19 Vaccination Clinic  Name:  [name] [name]    MRN: [id number] DOB: [birth year]  07/02/2019  [name] was observed post Covid-19 immunization for 15 minutes without incident. She was provided with Vaccine Information Sheet and instruction to access the V-Safe system.   [name] was instructed to call 911 with any severe reactions post vaccine: Marland Kitchen Difficulty breathing  . Swelling of face and throat  . A fast heartbeat  . A bad rash all over body  . Dizziness and weakness   Immunizations Administered    Name Date Dose VIS Date Route   Pfizer COVID-19 Vaccine 07/02/2019  2:34 PM 0.3 mL 05/07/2018 Intramuscular   Manufacturer: [place]   Lot: [id number]   [place]: [id number]

## 2019-07-04 ENCOUNTER — Other Ambulatory Visit: Payer: Self-pay

## 2019-07-04 ENCOUNTER — Inpatient Hospital Stay (HOSPITAL_COMMUNITY)
Admission: EM | Admit: 2019-07-04 | Discharge: 2019-07-07 | DRG: 392 | Disposition: A | Discharge status: Home / Self Care | Payer: Medicare Other | Attending: Internal Medicine | Admitting: Internal Medicine

## 2019-07-04 DIAGNOSIS — Z9049 Acquired absence of other specified parts of digestive tract: Secondary | ICD-10-CM

## 2019-07-04 DIAGNOSIS — I10 Essential (primary) hypertension: Secondary | ICD-10-CM | POA: Diagnosis present

## 2019-07-04 DIAGNOSIS — Z21 Asymptomatic human immunodeficiency virus [HIV] infection status: Secondary | ICD-10-CM | POA: Diagnosis present

## 2019-07-04 DIAGNOSIS — Z888 Allergy status to other drugs, medicaments and biological substances status: Secondary | ICD-10-CM

## 2019-07-04 DIAGNOSIS — Z823 Family history of stroke: Secondary | ICD-10-CM

## 2019-07-04 DIAGNOSIS — Z7982 Long term (current) use of aspirin: Secondary | ICD-10-CM

## 2019-07-04 DIAGNOSIS — R112 Nausea with vomiting, unspecified: Secondary | ICD-10-CM | POA: Diagnosis not present

## 2019-07-04 DIAGNOSIS — E785 Hyperlipidemia, unspecified: Secondary | ICD-10-CM | POA: Diagnosis present

## 2019-07-04 DIAGNOSIS — Z8249 Family history of ischemic heart disease and other diseases of the circulatory system: Secondary | ICD-10-CM

## 2019-07-04 DIAGNOSIS — Z79899 Other long term (current) drug therapy: Secondary | ICD-10-CM

## 2019-07-04 DIAGNOSIS — R600 Localized edema: Secondary | ICD-10-CM | POA: Diagnosis not present

## 2019-07-04 DIAGNOSIS — Z7951 Long term (current) use of inhaled steroids: Secondary | ICD-10-CM

## 2019-07-04 DIAGNOSIS — Z87440 Personal history of urinary (tract) infections: Secondary | ICD-10-CM

## 2019-07-04 DIAGNOSIS — K2100 Gastro-esophageal reflux disease with esophagitis, without bleeding: Secondary | ICD-10-CM | POA: Diagnosis present

## 2019-07-04 DIAGNOSIS — N12 Tubulo-interstitial nephritis, not specified as acute or chronic: Secondary | ICD-10-CM

## 2019-07-04 DIAGNOSIS — Z20822 Contact with and (suspected) exposure to covid-19: Secondary | ICD-10-CM | POA: Diagnosis present

## 2019-07-04 DIAGNOSIS — B20 Human immunodeficiency virus [HIV] disease: Secondary | ICD-10-CM | POA: Diagnosis present

## 2019-07-04 DIAGNOSIS — N390 Urinary tract infection, site not specified: Secondary | ICD-10-CM | POA: Diagnosis present

## 2019-07-04 DIAGNOSIS — Z833 Family history of diabetes mellitus: Secondary | ICD-10-CM

## 2019-07-04 DIAGNOSIS — E860 Dehydration: Secondary | ICD-10-CM | POA: Diagnosis present

## 2019-07-04 DIAGNOSIS — J84112 Idiopathic pulmonary fibrosis: Secondary | ICD-10-CM | POA: Diagnosis present

## 2019-07-04 DIAGNOSIS — K449 Diaphragmatic hernia without obstruction or gangrene: Secondary | ICD-10-CM | POA: Diagnosis present

## 2019-07-04 DIAGNOSIS — K5900 Constipation, unspecified: Secondary | ICD-10-CM | POA: Diagnosis present

## 2019-07-04 DIAGNOSIS — Z8711 Personal history of peptic ulcer disease: Secondary | ICD-10-CM

## 2019-07-04 DIAGNOSIS — I2510 Atherosclerotic heart disease of native coronary artery without angina pectoris: Secondary | ICD-10-CM | POA: Diagnosis present

## 2019-07-04 DIAGNOSIS — Z87891 Personal history of nicotine dependence: Secondary | ICD-10-CM

## 2019-07-04 DIAGNOSIS — Z882 Allergy status to sulfonamides status: Secondary | ICD-10-CM

## 2019-07-04 DIAGNOSIS — Z7902 Long term (current) use of antithrombotics/antiplatelets: Secondary | ICD-10-CM

## 2019-07-04 DIAGNOSIS — T8089XA Other complications following infusion, transfusion and therapeutic injection, initial encounter: Secondary | ICD-10-CM | POA: Diagnosis not present

## 2019-07-04 LAB — URINALYSIS, ROUTINE W REFLEX MICROSCOPIC
Bilirubin Urine: NEGATIVE
Glucose, UA: NEGATIVE mg/dL
Ketones, ur: NEGATIVE mg/dL
Leukocytes,Ua: NEGATIVE
Nitrite: POSITIVE — AB
Protein, ur: 30 mg/dL — AB
Specific Gravity, Urine: 1.026 (ref 1.005–1.030)
pH: 5 (ref 5.0–8.0)

## 2019-07-04 LAB — COMPREHENSIVE METABOLIC PANEL
ALT: 17 U/L (ref 0–44)
AST: 19 U/L (ref 15–41)
Albumin: 3.7 g/dL (ref 3.5–5.0)
Alkaline Phosphatase: 106 U/L (ref 38–126)
Anion gap: 9 (ref 5–15)
BUN: 13 mg/dL (ref 8–23)
CO2: 21 mmol/L — ABNORMAL LOW (ref 22–32)
Calcium: 9 mg/dL (ref 8.9–10.3)
Chloride: 109 mmol/L (ref 98–111)
Creatinine, Ser: 1.04 mg/dL — ABNORMAL HIGH (ref 0.44–1.00)
GFR calc Af Amer: >60 mL/min (ref >60)
GFR calc non Af Amer: 56 mL/min — ABNORMAL LOW (ref >60)
Glucose, Bld: 140 mg/dL — ABNORMAL HIGH (ref 70–99)
Potassium: 3.6 mmol/L (ref 3.5–5.1)
Sodium: 139 mmol/L (ref 135–145)
Total Bilirubin: 0.7 mg/dL (ref 0.3–1.2)
Total Protein: 7 g/dL (ref 6.5–8.1)

## 2019-07-04 LAB — CBC
HCT: 43.2 % (ref 36.0–46.0)
Hemoglobin: 13.4 g/dL (ref 12.0–15.0)
MCH: 29.8 pg (ref 26.0–34.0)
MCHC: 31 g/dL (ref 30.0–36.0)
MCV: 96 fL (ref 80.0–100.0)
Platelets: 347 10*3/uL (ref 150–400)
RBC: 4.5 MIL/uL (ref 3.87–5.11)
RDW: 14.5 % (ref 11.5–15.5)
WBC: 9.7 10*3/uL (ref 4.0–10.5)
nRBC: 0 % (ref 0.0–0.2)

## 2019-07-04 LAB — LIPASE, BLOOD: Lipase: 29 U/L (ref 11–51)

## 2019-07-04 NOTE — ED Triage Notes (Signed)
Patient c/o vomiting and weakness since getting her 2nd Covid vaccine [employer]) on Wednesday.

## 2019-07-05 ENCOUNTER — Encounter (HOSPITAL_COMMUNITY): Payer: Self-pay | Admitting: Student

## 2019-07-05 ENCOUNTER — Emergency Department (HOSPITAL_COMMUNITY): Payer: Medicare Other

## 2019-07-05 DIAGNOSIS — R112 Nausea with vomiting, unspecified: Principal | ICD-10-CM

## 2019-07-05 LAB — SARS CORONAVIRUS 2 (TAT 6-24 HRS): SARS Coronavirus 2: NEGATIVE

## 2019-07-05 MED ORDER — PROCHLORPERAZINE EDISYLATE 10 MG/2ML IJ SOLN
5.0000 mg | Freq: Once | INTRAMUSCULAR | Status: DC
  Filled 2019-07-05: qty 2

## 2019-07-05 MED ORDER — DOLUTEGRAVIR-LAMIVUDINE 50-300 MG PO TABS: 1.0000 | ORAL_TABLET | Freq: Every day | ORAL | Status: DC

## 2019-07-05 MED ORDER — SODIUM CHLORIDE 0.9 % IV BOLUS
1000.0000 mL | Freq: Once | INTRAVENOUS | Status: AC
  Administered 2019-07-05: 1000 mL via INTRAVENOUS

## 2019-07-05 MED ORDER — BISACODYL 5 MG PO TBEC: 5.0000 mg | DELAYED_RELEASE_TABLET | Freq: Every day | ORAL | Status: DC | PRN

## 2019-07-05 MED ORDER — ALBUTEROL SULFATE (2.5 MG/3ML) 0.083% IN NEBU: 2.5000 mg | INHALATION_SOLUTION | Freq: Four times a day (QID) | RESPIRATORY_TRACT | Status: DC | PRN

## 2019-07-05 MED ORDER — ONDANSETRON HCL 4 MG/2ML IJ SOLN
4.0000 mg | Freq: Four times a day (QID) | INTRAMUSCULAR | Status: DC | PRN
  Administered 2019-07-06 (×2): 4 mg via INTRAVENOUS
  Filled 2019-07-05 (×3): qty 2

## 2019-07-05 MED ORDER — MOMETASONE FURO-FORMOTEROL FUM 200-5 MCG/ACT IN AERO
2.0000 | INHALATION_SPRAY | Freq: Two times a day (BID) | RESPIRATORY_TRACT | Status: DC
  Administered 2019-07-05 – 2019-07-07 (×4): 2 via RESPIRATORY_TRACT
  Filled 2019-07-05: qty 8.8

## 2019-07-05 MED ORDER — CAPSAICIN 0.025 % EX CREA
TOPICAL_CREAM | Freq: Two times a day (BID) | CUTANEOUS | Status: DC
  Filled 2019-07-05: qty 60

## 2019-07-05 MED ORDER — DOLUTEGRAVIR SODIUM 50 MG PO TABS
50.0000 mg | ORAL_TABLET | Freq: Every day | ORAL | Status: DC
  Administered 2019-07-06 – 2019-07-07 (×2): 50 mg via ORAL
  Filled 2019-07-05 (×3): qty 1

## 2019-07-05 MED ORDER — LACTATED RINGERS IV SOLN: INTRAVENOUS | Status: DC

## 2019-07-05 MED ORDER — HYDRALAZINE HCL 20 MG/ML IJ SOLN: 5.0000 mg | INTRAMUSCULAR | Status: DC | PRN

## 2019-07-05 MED ORDER — HYDROCODONE-ACETAMINOPHEN 5-325 MG PO TABS
1.0000 | ORAL_TABLET | ORAL | Status: DC | PRN
  Administered 2019-07-06: 2 via ORAL
  Filled 2019-07-05: qty 2

## 2019-07-05 MED ORDER — LORAZEPAM 2 MG/ML IJ SOLN: 1.0000 mg | Freq: Once | INTRAMUSCULAR | Status: DC

## 2019-07-05 MED ORDER — ATORVASTATIN CALCIUM 40 MG PO TABS
40.0000 mg | ORAL_TABLET | Freq: Every day | ORAL | Status: DC
  Administered 2019-07-06 – 2019-07-07 (×2): 40 mg via ORAL
  Filled 2019-07-05 (×2): qty 1

## 2019-07-05 MED ORDER — SODIUM CHLORIDE 0.9 % IV SOLN
1.0000 g | Freq: Once | INTRAVENOUS | Status: AC
  Administered 2019-07-05: 09:00:00 1 g via INTRAVENOUS
  Filled 2019-07-05: qty 10

## 2019-07-05 MED ORDER — ACETAMINOPHEN 325 MG PO TABS: 650.0000 mg | ORAL_TABLET | Freq: Four times a day (QID) | ORAL | Status: DC | PRN

## 2019-07-05 MED ORDER — AMITRIPTYLINE HCL 50 MG PO TABS
50.0000 mg | ORAL_TABLET | Freq: Every day | ORAL | Status: DC
  Administered 2019-07-06: 20:00:00 50 mg via ORAL
  Filled 2019-07-05 (×2): qty 1

## 2019-07-05 MED ORDER — IOHEXOL 300 MG/ML  SOLN
80.0000 mL | Freq: Once | INTRAMUSCULAR | Status: AC | PRN
  Administered 2019-07-05: 10:00:00 60 mL via INTRAVENOUS

## 2019-07-05 MED ORDER — METOPROLOL TARTRATE 25 MG PO TABS
12.5000 mg | ORAL_TABLET | Freq: Two times a day (BID) | ORAL | Status: DC
  Administered 2019-07-05 – 2019-07-07 (×4): 12.5 mg via ORAL
  Filled 2019-07-05 (×4): qty 1

## 2019-07-05 MED ORDER — ASPIRIN EC 81 MG PO TBEC
81.0000 mg | DELAYED_RELEASE_TABLET | Freq: Every day | ORAL | Status: DC
  Administered 2019-07-06 – 2019-07-07 (×2): 81 mg via ORAL
  Filled 2019-07-05 (×2): qty 1

## 2019-07-05 MED ORDER — ONDANSETRON HCL 4 MG PO TABS: 4.0000 mg | ORAL_TABLET | Freq: Four times a day (QID) | ORAL | Status: DC | PRN

## 2019-07-05 MED ORDER — CLOPIDOGREL BISULFATE 75 MG PO TABS
75.0000 mg | ORAL_TABLET | Freq: Every day | ORAL | Status: DC
  Administered 2019-07-06 – 2019-07-07 (×2): 75 mg via ORAL
  Filled 2019-07-05 (×3): qty 1

## 2019-07-05 MED ORDER — PROMETHAZINE HCL 25 MG/ML IJ SOLN
12.5000 mg | Freq: Once | INTRAMUSCULAR | Status: AC
  Administered 2019-07-05: 12:00:00 12.5 mg via INTRAVENOUS
  Filled 2019-07-05: qty 1

## 2019-07-05 MED ORDER — PROMETHAZINE HCL 12.5 MG RE SUPP
12.5000 mg | Freq: Four times a day (QID) | RECTAL | Status: DC | PRN
  Filled 2019-07-05: qty 2

## 2019-07-05 MED ORDER — ZOLPIDEM TARTRATE 5 MG PO TABS: 5.0000 mg | ORAL_TABLET | Freq: Every evening | ORAL | Status: DC | PRN

## 2019-07-05 MED ORDER — LAMIVUDINE 150 MG PO TABS
300.0000 mg | ORAL_TABLET | Freq: Every day | ORAL | Status: DC
  Administered 2019-07-06 – 2019-07-07 (×2): 300 mg via ORAL
  Filled 2019-07-05 (×3): qty 2

## 2019-07-05 MED ORDER — SODIUM CHLORIDE 0.9% FLUSH
3.0000 mL | Freq: Two times a day (BID) | INTRAVENOUS | Status: DC
  Administered 2019-07-06: 08:00:00 3 mL via INTRAVENOUS

## 2019-07-05 MED ORDER — ENOXAPARIN SODIUM 40 MG/0.4ML ~~LOC~~ SOLN
40.0000 mg | SUBCUTANEOUS | Status: DC
  Administered 2019-07-05 – 2019-07-07 (×3): 40 mg via SUBCUTANEOUS
  Filled 2019-07-05 (×3): qty 0.4

## 2019-07-05 MED ORDER — ACETAMINOPHEN 650 MG RE SUPP: 650.0000 mg | Freq: Four times a day (QID) | RECTAL | Status: DC | PRN

## 2019-07-05 MED ORDER — MORPHINE SULFATE (PF) 2 MG/ML IV SOLN
2.0000 mg | INTRAVENOUS | Status: DC | PRN
  Administered 2019-07-06 – 2019-07-07 (×2): 2 mg via INTRAVENOUS
  Filled 2019-07-05 (×3): qty 1

## 2019-07-05 MED ORDER — DOCUSATE SODIUM 100 MG PO CAPS
100.0000 mg | ORAL_CAPSULE | Freq: Two times a day (BID) | ORAL | Status: DC
  Administered 2019-07-06 – 2019-07-07 (×3): 100 mg via ORAL
  Filled 2019-07-05 (×4): qty 1

## 2019-07-05 MED ORDER — ONDANSETRON HCL 4 MG/2ML IJ SOLN
4.0000 mg | Freq: Once | INTRAMUSCULAR | Status: AC
  Administered 2019-07-05: 09:00:00 4 mg via INTRAVENOUS
  Filled 2019-07-05: qty 2

## 2019-07-05 MED ORDER — PIRFENIDONE 267 MG PO CAPS: 1068.0000 mg | ORAL_CAPSULE | Freq: Two times a day (BID) | ORAL | Status: DC

## 2019-07-05 MED ORDER — POLYETHYLENE GLYCOL 3350 17 G PO PACK
17.0000 g | PACK | Freq: Every day | ORAL | Status: DC | PRN
  Administered 2019-07-07: 17 g via ORAL
  Filled 2019-07-05: qty 1

## 2019-07-05 MED ORDER — PANTOPRAZOLE SODIUM 40 MG PO TBEC
40.0000 mg | DELAYED_RELEASE_TABLET | Freq: Every day | ORAL | Status: DC
  Administered 2019-07-06: 40 mg via ORAL
  Filled 2019-07-05 (×2): qty 1

## 2019-07-05 NOTE — Progress Notes (Signed)
Pt nauseous  and refused to take po meds for now. Reported to night RN to try daily scheduled meds again later.

## 2019-07-05 NOTE — H&P (Signed)
History and Physical    [name] [name] [id number] DOB: 1950/09/21 DOA: 07/04/2019  PCP:  [place] Consultants:  Jonnie Finner - pulmonology; Sabra Heck - HIV; Marvel Plan - cardiology; Newman Pies - GI Patient coming from: Home - lives with daughter; NOK: Daughter, [name], [phone]; she does not request that I call her  Chief Complaint: n/v, weakness  HPI: [name] [name] is a 69 y.o. female with medical history significant of pulmonary fibrosis; HTN; HLD; and HIV presenting with vomiting and weakness following 2nd COVID vaccine.  She reports vomiting non-stop since Wednesday.  She has been vomiting "every time I decided to eat something."  Anything she ate or drank caused vomiting. +sore throat.  Yesterday she had a temp of 100.  Symptoms all started after she received her COVID vaccine on Wednesday.  Last BM was Thursday and was normal.  No sick contacts.  No urinary symptoms.   ED Course:  Tried to get home but failed.  CD4 290, VL undetectable, takes HIV meds.  Intractable n/v, ?pyelo.  Symptoms after COVID vaccine.  ?esophagitis.  UA abnormal but no symptoms, given Rocephin.  Review of Systems: As per HPI; otherwise review of systems reviewed and negative.   Ambulatory Status:  Ambulates without assistance  COVID Vaccine Status:   Complete on 4/21  Past Medical History:  Diagnosis Date  . Asthma   . Bradycardia    Sinus bradycardia  . CAD (coronary artery disease)    Minimal by history, Question coronary artery spasm  . Colon polyps   . Dyslipidemia   . GERD (gastroesophageal reflux disease)    Esophageal stricture  . H/O: hysterectomy   . Hiatal hernia   . HIV positive ([place])    Needle stick 1993  . Hypertension   . Palpitations   . Peptic ulcer disease   . Pulmonary fibrosis (HCC)    on 2L home O2, prn at night  . Shortness of breath    Extensive evaluation Dr. Winona Legato, [facility], [place], [id number], etiology uncertain, probable bronchiolitis possibility of lung  biopsy at that time  . Weight loss     Past Surgical History:  Procedure Laterality Date  . APPENDECTOMY    . CARDIAC CATHETERIZATION N/A 03/21/2016   Procedure: Right/Left Heart Cath and Coronary Angiography;  Surgeon: Belva Crome, MD;  Location: [facility];  Service: Cardiovascular;  Laterality: N/A;  . CHOLECYSTECTOMY    . GASTRECTOMY    . VENTRAL HERNIA REPAIR      Social History   Socioeconomic History  . Marital status: Widowed    Spouse name: Not on file  . Number of children: 3  . Years of education: Not on file  . Highest education level: Not on file  Occupational History  . Occupation: retired    Fish farm manager: [employer]  Tobacco Use  . Smoking status: Former Smoker    Quit date: 03/13/1996    Years since quitting: 23.3  . Smokeless tobacco: Never Used  Substance and Sexual Activity  . Alcohol use: No  . Drug use: No  . Sexual activity: Not on file  Other Topics Concern  . Not on file  Social History Narrative  . Not on file   Social Determinants of Health   Financial Resource Strain:   . Difficulty of Paying Living Expenses:   Food Insecurity:   . Worried About Charity fundraiser in the Last Year:   . [place] in the  Last Year:   Transportation Needs:   . Film/video editor (Medical):   Marland Kitchen Lack of Transportation (Non-Medical):   Physical Activity:   . Days of Exercise per Week:   . Minutes of Exercise per Session:   Stress:   . Feeling of Stress :   Social Connections:   . Frequency of Communication with Friends and Family:   . Frequency of Social Gatherings with Friends and Family:   . Attends Religious Services:   . Active Member of Clubs or Organizations:   . Attends Archivist Meetings:   Marland Kitchen Marital Status:   Intimate Partner Violence:   . Fear of Current or Ex-Partner:   . Emotionally Abused:   Marland Kitchen Physically Abused:   . Sexually Abused:     Allergies  Allergen Reactions  .  Isosorbide Nitrate Other (See Comments)    Headaches  . Sulfamethoxazole-Trimethoprim Hives and Rash    Family History  Problem Relation Age of Onset  . Coronary artery disease Other        family hx of on mother, father and siblings  . Bradycardia Sister   . Diabetes Mother   . Heart attack Mother   . Heart disease Mother   . Hypertension Mother   . Stroke Mother   . Bradycardia Brother     Prior to Admission medications   Medication Sig Start Date End Date Taking? Authorizing Provider  albuterol (VENTOLIN HFA) 108 (90 Base) MCG/ACT inhaler Inhale 1-2 puffs into the lungs every 6 (six) hours as needed for wheezing or shortness of breath.    [provider]  amitriptyline (ELAVIL) 50 MG tablet Take 50 mg by mouth at bedtime.      [provider]  aspirin EC 81 MG tablet Take 1 tablet (81 mg total) by mouth daily. 03/17/16   Dorothy Spark, MD  atorvastatin (LIPITOR) 40 MG tablet Take 40 mg by mouth daily at 6 PM.     [provider]  budesonide-formoterol (SYMBICORT) 160-4.5 MCG/ACT inhaler Inhale 2 puffs into the lungs 2 (two) times daily.     [provider]  clopidogrel (PLAVIX) 75 MG tablet Take 75 mg by mouth daily.    [provider]  conjugated estrogens (PREMARIN) vaginal cream Place [id number] Applicatorfuls vaginally every Wednesday. Pt only to use 1/4 of the applicator     [provider]  Dolutegravir-lamiVUDine (DOVATO) 50-300 MG TABS Take 1 tablet by mouth daily.    [provider]  Ergocalciferol (VITAMIN D2) 50 MCG (2000 UT) TABS Take 2,000 Units by mouth daily.    [provider]  metoprolol tartrate (LOPRESSOR) 25 MG tablet Take 12.5 mg by mouth 2 (two) times daily. 12/19/17   [provider]  nitroGLYCERIN (NITROSTAT) 0.4 MG SL tablet Place 1 tablet (0.4 mg total) under the tongue every 5 (five) minutes as needed for chest pain. 03/21/16   Belva Crome, MD  omeprazole (PRILOSEC) 40 MG capsule  Take 40 mg by mouth 2 (two) times daily. 04/18/18   [provider]  ondansetron (ZOFRAN) 4 MG tablet Take 1 tablet (4 mg total) by mouth every 8 (eight) hours as needed for nausea or vomiting. 09/05/18 09/05/19  Radene Gunning, NP  Pirfenidone (ESBRIET) 267 MG CAPS Take 1,068 mg by mouth 2 (two) times daily.     [provider]  polyethylene glycol (MIRALAX / GLYCOLAX) packet Take 17 g by mouth daily as needed (constipation).     [provider]    Physical Exam: Vitals:   07/05/19 1210 07/05/19 1215 07/05/19 1230 07/05/19 1430  BP: 98/87  114/62 131/87  Pulse: 100 (!) 117 (!) 107 (!) 102  Resp: (!) 25 (!) 27 (!) 21 (!) 26  Temp:      TempSrc:      SpO2: 95% 96% 96% 93%     . General:  Appears calm and comfortable and is NAD; appears older than stated age . Eyes:  PERRL, EOMI, normal lids, iris . ENT:  grossly normal hearing, lips & tongue, mmm; appropriate dentition . Neck:  no LAD, masses or thyromegaly . Cardiovascular:  RR with mild tachycardia, no m/r/g. No LE edema.  Marland Kitchen Respiratory:   CTA bilaterally with no wheezes/rales/rhonchi.  Normal respiratory effort. . Abdomen:  soft, mild RUQ and midepigastric TTP, ND, NABS . Skin:  no rash or induration seen on limited exam . Musculoskeletal:  grossly normal tone BUE/BLE, good ROM, no bony abnormality . Psychiatric:  blunted mood and affect, speech fluent and appropriate, AOx3 . Neurologic:  CN 2-12 grossly intact, moves all extremities in coordinated fashion    Radiological Exams on Admission: CT Abdomen Pelvis W Contrast  Result Date: 07/05/2019 CLINICAL DATA:  Abdominal pain unspecified EXAM: CT ABDOMEN AND PELVIS WITH CONTRAST TECHNIQUE: Multidetector CT imaging of the abdomen and pelvis was performed using the standard protocol following bolus administration of intravenous contrast. CONTRAST:  80mL OMNIPAQUE IOHEXOL 300 MG/ML  SOLN COMPARISON:  09/04/2018, 04/21/2018 FINDINGS: Lower chest: Minimal basilar  atelectasis. No consolidation. No pleural effusion. Hepatobiliary: Liver is normal. No biliary ductal dilation beyond expected post cholecystectomy baseline. Portal vein is patent. Pancreas: Pancreas is unremarkable aside from atrophy, no ductal dilation or peripancreatic inflammation. Spleen: Spleen is normal size without focal lesion. Adrenals/Urinary Tract: Adrenal glands are normal. Smooth renal contours without hydronephrosis. Urinary bladder is normal. Stomach/Bowel: Moderately large hiatal hernia. Circumferential distal esophageal thickening partially imaged. Finding unchanged compared to the study of 04/21/2018. Large volume stool in the proximal and transverse colon. No acute gastrointestinal process. Vascular/Lymphatic: Vascular structures in the abdomen are patent. No adenopathy in the retroperitoneum. No adenopathy in the upper abdomen. No pelvic lymphadenopathy. Reproductive: Post hysterectomy. Other: No significant hernia. No signs of free air. No ascites. Musculoskeletal: Osteopenia. No acute or destructive bone finding. IMPRESSION: 1. Moderately large hiatal hernia. Circumferential distal esophageal thickening partially imaged. Finding unchanged compared to the study of 04/21/2018. Correlate with any clinical signs of esophagitis in the setting of hiatal hernia. Given the degree of circumferential thickening endoscopic follow-up may be warranted. Areas not well evaluated on the current exam. 2. Large volume stool in the proximal and transverse colon. 3. No acute findings in the abdomen or pelvis. Electronically Signed   By: Zetta Bills M.D.   On: 07/05/2019 10:50    EKG: pending   Labs on Admission: I have personally reviewed the available labs and imaging studies at the time of the admission.  Pertinent labs:   Glucose 140 BUN 13/Creatinine 1.04/GFR >60 Normal LFTs Normal CBC UA: small Hgb, + nitrite, 30 protein, many bacteria; UA is similar to E coli UTI on  6/24   Assessment/Plan Principal Problem:   Intractable nausea and vomiting Active Problems:   CAD (coronary artery disease)   Dyslipidemia   IPF (idiopathic pulmonary fibrosis) (HCC)   HIV (human immunodeficiency virus infection) ([place])   Intractable n/v -Patient with similar presentation with UTI in 08/2018; denies urinary symptoms, complains of refractory n/v since receiving her  2nd [facility] vaccine on 4/21 -She responded well to Ativan on prior admission so will try that -Imaging today indicates moderate amount of stool in the colon; constipation may be contributing -Overall, her labs and imaging appear benign at this time -Will observe on telemetry overnight given persistent tachycardia -Anticipate d/c to home tomorrow -Encourage a good bowel regimen  -Received one dose of Rocephin while in the ER and urine culture is pending; will not continue at this time  HIV -Continue Dovato -Quant <20 in 03/2019 -CD4 360 in 03/2019  IPF -Appears to be compensated at this time -She is being referred back to [facility] for repeat transplant evaluation -Continue Pirfenidone - she is on essentially max dosing per day at this time -She is on daytime [place] O2 but only wears qhs prn -Continue Symbicort (Loraine substitution), prn Albtuerol  HLD -Continue Lipitor  CAD -Continue ASA, Plavix -She is on weekly Premarin; caution advised  HTN -Continue Lopressor   Note: This patient has been tested and is pending for the novel coronavirus COVID-19.  DVT prophylaxis:   Lovenox  Code Status:  Full - confirmed with patient Family Communication: None present; she did not request that I speak with her daughter at the time of admission. Disposition Plan:  The patient is from: home  Anticipated d/c is to: home without [facility] services  Anticipated d/c date will depend on clinical response to treatment, but possibly as early as tomorrow if she has excellent response to treatment  Patient is  currently: acutely ill Consults called: None Admission status:  It is my clinical opinion that referral for OBSERVATION is reasonable and necessary in this patient based on the above information provided. The aforementioned taken together are felt to place the patient at high risk for further clinical deterioration. However it is anticipated that the patient may be medically stable for discharge from the hospital within 24 to 48 hours.    Karmen Bongo MD [facility]   How to contact the [facility] Attending or Consulting provider [place] or covering provider during after hours [place], for this patient?  1. Check the care team in [facility] and look for a) attending/consulting [facility] provider listed and b) the [facility] team listed 2. Log into [email or web address] and use [place]'s universal password to access. If you do not have the password, please contact the hospital operator. 3. Locate the [facility] provider you are looking for under [facility] and page to a number that you can be directly reached. 4. If you still have difficulty reaching the provider, please page the [facility] (Director on Call) for the Hospitalists listed on amion for assistance.   07/05/2019, 3:13 PM

## 2019-07-05 NOTE — Progress Notes (Signed)
Received from ED accompanied by Rn to [address] 16. Alert and pleasant.  Oriented to room set up.

## 2019-07-05 NOTE — ED Provider Notes (Signed)
[facility] EMERGENCY DEPARTMENT Provider Note   CSN: [id number] Arrival date & time: 07/04/19  2002     History Chief Complaint  Patient presents with  . Emesis    [name] [name] is a 69 y.o. female with a history of HIV on ART with undetectable viral load, GERD, hypertension, hyperlipidemia, CAD, PUD & prior cholecystectomy, appendectomy, gastrectomy, and ventral hernia repair who presents to the ED with complaints of N/V that started 4 days prior. Patient reports she has had too numerous to count episodes of emesis per day, she has been unable to keep anything down, some emesis has appeared dark but without frank blood. States she has subsequently started to feel weak, dehydrated, and very fatigued. Reports associated fever with temp of 100.0 and chills. Had 2nd dose of her COVID vaccine day of onset of sxs. Denies URI sxs, chest pain, dyspnea, cough, abdominal pain, melena, hematochezia, diarrhea, dysuria, frequency, or hematuria.   HPI     Past Medical History:  Diagnosis Date  . Asthma   . Bradycardia    Sinus bradycardia  . CAD (coronary artery disease)    Minimal by history, Question coronary artery spasm  . Colon polyps   . Dyslipidemia   . Ejection fraction   . GERD (gastroesophageal reflux disease)    Esophageal stricture  . H/O: hysterectomy   . Hiatal hernia   . HIV positive ([place])    Needle stick 1993  . Hyperlipidemia   . Hypertension   . Nausea & vomiting 08/2018  . Palpitations   . Peptic ulcer disease   . Pulmonary fibrosis ([place])   . Shortness of breath    Extensive evaluation Dr. Winona Legato, [facility], [place], [id number], etiology uncertain, probable bronchiolitis possibility of lung biopsy at that time  . Weight loss     Patient Active Problem List   Diagnosis Date Noted  . Nausea & vomiting 09/04/2018  . UTI (urinary tract infection) 09/04/2018  . Nausea and/or vomiting 09/04/2018  . Pulmonary fibrosis ([place])   . Influenza due to identified  novel influenza A virus with other respiratory manifestations 04/22/2018  . Hypokalemia 04/22/2018  . HIV (human immunodeficiency virus infection) ([place]) 04/21/2018  . Sepsis ([place]) 04/21/2018  . Abdominal pain 04/21/2018  . Asthma exacerbation 04/21/2018  . Anxiety state 03/17/2016  . Benign neoplasm of colon 03/17/2016  . Human immunodeficiency virus (HIV) disease ([place]) 03/17/2016  . Nontoxic uninodular goiter 03/17/2016  . Other and unspecified hyperlipidemia 03/17/2016  . Other premature beats 03/17/2016  . Pulmonary interstitial fibrosis ([place]) 03/17/2016  . Rectocele 03/17/2016  . Symptomatic bradycardia 03/17/2016  . Atypical chest pain 03/17/2016  . Lower extremity edema 10/13/2015  . Localized acrodermatitis continua of [place] 06/29/2015  . Cervical pain (neck) 06/14/2015  . Dysphagia 12/14/2014  . IPF (idiopathic pulmonary fibrosis) ([place]) 07/06/2014  . Paronychia 06/16/2014  . Bone pain 08/25/2013  . Lumbar radicular pain 08/25/2013  . Allergic rhinitis 07/14/2013  . Post-operative state 03/25/2013  . Preop cardiovascular exam 12/24/2012  . Ejection fraction   . Cystocele 12/11/2012  . Insomnia 10/23/2012  . Vaginal prolapse 04/02/2012  . Urinary incontinence 02/28/2012  . Other benign neoplasm of connective and other soft tissue of lower limb, including hip 09/08/2011  . Osteoporosis 03/20/2011  . Closed fracture of sacrum ([place]) 12/23/2010  . HIV positive ([place])   . CAD (coronary artery disease)   . Palpitations   . Dyslipidemia   . Persistent asthma   . Peptic ulcer  disease   . GERD (gastroesophageal reflux disease)   . Bradycardia   . Shortness of breath   . PULMONARY FIBROSIS 07/06/2008  . CONSTIPATION 07/22/2007  . WEIGHT LOSS-ABNORMAL 07/22/2007  . ABDOMINAL PAIN-GENERALIZED 07/22/2007    Past Surgical History:  Procedure Laterality Date  . APPENDECTOMY    . CARDIAC CATHETERIZATION N/A 03/21/2016   Procedure: Right/Left Heart Cath and Coronary  Angiography;  Surgeon: Belva Crome, MD;  Location: [place] [facility];  Service: Cardiovascular;  Laterality: N/A;  . CHOLECYSTECTOMY    . GASTRECTOMY    . VENTRAL HERNIA REPAIR       OB History   No obstetric history on file.     Family History  Problem Relation Age of Onset  . Coronary artery disease Other        family hx of on mother, father and siblings  . Bradycardia Sister   . Diabetes Mother   . Heart attack Mother   . Heart disease Mother   . Hypertension Mother   . Stroke Mother   . Bradycardia Brother     Social History   Tobacco Use  . Smoking status: Former Smoker    Quit date: 03/13/1996    Years since quitting: 23.3  . Smokeless tobacco: Never Used  Substance Use Topics  . Alcohol use: No  . Drug use: No    Home Medications Prior to Admission medications   Medication Sig Start Date End Date Taking? Authorizing Provider  albuterol (VENTOLIN HFA) 108 (90 Base) MCG/ACT inhaler Inhale 1-2 puffs into the lungs every 6 (six) hours as needed for wheezing or shortness of breath.    [provider]  amitriptyline (ELAVIL) 50 MG tablet Take 50 mg by mouth at bedtime.      [provider]  aspirin EC 81 MG tablet Take 1 tablet (81 mg total) by mouth daily. 03/17/16   Dorothy Spark, MD  atorvastatin (LIPITOR) 40 MG tablet Take 40 mg by mouth daily at 6 PM.     [provider]  budesonide-formoterol (SYMBICORT) 160-4.5 MCG/ACT inhaler Inhale 2 puffs into the lungs 2 (two) times daily.     [provider]  clopidogrel (PLAVIX) 75 MG tablet Take 75 mg by mouth daily.    [provider]  conjugated estrogens (PREMARIN) vaginal cream Place [id number] Applicatorfuls vaginally every Wednesday. Pt only to use 1/4 of the applicator     [provider]  Dolutegravir-lamiVUDine (DOVATO) 50-300 MG TABS Take 1 tablet by mouth daily.    [provider]  Ergocalciferol (VITAMIN D2) 50 MCG (2000 UT) TABS Take 2,000 Units by  mouth daily.    [provider]  metoprolol tartrate (LOPRESSOR) 25 MG tablet Take 12.5 mg by mouth 2 (two) times daily. 12/19/17   [provider]  nitroGLYCERIN (NITROSTAT) 0.4 MG SL tablet Place 1 tablet (0.4 mg total) under the tongue every 5 (five) minutes as needed for chest pain. 03/21/16   Belva Crome, MD  omeprazole (PRILOSEC) 40 MG capsule Take 40 mg by mouth 2 (two) times daily. 04/18/18   [provider]  ondansetron (ZOFRAN) 4 MG tablet Take 1 tablet (4 mg total) by mouth every 8 (eight) hours as needed for nausea or vomiting. 09/05/18 09/05/19  Radene Gunning, NP  Pirfenidone (ESBRIET) 267 MG CAPS Take 1,068 mg by mouth 2 (two) times daily.     [provider]  polyethylene glycol (MIRALAX / GLYCOLAX) packet Take 17 g by  mouth daily as needed (constipation).     [provider]    Allergies    Isosorbide nitrate and Sulfamethoxazole-trimethoprim  Review of Systems   Review of Systems  Constitutional: Positive for chills, fatigue and fever.  HENT: Negative for congestion and ear pain.   Respiratory: Negative for cough and shortness of breath.   Cardiovascular: Negative for chest pain and leg swelling.  Gastrointestinal: Positive for nausea and vomiting. Negative for abdominal pain, anal bleeding, blood in stool, constipation and diarrhea.  Genitourinary: Negative for dysuria and hematuria.  Musculoskeletal: Negative for arthralgias.  Neurological: Positive for weakness (generalized). Negative for syncope.  All other systems reviewed and are negative.   Physical Exam Updated Vital Signs BP 114/77 (BP Location: Left Arm)   Pulse (!) 129   Temp 99 F (37.2 C) (Oral)   Resp 14   SpO2 98%   Physical Exam Vitals and nursing note reviewed.  Constitutional:      General: She is not in acute distress.    Appearance: She is well-developed. She is not toxic-appearing.  HENT:     Head: Normocephalic and atraumatic.     Mouth/Throat:       Mouth: Mucous membranes are dry.  Eyes:     General:        Right eye: No discharge.        Left eye: No discharge.     Conjunctiva/sclera: Conjunctivae normal.  Cardiovascular:     Rate and Rhythm: Regular rhythm. Tachycardia present.  Pulmonary:     Effort: Pulmonary effort is normal. No respiratory distress.     Breath sounds: Normal breath sounds. No wheezing, rhonchi or rales.  Abdominal:     General: There is no distension.     Palpations: Abdomen is soft.     Tenderness: There is abdominal tenderness (RLQ, RUQ). There is no right CVA tenderness, left CVA tenderness, guarding or rebound.  Musculoskeletal:     Cervical back: Neck supple.  Skin:    General: Skin is warm and dry.     Findings: No rash.  Neurological:     Mental Status: She is alert.     Comments: Clear speech.   Psychiatric:        Behavior: Behavior normal.     ED Results / Procedures / Treatments   Labs (all labs ordered are listed, but only abnormal results are displayed) Labs Reviewed  COMPREHENSIVE METABOLIC PANEL - Abnormal; Notable for the following components:      Result Value   CO2 21 (*)    Glucose, Bld 140 (*)    Creatinine, Ser 1.04 (*)    GFR calc non Af Amer 56 (*)    All other components within normal limits  URINALYSIS, ROUTINE W REFLEX MICROSCOPIC - Abnormal; Notable for the following components:   Color, Urine AMBER (*)    APPearance CLOUDY (*)    Hgb urine dipstick SMALL (*)    Protein, ur 30 (*)    Nitrite POSITIVE (*)    Bacteria, UA MANY (*)    All other components within normal limits  LIPASE, BLOOD  CBC    EKG None  Radiology CT Abdomen Pelvis W Contrast  Result Date: 07/05/2019 CLINICAL DATA:  Abdominal pain unspecified EXAM: CT ABDOMEN AND PELVIS WITH CONTRAST TECHNIQUE: Multidetector CT imaging of the abdomen and pelvis was performed using the standard protocol following bolus administration of intravenous contrast. CONTRAST:  34mL OMNIPAQUE IOHEXOL 300  MG/ML  SOLN COMPARISON:  09/04/2018,  04/21/2018 FINDINGS: Lower chest: Minimal basilar atelectasis. No consolidation. No pleural effusion. Hepatobiliary: Liver is normal. No biliary ductal dilation beyond expected post cholecystectomy baseline. Portal vein is patent. Pancreas: Pancreas is unremarkable aside from atrophy, no ductal dilation or peripancreatic inflammation. Spleen: Spleen is normal size without focal lesion. Adrenals/Urinary Tract: Adrenal glands are normal. Smooth renal contours without hydronephrosis. Urinary bladder is normal. Stomach/Bowel: Moderately large hiatal hernia. Circumferential distal esophageal thickening partially imaged. Finding unchanged compared to the study of 04/21/2018. Large volume stool in the proximal and transverse colon. No acute gastrointestinal process. Vascular/Lymphatic: Vascular structures in the abdomen are patent. No adenopathy in the retroperitoneum. No adenopathy in the upper abdomen. No pelvic lymphadenopathy. Reproductive: Post hysterectomy. Other: No significant hernia. No signs of free air. No ascites. Musculoskeletal: Osteopenia. No acute or destructive bone finding. IMPRESSION: 1. Moderately large hiatal hernia. Circumferential distal esophageal thickening partially imaged. Finding unchanged compared to the study of 04/21/2018. Correlate with any clinical signs of esophagitis in the setting of hiatal hernia. Given the degree of circumferential thickening endoscopic follow-up may be warranted. Areas not well evaluated on the current exam. 2. Large volume stool in the proximal and transverse colon. 3. No acute findings in the abdomen or pelvis. Electronically Signed   By: Zetta Bills M.D.   On: 07/05/2019 10:50    Procedures Procedures (including critical care time)  Medications Ordered in ED Medications - No data to display  ED Course  I have reviewed the triage vital signs and the nursing notes.  Pertinent labs & imaging results that were  available during my care of the patient were reviewed by me and considered in my medical decision making (see chart for details).  [name] [name] was evaluated in Emergency Department on 07/05/2019 for the symptoms described in the history of present illness. He/she was evaluated in the context of the global COVID-19 pandemic, which necessitated consideration that the patient might be at risk for infection with the SARS-CoV-2 virus that causes COVID-19. Institutional protocols and algorithms that pertain to the evaluation of patients at risk for COVID-19 are in a state of rapid change based on information released by regulatory bodies including the CDC and federal and state organizations. These policies and algorithms were followed during the patient's care in the ED.    MDM Rules/Calculators/A&P                     This patient presents to the ED for concern of  N/V & generalized weakness. Nontoxic, tachycardic, vitals otherwise without significant abnormality.  Dry MM. RUQ/RLQ abdominal tenderness without peritoneal signs. DDX: Viral gastroenteritis, pancreatitis, GERD, PUD, UTI/pyelonephritis, nephrolithiasis, vaccine reaction.  Additional history obtained:  Additional history obtained from Chart review of previous records:  The most recent CD4+ count is:  T-Helper Lymphs  Date Value Ref Range Status  02/20/2019 0.24 (L) 0.31 - 3.11 X1000/uL Final  10/12/2014 0.59 0.31 - 3.11 X1000 Final  and viral load is:  HIV, ULTRAQUANT  Date Value Ref Range Status  10/12/2014 <20 c/ml Final  HIV RNA QUANTITATION  Date Value Ref Range Status  02/20/2019 <20 Target Not Detected copies/mL Final    Lab Tests:  I Ordered, reviewed, and interpreted labs, which included: CBC: No anemia or leukocytosis. CMP: Mild elevation in creatinine to 1.04 today, most recently 0.79.  Hyperglycemia.  No significant electrolyte derangement. Lipase: WNL Urinalysis: Consistent with infection, nitrite positive.  Patient  appears dehydrated- 1L fluid ordered. Zofran ordered for N/V. Will  start Rocephin for infected urine- sent for culture. Will obtain CT A/P and blood cultures.    Imaging Studies ordered:  I ordered imaging studies which included CT A/P I independently visualized and interpreted imaging which showed 1. Moderately large hiatal hernia. Circumferential distal esophageal thickening partially imaged. Finding unchanged compared to the study of 04/21/2018. Correlate with any clinical signs of esophagitis in the setting of hiatal hernia. Given the degree of circumferential thickening endoscopic follow-up may be warranted. Areas not well evaluated on the current exam--> patient states she is currently taking medication for this- no acute change. 2. Large volume stool in the proximal and transverse colon. 3. No acute findings in the abdomen or pelvis  Reevaluation:  11:50: RE-EVAL: After the interventions stated above, I reevaluated the patient and found that she was unable to tolerate PO. Phenergan ordered.   Some difficulty with IV access per nursing staff, IV team was consulted to establish access.   13:20: RE-EVAL: Patient with continued nausea & emesis s/p phenergan. She remains tachycardic and does not feel well. Will give additional anti-emetics and consult for admission at this time, patient is in agreement.   This is a shared visit with supervising physician Dr. Langston Masker who has independently evaluated patient & provided guidance in evaluation/management/disposition, in agreement with care   Consultations Obtained: 14:28: CONSULT: Discussed with hospitalist Dr. Lorin Mercy- accepts admission.    Portions of this note were generated with [employer]. Dictation errors may occur despite best attempts at proofreading.  Final Clinical Impression(s) / ED Diagnoses Final diagnoses:  Pyelonephritis  Intractable nausea and vomiting    Rx / DC Orders ED Discharge Orders    None        Amaryllis Dyke, PA-C 07/05/19 1438    Wyvonnia Dusky, MD 07/05/19 [phone]

## 2019-07-05 NOTE — Progress Notes (Signed)
Patient ID: [name] [name], female   DOB: 1951-03-10, 69 y.o.   MRN: [id number] Patient underwent CT abd/pelvis earlier today with reported small amount (about 10-15 cc)of contrast extravasation from previous left antecubital IV site.  On exam there is a small amount of ST edema at  IV site left antecubital fossa but area is soft and nontender, no skin breakdown, intact distal pulses, no reported paresthesias.  Recommend conservative management, ice pack to area as needed.  Monitor for any acute skin changes.

## 2019-07-05 NOTE — ED Notes (Signed)
PT was unable to tolerate fluid challenge

## 2019-07-05 NOTE — Progress Notes (Signed)
Pt advised to call family to bring home med of Pirfenidone (not available at [facility]).

## 2019-07-06 DIAGNOSIS — T8089XA Other complications following infusion, transfusion and therapeutic injection, initial encounter: Secondary | ICD-10-CM | POA: Diagnosis not present

## 2019-07-06 DIAGNOSIS — N390 Urinary tract infection, site not specified: Secondary | ICD-10-CM | POA: Diagnosis present

## 2019-07-06 DIAGNOSIS — E860 Dehydration: Secondary | ICD-10-CM | POA: Diagnosis present

## 2019-07-06 DIAGNOSIS — R112 Nausea with vomiting, unspecified: Secondary | ICD-10-CM | POA: Diagnosis present

## 2019-07-06 DIAGNOSIS — Z8711 Personal history of peptic ulcer disease: Secondary | ICD-10-CM | POA: Diagnosis not present

## 2019-07-06 DIAGNOSIS — R600 Localized edema: Secondary | ICD-10-CM | POA: Diagnosis not present

## 2019-07-06 DIAGNOSIS — Z20822 Contact with and (suspected) exposure to covid-19: Secondary | ICD-10-CM | POA: Diagnosis present

## 2019-07-06 DIAGNOSIS — Z823 Family history of stroke: Secondary | ICD-10-CM | POA: Diagnosis not present

## 2019-07-06 DIAGNOSIS — Z21 Asymptomatic human immunodeficiency virus [HIV] infection status: Secondary | ICD-10-CM | POA: Diagnosis present

## 2019-07-06 DIAGNOSIS — I2510 Atherosclerotic heart disease of native coronary artery without angina pectoris: Secondary | ICD-10-CM | POA: Diagnosis present

## 2019-07-06 DIAGNOSIS — Z888 Allergy status to other drugs, medicaments and biological substances status: Secondary | ICD-10-CM | POA: Diagnosis not present

## 2019-07-06 DIAGNOSIS — E785 Hyperlipidemia, unspecified: Secondary | ICD-10-CM | POA: Diagnosis present

## 2019-07-06 DIAGNOSIS — K2100 Gastro-esophageal reflux disease with esophagitis, without bleeding: Secondary | ICD-10-CM | POA: Diagnosis present

## 2019-07-06 DIAGNOSIS — Z7951 Long term (current) use of inhaled steroids: Secondary | ICD-10-CM | POA: Diagnosis not present

## 2019-07-06 DIAGNOSIS — J84112 Idiopathic pulmonary fibrosis: Secondary | ICD-10-CM | POA: Diagnosis present

## 2019-07-06 DIAGNOSIS — Z9049 Acquired absence of other specified parts of digestive tract: Secondary | ICD-10-CM | POA: Diagnosis not present

## 2019-07-06 DIAGNOSIS — K449 Diaphragmatic hernia without obstruction or gangrene: Secondary | ICD-10-CM | POA: Diagnosis present

## 2019-07-06 DIAGNOSIS — I10 Essential (primary) hypertension: Secondary | ICD-10-CM | POA: Diagnosis present

## 2019-07-06 DIAGNOSIS — Z79899 Other long term (current) drug therapy: Secondary | ICD-10-CM | POA: Diagnosis not present

## 2019-07-06 DIAGNOSIS — Z882 Allergy status to sulfonamides status: Secondary | ICD-10-CM | POA: Diagnosis not present

## 2019-07-06 DIAGNOSIS — Z7982 Long term (current) use of aspirin: Secondary | ICD-10-CM | POA: Diagnosis not present

## 2019-07-06 DIAGNOSIS — Z7902 Long term (current) use of antithrombotics/antiplatelets: Secondary | ICD-10-CM | POA: Diagnosis not present

## 2019-07-06 DIAGNOSIS — Z833 Family history of diabetes mellitus: Secondary | ICD-10-CM | POA: Diagnosis not present

## 2019-07-06 DIAGNOSIS — Z8249 Family history of ischemic heart disease and other diseases of the circulatory system: Secondary | ICD-10-CM | POA: Diagnosis not present

## 2019-07-06 LAB — BASIC METABOLIC PANEL
Anion gap: 8 (ref 5–15)
BUN: 12 mg/dL (ref 8–23)
CO2: 21 mmol/L — ABNORMAL LOW (ref 22–32)
Calcium: 8.2 mg/dL — ABNORMAL LOW (ref 8.9–10.3)
Chloride: 115 mmol/L — ABNORMAL HIGH (ref 98–111)
Creatinine, Ser: 0.69 mg/dL (ref 0.44–1.00)
GFR calc Af Amer: >60 mL/min (ref >60)
GFR calc non Af Amer: >60 mL/min (ref >60)
Glucose, Bld: 89 mg/dL (ref 70–99)
Potassium: 3.6 mmol/L (ref 3.5–5.1)
Sodium: 144 mmol/L (ref 135–145)

## 2019-07-06 LAB — URINE CULTURE

## 2019-07-06 LAB — CBC
HCT: 35.1 % — ABNORMAL LOW (ref 36.0–46.0)
Hemoglobin: 10.9 g/dL — ABNORMAL LOW (ref 12.0–15.0)
MCH: 30 pg (ref 26.0–34.0)
MCHC: 31.1 g/dL (ref 30.0–36.0)
MCV: 96.7 fL (ref 80.0–100.0)
Platelets: 250 10*3/uL (ref 150–400)
RBC: 3.63 MIL/uL — ABNORMAL LOW (ref 3.87–5.11)
RDW: 14.6 % (ref 11.5–15.5)
WBC: 8.4 10*3/uL (ref 4.0–10.5)
nRBC: 0 % (ref 0.0–0.2)

## 2019-07-06 MED ORDER — PANTOPRAZOLE SODIUM 40 MG IV SOLR
40.0000 mg | Freq: Two times a day (BID) | INTRAVENOUS | Status: DC
  Administered 2019-07-06 – 2019-07-07 (×3): 40 mg via INTRAVENOUS
  Filled 2019-07-06 (×3): qty 40

## 2019-07-06 NOTE — Progress Notes (Signed)
Tap water enema done, pt had a medium brown lump stool. Pt tolerated well. Perineal care done.

## 2019-07-06 NOTE — Progress Notes (Signed)
PROGRESS NOTE  [name] WRIGHT [id number] DOB: [birth year] DOA: 07/04/2019 PCP: [facility]  HPI/Recap of past 24 hours: [name] [name] is a 69 y.o. female with medical history significant of pulmonary fibrosis; HTN; HLD; and HIV presenting with vomiting and weakness following 2nd COVID vaccine.  She reports vomiting non-stop since Wednesday.  She has been vomiting "every time I decided to eat something."  Anything she ate or drank caused vomiting. +sore throat.  Yesterday she had a temp of 100.  Symptoms all started after she received her COVID vaccine on Wednesday.  Last BM was Thursday and was normal.  No sick contacts.  No urinary symptoms.   ED Course:  Tried to get home but failed.  CD4 290, VL undetectable, takes HIV meds.  Intractable n/v, ?pyelo.  Symptoms after COVID vaccine.  ?esophagitis.  UA abnormal but no symptoms, urine culture multiple species present, suggest recollection.  07/06/19: Seen and examined.  Reports persistent nausea with no vomiting this morning.  Associated with lower abdominal discomfort.  Poor oral intake.  Did not eat dinner or breakfast this morning.  Assessment/Plan: Principal Problem:   Intractable nausea and vomiting Active Problems:   CAD (coronary artery disease)   Dyslipidemia   IPF (idiopathic pulmonary fibrosis) (HCC)   HIV (human immunodeficiency virus infection) (HCC)  Intractable nausea/vomiting, unclear etiology Presented with nausea and vomiting since receiving her second dose of Pfizer Covid vaccine COVID-19 negative CT abdomen pelvis with contrast showed large volume stool in the proximal and transverse colon, circumferential distal esophageal thickening, finding unchanged compared to the study in 2020 possible esophagitis in the setting of hiatal hernia Start IV Protonix 40 mg twice daily Persistent nausea this morning requiring IV antiemetics Poor oral intake On IV fluid hydration, continue Encourage oral intake  as tolerated Received 1 dose of Rocephin in the ED due to pyuria, urine culture multiple species, suggestive of recollection   HIV Stable Compliant Continue home medication  IPF -Stable -She is being referred back to [facility] for [facility] evaluation -Continue Pirfenidone - she is on essentially max dosing per day at this time -She is on daytime [place] O2 but only wears qhs prn -Continue Symbicort ([employer] formulary substitution), prn Albtuerol  HLD -Continue Lipitor  CAD -Continue ASA, Plavix -She is on weekly Premarin; caution advised  HTN -Continue Lopressor     DVT prophylaxis:   Lovenox  Code Status:  Full - confirmed with patient Family Communication: None present; she did not request that I speak with her daughter at the time of admission. Disposition Plan:The patient is from: home             Anticipated d/c is to: home without [facility] services             Anticipated d/c date will depend on clinical response to treatment, but possibly as early as tomorrow if she has excellent response to treatment             Patient is currently: acutely ill Consults called: None    Objective: Vitals:   07/05/19 2121 07/06/19 0143 07/06/19 0529 07/06/19 0803  BP:  (!) 105/54 (!) 99/53 113/60  Pulse:  74 70 99  Resp:  16    Temp:  98.3 F (36.8 C) 98.3 F (36.8 C)   TempSrc:  Oral    SpO2: 98% 100% 100%   Weight:      Height:        Intake/Output Summary (Last  24 hours) at 07/06/2019 1304 Last data filed at 07/06/2019 0900 Gross per 24 hour  Intake 1148.28 ml  Output 600 ml  Net 548.28 ml   Filed Weights   07/05/19 1836 07/05/19 1842  Weight: 63.5 kg 63.5 kg    Exam:  . General: 69 y.o. year-old female well developed well nourished in no acute distress.  Alert and oriented x3. . Cardiovascular: Regular rate and rhythm with no rubs or gallops.  No thyromegaly or JVD noted.   Marland Kitchen Respiratory: Clear to auscultation with no wheezes or rales. Good inspiratory  effort. . Abdomen: Soft mild discomfort lower quadrants with bowel sounds present. . Musculoskeletal: No lower extremity edema bilaterally.   Marland Kitchen Psychiatry: Mood is appropriate for condition and setting   Data Reviewed: CBC: Recent Labs  Lab 07/04/19 2033 07/06/19 0322  WBC 9.7 8.4  HGB 13.4 10.9*  HCT 43.2 35.1*  MCV 96.0 96.7  PLT 347 [id number]   Basic Metabolic Panel: Recent Labs  Lab 07/04/19 2033 07/06/19 0322  NA 139 144  K 3.6 3.6  CL 109 115*  CO2 21* 21*  GLUCOSE 140* 89  BUN 13 12  CREATININE 1.04* 0.69  CALCIUM 9.0 8.2*   GFR: Estimated Creatinine Clearance: 55.3 mL/min (by C-G formula based on SCr of 0.69 mg/dL). Liver Function Tests: Recent Labs  Lab 07/04/19 2033  AST 19  ALT 17  ALKPHOS 106  BILITOT 0.7  PROT 7.0  ALBUMIN 3.7   Recent Labs  Lab 07/04/19 2033  LIPASE 29   No results for input(s): AMMONIA in the last 168 hours. Coagulation Profile: No results for input(s): INR, PROTIME in the last 168 hours. Cardiac Enzymes: No results for input(s): CKTOTAL, CKMB, CKMBINDEX, TROPONINI in the last 168 hours. BNP (last 3 results) No results for input(s): PROBNP in the last 8760 hours. HbA1C: No results for input(s): HGBA1C in the last 72 hours. CBG: No results for input(s): GLUCAP in the last 168 hours. Lipid Profile: No results for input(s): CHOL, HDL, LDLCALC, TRIG, CHOLHDL, LDLDIRECT in the last 72 hours. Thyroid Function Tests: No results for input(s): TSH, T4TOTAL, FREET4, T3FREE, THYROIDAB in the last 72 hours. Anemia Panel: No results for input(s): VITAMINB12, FOLATE, FERRITIN, TIBC, IRON, RETICCTPCT in the last 72 hours. Urine analysis:    Component Value Date/Time   COLORURINE AMBER (A) 07/04/2019 2030   APPEARANCEUR CLOUDY (A) 07/04/2019 2030   LABSPEC 1.026 07/04/2019 2030   [place] 5.0 07/04/2019 2030   GLUCOSEU NEGATIVE 07/04/2019 2030   HGBUR SMALL (A) 07/04/2019 2030   BILIRUBINUR NEGATIVE 07/04/2019 2030   [place] 07/04/2019 2030   PROTEINUR 30 (A) 07/04/2019 2030   UROBILINOGEN 4.0 (H) 07/29/2009 0740   NITRITE POSITIVE (A) 07/04/2019 2030   LEUKOCYTESUR NEGATIVE 07/04/2019 2030   Sepsis Labs: @LABRCNTIP (procalcitonin:4,lacticidven:4)  ) Recent Results (from the past 240 hour(s))  Urine culture     Status: Abnormal   Collection Time: 07/04/19  8:30 PM   Specimen: Urine, Random  Result Value Ref Range Status   Specimen Description URINE, RANDOM  Final   Special Requests   Final    NONE Performed at [facility], [address] [place], [place] [address]    Culture MULTIPLE SPECIES PRESENT, SUGGEST RECOLLECTION (A)  Final   Report Status 07/06/2019 FINAL  Final  SARS CORONAVIRUS 2 (TAT 6-24 HRS) Nasopharyngeal Nasopharyngeal Swab     Status: None   Collection Time: 07/05/19  4:45 PM   Specimen: Nasopharyngeal Swab  Result Value Ref Range Status   SARS Coronavirus 2 NEGATIVE NEGATIVE Final    Comment: (NOTE) SARS-CoV-2 target nucleic acids are NOT DETECTED. The SARS-CoV-2 RNA is generally detectable in upper and lower respiratory specimens during the acute phase of infection. Negative results do not preclude SARS-CoV-2 infection, do not rule out co-infections with other pathogens, and should not be used as the sole basis for treatment or other patient management decisions. Negative results must be combined with clinical observations, patient history, and epidemiological information. The expected result is Negative. Fact Sheet for Patients: SugarRoll.be Fact Sheet for Healthcare Providers: https://www.woods-mathews.com/ This test is not yet approved or cleared by the [place] FDA and  has been authorized for detection and/or diagnosis of SARS-CoV-2 by FDA under an Emergency Use Authorization (EUA). This EUA will remain  in effect (meaning this test can be used) for the duration of the COVID-19 declaration under Section 56  4(b)(1) of the Act, 21 U.S.C. section 360bbb-3(b)(1), unless the authorization is terminated or revoked sooner. Performed at [facility], [place] [address], [place], [place] [address]       Studies: No results found.  Scheduled Meds: . amitriptyline  50 mg Oral QHS  . aspirin EC  81 mg Oral Daily  . atorvastatin  40 mg Oral q1800  . capsaicin   Topical BID  . clopidogrel  75 mg Oral Daily  . docusate sodium  100 mg Oral BID  . dolutegravir  50 mg Oral Daily   And  . lamiVUDine  300 mg Oral Daily  . enoxaparin (LOVENOX) injection  40 mg Subcutaneous Q24H  . LORazepam  1 mg Intravenous Once  . metoprolol tartrate  12.5 mg Oral BID  . mometasone-formoterol  2 puff Inhalation BID  . pantoprazole  40 mg Oral Daily  . Pirfenidone  1,068 mg Oral BID  . prochlorperazine  5 mg Intravenous Once  . sodium chloride flush  3 mL Intravenous Q12H    Continuous Infusions: . lactated ringers 100 mL/hr at 07/06/19 0600     LOS: 0 days     Kayleen Memos, MD [facility] Pager [phone]  If 7PM-7AM, please contact night-coverage [email or web address] Password [facility] 07/06/2019, 1:04 PM

## 2019-07-07 ENCOUNTER — Encounter (HOSPITAL_COMMUNITY): Payer: Self-pay | Admitting: Internal Medicine

## 2019-07-07 LAB — COMPREHENSIVE METABOLIC PANEL
ALT: 14 U/L (ref 0–44)
AST: 18 U/L (ref 15–41)
Albumin: 2.7 g/dL — ABNORMAL LOW (ref 3.5–5.0)
Alkaline Phosphatase: 86 U/L (ref 38–126)
Anion gap: 8 (ref 5–15)
BUN: 6 mg/dL — ABNORMAL LOW (ref 8–23)
CO2: 24 mmol/L (ref 22–32)
Calcium: 8.3 mg/dL — ABNORMAL LOW (ref 8.9–10.3)
Chloride: 107 mmol/L (ref 98–111)
Creatinine, Ser: 0.72 mg/dL (ref 0.44–1.00)
GFR calc Af Amer: >60 mL/min (ref >60)
GFR calc non Af Amer: >60 mL/min (ref >60)
Glucose, Bld: 77 mg/dL (ref 70–99)
Potassium: 3.8 mmol/L (ref 3.5–5.1)
Sodium: 139 mmol/L (ref 135–145)
Total Bilirubin: 0.7 mg/dL (ref 0.3–1.2)
Total Protein: 5.5 g/dL — ABNORMAL LOW (ref 6.5–8.1)

## 2019-07-07 LAB — CBC WITH DIFFERENTIAL/PLATELET
Abs Immature Granulocytes: 0.05 10*3/uL (ref 0.00–0.07)
Basophils Absolute: 0 10*3/uL (ref 0.0–0.1)
Basophils Relative: 0 %
Eosinophils Absolute: 0.1 10*3/uL (ref 0.0–0.5)
Eosinophils Relative: 1 %
HCT: 36.1 % (ref 36.0–46.0)
Hemoglobin: 11.4 g/dL — ABNORMAL LOW (ref 12.0–15.0)
Immature Granulocytes: 1 %
Lymphocytes Relative: 14 %
Lymphs Abs: 1.1 10*3/uL (ref 0.7–4.0)
MCH: 30 pg (ref 26.0–34.0)
MCHC: 31.6 g/dL (ref 30.0–36.0)
MCV: 95 fL (ref 80.0–100.0)
Monocytes Absolute: 0.7 10*3/uL (ref 0.1–1.0)
Monocytes Relative: 9 %
Neutro Abs: 5.7 10*3/uL (ref 1.7–7.7)
Neutrophils Relative %: 75 %
Platelets: 228 10*3/uL (ref 150–400)
RBC: 3.8 MIL/uL — ABNORMAL LOW (ref 3.87–5.11)
RDW: 14.2 % (ref 11.5–15.5)
WBC: 7.6 10*3/uL (ref 4.0–10.5)
nRBC: 0 % (ref 0.0–0.2)

## 2019-07-07 LAB — PHOSPHORUS: Phosphorus: 2.8 mg/dL (ref 2.5–4.6)

## 2019-07-07 LAB — MAGNESIUM: Magnesium: 1.9 mg/dL (ref 1.7–2.4)

## 2019-07-07 MED ORDER — OMEPRAZOLE 40 MG PO CPDR: 40.0000 mg | DELAYED_RELEASE_CAPSULE | Freq: Two times a day (BID) | ORAL | 0 refills | Status: DC

## 2019-07-07 NOTE — Discharge Instructions (Signed)
Nausea and Vomiting, Adult Nausea is feeling sick to your stomach or feeling that you are about to throw up (vomit). Vomiting is when food in your stomach is thrown up and out of the mouth. Throwing up can make you feel weak. It can also make you lose too much water in your body (get dehydrated). If you lose too much water in your body, you may:  Feel tired.  Feel thirsty.  Have a dry mouth.  Have cracked lips.  Go pee (urinate) less often. Older adults and people with other diseases or a weak body defense system (immune system) are at higher risk for losing too much water in the body. If you feel sick to your stomach and you throw up, it is important to follow instructions from your doctor about how to take care of yourself. Follow these instructions at home: Watch your symptoms for any changes. Tell your doctor about them. Follow these instructions to care for yourself at home. Eating and drinking      Take an ORS (oral rehydration solution). This is a drink that is sold at pharmacies and stores.  Drink clear fluids in small amounts as you are able, such as: ? Water. ? Ice chips. ? Fruit juice that has water added (diluted fruit juice). ? Low-calorie sports drinks.  Eat bland, easy-to-digest foods in small amounts as you are able, such as: ? Bananas. ? Applesauce. ? Rice. ? Low-fat (lean) meats. ? Toast. ? Crackers.  Avoid drinking fluids that have a lot of sugar or caffeine in them. This includes energy drinks, sports drinks, and soda.  Avoid alcohol.  Avoid spicy or fatty foods. General instructions  Take over-the-counter and prescription medicines only as told by your doctor.  Drink enough fluid to keep your pee (urine) pale yellow.  Wash your hands often with soap and water. If you cannot use soap and water, use hand sanitizer.  Make sure that all people in your home wash their hands well and often.  Rest at home while you get better.  Watch your condition  for any changes.  Take slow and deep breaths when you feel sick to your stomach.  Keep all follow-up visits as told by your doctor. This is important. Contact a doctor if:  Your symptoms get worse.  You have new symptoms.  You have a fever.  You cannot drink fluids without throwing up.  You feel sick to your stomach for more than 2 days.  You feel light-headed or dizzy.  You have a headache.  You have muscle cramps.  You have a rash.  You have pain while peeing. Get help right away if:  You have pain in your chest, neck, arm, or jaw.  You feel very weak or you pass out (faint).  You throw up again and again.  You have throw up that is bright red or looks like black coffee grounds.  You have bloody or black poop (stools) or poop that looks like tar.  You have a very bad headache, a stiff neck, or both.  You have very bad pain, cramping, or bloating in your belly (abdomen).  You have trouble breathing.  You are breathing very quickly.  Your heart is beating very quickly.  Your skin feels cold and clammy.  You feel confused.  You have signs of losing too much water in your body, such as: ? Dark pee, very little pee, or no pee. ? Cracked lips. ? Dry mouth. ? Sunken eyes. ?  Sleepiness. ? Weakness. These symptoms may be an emergency. Do not wait to see if the symptoms will go away. Get medical help right away. Call your local emergency services (911 in the [place] Do not drive yourself to the hospital. Summary  Nausea is feeling sick to your stomach or feeling that you are about to throw up (vomit). Vomiting is when food in your stomach is thrown up and out of the mouth.  Follow instructions from your doctor about eating and drinking to keep from losing too much water in your body.  Take over-the-counter and prescription medicines only as told by your doctor.  Contact your doctor if your symptoms get worse or you have new symptoms.  Keep all follow-up  visits as told by your doctor. This is important. This information is not intended to replace advice given to you by your health care provider. Make sure you discuss any questions you have with your health care provider. Document Revised: 06/21/2018 Document Reviewed: 08/07/2017 Elsevier Patient Education  2020 [employer]  

## 2019-07-07 NOTE — Plan of Care (Signed)
°  Problem: Education: °Goal: Knowledge of General Education information will improve °Description: Including pain rating scale, medication(s)/side effects and non-pharmacologic comfort measures °Outcome: Progressing °  °Problem: Health Behavior/Discharge Planning: °Goal: Ability to manage health-related needs will improve °Outcome: Progressing °  °Problem: Clinical Measurements: °Goal: Ability to maintain clinical measurements within normal limits will improve °Outcome: Progressing °Goal: Respiratory complications will improve °Outcome: Progressing °Goal: Cardiovascular complication will be avoided °Outcome: Progressing °  °Problem: Activity: °Goal: Risk for activity intolerance will decrease °Outcome: Progressing °  °Problem: Nutrition: °Goal: Adequate nutrition will be maintained °Outcome: Progressing °  °Problem: Elimination: °Goal: Will not experience complications related to bowel motility °Outcome: Progressing °Goal: Will not experience complications related to urinary retention °Outcome: Progressing °  °Problem: Pain Managment: °Goal: General experience of comfort will improve °Outcome: Progressing °  °Problem: Safety: °Goal: Ability to remain free from injury will improve °Outcome: Progressing °  °Problem: Skin Integrity: °Goal: Risk for impaired skin integrity will decrease °Outcome: Progressing °  °

## 2019-07-07 NOTE — Discharge Summary (Signed)
Discharge Summary  [name] [name] [id number] DOB: 1950-05-15  PCP: [place] date: 07/04/2019 Discharge date: 07/07/2019  Time spent: 35 minutes  Recommendations for Outpatient Follow-up:  1. Follow-up with your PCP 2. Take your medications as prescribed 3. Avoid dehydration.  Discharge Diagnoses:  Active Hospital Problems   Diagnosis Date Noted  . Intractable nausea and vomiting 07/05/2019  . HIV (human immunodeficiency virus infection) ([place]) 04/21/2018  . IPF (idiopathic pulmonary fibrosis) ([facility] [facility]) 07/06/2014  . CAD (coronary artery disease)   . Dyslipidemia     Resolved Hospital Problems  No resolved problems to display.    Discharge Condition: Stable  Diet recommendation: Resume previous diet  Vitals:   07/07/19 0400 07/07/19 0749  BP: (!) 134/59   Pulse: 62 63  Resp: 18 18  Temp: 98.7 F (37.1 C)   SpO2: 98% 98%    History of present illness:  [name] [name] a 69 y.o.femalewith medical history significant ofpulmonary fibrosis; HTN; HLD; and HIV presenting with vomiting and weakness following 2nd COVID vaccine.She reports vomiting non-stop since Wednesday. She has been vomiting "every time I decided to eat something." Anything she ate or drank caused vomiting. +sore throat. Yesterday she had a temp of 100. Symptoms all started after she received her COVID vaccine on Wednesday. Last BM was Thursday and was normal. No sick contacts. No urinary symptoms.   ED Course:Tried to get home but failed. CD4 290, VL undetectable, takes HIV meds. Intractable n/v, ?pyelo. Symptoms after COVID vaccine. ?esophagitis. UA abnormal but no symptoms, urine culture multiple species present, suggest recollection.  07/07/19: Seen and examined.  Had a bowel movement yesterday after enema.  Tolerating a diet.  Vital signs and labs reviewed and are stable.   Hospital Course:  Principal Problem:   Intractable nausea and vomiting Active  Problems:   CAD (coronary artery disease)   Dyslipidemia   IPF (idiopathic pulmonary fibrosis) ([facility])   HIV (human immunodeficiency virus infection) ([facility])  Intractable nausea/vomiting, unclear etiology Presented with nausea and vomiting since receiving her second dose of Pfizer Covid vaccine COVID-19 negative CT abdomen pelvis with contrast showed large volume stool in the proximal and transverse colon, circumferential distal esophageal thickening, finding unchanged compared to the study in 2020 possible esophagitis in the setting of hiatal hernia Received IV Protonix 40 mg twice daily Continue omeprazole 40 mg twice daily Follow-up with your GI or PCP Tolerating a solid diet. Received 1 dose of Rocephin in the ED due to pyuria, urine culture multiple species, suggestive of recollection, denies dysuria or polyuria.  HIV Stable She is compliant with her medications. Follow-up with infectious disease  IPF -Stable -Continue home regimen -Follow-up with your pulmonologist  HLD -Continue Lipitor  CAD -Resume home medications -Continue aspirin, Plavix and Lipitor  HTN -Continue Lopressor 12.5 mg twice daily      Code Status:Full - confirmed with patient  Consults called:None   Discharge Exam: BP (!) 134/59 (BP Location: Left Arm)   Pulse 63   Temp 98.7 F (37.1 C) (Oral)   Resp 18   Ht 4\' 11"  (1.499 m)   Wt 63.5 kg   SpO2 98%   BMI 28.28 kg/m  . General: 69 y.o. year-old female well-developed well-nourished in no distress.  Alert and oriented x3. . Cardiovascular: Regular rate and rhythm no rubs or gallops.   Marland Kitchen Respiratory: Clear to auscultation no wheezes or rales.   . Abdomen: Soft nondistended with normal bowel sounds x4 quadrants. Marland Kitchen  Musculoskeletal: No lower extremity edema bilaterally. Marland Kitchen Psychiatry: Mood is appropriate for condition and setting  Discharge Instructions You were cared for by a hospitalist during your hospital stay. If you have  any questions about your discharge medications or the care you received while you were in the hospital after you are discharged, you can call the unit and asked to speak with the hospitalist on call if the hospitalist that took care of you is not available. Once you are discharged, your primary care physician will handle any further medical issues. Please note that NO REFILLS for any discharge medications will be authorized once you are discharged, as it is imperative that you return to your primary care physician (or establish a relationship with a primary care physician if you do not have one) for your aftercare needs so that they can reassess your need for medications and monitor your lab values.   Allergies as of 07/07/2019      Reactions   Isosorbide Nitrate Other (See Comments)   Headaches   Sulfamethoxazole-trimethoprim Hives, Rash      Medication List    TAKE these medications   albuterol 108 (90 Base) MCG/ACT inhaler Commonly known as: VENTOLIN HFA Inhale 1-2 puffs into the lungs every 6 (six) hours as needed for wheezing or shortness of breath.   amitriptyline 50 MG tablet Commonly known as: ELAVIL Take 50 mg by mouth at bedtime.   aspirin EC 81 MG tablet Take 1 tablet (81 mg total) by mouth daily.   atorvastatin 40 MG tablet Commonly known as: LIPITOR Take 40 mg by mouth daily at 6 PM.   clopidogrel 75 MG tablet Commonly known as: PLAVIX Take 75 mg by mouth daily.   conjugated estrogens vaginal cream Commonly known as: PREMARIN Place [id number] Applicatorfuls vaginally every Wednesday. Pt only to use 1/4 of the applicator   Dovato [id number] MG Tabs Generic drug: Dolutegravir-lamiVUDine Take 1 tablet by mouth daily.   Esbriet 267 MG Caps Generic drug: Pirfenidone Take 1,068 mg by mouth 2 (two) times daily.   metoprolol tartrate 25 MG tablet Commonly known as: LOPRESSOR Take 12.5 mg by mouth 2 (two) times daily.   nitroGLYCERIN 0.4 MG SL tablet Commonly known as:  NITROSTAT Place 1 tablet (0.4 mg total) under the tongue every 5 (five) minutes as needed for chest pain.   omeprazole 40 MG capsule Commonly known as: PRILOSEC Take 1 capsule (40 mg total) by mouth 2 (two) times daily.   polyethylene glycol 17 g packet Commonly known as: MIRALAX / GLYCOLAX Take 17 g by mouth daily as needed (constipation).   Symbicort 160-4.5 MCG/ACT inhaler Generic drug: budesonide-formoterol Inhale 2 puffs into the lungs 2 (two) times daily.   Vitamin D2 50 MCG (2000 UT) Tabs Take 2,000 Units by mouth daily.      Allergies  Allergen Reactions  . Isosorbide Nitrate Other (See Comments)    Headaches  . Sulfamethoxazole-Trimethoprim Hives and Rash   Follow-up Information    [place]. Call in 1 day(s).   Why: Please call for a post hospital follow-up appointment. Contact information: [place] [address] [phone]            The results of significant diagnostics from this hospitalization (including imaging, microbiology, ancillary and laboratory) are listed below for reference.    Significant Diagnostic Studies: CT Abdomen Pelvis W Contrast  Result Date: 07/05/2019 CLINICAL DATA:  Abdominal pain unspecified EXAM: CT ABDOMEN AND PELVIS WITH CONTRAST  TECHNIQUE: Multidetector CT imaging of the abdomen and pelvis was performed using the standard protocol following bolus administration of intravenous contrast. CONTRAST:  48mL OMNIPAQUE IOHEXOL 300 MG/ML  SOLN COMPARISON:  09/04/2018, 04/21/2018 FINDINGS: Lower chest: Minimal basilar atelectasis. No consolidation. No pleural effusion. Hepatobiliary: Liver is normal. No biliary ductal dilation beyond expected post cholecystectomy baseline. Portal vein is patent. Pancreas: Pancreas is unremarkable aside from atrophy, no ductal dilation or peripancreatic inflammation. Spleen: Spleen is normal size without focal lesion. Adrenals/Urinary Tract: Adrenal glands are  normal. Smooth renal contours without hydronephrosis. Urinary bladder is normal. Stomach/Bowel: Moderately large hiatal hernia. Circumferential distal esophageal thickening partially imaged. Finding unchanged compared to the study of 04/21/2018. Large volume stool in the proximal and transverse colon. No acute gastrointestinal process. Vascular/Lymphatic: Vascular structures in the abdomen are patent. No adenopathy in the retroperitoneum. No adenopathy in the upper abdomen. No pelvic lymphadenopathy. Reproductive: Post hysterectomy. Other: No significant hernia. No signs of free air. No ascites. Musculoskeletal: Osteopenia. No acute or destructive bone finding. IMPRESSION: 1. Moderately large hiatal hernia. Circumferential distal esophageal thickening partially imaged. Finding unchanged compared to the study of 04/21/2018. Correlate with any clinical signs of esophagitis in the setting of hiatal hernia. Given the degree of circumferential thickening endoscopic follow-up may be warranted. Areas not well evaluated on the current exam. 2. Large volume stool in the proximal and transverse colon. 3. No acute findings in the abdomen or pelvis. Electronically Signed   By: Zetta Bills M.D.   On: 07/05/2019 10:50    Microbiology: Recent Results (from the past 240 hour(s))  Urine culture     Status: Abnormal   Collection Time: 07/04/19  8:30 PM   Specimen: Urine, Random  Result Value Ref Range Status   Specimen Description URINE, RANDOM  Final   Special Requests   Final    NONE Performed at [facility], [address] [place], [place] [address]    Culture MULTIPLE SPECIES PRESENT, SUGGEST RECOLLECTION (A)  Final   Report Status 07/06/2019 FINAL  Final  SARS CORONAVIRUS 2 (TAT 6-24 HRS) Nasopharyngeal Nasopharyngeal Swab     Status: None   Collection Time: 07/05/19  4:45 PM   Specimen: Nasopharyngeal Swab  Result Value Ref Range Status   SARS Coronavirus 2 NEGATIVE NEGATIVE Final    Comment:  (NOTE) SARS-CoV-2 target nucleic acids are NOT DETECTED. The SARS-CoV-2 RNA is generally detectable in upper and lower respiratory specimens during the acute phase of infection. Negative results do not preclude SARS-CoV-2 infection, do not rule out co-infections with other pathogens, and should not be used as the sole basis for treatment or other patient management decisions. Negative results must be combined with clinical observations, patient history, and epidemiological information. The expected result is Negative. Fact Sheet for Patients: SugarRoll.be Fact Sheet for Healthcare Providers: https://www.woods-mathews.com/ This test is not yet approved or cleared by the [place] FDA and  has been authorized for detection and/or diagnosis of SARS-CoV-2 by FDA under an Emergency Use Authorization (EUA). This EUA will remain  in effect (meaning this test can be used) for the duration of the COVID-19 declaration under Section 56 4(b)(1) of the Act, 21 U.S.C. section 360bbb-3(b)(1), unless the authorization is terminated or revoked sooner. Performed at [facility], [place] [address] [place], [place] [address]      Labs: Basic Metabolic Panel: Recent Labs  Lab 07/04/19 2033 07/06/19 0322 07/07/19 0643  NA 139 144 139  K 3.6 3.6 3.8  CL  109 115* 107  CO2 21* 21* 24  GLUCOSE 140* 89 77  BUN 13 12 6*  CREATININE 1.04* 0.69 0.72  CALCIUM 9.0 8.2* 8.3*  MG  --   --  1.9  PHOS  --   --  2.8   Liver Function Tests: Recent Labs  Lab 07/04/19 2033 07/07/19 0643  AST 19 18  ALT 17 14  ALKPHOS 106 86  BILITOT 0.7 0.7  PROT 7.0 5.5*  ALBUMIN 3.7 2.7*   Recent Labs  Lab 07/04/19 2033  LIPASE 29   No results for input(s): AMMONIA in the last 168 hours. CBC: Recent Labs  Lab 07/04/19 2033 07/06/19 0322 07/07/19 0643  WBC 9.7 8.4 7.6  NEUTROABS  --   --  5.7  HGB 13.4 10.9* 11.4*  HCT 43.2 35.1* 36.1  MCV 96.0 96.7  95.0  PLT 347 250 228   Cardiac Enzymes: No results for input(s): CKTOTAL, CKMB, CKMBINDEX, TROPONINI in the last 168 hours. BNP: BNP (last 3 results) No results for input(s): BNP in the last 8760 hours.  ProBNP (last 3 results) No results for input(s): PROBNP in the last 8760 hours.  CBG: No results for input(s): GLUCAP in the last 168 hours.     Signed:  Kayleen Memos, MD [facility] 07/07/2019, 12:43 PM

## 2019-07-07 NOTE — Progress Notes (Signed)
AVS given and reviewed with pt. Medications discussed. All questions answered to satisfaction. Pt verbalized understanding of information given. Pt to be escorted off the unit with all belongings via wheelchair by staff member.  

## 2019-08-27 ENCOUNTER — Encounter (HOSPITAL_COMMUNITY): Payer: Self-pay | Admitting: *Deleted

## 2019-08-27 ENCOUNTER — Other Ambulatory Visit: Payer: Self-pay

## 2019-08-27 ENCOUNTER — Emergency Department (HOSPITAL_COMMUNITY)
Admission: EM | Admit: 2019-08-27 | Discharge: 2019-08-28 | Disposition: A | Discharge status: Home / Self Care | Payer: Medicare Other | Attending: Emergency Medicine | Admitting: Emergency Medicine

## 2019-08-27 DIAGNOSIS — B20 Human immunodeficiency virus [HIV] disease: Secondary | ICD-10-CM | POA: Diagnosis not present

## 2019-08-27 DIAGNOSIS — Z7982 Long term (current) use of aspirin: Secondary | ICD-10-CM | POA: Insufficient documentation

## 2019-08-27 DIAGNOSIS — Z79899 Other long term (current) drug therapy: Secondary | ICD-10-CM | POA: Diagnosis not present

## 2019-08-27 DIAGNOSIS — Z7901 Long term (current) use of anticoagulants: Secondary | ICD-10-CM | POA: Insufficient documentation

## 2019-08-27 DIAGNOSIS — I2510 Atherosclerotic heart disease of native coronary artery without angina pectoris: Secondary | ICD-10-CM | POA: Insufficient documentation

## 2019-08-27 DIAGNOSIS — Z87891 Personal history of nicotine dependence: Secondary | ICD-10-CM | POA: Diagnosis not present

## 2019-08-27 DIAGNOSIS — R0989 Other specified symptoms and signs involving the circulatory and respiratory systems: Secondary | ICD-10-CM | POA: Insufficient documentation

## 2019-08-27 DIAGNOSIS — Z20822 Contact with and (suspected) exposure to covid-19: Secondary | ICD-10-CM | POA: Insufficient documentation

## 2019-08-27 NOTE — ED Triage Notes (Signed)
The pt choked on a piece of bacon this am and she reports that she cannot get it out

## 2019-08-28 ENCOUNTER — Emergency Department (HOSPITAL_COMMUNITY): Payer: Medicare Other

## 2019-08-28 DIAGNOSIS — R0989 Other specified symptoms and signs involving the circulatory and respiratory systems: Secondary | ICD-10-CM | POA: Diagnosis not present

## 2019-08-28 LAB — SARS CORONAVIRUS 2 BY RT PCR (HOSPITAL ORDER, PERFORMED IN ~~LOC~~ HOSPITAL LAB): SARS Coronavirus 2: NEGATIVE

## 2019-08-28 LAB — CBC WITH DIFFERENTIAL/PLATELET
Abs Immature Granulocytes: 0.09 10*3/uL — ABNORMAL HIGH (ref 0.00–0.07)
Basophils Absolute: 0.1 10*3/uL (ref 0.0–0.1)
Basophils Relative: 1 %
Eosinophils Absolute: 0.1 10*3/uL (ref 0.0–0.5)
Eosinophils Relative: 1 %
HCT: 42.3 % (ref 36.0–46.0)
Hemoglobin: 13.2 g/dL (ref 12.0–15.0)
Immature Granulocytes: 1 %
Lymphocytes Relative: 18 %
Lymphs Abs: 1.9 10*3/uL (ref 0.7–4.0)
MCH: 30.5 pg (ref 26.0–34.0)
MCHC: 31.2 g/dL (ref 30.0–36.0)
MCV: 97.7 fL (ref 80.0–100.0)
Monocytes Absolute: 1.1 10*3/uL — ABNORMAL HIGH (ref 0.1–1.0)
Monocytes Relative: 11 %
Neutro Abs: 7.3 10*3/uL (ref 1.7–7.7)
Neutrophils Relative %: 68 %
Platelets: 306 10*3/uL (ref 150–400)
RBC: 4.33 MIL/uL (ref 3.87–5.11)
RDW: 15.4 % (ref 11.5–15.5)
WBC: 10.5 10*3/uL (ref 4.0–10.5)
nRBC: 0 % (ref 0.0–0.2)

## 2019-08-28 LAB — BASIC METABOLIC PANEL
Anion gap: 10 (ref 5–15)
BUN: 14 mg/dL (ref 8–23)
CO2: 21 mmol/L — ABNORMAL LOW (ref 22–32)
Calcium: 8.9 mg/dL (ref 8.9–10.3)
Chloride: 109 mmol/L (ref 98–111)
Creatinine, Ser: 0.95 mg/dL (ref 0.44–1.00)
GFR calc Af Amer: >60 mL/min (ref >60)
GFR calc non Af Amer: >60 mL/min (ref >60)
Glucose, Bld: 108 mg/dL — ABNORMAL HIGH (ref 70–99)
Potassium: 3.9 mmol/L (ref 3.5–5.1)
Sodium: 140 mmol/L (ref 135–145)

## 2019-08-28 MED ORDER — ALUM & MAG HYDROXIDE-SIMETH 200-200-20 MG/5ML PO SUSP
30.0000 mL | Freq: Once | ORAL | Status: AC
  Administered 2019-08-28: 30 mL via ORAL
  Filled 2019-08-28: qty 30

## 2019-08-28 MED ORDER — SODIUM CHLORIDE 0.9 % IV BOLUS (SEPSIS): 1000.0000 mL | Freq: Once | INTRAVENOUS | Status: DC

## 2019-08-28 MED ORDER — SODIUM CHLORIDE 0.9 % IV SOLN
1000.0000 mL | INTRAVENOUS | Status: DC
  Administered 2019-08-28: 1000 mL via INTRAVENOUS

## 2019-08-28 MED ORDER — LIDOCAINE VISCOUS HCL 2 % MT SOLN
15.0000 mL | Freq: Once | OROMUCOSAL | Status: AC
  Administered 2019-08-28: 15 mL via ORAL
  Filled 2019-08-28: qty 15

## 2019-08-28 MED ORDER — LIDOCAINE VISCOUS HCL 2 % MT SOLN
15.0000 mL | Freq: Four times a day (QID) | OROMUCOSAL | 0 refills | Status: DC | PRN
Start: 2019-08-28 — End: 2021-07-20

## 2019-08-28 NOTE — ED Provider Notes (Signed)
[facility] EMERGENCY DEPARTMENT Provider Note  CSN: [id number] Arrival date & time: 08/27/19 2005  Chief Complaint(s) Sore Throat  HPI [name] [name] is a 69 y.o. female with h/o schatzki ring here for food bolus sensation.  Patient reports that she ate bacon this morning around 9 AM and felt like it got stuck in her throat.  She is attempted to drink fluids including soda but has not been able to get it down.  She denies any associated shortness of breath.  Reports that she has required prior dilatations due to strictures.  Denies any chest pain or shortness of breath.  She is able to tolerate her own secretions.  No recent fevers or infections.  No cough or congestion.  No other physical complaints.   HPI  Past Medical History Past Medical History:  Diagnosis Date  . Asthma   . Bradycardia    Sinus bradycardia  . CAD (coronary artery disease)    Minimal by history, Question coronary artery spasm  . Colon polyps   . Dyslipidemia   . GERD (gastroesophageal reflux disease)    Esophageal stricture  . H/O: hysterectomy   . Hiatal hernia   . HIV positive ([place])    Needle stick 1993  . Hypertension   . Palpitations   . Peptic ulcer disease   . Pulmonary fibrosis (HCC)    on 2L home O2, prn at night  . Shortness of breath    Extensive evaluation Dr. Winona Legato, [facility], May, 6948, etiology uncertain, probable bronchiolitis possibility of lung biopsy at that time  . Weight loss    Patient Active Problem List   Diagnosis Date Noted  . Intractable nausea and vomiting 07/05/2019  . Nausea & vomiting 09/04/2018  . UTI (urinary tract infection) 09/04/2018  . Nausea and/or vomiting 09/04/2018  . Influenza due to identified novel influenza A virus with other respiratory manifestations 04/22/2018  . Hypokalemia 04/22/2018  . HIV (human immunodeficiency virus infection) ([place]) 04/21/2018  . Sepsis ([place]) 04/21/2018  . Abdominal pain 04/21/2018  . Asthma exacerbation  04/21/2018  . Anxiety state 03/17/2016  . Benign neoplasm of colon 03/17/2016  . Human immunodeficiency virus (HIV) disease ([place]) 03/17/2016  . Nontoxic uninodular goiter 03/17/2016  . Other premature beats 03/17/2016  . Rectocele 03/17/2016  . Symptomatic bradycardia 03/17/2016  . Atypical chest pain 03/17/2016  . Lower extremity edema 10/13/2015  . Localized acrodermatitis continua of [place] 06/29/2015  . Cervical pain (neck) 06/14/2015  . Dysphagia 12/14/2014  . IPF (idiopathic pulmonary fibrosis) ([place]) 07/06/2014  . Paronychia 06/16/2014  . Bone pain 08/25/2013  . Lumbar radicular pain 08/25/2013  . Allergic rhinitis 07/14/2013  . Post-operative state 03/25/2013  . Preop cardiovascular exam 12/24/2012  . Ejection fraction   . Cystocele 12/11/2012  . Insomnia 10/23/2012  . Vaginal prolapse 04/02/2012  . Urinary incontinence 02/28/2012  . Other benign neoplasm of connective and other soft tissue of lower limb, including hip 09/08/2011  . Osteoporosis 03/20/2011  . Closed fracture of sacrum ([place]) 12/23/2010  . HIV positive ([place])   . CAD (coronary artery disease)   . Palpitations   . Dyslipidemia   . Persistent asthma   . Peptic ulcer disease   . GERD (gastroesophageal reflux disease)   . Bradycardia   . Shortness of breath   . CONSTIPATION 07/22/2007  . WEIGHT LOSS-ABNORMAL 07/22/2007  . ABDOMINAL PAIN-GENERALIZED 07/22/2007   Home Medication(s) Prior to Admission medications   Medication Sig Start Date End Date Taking?  Authorizing Provider  albuterol (VENTOLIN HFA) 108 (90 Base) MCG/ACT inhaler Inhale 1-2 puffs into the lungs every 6 (six) hours as needed for wheezing or shortness of breath.    [provider]  amitriptyline (ELAVIL) 50 MG tablet Take 50 mg by mouth at bedtime.      [provider]  aspirin EC 81 MG tablet Take 1 tablet (81 mg total) by mouth daily. 03/17/16   Dorothy Spark, MD  atorvastatin (LIPITOR) 40 MG tablet Take 40 mg by  mouth daily at 6 PM.     [provider]  budesonide-formoterol (SYMBICORT) 160-4.5 MCG/ACT inhaler Inhale 2 puffs into the lungs 2 (two) times daily.     [provider]  clopidogrel (PLAVIX) 75 MG tablet Take 75 mg by mouth daily.    [provider]  conjugated estrogens (PREMARIN) vaginal cream Place 7.51 Applicatorfuls vaginally every Wednesday. Pt only to use 1/4 of the applicator     [provider]  Dolutegravir-lamiVUDine (DOVATO) 50-300 MG TABS Take 1 tablet by mouth daily.    [provider]  Ergocalciferol (VITAMIN D2) 50 MCG (2000 UT) TABS Take 2,000 Units by mouth daily.    [provider]  lidocaine (XYLOCAINE) 2 % solution Use as directed 15 mLs in the mouth or throat every 6 (six) hours as needed (sore throat). 08/28/19   Shatonia Hoots, Grayce Sessions, MD  metoprolol tartrate (LOPRESSOR) 25 MG tablet Take 12.5 mg by mouth 2 (two) times daily. 12/19/17   [provider]  nitroGLYCERIN (NITROSTAT) 0.4 MG SL tablet Place 1 tablet (0.4 mg total) under the tongue every 5 (five) minutes as needed for chest pain. 03/21/16   Belva Crome, MD  omeprazole (PRILOSEC) 40 MG capsule Take 1 capsule (40 mg total) by mouth 2 (two) times daily. 07/07/19 09/05/19  Kayleen Memos, DO  Pirfenidone (ESBRIET) 267 MG CAPS Take 1,068 mg by mouth 2 (two) times daily.     [provider]  polyethylene glycol (MIRALAX / GLYCOLAX) packet Take 17 g by mouth daily as needed (constipation).     [provider]                                                                                                                                    Past Surgical History Past Surgical History:  Procedure Laterality Date  . ABDOMINAL HYSTERECTOMY    . APPENDECTOMY    . CARDIAC CATHETERIZATION N/A 03/21/2016   Procedure: Right/Left Heart Cath and Coronary Angiography;  Surgeon: Belva Crome, MD;  Location: [place] [facility];  Service: Cardiovascular;   Laterality: N/A;  . CHOLECYSTECTOMY    . CORONARY STENT PLACEMENT  12/2017  . GASTRECTOMY    . VENTRAL HERNIA REPAIR     Family History Family History  Problem Relation Age of Onset  . Coronary artery disease Other        family hx  of on mother, father and siblings  . Bradycardia Sister   . Diabetes Mother   . Heart attack Mother   . Heart disease Mother   . Hypertension Mother   . Stroke Mother   . Bradycardia Brother     Social History Social History   Tobacco Use  . Smoking status: Former Smoker    Quit date: 03/13/1996    Years since quitting: 23.4  . Smokeless tobacco: Never Used  Vaping Use  . Vaping Use: Never used  Substance Use Topics  . Alcohol use: No  . Drug use: No   Allergies Isosorbide nitrate and Sulfamethoxazole-trimethoprim  Review of Systems Review of Systems All other systems are reviewed and are negative for acute change except as noted in the HPI  Physical Exam Vital Signs  I have reviewed the triage vital signs BP 112/65 (BP Location: Right Arm)   Pulse (!) 124   Temp (!) 97.5 F (36.4 C) (Oral)   Resp 16   Wt 65.5 kg   SpO2 96%   BMI 29.17 kg/m   Physical Exam Vitals reviewed.  Constitutional:      General: She is not in acute distress.    Appearance: She is well-developed. She is not diaphoretic.  HENT:     Head: Normocephalic and atraumatic.     Nose: Nose normal.     Mouth/Throat:     Pharynx: Oropharynx is clear.  Eyes:     General: No scleral icterus.       Right eye: No discharge.        Left eye: No discharge.     Conjunctiva/sclera: Conjunctivae normal.     Pupils: Pupils are equal, round, and reactive to light.  Cardiovascular:     Rate and Rhythm: Normal rate and regular rhythm.     Heart sounds: No murmur heard.  No friction rub. No gallop.   Pulmonary:     Effort: Pulmonary effort is normal. No respiratory distress.     Breath sounds: Normal breath sounds. No stridor. No rales.  Abdominal:     General:  There is no distension.     Palpations: Abdomen is soft.     Tenderness: There is no abdominal tenderness.  Musculoskeletal:        General: No tenderness.     Cervical back: Normal range of motion and neck supple.  Skin:    General: Skin is warm and dry.     Findings: No erythema or rash.  Neurological:     Mental Status: She is alert and oriented to person, place, and time.     ED Results and Treatments Labs (all labs ordered are listed, but only abnormal results are displayed) Labs Reviewed  CBC WITH DIFFERENTIAL/PLATELET - Abnormal; Notable for the following components:      Result Value   Monocytes Absolute 1.1 (*)    Abs Immature Granulocytes 0.09 (*)    All other components within normal limits  BASIC METABOLIC PANEL - Abnormal; Notable for the following components:   CO2 21 (*)    Glucose, Bld 108 (*)    All other components within normal limits  SARS CORONAVIRUS 2 BY RT PCR (HOSPITAL ORDER, [facility])  EKG  EKG Interpretation  Date/Time:    Ventricular Rate:    PR Interval:    QRS Duration:   QT Interval:    QTC Calculation:   R Axis:     Text Interpretation:        Radiology CT Soft Tissue Neck Wo Contrast  Result Date: 08/28/2019 CLINICAL DATA:  Initial evaluation for possible foreign body, choked on food. EXAM: CT NECK WITHOUT CONTRAST TECHNIQUE: Multidetector CT imaging of the neck was performed following the standard protocol without intravenous contrast. COMPARISON:  None available. FINDINGS: Pharynx and larynx: Oral cavity within normal limits without discrete mass or collection. Patient is edentulous. Oropharynx within normal limits. Non mass like polypoid protrusion noted at the posterior nasopharynx at the level of the uvula (series 5, image 34), likely of doubtful significance. No retropharyngeal swelling  or collection. Epiglottis normal. Vallecula clear. Remainder of the hypopharynx and supraglottic larynx within normal limits. Glottis within normal limits. Subglottic airway clear. No retained food bolus or other radiopaque foreign body. Salivary glands: Salivary glands including the parotid and submandibular glands are within normal limits. Thyroid: Thyroid within normal limits. Lymph nodes: No pathologically enlarged lymph nodes seen within the neck. Vascular: Mild atherosclerotic change noted about the aortic arch. Evaluation for vascular patency limited given lack of IV contrast. Limited intracranial: Unremarkable. Visualized orbits: She visualized globes and orbital soft tissues within normal limits. Mastoids and visualized paranasal sinuses: Visualized paranasal sinuses are clear. Mastoid air cells and middle ear cavities are well pneumatized and free of fluid. Skeleton: No acute osseous abnormality. No discrete or worrisome osseous lesions. Mild cervical spondylosis noted at C4-5 and C5-6. No significant spinal stenosis. Upper chest: Better evaluated on concomitant CT of the chest. No visualized food bolus seen within the upper esophagus. Scattered subpleural fibrotic changes noted within the visualized lungs. Other: None. IMPRESSION: 1. Negative CT of the neck. No visible retained food bolus or other radiopaque foreign body identified. 2. Mild cervical spondylosis at C4-5 and C5-6. Electronically Signed   By: Jeannine Boga M.D.   On: 08/28/2019 02:28   CT Chest Wo Contrast  Result Date: 08/28/2019 CLINICAL DATA:  Food caught in throat. EXAM: CT CHEST WITHOUT CONTRAST TECHNIQUE: Multidetector CT imaging of the chest was performed following the standard protocol without IV contrast. COMPARISON:  None. FINDINGS: Cardiovascular: Calcifications in the coronary arteries and aorta. No aneurysm. Heart is normal size. Mediastinum/Nodes: Moderate-sized hiatal hernia. No mediastinal, hilar, or axillary  adenopathy. Trachea and esophagus are unremarkable. Thyroid unremarkable. No visible abnormality within the esophagus. Lungs/Pleura: Peripheral interstitial thickening reticulation compatible with scarring/early fibrosis. No confluent opacities or effusions. Upper Abdomen: Imaging into the upper abdomen shows no acute findings. Postoperative changes in the stomach. Musculoskeletal: Chest wall soft tissues are unremarkable. No acute bony abnormality. IMPRESSION: No abnormality seen within the esophagus. No visible fluid bolus or or radiopaque foreign body. Moderate-sized hiatal hernia. Coronary artery disease, aortic atherosclerosis. Changes of fibrosis within the lungs. Electronically Signed   By: Rolm Baptise M.D.   On: 08/28/2019 01:21    Pertinent labs & imaging results that were available during my care of the patient were reviewed by me and considered in my medical decision making (see chart for details).  Medications Ordered in ED Medications  sodium chloride 0.9 % bolus 1,000 mL (has no administration in time range)    Followed by  0.9 %  sodium chloride infusion (1,000 mLs Intravenous New Bag/Given 08/28/19 0121)  alum & mag hydroxide-simeth (MAALOX/MYLANTA) 200-200-20  MG/5ML suspension 30 mL (30 mLs Oral Given 08/28/19 0311)    And  lidocaine (XYLOCAINE) 2 % viscous mouth solution 15 mL (15 mLs Oral Given 08/28/19 7371)                                                                                                                                    Procedures Procedures  (including critical care time)  Medical Decision Making / ED Course I have reviewed the nursing notes for this encounter and the patient's prior records (if available in EHR or on provided paperwork).   [name] [name] was evaluated in Emergency Department on 08/28/2019 for the symptoms described in the history of present illness. She was evaluated in the context of the global COVID-19 pandemic, which necessitated  consideration that the patient might be at risk for infection with the SARS-CoV-2 virus that causes COVID-19. Institutional protocols and algorithms that pertain to the evaluation of patients at risk for COVID-19 are in a state of rapid change based on information released by regulatory bodies including the CDC and federal and state organizations. These policies and algorithms were followed during the patient's care in the ED.  Food bolus sensation for approximately 14 hours Patient is in no respiratory distress and tolerating her secretions. CT of the neck and chest without evidence of retained food particles. Labs reassuring.  Patient provided with GI cocktail relief of discomfort. She was able to tolerate PO.      Final Clinical Impression(s) / ED Diagnoses Final diagnoses:  Globus sensation    The patient appears reasonably screened and/or stabilized for discharge and I doubt any other medical condition or other [facility] requiring further screening, evaluation, or treatment in the ED at this time prior to discharge. Safe for discharge with strict return precautions.  Disposition: Discharge  Condition: Good  I have discussed the results, Dx and Tx plan with the patient/family who expressed understanding and agree(s) with the plan. Discharge instructions discussed at length. The patient/family was given strict return precautions who verbalized understanding of the instructions. No further questions at time of discharge.    ED Discharge Orders         Ordered    lidocaine (XYLOCAINE) 2 % solution  Every 6 hours PRN     Discontinue  Reprint     08/28/19 0452           Follow Up: Primary care provider  Schedule an appointment as soon as possible for a visit  As needed     This chart was dictated using voice recognition software.  Despite best efforts to proofread,  errors can occur which can change the documentation meaning.   Fatima Blank, MD 08/28/19 [phone]

## 2019-09-28 IMAGING — CR DG CHEST 2V
2 series · 2 of 2 positions shown · non-contrast
Comparison: 12/18/2016

CLINICAL DATA: Dry cough 3-4 days which shortness-of-breath and
diffuse chest pain as well as intermittent lightheadedness beginning
yesterday. Fever today.

EXAM:
CHEST  2 VIEW

[chest pa]
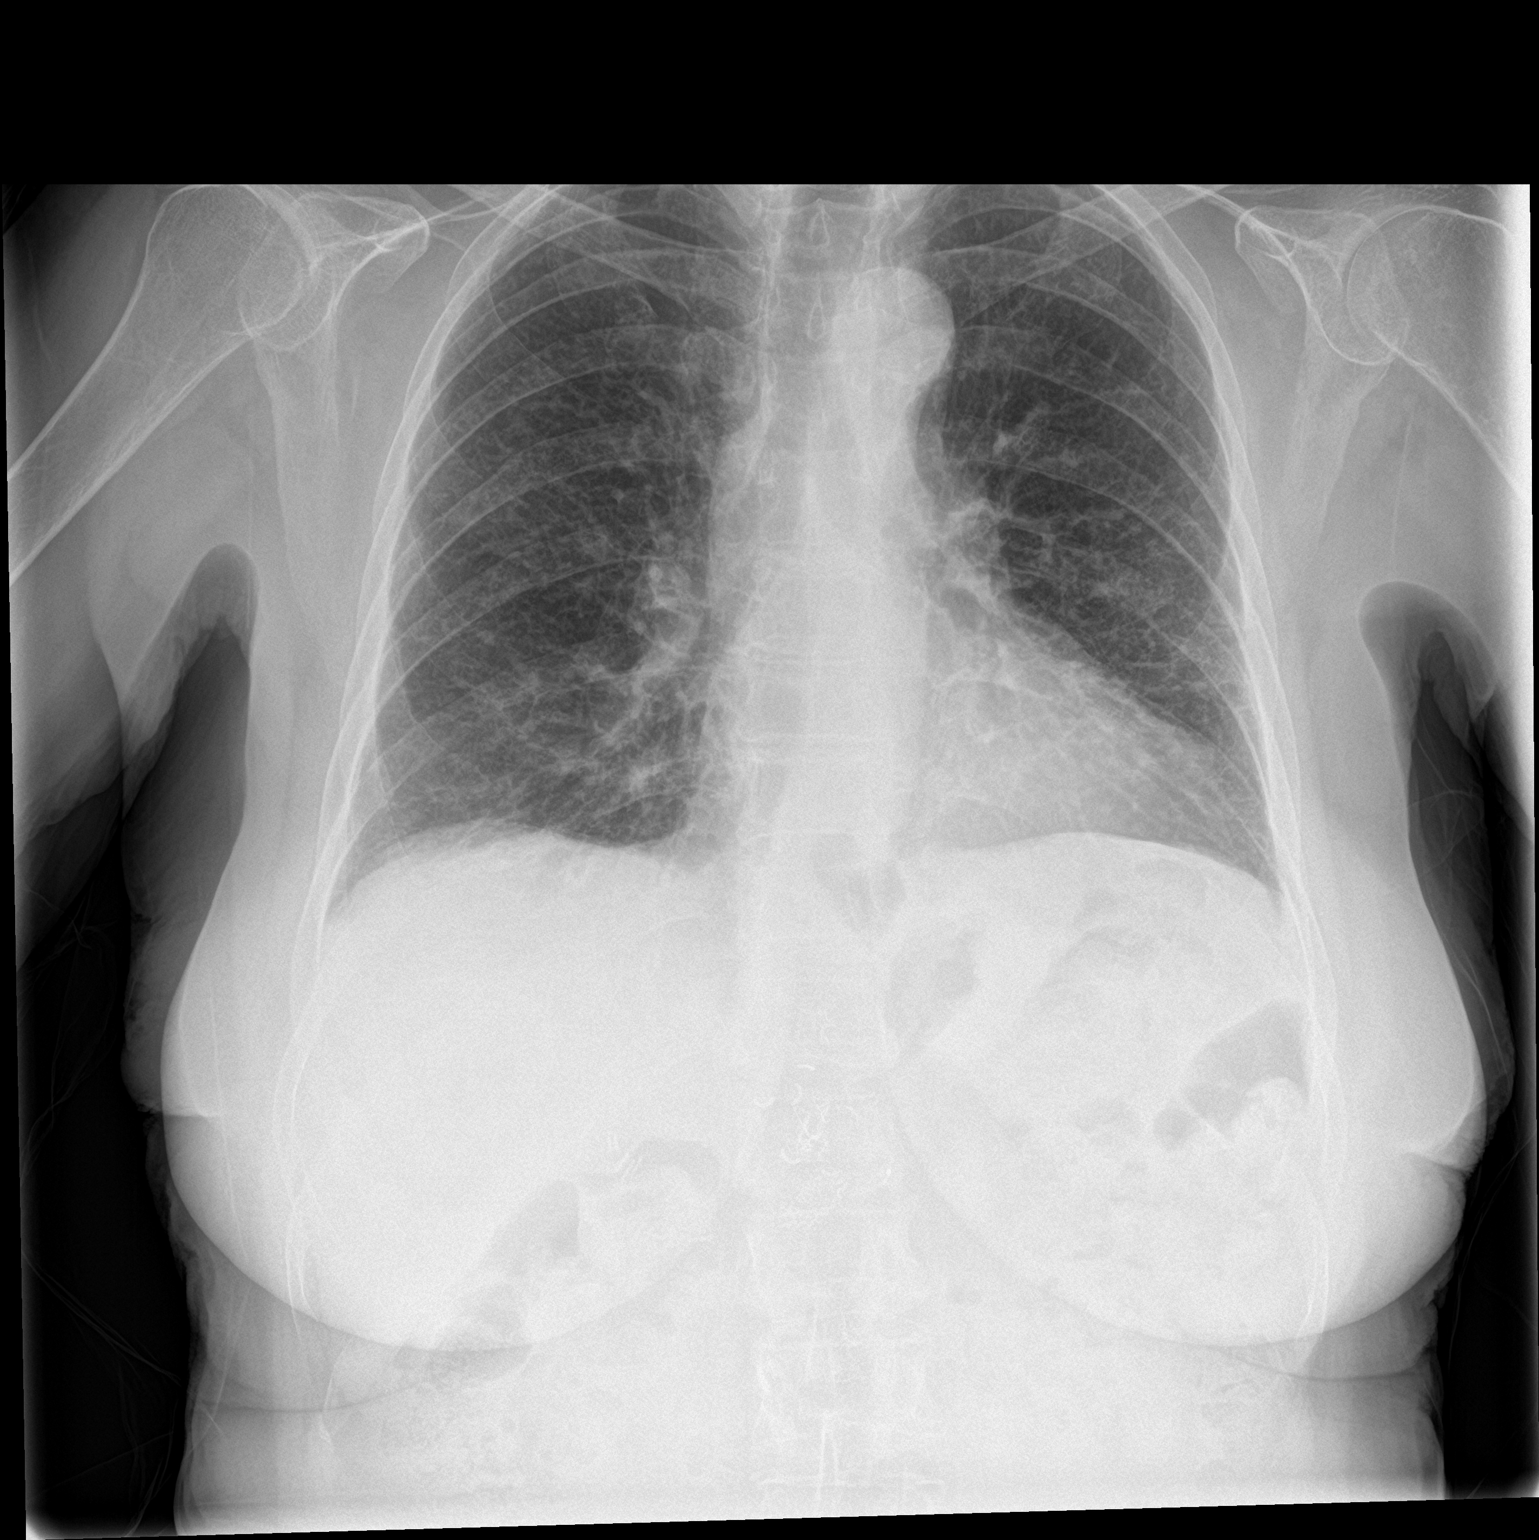

[chest lat]
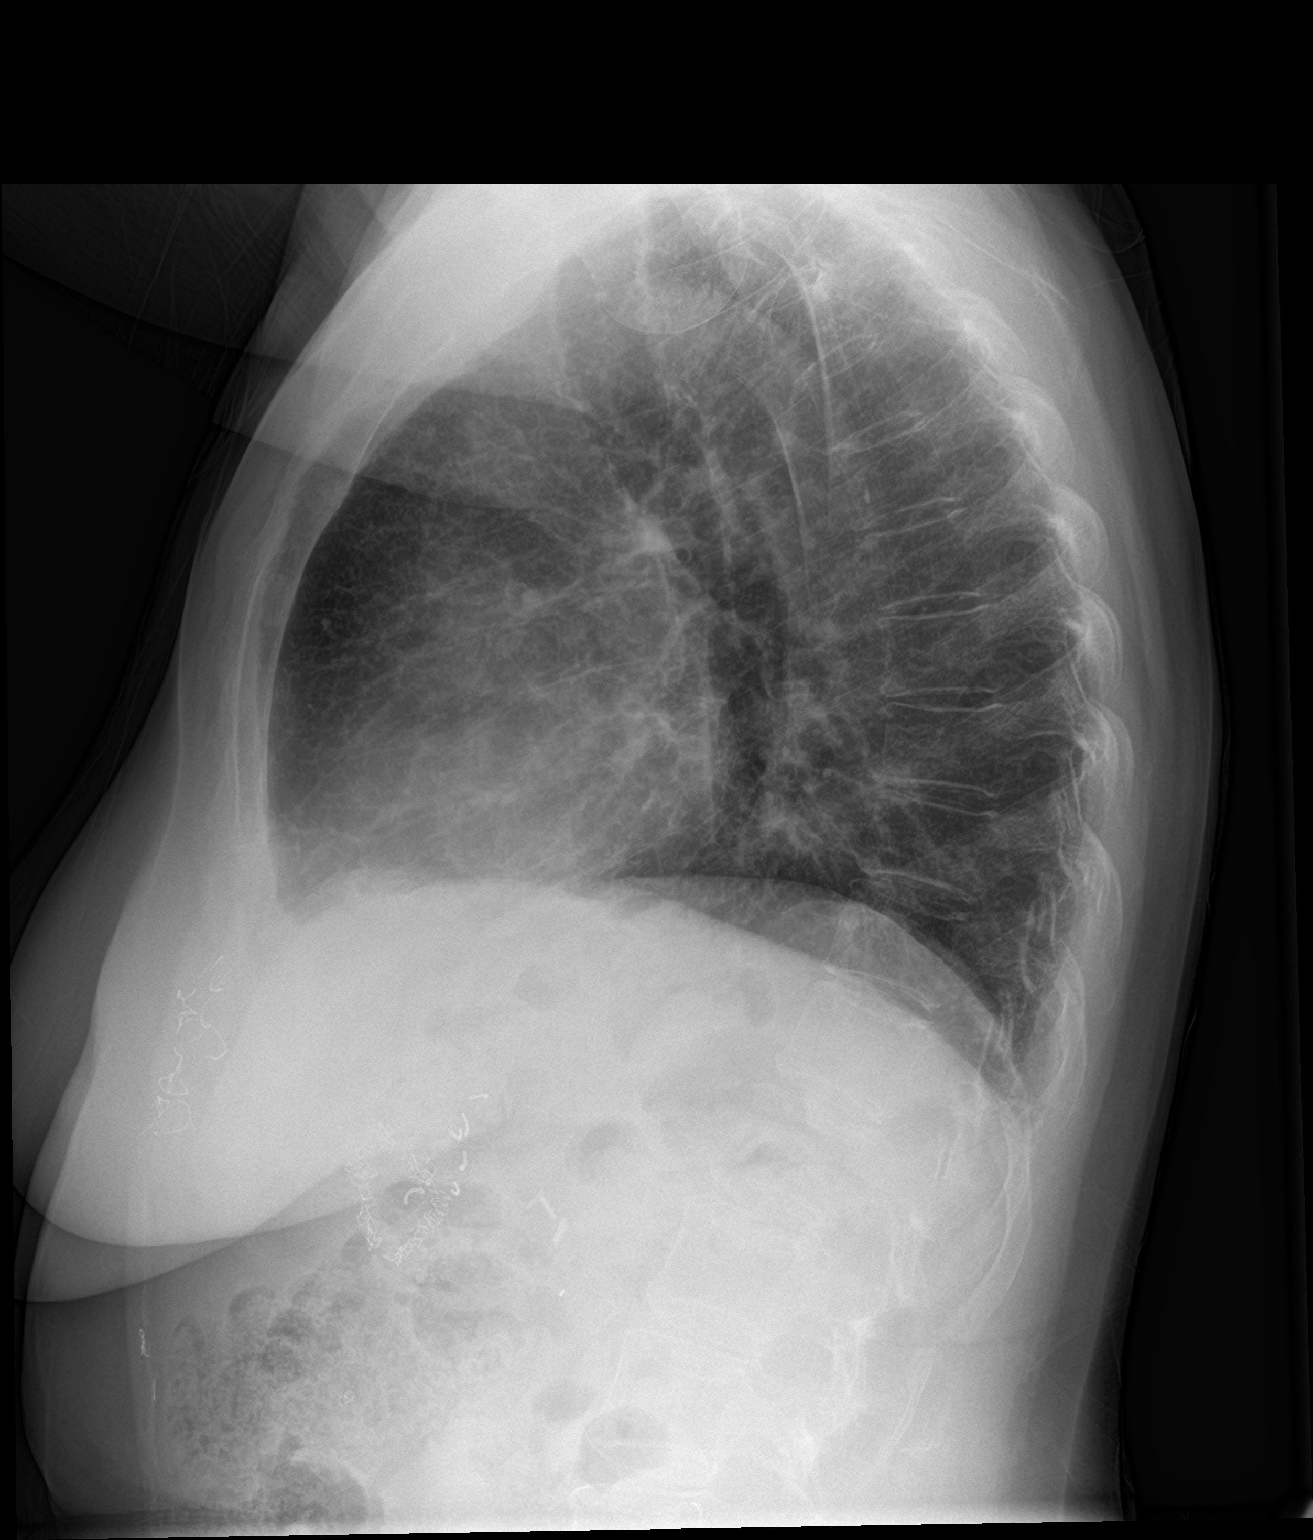

[2 of 2 positions shown; findings below may reference images not displayed]

FINDINGS: Lungs are somewhat hypoinflated without focal airspace consolidation
or effusion. Cardiomediastinal silhouette and remainder the exam is
unchanged.
IMPRESSION: Hypoinflation without acute cardiopulmonary disease.

## 2021-06-10 ENCOUNTER — Encounter (HOSPITAL_COMMUNITY): Payer: Self-pay

## 2021-06-10 ENCOUNTER — Inpatient Hospital Stay (HOSPITAL_COMMUNITY)
Admission: EM | Admit: 2021-06-10 | Discharge: 2021-06-17 | DRG: 312 | Disposition: A | Discharge status: Home Health | Payer: Medicare Other | Attending: Internal Medicine | Admitting: Internal Medicine

## 2021-06-10 ENCOUNTER — Emergency Department (HOSPITAL_COMMUNITY): Payer: Medicare Other

## 2021-06-10 ENCOUNTER — Other Ambulatory Visit: Payer: Self-pay

## 2021-06-10 DIAGNOSIS — E785 Hyperlipidemia, unspecified: Secondary | ICD-10-CM | POA: Diagnosis present

## 2021-06-10 DIAGNOSIS — Z7902 Long term (current) use of antithrombotics/antiplatelets: Secondary | ICD-10-CM

## 2021-06-10 DIAGNOSIS — Z888 Allergy status to other drugs, medicaments and biological substances status: Secondary | ICD-10-CM

## 2021-06-10 DIAGNOSIS — R112 Nausea with vomiting, unspecified: Secondary | ICD-10-CM | POA: Diagnosis present

## 2021-06-10 DIAGNOSIS — I10 Essential (primary) hypertension: Secondary | ICD-10-CM | POA: Diagnosis present

## 2021-06-10 DIAGNOSIS — N3281 Overactive bladder: Secondary | ICD-10-CM | POA: Diagnosis present

## 2021-06-10 DIAGNOSIS — I25110 Atherosclerotic heart disease of native coronary artery with unstable angina pectoris: Secondary | ICD-10-CM | POA: Diagnosis present

## 2021-06-10 DIAGNOSIS — Z90710 Acquired absence of both cervix and uterus: Secondary | ICD-10-CM

## 2021-06-10 DIAGNOSIS — R55 Syncope and collapse: Secondary | ICD-10-CM

## 2021-06-10 DIAGNOSIS — K219 Gastro-esophageal reflux disease without esophagitis: Secondary | ICD-10-CM | POA: Diagnosis present

## 2021-06-10 DIAGNOSIS — Z8249 Family history of ischemic heart disease and other diseases of the circulatory system: Secondary | ICD-10-CM

## 2021-06-10 DIAGNOSIS — N390 Urinary tract infection, site not specified: Secondary | ICD-10-CM | POA: Clinically undetermined

## 2021-06-10 DIAGNOSIS — Z86010 Personal history of colonic polyps: Secondary | ICD-10-CM

## 2021-06-10 DIAGNOSIS — Z7951 Long term (current) use of inhaled steroids: Secondary | ICD-10-CM

## 2021-06-10 DIAGNOSIS — J9611 Chronic respiratory failure with hypoxia: Secondary | ICD-10-CM | POA: Diagnosis present

## 2021-06-10 DIAGNOSIS — Z7952 Long term (current) use of systemic steroids: Secondary | ICD-10-CM

## 2021-06-10 DIAGNOSIS — Z833 Family history of diabetes mellitus: Secondary | ICD-10-CM

## 2021-06-10 DIAGNOSIS — Z8711 Personal history of peptic ulcer disease: Secondary | ICD-10-CM

## 2021-06-10 DIAGNOSIS — Z955 Presence of coronary angioplasty implant and graft: Secondary | ICD-10-CM

## 2021-06-10 DIAGNOSIS — I959 Hypotension, unspecified: Secondary | ICD-10-CM | POA: Diagnosis present

## 2021-06-10 DIAGNOSIS — R109 Unspecified abdominal pain: Secondary | ICD-10-CM | POA: Diagnosis present

## 2021-06-10 DIAGNOSIS — D539 Nutritional anemia, unspecified: Secondary | ICD-10-CM

## 2021-06-10 DIAGNOSIS — J84112 Idiopathic pulmonary fibrosis: Secondary | ICD-10-CM | POA: Diagnosis present

## 2021-06-10 DIAGNOSIS — R7401 Elevation of levels of liver transaminase levels: Secondary | ICD-10-CM | POA: Diagnosis present

## 2021-06-10 DIAGNOSIS — Z79899 Other long term (current) drug therapy: Secondary | ICD-10-CM

## 2021-06-10 DIAGNOSIS — B9620 Unspecified Escherichia coli [E. coli] as the cause of diseases classified elsewhere: Secondary | ICD-10-CM | POA: Diagnosis present

## 2021-06-10 DIAGNOSIS — E538 Deficiency of other specified B group vitamins: Secondary | ICD-10-CM

## 2021-06-10 DIAGNOSIS — Z9981 Dependence on supplemental oxygen: Secondary | ICD-10-CM

## 2021-06-10 DIAGNOSIS — E876 Hypokalemia: Secondary | ICD-10-CM | POA: Diagnosis present

## 2021-06-10 DIAGNOSIS — G8929 Other chronic pain: Secondary | ICD-10-CM | POA: Diagnosis present

## 2021-06-10 DIAGNOSIS — B20 Human immunodeficiency virus [HIV] disease: Secondary | ICD-10-CM | POA: Diagnosis present

## 2021-06-10 DIAGNOSIS — I951 Orthostatic hypotension: Secondary | ICD-10-CM | POA: Diagnosis not present

## 2021-06-10 DIAGNOSIS — Z7982 Long term (current) use of aspirin: Secondary | ICD-10-CM

## 2021-06-10 DIAGNOSIS — Z20822 Contact with and (suspected) exposure to covid-19: Secondary | ICD-10-CM | POA: Diagnosis present

## 2021-06-10 DIAGNOSIS — K920 Hematemesis: Secondary | ICD-10-CM | POA: Diagnosis present

## 2021-06-10 DIAGNOSIS — Z21 Asymptomatic human immunodeficiency virus [HIV] infection status: Secondary | ICD-10-CM | POA: Diagnosis present

## 2021-06-10 DIAGNOSIS — Z87891 Personal history of nicotine dependence: Secondary | ICD-10-CM

## 2021-06-10 DIAGNOSIS — Z9049 Acquired absence of other specified parts of digestive tract: Secondary | ICD-10-CM

## 2021-06-10 DIAGNOSIS — Z825 Family history of asthma and other chronic lower respiratory diseases: Secondary | ICD-10-CM

## 2021-06-10 DIAGNOSIS — I2510 Atherosclerotic heart disease of native coronary artery without angina pectoris: Secondary | ICD-10-CM | POA: Diagnosis present

## 2021-06-10 DIAGNOSIS — J45909 Unspecified asthma, uncomplicated: Secondary | ICD-10-CM | POA: Diagnosis present

## 2021-06-10 DIAGNOSIS — Z823 Family history of stroke: Secondary | ICD-10-CM

## 2021-06-10 DIAGNOSIS — R32 Unspecified urinary incontinence: Secondary | ICD-10-CM | POA: Diagnosis present

## 2021-06-10 DIAGNOSIS — R531 Weakness: Principal | ICD-10-CM

## 2021-06-10 LAB — CBC WITH DIFFERENTIAL/PLATELET
Abs Immature Granulocytes: 0.07 10*3/uL (ref 0.00–0.07)
Basophils Absolute: 0 10*3/uL (ref 0.0–0.1)
Basophils Relative: 1 %
Eosinophils Absolute: 0 10*3/uL (ref 0.0–0.5)
Eosinophils Relative: 1 %
HCT: 42.7 % (ref 36.0–46.0)
Hemoglobin: 13.6 g/dL (ref 12.0–15.0)
Immature Granulocytes: 1 %
Lymphocytes Relative: 16 %
Lymphs Abs: 1.2 10*3/uL (ref 0.7–4.0)
MCH: 32.2 pg (ref 26.0–34.0)
MCHC: 31.9 g/dL (ref 30.0–36.0)
MCV: 100.9 fL — ABNORMAL HIGH (ref 80.0–100.0)
Monocytes Absolute: 1.1 10*3/uL — ABNORMAL HIGH (ref 0.1–1.0)
Monocytes Relative: 15 %
Neutro Abs: 4.8 10*3/uL (ref 1.7–7.7)
Neutrophils Relative %: 66 %
Platelets: 232 10*3/uL (ref 150–400)
RBC: 4.23 MIL/uL (ref 3.87–5.11)
RDW: 15.8 % — ABNORMAL HIGH (ref 11.5–15.5)
WBC: 7.2 10*3/uL (ref 4.0–10.5)
nRBC: 0 % (ref 0.0–0.2)

## 2021-06-10 LAB — COMPREHENSIVE METABOLIC PANEL
ALT: 29 U/L (ref 0–44)
AST: 73 U/L — ABNORMAL HIGH (ref 15–41)
Albumin: 3.4 g/dL — ABNORMAL LOW (ref 3.5–5.0)
Alkaline Phosphatase: 60 U/L (ref 38–126)
Anion gap: 11 (ref 5–15)
BUN: 12 mg/dL (ref 8–23)
CO2: 20 mmol/L — ABNORMAL LOW (ref 22–32)
Calcium: 8.9 mg/dL (ref 8.9–10.3)
Chloride: 108 mmol/L (ref 98–111)
Creatinine, Ser: 0.7 mg/dL (ref 0.44–1.00)
GFR, Estimated: >60 mL/min (ref >60)
Glucose, Bld: 106 mg/dL — ABNORMAL HIGH (ref 70–99)
Potassium: 4 mmol/L (ref 3.5–5.1)
Sodium: 139 mmol/L (ref 135–145)
Total Bilirubin: 0.8 mg/dL (ref 0.3–1.2)
Total Protein: 6.5 g/dL (ref 6.5–8.1)

## 2021-06-10 LAB — LIPASE, BLOOD: Lipase: 26 U/L (ref 11–51)

## 2021-06-10 LAB — URINALYSIS, ROUTINE W REFLEX MICROSCOPIC
Bilirubin Urine: NEGATIVE
Glucose, UA: NEGATIVE mg/dL
Hgb urine dipstick: NEGATIVE
Ketones, ur: 5 mg/dL — AB
Leukocytes,Ua: NEGATIVE
Nitrite: NEGATIVE
Protein, ur: NEGATIVE mg/dL
Specific Gravity, Urine: 1.023 (ref 1.005–1.030)
pH: 5 (ref 5.0–8.0)

## 2021-06-10 LAB — RESP PANEL BY RT-PCR (FLU A&B, COVID) ARPGX2
Influenza A by PCR: NEGATIVE
Influenza B by PCR: NEGATIVE
SARS Coronavirus 2 by RT PCR: NEGATIVE

## 2021-06-10 LAB — TROPONIN I (HIGH SENSITIVITY)
Troponin I (High Sensitivity): 4 ng/L (ref <18)
Troponin I (High Sensitivity): 6 ng/L (ref <18)

## 2021-06-10 MED ORDER — SODIUM CHLORIDE 0.9 % IV BOLUS
1000.0000 mL | Freq: Once | INTRAVENOUS | Status: AC
  Administered 2021-06-10: 1000 mL via INTRAVENOUS

## 2021-06-10 MED ORDER — ONDANSETRON HCL 4 MG/2ML IJ SOLN
4.0000 mg | Freq: Once | INTRAMUSCULAR | Status: AC
  Administered 2021-06-10: 4 mg via INTRAVENOUS
  Filled 2021-06-10: qty 2

## 2021-06-10 NOTE — ED Provider Notes (Signed)
?[facility] ?Provider Note ? ? ?CSN: [id number] ?Arrival date & time: 06/10/21  1758 ? ?  ? ?History ? ?Chief Complaint  ?Patient presents with  ? Weakness  ? ? ?[name] [name] is a 71 y.o. female. ? ?71 year old female with past medical history of HIV, CAD, GERD, pulmonary fibrosis, asthma presents via EMS for evaluation of weakness and shortness of breath.  She states that she has been weak over the past week that has been progressively worsening.  She states her weakness significantly worsened today and has been unable to get around the house.  She also states that her shortness of breath started today.  She endorses cough.  She denies fever, chills.  She was recently diagnosed with UTI and started on antibiotics.  She states she took 1 dose and stopped taking the antibiotics because she read the pamphlet which said there is significant amount of side effects that correlated with her medical history.  She states since then she has not reached out to her urologist or taking the antibiotics.  She also states that she has been nauseous and has vomited multiple times following meals.  States recently she has been throwing up acid and what she perceives to be blood.  She states she also blew her nose and had a couple blood clots come out.  Family at bedside have not witnessed this.  She denies hemoptysis. ? ?The history is provided by the patient. No language interpreter was used.  ? ?  ? ?Home Medications ?Prior to Admission medications   ?Medication Sig Start Date End Date Taking? Authorizing Provider  ?albuterol (VENTOLIN HFA) 108 (90 Base) MCG/ACT inhaler Inhale 1-2 puffs into the lungs every 6 (six) hours as needed for wheezing or shortness of breath.    [provider]  ?amitriptyline (ELAVIL) 50 MG tablet Take 50 mg by mouth at bedtime.      [provider]  ?aspirin EC 81 MG tablet Take 1 tablet (81 mg total) by mouth daily. 03/17/16   Dorothy Spark, MD   ?atorvastatin (LIPITOR) 40 MG tablet Take 40 mg by mouth daily at 6 PM.     [provider]  ?budesonide-formoterol (SYMBICORT) 160-4.5 MCG/ACT inhaler Inhale 2 puffs into the lungs 2 (two) times daily.     [provider]  ?clopidogrel (PLAVIX) 75 MG tablet Take 75 mg by mouth daily.    [provider]  ?conjugated estrogens (PREMARIN) vaginal cream Place 3.21 Applicatorfuls vaginally every Wednesday. Pt only to use 1/4 of the applicator     [provider]  ?Dolutegravir-lamiVUDine (DOVATO) 50-300 MG TABS Take 1 tablet by mouth daily.    [provider]  ?Ergocalciferol (VITAMIN D2) 50 MCG (2000 UT) TABS Take 2,000 Units by mouth daily.    [provider]  ?lidocaine (XYLOCAINE) 2 % solution Use as directed 15 mLs in the mouth or throat every 6 (six) hours as needed (sore throat). 08/28/19   Fatima Blank, MD  ?metoprolol tartrate (LOPRESSOR) 25 MG tablet Take 12.5 mg by mouth 2 (two) times daily. 12/19/17   [provider]  ?nitroGLYCERIN (NITROSTAT) 0.4 MG SL tablet Place 1 tablet (0.4 mg total) under the tongue every 5 (five) minutes as needed for chest pain. 03/21/16   Belva Crome, MD  ?omeprazole (PRILOSEC) 40 MG capsule Take 1 capsule (40 mg total) by mouth 2 (two) times daily. 07/07/19 09/05/19  Kayleen Memos, DO  ?Pirfenidone (ESBRIET) 267 MG  CAPS Take 1,068 mg by mouth 2 (two) times daily.     [provider]  ?polyethylene glycol (MIRALAX / GLYCOLAX) packet Take 17 g by mouth daily as needed (constipation).     [provider]  ?   ? ?Allergies    ?Isosorbide nitrate and Sulfamethoxazole-trimethoprim   ? ?Review of Systems   ?Review of Systems  ?Constitutional:  Positive for appetite change and fatigue. Negative for chills and fever.  ?Respiratory:  Positive for shortness of breath.   ?Cardiovascular:  Negative for chest pain, palpitations and leg swelling.  ?Gastrointestinal:  Positive for nausea and vomiting.  Negative for abdominal pain.  ?Genitourinary:  Negative for difficulty urinating.  ?Neurological:  Positive for weakness and light-headedness.  ?All other systems reviewed and are negative. ? ?Physical Exam ?Updated Vital Signs ?BP 123/72 (BP Location: Left Arm)   Pulse 80   Temp (!) 97.3 ?F (36.3 ?C) (Oral)   Resp 18   Ht '4\' 11"'$  (1.499 m)   Wt 62.6 kg   SpO2 97%   BMI 27.87 kg/m?  ?Physical Exam ?Vitals and nursing note reviewed.  ?Constitutional:   ?   General: She is not in acute distress. ?   Appearance: Normal appearance. She is not ill-appearing.  ?HENT:  ?   Head: Normocephalic and atraumatic.  ?   Nose: Nose normal.  ?Eyes:  ?   General: No scleral icterus. ?   Extraocular Movements: Extraocular movements intact.  ?   Conjunctiva/sclera: Conjunctivae normal.  ?Cardiovascular:  ?   Rate and Rhythm: Normal rate and regular rhythm.  ?   Pulses: Normal pulses.  ?   Heart sounds: Normal heart sounds.  ?Pulmonary:  ?   Effort: Pulmonary effort is normal. No respiratory distress.  ?   Breath sounds: Normal breath sounds. No wheezing.  ?   Comments: Coarse lung sounds throughout. ?Abdominal:  ?   General: There is no distension.  ?   Palpations: Abdomen is soft.  ?   Tenderness: There is no abdominal tenderness. There is no guarding.  ?Musculoskeletal:     ?   General: Normal range of motion.  ?   Cervical back: Normal range of motion.  ?   Right lower leg: No edema.  ?   Left lower leg: No edema.  ?Skin: ?   General: Skin is warm and dry.  ?Neurological:  ?   General: No focal deficit present.  ?   Mental Status: She is alert. Mental status is at baseline.  ? ? ?ED Results / Procedures / Treatments   ?Labs ?(all labs ordered are listed, but only abnormal results are displayed) ?Labs Reviewed  ?RESP PANEL BY RT-PCR (FLU A&B, COVID) ARPGX2  ?CBC WITH DIFFERENTIAL/PLATELET  ?URINALYSIS, ROUTINE W REFLEX MICROSCOPIC  ?COMPREHENSIVE METABOLIC PANEL  ?LIPASE, BLOOD  ?TROPONIN I (HIGH SENSITIVITY)  ? ? ?EKG ?EKG  Interpretation ? ?Date/Time:  Friday June 10 2021 18:11:46 EDT ?Ventricular Rate:  82 ?PR Interval:  175 ?QRS Duration: 104 ?QT Interval:  384 ?QTC Calculation: 449 ?R Axis:   -69 ?Text Interpretation: Sinus rhythm LAD, consider left anterior fascicular block Consider RVH w/ secondary repol abnormality Probable lateral infarct, old No significant change since last tracing Confirmed by Isla Pence [phone]) on 06/10/2021 6:19:14 PM ? ?Radiology ?No results found. ? ?Procedures ?Procedures  ? ? ?Medications Ordered in ED ?Medications - No data to display ? ?ED Course/ Medical Decision Making/ A&P ?  ?                        ?  Medical Decision Making ?Amount and/or Complexity of Data Reviewed ?Labs: ordered. ?Radiology: ordered. ? ?Risk ?Prescription drug management. ? ? ?Medical Decision Making / ED Course ? ? ?This patient presents to the ED for concern of decreased p.o. intake, shortness of breath, weakness, this involves an extensive number of treatment options, and is a complaint that carries with it a high risk of complications and morbidity.  The differential diagnosis includes pneumonia, UTI, ACS, failure to thrive, viral URI ? ?MDM: ?71 year old female presents today for evaluation of poor appetite, decreased p.o. intake of 1 week duration associated with lightheadedness nausea and vomiting.  She was diagnosed with UTI about 10 days ago by urologist however she did not complete her antibiotic course.  She states she only took 1 dose of the antibiotic.  She has not reached out to the urologist to notify them of this.  She states today she became significantly short of breath and significantly weak and was unable to ambulate.  She denies fever, chills, abdominal pain, lightheadedness.  She states she has been throwing up acid type mucus.  She states she is thrown up some blood as well as had some blood clots in the nose when she blew her nose.  Family is at bedside and states they have not seen either of these.   Will evaluate patient with UA, CBC with differential, CMP, lipase, chest x-ray, respiratory swab. ? ?CBC without leukocytosis, or anemia.  Unlikely to be having acute GI bleed.  Given vitals are stable and hemogl

## 2021-06-10 NOTE — ED Triage Notes (Signed)
Pt BIB EMS for generalized weakness for the past week.Patien ttold EMS she was trying to come to the ER herself in her own vehicle, but was took weak to walk down the stairs. Patient reports having poor PO intake N/V the past couple of weeks. She also states that she has been throwing up blood clots and has blood clots when she blows her nose.  ? ?VSS w/ EMS. Pt is on 2L [place] as RA was 85%. ?

## 2021-06-10 NOTE — ED Notes (Signed)
Ambulated PT in room on pulse oximetry. PT remained at 95% on room air throughout ambulation. PT tachypneic at a rate of 36 during the walking phase and endorsing dizziness. Respiration rate returned to 20 upon returning to bed. PT also tolerating water sips ?

## 2021-06-11 ENCOUNTER — Observation Stay (HOSPITAL_COMMUNITY): Payer: Medicare Other

## 2021-06-11 DIAGNOSIS — R55 Syncope and collapse: Secondary | ICD-10-CM

## 2021-06-11 DIAGNOSIS — I951 Orthostatic hypotension: Secondary | ICD-10-CM | POA: Diagnosis not present

## 2021-06-11 DIAGNOSIS — I25118 Atherosclerotic heart disease of native coronary artery with other forms of angina pectoris: Secondary | ICD-10-CM

## 2021-06-11 DIAGNOSIS — B9620 Unspecified Escherichia coli [E. coli] as the cause of diseases classified elsewhere: Secondary | ICD-10-CM

## 2021-06-11 DIAGNOSIS — R7401 Elevation of levels of liver transaminase levels: Secondary | ICD-10-CM

## 2021-06-11 DIAGNOSIS — R109 Unspecified abdominal pain: Secondary | ICD-10-CM | POA: Diagnosis not present

## 2021-06-11 DIAGNOSIS — J84112 Idiopathic pulmonary fibrosis: Secondary | ICD-10-CM

## 2021-06-11 DIAGNOSIS — R112 Nausea with vomiting, unspecified: Secondary | ICD-10-CM

## 2021-06-11 DIAGNOSIS — K219 Gastro-esophageal reflux disease without esophagitis: Secondary | ICD-10-CM

## 2021-06-11 DIAGNOSIS — N390 Urinary tract infection, site not specified: Secondary | ICD-10-CM

## 2021-06-11 DIAGNOSIS — B20 Human immunodeficiency virus [HIV] disease: Secondary | ICD-10-CM

## 2021-06-11 DIAGNOSIS — J9611 Chronic respiratory failure with hypoxia: Secondary | ICD-10-CM | POA: Diagnosis not present

## 2021-06-11 LAB — CBC
HCT: 39.5 % (ref 36.0–46.0)
Hemoglobin: 12.9 g/dL (ref 12.0–15.0)
MCH: 32.7 pg (ref 26.0–34.0)
MCHC: 32.7 g/dL (ref 30.0–36.0)
MCV: 100.3 fL — ABNORMAL HIGH (ref 80.0–100.0)
Platelets: 229 10*3/uL (ref 150–400)
RBC: 3.94 MIL/uL (ref 3.87–5.11)
RDW: 15.7 % — ABNORMAL HIGH (ref 11.5–15.5)
WBC: 7.8 10*3/uL (ref 4.0–10.5)
nRBC: 0 % (ref 0.0–0.2)

## 2021-06-11 LAB — TROPONIN I (HIGH SENSITIVITY): Troponin I (High Sensitivity): 7 ng/L (ref <18)

## 2021-06-11 LAB — COMPREHENSIVE METABOLIC PANEL
ALT: 24 U/L (ref 0–44)
AST: 67 U/L — ABNORMAL HIGH (ref 15–41)
Albumin: 2.9 g/dL — ABNORMAL LOW (ref 3.5–5.0)
Alkaline Phosphatase: 49 U/L (ref 38–126)
Anion gap: 10 (ref 5–15)
BUN: 13 mg/dL (ref 8–23)
CO2: 17 mmol/L — ABNORMAL LOW (ref 22–32)
Calcium: 8.3 mg/dL — ABNORMAL LOW (ref 8.9–10.3)
Chloride: 112 mmol/L — ABNORMAL HIGH (ref 98–111)
Creatinine, Ser: 0.73 mg/dL (ref 0.44–1.00)
GFR, Estimated: >60 mL/min (ref >60)
Glucose, Bld: 83 mg/dL (ref 70–99)
Potassium: 3.4 mmol/L — ABNORMAL LOW (ref 3.5–5.1)
Sodium: 139 mmol/L (ref 135–145)
Total Bilirubin: 1 mg/dL (ref 0.3–1.2)
Total Protein: 5.3 g/dL — ABNORMAL LOW (ref 6.5–8.1)

## 2021-06-11 LAB — VITAMIN B12: Vitamin B-12: 178 pg/mL — ABNORMAL LOW (ref 180–914)

## 2021-06-11 LAB — TYPE AND SCREEN
ABO/RH(D): A NEG
Antibody Screen: NEGATIVE

## 2021-06-11 LAB — STREP PNEUMONIAE URINARY ANTIGEN: Strep Pneumo Urinary Antigen: NEGATIVE

## 2021-06-11 LAB — TSH: TSH: 2.028 u[IU]/mL (ref 0.350–4.500)

## 2021-06-11 LAB — PROCALCITONIN: Procalcitonin: <0.1 ng/mL

## 2021-06-11 MED ORDER — ARFORMOTEROL TARTRATE 15 MCG/2ML IN NEBU
15.0000 ug | INHALATION_SOLUTION | Freq: Two times a day (BID) | RESPIRATORY_TRACT | Status: DC
  Administered 2021-06-11 – 2021-06-17 (×13): 15 ug via RESPIRATORY_TRACT
  Filled 2021-06-11 (×14): qty 2

## 2021-06-11 MED ORDER — SODIUM CHLORIDE 0.9% FLUSH
3.0000 mL | Freq: Two times a day (BID) | INTRAVENOUS | Status: DC
  Administered 2021-06-12 – 2021-06-17 (×7): 3 mL via INTRAVENOUS

## 2021-06-11 MED ORDER — DICLOFENAC SODIUM 1 % EX GEL: 2.0000 g | Freq: Two times a day (BID) | CUTANEOUS | Status: DC | PRN

## 2021-06-11 MED ORDER — DOLUTEGRAVIR SODIUM 50 MG PO TABS
50.0000 mg | ORAL_TABLET | Freq: Every day | ORAL | Status: DC
  Administered 2021-06-11: 50 mg via ORAL
  Filled 2021-06-11: qty 1

## 2021-06-11 MED ORDER — SODIUM CHLORIDE 0.9 % IV SOLN: INTRAVENOUS | Status: DC

## 2021-06-11 MED ORDER — ONDANSETRON HCL 4 MG/2ML IJ SOLN
4.0000 mg | Freq: Four times a day (QID) | INTRAMUSCULAR | Status: DC | PRN
Start: 2021-06-11 — End: 2021-06-17
  Administered 2021-06-11 – 2021-06-14 (×3): 4 mg via INTRAVENOUS
  Filled 2021-06-11 (×3): qty 2

## 2021-06-11 MED ORDER — DOLUTEGRAVIR-LAMIVUDINE 50-300 MG PO TABS
1.0000 | ORAL_TABLET | Freq: Every day | ORAL | Status: DC
  Filled 2021-06-11: qty 1

## 2021-06-11 MED ORDER — ALBUTEROL SULFATE (2.5 MG/3ML) 0.083% IN NEBU
2.5000 mg | INHALATION_SOLUTION | RESPIRATORY_TRACT | Status: DC | PRN
  Administered 2021-06-11: 2.5 mg via RESPIRATORY_TRACT
  Filled 2021-06-11: qty 3

## 2021-06-11 MED ORDER — IOHEXOL 300 MG/ML  SOLN
100.0000 mL | Freq: Once | INTRAMUSCULAR | Status: AC | PRN
  Administered 2021-06-11: 100 mL via INTRAVENOUS

## 2021-06-11 MED ORDER — DOLUTEGRAVIR-LAMIVUDINE 50-300 MG PO TABS: 1.0000 | ORAL_TABLET | Freq: Every day | ORAL | Status: DC

## 2021-06-11 MED ORDER — LAMIVUDINE 150 MG PO TABS
300.0000 mg | ORAL_TABLET | Freq: Every day | ORAL | Status: DC
  Administered 2021-06-11: 300 mg via ORAL
  Filled 2021-06-11: qty 2

## 2021-06-11 MED ORDER — ATORVASTATIN CALCIUM 80 MG PO TABS
80.0000 mg | ORAL_TABLET | Freq: Every day | ORAL | Status: DC
  Administered 2021-06-11 – 2021-06-16 (×6): 80 mg via ORAL
  Filled 2021-06-11 (×6): qty 1

## 2021-06-11 MED ORDER — SODIUM CHLORIDE 0.9 % IV BOLUS
500.0000 mL | INTRAVENOUS | Status: AC
  Administered 2021-06-11: 500 mL via INTRAVENOUS

## 2021-06-11 MED ORDER — POLYETHYLENE GLYCOL 3350 17 G PO PACK: 17.0000 g | PACK | Freq: Every day | ORAL | Status: DC | PRN

## 2021-06-11 MED ORDER — ONDANSETRON HCL 4 MG PO TABS: 4.0000 mg | ORAL_TABLET | Freq: Four times a day (QID) | ORAL | Status: DC | PRN

## 2021-06-11 MED ORDER — IOHEXOL 9 MG/ML PO SOLN
ORAL | Status: AC
Start: 2021-06-11 — End: 2021-06-11
  Filled 2021-06-11: qty 1000

## 2021-06-11 MED ORDER — BUDESONIDE 0.5 MG/2ML IN SUSP
0.5000 mg | Freq: Two times a day (BID) | RESPIRATORY_TRACT | Status: DC
  Administered 2021-06-11 – 2021-06-17 (×13): 0.5 mg via RESPIRATORY_TRACT
  Filled 2021-06-11 (×14): qty 2

## 2021-06-11 MED ORDER — PANTOPRAZOLE SODIUM 40 MG IV SOLR
40.0000 mg | Freq: Once | INTRAVENOUS | Status: AC
  Administered 2021-06-11: 40 mg via INTRAVENOUS
  Filled 2021-06-11: qty 10

## 2021-06-11 MED ORDER — TRAZODONE HCL 50 MG PO TABS
50.0000 mg | ORAL_TABLET | Freq: Every day | ORAL | Status: DC
  Administered 2021-06-11 – 2021-06-16 (×6): 50 mg via ORAL
  Filled 2021-06-11 (×6): qty 1

## 2021-06-11 MED ORDER — PANTOPRAZOLE SODIUM 40 MG PO TBEC
40.0000 mg | DELAYED_RELEASE_TABLET | Freq: Two times a day (BID) | ORAL | Status: DC
  Administered 2021-06-11 – 2021-06-17 (×12): 40 mg via ORAL
  Filled 2021-06-11 (×12): qty 1

## 2021-06-11 NOTE — Progress Notes (Addendum)
NEW ADMISSION NOTE ?New Admission Note:  ? ?Arrival Method: E.D stretcher bed ?Mental Orientation: Alert and oriented x 4 ?Telemetry:NSR ?Assessment: Completed ?Skin:Blanchable redness and sacrum,MASD underneath on both breast.Assessed with Darylene Price R.N. ?CX:FQHKU a/c NSL  and left forearm -NS '@75CC'$ /HR ?Pain Denies ?Tubes:None ?Safety Measures: Safety Fall Prevention Plan has been given, discussed and signed ?Admission: Completed ?[facility] Orientation: Patient has been orientated to the room, unit and staff.  ?Family:None ? ?Orders have been reviewed and implemented. Will continue to monitor the patient. Call light has been placed within reach and bed alarm has been activated.  ? ?Rogelia Mire, RN   ?

## 2021-06-11 NOTE — ED Notes (Signed)
Pt finished first bottle of oral contrast.

## 2021-06-11 NOTE — Assessment & Plan Note (Signed)
Acute.  On admission AST elevated at 73.  Unclear cause at this time. ?-Continue to monitor ?

## 2021-06-11 NOTE — ED Provider Notes (Signed)
Patient is a 71 year old female whose care was transferred to me at shift change from [facility].  Please see his HPI below: ? ?Jazalyn MAILYNN EVERLY is a 71 y.o. female. ?  ?71 year old female with past medical history of HIV, CAD, GERD, pulmonary fibrosis, asthma presents via EMS for evaluation of weakness and shortness of breath.  She states that she has been weak over the past week that has been progressively worsening.  She states her weakness significantly worsened today and has been unable to get around the house.  She also states that her shortness of breath started today.  She endorses cough.  She denies fever, chills.  She was recently diagnosed with UTI and started on antibiotics.  She states she took 1 dose and stopped taking the antibiotics because she read the pamphlet which said there is significant amount of side effects that correlated with her medical history.  She states since then she has not reached out to her urologist or taking the antibiotics.  She also states that she has been nauseous and has vomited multiple times following meals.  States recently she has been throwing up acid and what she perceives to be blood.  She states she also blew her nose and had a couple blood clots come out.  Family at bedside have not witnessed this.  She denies hemoptysis. ?  ?The history is provided by the patient. No language interpreter was used.  ?Physical Exam  ?BP 114/65   Pulse 89   Temp (!) 97.3 ?F (36.3 ?C) (Oral)   Resp 20   Ht '4\' 11"'$  (1.499 m)   Wt 62.6 kg   SpO2 100%   BMI 27.87 kg/m?  ? ?Physical Exam ?Vitals and nursing note reviewed.  ?Constitutional:   ?   General: She is not in acute distress. ?   Appearance: She is well-developed.  ?HENT:  ?   Head: Normocephalic and atraumatic.  ?   Right Ear: External ear normal.  ?   Left Ear: External ear normal.  ?Eyes:  ?   General: No scleral icterus.    ?   Right eye: No discharge.     ?   Left eye: No discharge.  ?   Conjunctiva/sclera: Conjunctivae  normal.  ?Neck:  ?   Trachea: No tracheal deviation.  ?Cardiovascular:  ?   Rate and Rhythm: Normal rate.  ?Pulmonary:  ?   Effort: Pulmonary effort is normal. No respiratory distress.  ?   Breath sounds: No stridor.  ?Abdominal:  ?   General: Abdomen is flat. There is no distension.  ?Musculoskeletal:     ?   General: No swelling or deformity.  ?   Cervical back: Neck supple.  ?Skin: ?   General: Skin is warm and dry.  ?   Findings: No rash.  ?Neurological:  ?   Mental Status: She is alert.  ?   Cranial Nerves: Cranial nerve deficit: no gross deficits.  ? ?Procedures  ?Procedures ? ?ED Course / MDM  ?  ?Medical Decision Making ?Amount and/or Complexity of Data Reviewed ?Labs: ordered. ?Radiology: ordered. ? ?Risk ?Prescription drug management. ?Decision regarding hospitalization. ? ?Patient is a 71 year old female whose care was transferred to me at shift change from [facility].  Please see his note for additional information.  In summary, patient presents today due to poor appetite, decreased p.o. intake for the past week associated with lightheadedness, nausea, and vomiting.  She was diagnosed with a UTI about 10 days  ago by her urologist but did not complete her antibiotics.  At the time of shift change patient was found to be orthostatic and was pending IV fluid bolus. ? ?Patient has since received a liter of IV fluids.  We ambulated and her oxygen saturation stayed above 95% but is still having difficulty with lightheadedness as well as standing.  Due to her persistent orthostasis feel that admission is warranted for further management.  We will discuss with the medicine team. ?  ?Rayna Sexton, PA-C ?06/11/21 1062 ? ?  ?Maudie Flakes, MD ?06/11/21 [phone] ? ?

## 2021-06-11 NOTE — Assessment & Plan Note (Addendum)
Prior to arrival.  Patient had been seen by Dr. Aundra Dubin of urology last week and culture reportedly had grown out E. coli although urinalysis did not show significant signs of infection.  She was recommended start on Macrobid and took 1-2 doses prior to discontinuing due to possible interactions with her other medicines.  Urinalysis did not show any significant signs of infection. ?-Check urine culture ?

## 2021-06-11 NOTE — Assessment & Plan Note (Addendum)
Patient reports having chest pain but points epigastrically.  Left quadrant/lower abdominal pain with nausea and vomiting.  Emesis reported to possibly have blood present.  Hemoglobin within normal limits and vital signs currently stable. ?-Aspiration precautions with elevation of head of the bed ?-Monitor intake and output ?-Type and screen for possible need of blood products  ?-Check stool guaiac ?-Check troponin ?-Check CT scan of the abdomen and pelvis with contrast ?-Antiemetics as needed ?-Clear liquid diet and will consider advancing if vomiting resolves and no signs of bleeding ?

## 2021-06-11 NOTE — ED Notes (Signed)
Patient transported to CT 

## 2021-06-11 NOTE — Assessment & Plan Note (Addendum)
Patient complained of shortness of breath but denied having any significant cough.  At baseline patient is supposed to be on 2- 3 L at all times.  Reported to be hypoxic down to 85% on room air upon EMS arrival.  She notes that her oxygen tank cylinders had not been functioning properly and needed to have them fixed.  O2 saturations currently maintained on home 2 L.  Noted to have some mild rales, but no significant crackles or wheezes appreciated.  Has diagnosis of idiopathic pulmonary fibrosis and prior Legionella pneumonia.  She is followed by pulmonology at the [facility].  Influenza and COVID-19 screening were negative.  ?-Continue nasal cannula oxygen maintain O2 saturation greater than 92% ?-Substituted Brovana and budesonide for Symbicort ?-Albuterol nebs as needed ?

## 2021-06-11 NOTE — Assessment & Plan Note (Addendum)
-  Continue Dovato. 

## 2021-06-11 NOTE — ED Notes (Signed)
Pt is NSR on monitor 

## 2021-06-11 NOTE — H&P (Addendum)
?History and Physical  ? ? ?Patient: [name] [name] IWL:[id number] DOB: Jul 29, 1950 ?DOA: 06/10/2021 ?DOS: the patient was seen and examined on 06/11/2021 ?PCP: [place]  ?Patient coming from: Home(lives with daughter) via EMS ? ?Chief Complaint:  ?Chief Complaint  ?Patient presents with  ? Weakness  ? ?HPI: [name] [name] is a 71 y.o. female with medical history significant of HIV, CAD, pulmonary fibrosis, asthma, chronic respiratory failure on 2-3 L of oxygen, and GERD presents with complaints of weakness.  She normally gets around with use of a cane but also has a walker available when needed. Symptoms started 1-2 weeks ago.  Whenever she was getting up and moving around she would feel lightheaded, dizzy, and her legs would give out.  She was usually able to catch her self from falling, never lost consciousness, and denies sustaining any injuries.  Associated symptoms include chest pain(patient points epigastrically), sharp left-sided/ower abdominal pain, nausea, vomiting, and worsening shortness of breath without complaints of any significant cough.  Emesis was reported to be of dark dried blood or possibly clots.  When she blew her nose he also blew out some blood clots.  She denies any use of ibuprofen or reports of dark/black stools.  Her last bowel movement was approximately 4 days ago which is normal for her her.  She is on aspirin and Plavix.  Over the last 2 days she got to the point in which she was unable to move around the house due to her symptoms. Patient notes that she had been having problems with the cylinders on her oxygen tank, and that they ran out yesterday.  Patient had been seen by Dr. Aundra Dubin of urology last week for issues with incontinence/overactive bladder.  At that time urinalysis did not show any signs of infection, but urine culture was positive for E. coli.  She had been prescribed Macrobid to take for 5 days.  She was only took 1-2 doses of the medication before she  self discontinued it due to its possible side effects with her other medications.  Denies having any dysuria ? ?In route with EMS patient was reported to have O2 saturations as low as 85% on room air and was placed on 2 L of nasal cannula oxygen with improvement. ? ?Upon admission patient was noted to be afebrile, respirations 17-27, and all other vital signs maintained.  Labs significant for MCV 100.9 without anemia, CO2 20, BUN 12, creatinine 0.7, glucose 106, lipase 26, AST 73, ALT 29, and high sensitivity troponins negative x2.  Influenza and COVID-19 screening were negative.  Chest x-ray was clear.  Urinalysis noted ketones,  specific gravity of 1.023, and did not show any signs concerning for infection. ? ?Review of Systems: As mentioned in the history of present illness. All other systems reviewed and are negative. ?Past Medical History:  ?Diagnosis Date  ? Asthma   ? Bradycardia   ? Sinus bradycardia  ? CAD (coronary artery disease)   ? Minimal by history, Question coronary artery spasm  ? Colon polyps   ? Dyslipidemia   ? GERD (gastroesophageal reflux disease)   ? Esophageal stricture  ? H/O: hysterectomy   ? Hiatal hernia   ? HIV positive ([place])   ? Needle stick 1993  ? Hypertension   ? Palpitations   ? Peptic ulcer disease   ? Pulmonary fibrosis ([place])   ? on 2L home O2, prn at night  ? Shortness of breath   ? Extensive evaluation  Dr. Winona Legato, [facility], May, 7416, etiology uncertain, probable bronchiolitis possibility of lung biopsy at that time  ? Weight loss   ? ?Past Surgical History:  ?Procedure Laterality Date  ? ABDOMINAL HYSTERECTOMY    ? APPENDECTOMY    ? CARDIAC CATHETERIZATION N/A 03/21/2016  ? Procedure: Right/Left Heart Cath and Coronary Angiography;  Surgeon: Belva Crome, MD;  Location: [facility];  Service: Cardiovascular;  Laterality: N/A;  ? CHOLECYSTECTOMY    ? CORONARY STENT PLACEMENT  12/2017  ? GASTRECTOMY    ? VENTRAL HERNIA REPAIR    ? ?Social History:  reports that she quit  smoking about 25 years ago. She has never used smokeless tobacco. She reports that she does not drink alcohol and does not use drugs. ? ?Allergies  ?Allergen Reactions  ? Isosorbide Nitrate Other (See Comments)  ?  Headaches  ? Sulfamethoxazole-Trimethoprim Hives and Rash  ? ? ?Family History  ?Problem Relation Age of Onset  ? Coronary artery disease Other   ?     family hx of on mother, father and siblings  ? Bradycardia Sister   ? Diabetes Mother   ? Heart attack Mother   ? Heart disease Mother   ? Hypertension Mother   ? Stroke Mother   ? Bradycardia Brother   ? ? ?Prior to Admission medications   ?Medication Sig Start Date End Date Taking? Authorizing Provider  ?albuterol (VENTOLIN HFA) 108 (90 Base) MCG/ACT inhaler Inhale 1-2 puffs into the lungs every 6 (six) hours as needed for wheezing or shortness of breath.   Yes [provider]  ?aspirin EC 81 MG tablet Take 1 tablet (81 mg total) by mouth daily. 03/17/16  Yes Dorothy Spark, MD  ?atorvastatin (LIPITOR) 80 MG tablet Take 80 mg by mouth daily at 6 PM.   Yes [provider]  ?budesonide-formoterol (SYMBICORT) 160-4.5 MCG/ACT inhaler Inhale 2 puffs into the lungs 2 (two) times daily.    Yes [provider]  ?clopidogrel (PLAVIX) 75 MG tablet Take 75 mg by mouth daily.   Yes [provider]  ?conjugated estrogens (PREMARIN) vaginal cream Place 3.84 Applicatorfuls vaginally every Wednesday. Pt only to use 1/4 of the applicator    Yes [provider]  ?diclofenac Sodium (VOLTAREN) 1 % GEL Apply 2-4 g topically 2 (two) times daily as needed (joint pain). 05/18/21  Yes [provider]  ?dolutegravir-lamiVUDine (DOVATO) 50-300 MG tablet Take 1 tablet by mouth daily.   Yes [provider]  ?furosemide (LASIX) 20 MG tablet Take 20 mg by mouth daily as needed for fluid. 06/01/21  Yes [provider]  ?lidocaine (XYLOCAINE) 2 % solution Use as directed 15 mLs in the mouth or throat every 6 (six)  hours as needed (sore throat). 08/28/19  Yes Cardama, Grayce Sessions, MD  ?losartan (COZAAR) 25 MG tablet Take 12.5 mg by mouth daily. 04/08/21  Yes [provider]  ?metoprolol succinate (TOPROL-XL) 25 MG 24 hr tablet Take 12.5 mg by mouth daily. 05/05/21  Yes [provider]  ?nitroGLYCERIN (NITROSTAT) 0.4 MG SL tablet Place 1 tablet (0.4 mg total) under the tongue every 5 (five) minutes as needed for chest pain. 03/21/16  Yes Belva Crome, MD  ?omeprazole (PRILOSEC) 40 MG capsule Take 1 capsule (40 mg total) by mouth 2 (two) times daily. 07/07/19 06/11/21 Yes Kayleen Memos, DO  ?pantoprazole (PROTONIX) 40 MG tablet Take 40 mg by mouth daily. 05/24/21  Yes [provider]  ?polyethylene glycol (  MIRALAX / GLYCOLAX) packet Take 17 g by mouth daily as needed (constipation).    Yes [provider]  ?ranolazine (RANEXA) 500 MG 12 hr tablet Take 500 mg by mouth 2 (two) times daily. 05/16/21  Yes [provider]  ?traZODone (DESYREL) 50 MG tablet Take 50 mg by mouth at bedtime. 05/04/21  Yes [provider]  ?trospium (SANCTURA) 20 MG tablet Take 20 mg by mouth 2 (two) times daily. 05/30/21  Yes [provider]  ?nitrofurantoin, macrocrystal-monohydrate, (MACROBID) 100 MG capsule Take 100 mg by mouth See admin instructions. Bid x 5 days ?Patient not taking: Reported on 06/11/2021 06/02/21   [provider]  ? ? ?Physical Exam: ?Vitals:  ? 06/11/21 0200 06/11/21 0245 06/11/21 0725 06/11/21 0728  ?BP: 101/62 114/65 (!) 119/59   ?Pulse: 89 89 86   ?Resp: 18 20 (!) 21   ?Temp:    97.6 ?F (36.4 ?C)  ?TempSrc:    Oral  ?SpO2: 98% 100% 100%   ?Weight:      ?Height:      ? ?Constitutional: Ill-appearing elderly female currently in no acute distress ?Eyes: PERRL ?ENMT: Mucous membranes are dry.  No significant erythema ?Neck: normal, supple, no thyromegaly, or tenderness to palpation ?Respiratory: Normal respiratory effort with intermittent rales.  O2 saturation current  maintained on home 2 L of oxygen. ?Cardiovascular: Regular rate and rhythm, no murmurs / rubs / gallops. No extremity edema.    ?Abdomen: Tenderness to palpation of the left quadrant and lower abdomen.  B

## 2021-06-11 NOTE — Assessment & Plan Note (Addendum)
Patient with prior history of dysphagia with schatizi ring status post dilation  home medication regimen includes Protonix 40 mg daily and reportedly states she was told not to take the omeprazole. ?-Protonix 40 mg IV x1 dose, then make twice daily p.o. due to reports of possible bleeding ?

## 2021-06-11 NOTE — Assessment & Plan Note (Addendum)
Patient presents with complaints of weakness and reports of near syncope.  Denies ever losing consciousness.  Home blood pressure regimen includes furosemide 20 mg daily as needed for fluid retention, losartan 12.5 mg daily, metoprolol succinate 12.5 mg daily, and Ranexa 500 mg twice daily.  She was given 1 L of IV fluids in the ED and still noted to be orthostatic which admission was required.  Urinalysis noted a specific gravity of the upper limit of normal.  Suspect the patient dehydrated given reports of nausea, vomiting, and decreased p.o. intake. ?-Admit to a MedSurg bed ?-Check TSH ?-Hold home blood pressure regimen at this time. ?-Bolus 500 mL of normal saline IV fluids, and then placed on rate of 75 mL/h ?-Recheck orthostatic vital signs ?-PT to eval and treat ?

## 2021-06-11 NOTE — Assessment & Plan Note (Addendum)
Patient had complained of some chest pain, but pointed epigastrically.  EKG without significant ischemic changes.  Prior history of PCI to the mid LAD in 12/2017 and left circumflex thrombolysis and 04/2020.  High-sensitivity troponins were negative x2 on the on 3/31. ?-Check troponin ?-Held Plavix and aspirin due to reports of bleeding.  Resume if no signs of bleeding. ?

## 2021-06-11 NOTE — ED Notes (Signed)
Pt O2 maintained 95-100% while ambulating. Pt experienced dyspnea with exertion and reported dizziness. Joldersma PA made aware.  ?

## 2021-06-12 ENCOUNTER — Inpatient Hospital Stay (HOSPITAL_COMMUNITY): Payer: Medicare Other

## 2021-06-12 DIAGNOSIS — R7401 Elevation of levels of liver transaminase levels: Secondary | ICD-10-CM | POA: Diagnosis present

## 2021-06-12 DIAGNOSIS — R109 Unspecified abdominal pain: Secondary | ICD-10-CM | POA: Diagnosis not present

## 2021-06-12 DIAGNOSIS — D539 Nutritional anemia, unspecified: Secondary | ICD-10-CM | POA: Diagnosis present

## 2021-06-12 DIAGNOSIS — N390 Urinary tract infection, site not specified: Secondary | ICD-10-CM | POA: Diagnosis present

## 2021-06-12 DIAGNOSIS — Z7902 Long term (current) use of antithrombotics/antiplatelets: Secondary | ICD-10-CM | POA: Diagnosis not present

## 2021-06-12 DIAGNOSIS — J45909 Unspecified asthma, uncomplicated: Secondary | ICD-10-CM | POA: Diagnosis present

## 2021-06-12 DIAGNOSIS — I951 Orthostatic hypotension: Secondary | ICD-10-CM | POA: Diagnosis present

## 2021-06-12 DIAGNOSIS — I10 Essential (primary) hypertension: Secondary | ICD-10-CM | POA: Diagnosis present

## 2021-06-12 DIAGNOSIS — B20 Human immunodeficiency virus [HIV] disease: Secondary | ICD-10-CM | POA: Diagnosis present

## 2021-06-12 DIAGNOSIS — Z20822 Contact with and (suspected) exposure to covid-19: Secondary | ICD-10-CM | POA: Diagnosis present

## 2021-06-12 DIAGNOSIS — Z7982 Long term (current) use of aspirin: Secondary | ICD-10-CM | POA: Diagnosis not present

## 2021-06-12 DIAGNOSIS — R9431 Abnormal electrocardiogram [ECG] [EKG]: Secondary | ICD-10-CM | POA: Diagnosis not present

## 2021-06-12 DIAGNOSIS — J84112 Idiopathic pulmonary fibrosis: Secondary | ICD-10-CM | POA: Diagnosis present

## 2021-06-12 DIAGNOSIS — B9620 Unspecified Escherichia coli [E. coli] as the cause of diseases classified elsewhere: Secondary | ICD-10-CM | POA: Diagnosis present

## 2021-06-12 DIAGNOSIS — E538 Deficiency of other specified B group vitamins: Secondary | ICD-10-CM

## 2021-06-12 DIAGNOSIS — I25118 Atherosclerotic heart disease of native coronary artery with other forms of angina pectoris: Secondary | ICD-10-CM | POA: Diagnosis not present

## 2021-06-12 DIAGNOSIS — I25110 Atherosclerotic heart disease of native coronary artery with unstable angina pectoris: Secondary | ICD-10-CM | POA: Diagnosis present

## 2021-06-12 DIAGNOSIS — Z86010 Personal history of colonic polyps: Secondary | ICD-10-CM | POA: Diagnosis not present

## 2021-06-12 DIAGNOSIS — K219 Gastro-esophageal reflux disease without esophagitis: Secondary | ICD-10-CM | POA: Diagnosis present

## 2021-06-12 DIAGNOSIS — K920 Hematemesis: Secondary | ICD-10-CM | POA: Diagnosis present

## 2021-06-12 DIAGNOSIS — J9611 Chronic respiratory failure with hypoxia: Secondary | ICD-10-CM | POA: Diagnosis present

## 2021-06-12 DIAGNOSIS — Z7952 Long term (current) use of systemic steroids: Secondary | ICD-10-CM | POA: Diagnosis not present

## 2021-06-12 DIAGNOSIS — Z888 Allergy status to other drugs, medicaments and biological substances status: Secondary | ICD-10-CM | POA: Diagnosis not present

## 2021-06-12 DIAGNOSIS — E785 Hyperlipidemia, unspecified: Secondary | ICD-10-CM | POA: Diagnosis present

## 2021-06-12 DIAGNOSIS — R32 Unspecified urinary incontinence: Secondary | ICD-10-CM | POA: Diagnosis present

## 2021-06-12 DIAGNOSIS — N3281 Overactive bladder: Secondary | ICD-10-CM | POA: Diagnosis present

## 2021-06-12 DIAGNOSIS — Z79899 Other long term (current) drug therapy: Secondary | ICD-10-CM | POA: Diagnosis not present

## 2021-06-12 DIAGNOSIS — I959 Hypotension, unspecified: Secondary | ICD-10-CM | POA: Diagnosis present

## 2021-06-12 LAB — CBC WITH DIFFERENTIAL/PLATELET
Abs Immature Granulocytes: 0.06 10*3/uL (ref 0.00–0.07)
Basophils Absolute: 0 10*3/uL (ref 0.0–0.1)
Basophils Relative: 1 %
Eosinophils Absolute: 0.1 10*3/uL (ref 0.0–0.5)
Eosinophils Relative: 1 %
HCT: 33.1 % — ABNORMAL LOW (ref 36.0–46.0)
Hemoglobin: 11 g/dL — ABNORMAL LOW (ref 12.0–15.0)
Immature Granulocytes: 1 %
Lymphocytes Relative: 22 %
Lymphs Abs: 1.4 10*3/uL (ref 0.7–4.0)
MCH: 33.3 pg (ref 26.0–34.0)
MCHC: 33.2 g/dL (ref 30.0–36.0)
MCV: 100.3 fL — ABNORMAL HIGH (ref 80.0–100.0)
Monocytes Absolute: 1 10*3/uL (ref 0.1–1.0)
Monocytes Relative: 16 %
Neutro Abs: 3.8 10*3/uL (ref 1.7–7.7)
Neutrophils Relative %: 59 %
Platelets: 169 10*3/uL (ref 150–400)
RBC: 3.3 MIL/uL — ABNORMAL LOW (ref 3.87–5.11)
RDW: 15.8 % — ABNORMAL HIGH (ref 11.5–15.5)
WBC: 6.4 10*3/uL (ref 4.0–10.5)
nRBC: 0 % (ref 0.0–0.2)

## 2021-06-12 LAB — COMPREHENSIVE METABOLIC PANEL
ALT: 23 U/L (ref 0–44)
AST: 56 U/L — ABNORMAL HIGH (ref 15–41)
Albumin: 2.5 g/dL — ABNORMAL LOW (ref 3.5–5.0)
Alkaline Phosphatase: 44 U/L (ref 38–126)
Anion gap: 7 (ref 5–15)
BUN: 6 mg/dL — ABNORMAL LOW (ref 8–23)
CO2: 20 mmol/L — ABNORMAL LOW (ref 22–32)
Calcium: 8 mg/dL — ABNORMAL LOW (ref 8.9–10.3)
Chloride: 116 mmol/L — ABNORMAL HIGH (ref 98–111)
Creatinine, Ser: 0.64 mg/dL (ref 0.44–1.00)
GFR, Estimated: >60 mL/min (ref >60)
Glucose, Bld: 84 mg/dL (ref 70–99)
Potassium: 3.5 mmol/L (ref 3.5–5.1)
Sodium: 143 mmol/L (ref 135–145)
Total Bilirubin: 0.9 mg/dL (ref 0.3–1.2)
Total Protein: 4.8 g/dL — ABNORMAL LOW (ref 6.5–8.1)

## 2021-06-12 LAB — CORTISOL: Cortisol, Plasma: 13.1 ug/dL

## 2021-06-12 LAB — LACTIC ACID, PLASMA
Lactic Acid, Venous: 0.9 mmol/L (ref 0.5–1.9)
Lactic Acid, Venous: 1.8 mmol/L (ref 0.5–1.9)

## 2021-06-12 LAB — TROPONIN I (HIGH SENSITIVITY): Troponin I (High Sensitivity): 4 ng/L (ref <18)

## 2021-06-12 MED ORDER — COSYNTROPIN 0.25 MG IJ SOLR: 0.2500 mg | Freq: Once | INTRAMUSCULAR | Status: DC

## 2021-06-12 MED ORDER — POTASSIUM CHLORIDE CRYS ER 20 MEQ PO TBCR
40.0000 meq | EXTENDED_RELEASE_TABLET | Freq: Once | ORAL | Status: AC
  Administered 2021-06-12: 40 meq via ORAL
  Filled 2021-06-12: qty 2

## 2021-06-12 MED ORDER — LACTATED RINGERS IV SOLN: INTRAVENOUS | Status: DC

## 2021-06-12 MED ORDER — CYANOCOBALAMIN 1000 MCG/ML IJ SOLN
1000.0000 ug | Freq: Once | INTRAMUSCULAR | Status: AC
  Administered 2021-06-12: 1000 ug via INTRAMUSCULAR
  Filled 2021-06-12: qty 1

## 2021-06-12 MED ORDER — SODIUM CHLORIDE 0.9 % IV BOLUS
500.0000 mL | Freq: Once | INTRAVENOUS | Status: AC
  Administered 2021-06-12: 500 mL via INTRAVENOUS

## 2021-06-12 MED ORDER — DOLUTEGRAVIR SODIUM 50 MG PO TABS
50.0000 mg | ORAL_TABLET | Freq: Every day | ORAL | Status: DC
  Administered 2021-06-12 – 2021-06-17 (×6): 50 mg via ORAL
  Filled 2021-06-12 (×6): qty 1

## 2021-06-12 MED ORDER — COSYNTROPIN 0.25 MG IJ SOLR
0.2500 mg | Freq: Once | INTRAMUSCULAR | Status: AC
  Administered 2021-06-13: 0.25 mg via INTRAVENOUS
  Filled 2021-06-12: qty 0.25

## 2021-06-12 MED ORDER — LAMIVUDINE 150 MG PO TABS
300.0000 mg | ORAL_TABLET | Freq: Every day | ORAL | Status: DC
  Administered 2021-06-12 – 2021-06-17 (×6): 300 mg via ORAL
  Filled 2021-06-12 (×6): qty 2

## 2021-06-12 NOTE — Plan of Care (Signed)
  Problem: Clinical Measurements: Goal: Respiratory complications will improve Outcome: Adequate for Discharge   

## 2021-06-12 NOTE — Progress Notes (Signed)
?   06/12/21 0650  ?Provider Notification  ?Provider Name/Title Dr. Hal Hope  ?Date Provider Notified 06/12/21  ?Time Provider Notified [phone]  ?Method of Notification Page  ?Notification Reason Change in status ?(B/P 88/57 (MAP 67) HR 75)  ?Provider response See new orders  ?Date of Provider Response 06/12/21  ? ?Dr. Hal Hope made aware of patient's AM VS.  Patient is feeling weak.  New orders received and initiated.  Will continue to monitor patient.  Earleen Reaper RN ?

## 2021-06-12 NOTE — Progress Notes (Signed)
?PROGRESS NOTE ? ?[name] [name]:[id number] DOB: 07/21/1950 DOA: 06/10/2021 ?PCP: [place] ? ? LOS: 0 days  ? ?Brief Narrative / Interim history: ?71 year old female with history of HIV, CAD, pulmonary fibrosis with chronic respiratory failure, comes into the hospital with lightheadedness, weakness, dizziness progressively worse over the past week.  Symptoms are worse whenever she gets up and starts moving around.  She also been having nausea, vomiting and poor p.o. intake over the last week.  She reports that she saw couple blood clots in her emesis as well as some dried blood clots when blowing her nose.  She also have been having problems with home oxygen and her tanks ran out yesterday. ? ?Subjective / 24h Interval events: ?She is feeling quite weak this morning, had a hypotensive episode earlier this morning.  Denies any chest pain, no nausea at this morning. ? ?Assesement and Plan: ?Principal Problem: ?  Orthostatic hypotension near syncope ?Active Problems: ?  Near syncope ?  Nausea & vomiting abdominal pain ?  Chronic respiratory failure with hypoxia (HCC) pulmonary fibrosis asthma ?  CAD (coronary artery disease) ?  E. coli UTI ?  GERD (gastroesophageal reflux disease) ?  IPF (idiopathic pulmonary fibrosis) ([place]) ?  HIV (human immunodeficiency virus infection) ([place]) ?  Abdominal pain ?  Elevated AST (SGOT) ? ? ?Assessment and Plan: ?Principal problem ?Orthostatic hypotension near syncope -Patient presents with complaints of weakness and reports of near syncope.  She denies passing out/loss of consciousness.  She remains quite hypertensive this morning, symptomatic, bolus half a liter and continue to monitor.  Home blood pressure regimen includes Lasix, Toprol, Ranexa.  TSH is unremarkable, check cortisol as she has been on and off steroids for her ILD ? ?Active problems ?B12 deficiency-B12 levels low.  Supplement IM.  She reports numbness in her lower extremity as well as poor  balance, suspect this contributes but does not explain her hypotension ? ?Chronic respiratory failure with hypoxia ([place]) pulmonary fibrosis asthma -she is complaining of shortness of breath but feels somewhat at baseline.  She was hypoxic on room air but stable on 2-3 L at this time.  Continue to monitor.  Continue breathing treatments ? ?Questionable hematemesis-monitor, hold home Plavix, hemoglobin has remained stable but expected to drop a little bit as she is receiving fluids ? ?Nausea & vomiting abdominal pain -check cortisol as above.  CT scan of the abdomen and pelvis fairly unremarkable.  Continue clear liquid diet, advance as tolerated ? ?E. coli UTI -Prior to arrival.  Patient had been seen by Dr. Aundra Dubin of urology last week and culture reportedly had grown out E. coli although urinalysis did not show significant signs of infection.  She was recommended start on Macrobid and took 1-2 doses prior to discontinuing due to possible interactions with her other medicines.  Urinalysis did not show any significant signs of infection. ?-Check urine culture ? ?CAD (coronary artery disease) -Patient had complained of some chest pain, but pointed epigastrically.  EKG without significant ischemic changes.  Prior history of PCI to the mid LAD in 12/2017 and left circumflex thrombolysis and 04/2020.  High-sensitivity troponins negative x3 ? ?Elevated AST (SGOT)-mild, monitor.  Unclear significance.  Not a drinker ? ?HIV (human immunodeficiency virus infection) (HCC)-Continue Dovato ? ?GERD (gastroesophageal reflux disease) -Patient with prior history of dysphagia with schatizi ring status post dilation  home medication regimen includes Protonix 40 mg daily and reportedly states she was told not to take the omeprazole. ? ?  Hyperlipidemia-continue statin ? ?Scheduled Meds: ? arformoterol  15 mcg Nebulization BID  ? atorvastatin  80 mg Oral q1800  ? budesonide (PULMICORT) nebulizer solution  0.5 mg Nebulization BID  ?  cyanocobalamin  1,000 mcg Intramuscular Once  ? dolutegravir-lamiVUDine  1 tablet Oral Daily  ? pantoprazole  40 mg Oral BID  ? potassium chloride  40 mEq Oral Once  ? sodium chloride flush  3 mL Intravenous Q12H  ? traZODone  50 mg Oral QHS  ? ?Continuous Infusions: ? sodium chloride 75 mL/hr at 06/11/21 2136  ? ?PRN Meds:.albuterol, diclofenac Sodium, ondansetron **OR** ondansetron (ZOFRAN) IV, polyethylene glycol ? ?Diet Orders (From admission, onward)  ? ?  Start     Ordered  ? 06/11/21 0753  Diet clear liquid Room service appropriate? Yes; Fluid consistency: Thin  Diet effective now       ?Question Answer Comment  ?Room service appropriate? Yes   ?Fluid consistency: Thin   ?  ? 06/11/21 0754  ? ?  ?  ? ?  ? ? ?DVT prophylaxis: Place and maintain sequential compression device Start: 06/11/21 0902 ?SCDs Start: 06/11/21 0752 ? ? ?Lab Results  ?Component Value Date  ? PLT 229 06/11/2021  ? ? ?  Code Status: Full Code ? ?Family Communication: no family at bedside  ? ?Status is: Observation ? ?The patient will require care spanning > 2 midnights and should be moved to inpatient because: Persistent hypotension, symptomatic ? ?Level of care: Med-Surg ? ?Consultants:  ?None ? ?Procedures:  ?none ? ?Microbiology  ?none ? ?Antimicrobials: ?none  ? ? ?Objective: ?Vitals:  ? 06/11/21 2003 06/12/21 0018 06/12/21 0354 06/12/21 5638  ?BP: (!) 117/58 105/80 (!) 88/48 (!) 88/57  ?Pulse: 79 92 78 75  ?Resp: '18 18 18 18  '$ ?Temp: 98 ?F (36.7 ?C) (!) 97.5 ?F (36.4 ?C) 98.6 ?F (37 ?C) 98.7 ?F (37.1 ?C)  ?TempSrc: Oral Oral Oral Oral  ?SpO2: 100%  99% 98%  ?Weight:      ?Height:      ? ? ?Intake/Output Summary (Last 24 hours) at 06/12/2021 0723 ?Last data filed at 06/12/2021 0600 ?Gross per 24 hour  ?Intake 1960.5 ml  ?Output 0 ml  ?Net 1960.5 ml  ? ?Wt Readings from Last 3 Encounters:  ?06/10/21 62.6 kg  ?08/27/19 65.5 kg  ?07/05/19 63.5 kg  ? ? ?Examination: ? ?Constitutional: NAD ?Eyes: no scleral icterus ?ENMT: Mucous membranes are  moist.  ?Neck: normal, supple ?Respiratory: Bibasilar Velcro type sounds, no wheezing, no crackles. Normal respiratory effort.  ?Cardiovascular: Regular rate and rhythm, no murmurs / rubs / gallops. No LE edema.  ?Abdomen: non distended, no tenderness. Bowel sounds positive.  ?Musculoskeletal: no clubbing / cyanosis.  ?Skin: no rashes ?Neurologic: nonfocal ? ? ?Data Reviewed: I have independently reviewed following labs and imaging studies  ? ?CBC ?Recent Labs  ?Lab 06/10/21 ?1903 06/11/21 ?0840  ?WBC 7.2 7.8  ?HGB 13.6 12.9  ?HCT 42.7 39.5  ?PLT 232 229  ?MCV 100.9* 100.3*  ?MCH 32.2 32.7  ?MCHC 31.9 32.7  ?RDW 15.8* 15.7*  ?LYMPHSABS 1.2  --   ?MONOABS 1.1*  --   ?EOSABS 0.0  --   ?BASOSABS 0.0  --   ? ? ?Recent Labs  ?Lab 06/10/21 ?1903 06/11/21 ?0840  ?NA 139 139  ?K 4.0 3.4*  ?CL 108 112*  ?CO2 20* 17*  ?GLUCOSE 106* 83  ?BUN 12 13  ?CREATININE 0.70 0.73  ?CALCIUM 8.9 8.3*  ?AST 73* 67*  ?ALT 29  24  ?ALKPHOS 60 49  ?BILITOT 0.8 1.0  ?ALBUMIN 3.4* 2.9*  ?PROCALCITON  --  <0.10  ?TSH  --  2.028  ? ? ?------------------------------------------------------------------------------------------------------------------ ?No results for input(s): CHOL, HDL, LDLCALC, TRIG, CHOLHDL, LDLDIRECT in the last 72 hours. ? ?No results found for: HGBA1C ?------------------------------------------------------------------------------------------------------------------ ?Recent Labs  ?  06/11/21 ?0840  ?TSH 2.028  ? ? ?Cardiac Enzymes ?No results for input(s): CKMB, TROPONINI, MYOGLOBIN in the last 168 hours. ? ?Invalid input(s): CK ?------------------------------------------------------------------------------------------------------------------ ?   ?Component Value Date/Time  ? BNP 131.4 (H) 04/23/2018 1044  ? ? ?CBG: ?No results for input(s): GLUCAP in the last 168 hours. ? ?Recent Results (from the past 240 hour(s))  ?Resp Panel by RT-PCR (Flu A&B, Covid) Nasopharyngeal Swab     Status: None  ? Collection Time: 06/10/21  7:03 PM   ? Specimen: Nasopharyngeal Swab; Nasopharyngeal(NP) swabs in vial transport medium  ?Result Value Ref Range Status  ? SARS Coronavirus 2 by RT PCR NEGATIVE NEGATIVE Final  ?  Comment: (NOTE) ?SARS-CoV-2 target nuc

## 2021-06-12 NOTE — Progress Notes (Signed)
Physical Therapy Note ?(Full PT eval note to follow) ? ?Obtained serial BPs as a part of PT eval, and pt had an interesting response;  ? ?Initial Orthostatic BPs did not show a substantial BP drop, but HR did show an increase from 73 bpm in supine to 112 bpm at 3 minutes standing, and reports of feeling weak were associated with standing 3-5 minutes;  ? ?Once seated in recliner, pt's symptoms of feeling weak did not improve; her BP (diastolic especially) rebounded quite a lot between positioning with her feet down to feet up; and then, with incr time in sitting OOB in the recliner, her overall BP drifted downwards to 103/68 -- original supine BP was 122/60; ? ?It seems that the act of standing and upright activity gave a jolt to her system, and sitting upright after that hasn't given her body the opportunity to regulate;  ?Discussed with Juree, NT, who indicated she would check on Ms. Puskarich early in the 5 o clock hour. ? ? ? 06/12/21 1630  ?Orthostatic Lying   ?BP- Lying 122/60 ?(MAP 78)  ?Pulse- Lying 73  ?Orthostatic Sitting  ?BP- Sitting 122/73 ?(MAP 88)  ?Pulse- Sitting 76  ?Orthostatic Standing at 0 minutes  ?BP- Standing at 0 minutes (!) 128/94 ?(MAP 100)  ?Pulse- Standing at 0 minutes 93  ?Orthostatic Standing at 3 minutes  ?BP- Standing at 3 minutes 120/88 ?(MAP 100)  ?Pulse- Standing at 3 minutes 112 ?(report feeling "weak")  ? ? ? ? 06/12/21 1634 06/12/21 1636 06/12/21 1638  ?Orthostatic Sitting  ?BP- Sitting 123/81 ?(MAP 93) (!) 129/101 ?(MAP 110) 117/68 ?(MAP 77)  ?Pulse- Sitting 81 ?(in recliner, feet down; reports still not feelinf right) 81 ?(feet up in recliner) 72 ?(feet up in recliner; still feeling weak, and like "BP is low")  ? ? 06/12/21 1644  ?Orthostatic Sitting  ?BP- Sitting 103/68 ?(MAP 79)  ?Pulse- Sitting 72 ?(still in recliner, feet up; feeling dizzy)  ? ?Roney Marion, PT  ?Acute Rehabilitation Services ?Pager [phone] ?Office [phone] ? ?

## 2021-06-12 NOTE — Evaluation (Addendum)
Physical Therapy Evaluation ?Patient Details ?Name: [name] [name] ?MRN: [id number] ?DOB: 02-Jun-1950 ?Today's Date: 06/12/2021 ? ?History of Present Illness ? 71 year old female, comes into the hospital with lightheadedness, weakness, dizziness progressively worse over the past week.  Symptoms are worse whenever she gets up and starts moving around.  She also been having nausea, vomiting and poor p.o. intake over the last week.  She reports that she saw couple blood clots in her emesis as well as some dried blood clots when blowing her nose.  She also have been having problems with home oxygen and her tanks ran the day before admission; with history of HIV, CAD, pulmonary fibrosis with chronic respiratory failure  ?Clinical Impression ?  ?Pt admitted with above diagnosis. Lives at home with daughter and grandson (daughter works days), in a single-level home with a flight of  steps to enter; Prior to admission, pt was able to manage independently, reports no difficulty with the flight os steps; Presents to PT with generalized weakness, and an interesting BP response to upright activity; See vitals flowsheets or other note of this date;  Pt currently with functional limitations due to the deficits listed below (see PT Problem List). Pt will benefit from skilled PT to increase their independence and safety with mobility to allow discharge to the venue listed below.      ? ?Consider using ted hose next session for some LE compression ?   ? ?Recommendations for follow up therapy are one component of a multi-disciplinary discharge planning process, led by the attending physician.  Recommendations may be updated based on patient status, additional functional criteria and insurance authorization. ? ?Follow Up Recommendations Home health PT ? ?  ?Assistance Recommended at Discharge Intermittent Supervision/Assistance  ?Patient can return home with the following ? A little help with walking and/or transfers;Help with stairs or ramp  for entrance ? ?  ?Equipment Recommendations Rollator (4 wheels);Other (comment);BSC/3in1 (consider shower seat)  ?Recommendations for Other Services ?  (will consider OT consult)  ?  ?Functional Status Assessment Patient has had a recent decline in their functional status and demonstrates the ability to make significant improvements in function in a reasonable and predictable amount of time.  ? ?  ?Precautions / Restrictions Precautions ?Precautions: Fall  ? ?  ? ?Mobility ? Bed Mobility ?Overal bed mobility: Needs Assistance ?Bed Mobility: Supine to Sit ?  ?  ?Supine to sit: Min assist ?  ?  ?General bed mobility comments: Min handheld assist t pull to sit ?  ? ?Transfers ?Overall transfer level: Needs assistance ?Equipment used: Rolling walker (2 wheels) ?Transfers: Sit to/from Stand, Bed to chair/wheelchair/BSC ?Sit to Stand: Min assist ?  ?Step pivot transfers: Min assist ?  ?  ?  ?General transfer comment: Min assist to steady and close guard during pivotal steps bed to recliner ?  ? ?Ambulation/Gait ?  ?  ?  ?  ?  ?  ?  ?General Gait Details: Deferred as pt became symptomatic with stadnign ? ?Stairs ?  ?  ?  ?  ?  ? ?Wheelchair Mobility ?  ? ?Modified Rankin (Stroke Patients Only) ?  ? ?  ? ?Balance Overall balance assessment: Mild deficits observed, not formally tested ?  ?  ?  ?  ?  ?  ?  ?  ?  ?  ?  ?  ?  ?  ?  ?  ?  ?  ?   ? ? ? ?Pertinent Vitals/Pain Pain Assessment ?Pain  Assessment: No/denies pain  ? ? ?Home Living Family/patient expects to be discharged to:: Private residence ?Living Arrangements: Children ?Available Help at Discharge: Family;Available PRN/intermittently (daughter works days) ?Type of Home: Apartment ?Home Access: Stairs to enter ?Entrance Stairs-Rails: Right ?Entrance Stairs-Number of Steps: flight (with landing; 7+7) ?  ?Home Layout: One level ?Home Equipment: Conservation officer, nature (2 wheels);Cane - single point ?   ?  ?Prior Function Prior Level of Function : Independent/Modified  Independent ?  ?  ?  ?  ?  ?  ?Mobility Comments: Reports no difficulty with flight of steps to enter her home prior to onset of feeling bad ?  ?  ? ? ?Hand Dominance  ?   ? ?  ?Extremity/Trunk Assessment  ? Upper Extremity Assessment ?Upper Extremity Assessment: Overall WFL for tasks assessed (for simple tasks) ?  ? ?Lower Extremity Assessment ?Lower Extremity Assessment: Generalized weakness ?  ? ?   ?Communication  ? Communication: No difficulties  ?Cognition Arousal/Alertness: Awake/alert ?Behavior During Therapy: [facility] for tasks assessed/performed ?Overall Cognitive Status: Within Functional Limits for tasks assessed ?  ?  ?  ?  ?  ?  ?  ?  ?  ?  ?  ?  ?  ?  ?  ?  ?General Comments: Showing good self-monitor for activity tolernace ?  ?  ? ?  ?General Comments General comments (skin integrity, edema, etc.): See other note of this date for details related to serial BPs ? ?  ?Exercises    ? ?Assessment/Plan  ?  ?PT Assessment Patient needs continued PT services  ?PT Problem List Decreased strength;Decreased activity tolerance;Decreased balance;Decreased mobility;Decreased knowledge of use of DME;Cardiopulmonary status limiting activity ? ?   ?  ?PT Treatment Interventions DME instruction;Gait training;Stair training;Functional mobility training;Therapeutic exercise;Therapeutic activities;Balance training;Patient/family education   ? ?PT Goals (Current goals can be found in the Care Plan section)  ?Acute Rehab PT Goals ?Patient Stated Goal: Be able to manage the stairs to get into her hoem so she can walk her dog; back to normal ?PT Goal Formulation: With patient ?Time For Goal Achievement: 06/26/21 ?Potential to Achieve Goals: Good ? ?  ?Frequency Min 3X/week ?  ? ? ?Co-evaluation   ?  ?  ?  ?  ? ? ?  ?AM-PAC PT "6 Clicks" Mobility  ?Outcome Measure Help needed turning from your back to your side while in a flat bed without using bedrails?: None ?Help needed moving from lying on your back to sitting on the side of a  flat bed without using bedrails?: None ?Help needed moving to and from a bed to a chair (including a wheelchair)?: A Little ?Help needed standing up from a chair using your arms (e.g., wheelchair or bedside chair)?: A Little ?Help needed to walk in hospital room?: A Lot ?Help needed climbing 3-5 steps with a railing? : Total ?6 Click Score: 17 ? ?  ?End of Session Equipment Utilized During Treatment: Gait belt (vitals machine) ?Activity Tolerance: Other (comment) (limited by feeling symptomatic for weakness/dizziness) ?Patient left: in chair;with call bell/phone within reach (REclined and feet up) ?Nurse Communication: Mobility status (BPs) ?PT Visit Diagnosis: Unsteadiness on feet (R26.81);Other abnormalities of gait and mobility (R26.89) ?  ? ?Time: 8502-7741 ?PT Time Calculation (min) (ACUTE ONLY): 38 min ? ? ?Charges:   PT Evaluation ?$PT Eval Moderate Complexity: 1 Mod ?PT Treatments ?$Therapeutic Activity: 23-37 mins ?  ?   ? ? ?Roney Marion, PT  ?Acute Rehabilitation Services ?Pager [phone] ?Office [phone] ? ? ?  [name] ?06/12/2021, 6:20 PM ? ?

## 2021-06-13 ENCOUNTER — Inpatient Hospital Stay (HOSPITAL_COMMUNITY): Payer: Medicare Other

## 2021-06-13 DIAGNOSIS — J9611 Chronic respiratory failure with hypoxia: Secondary | ICD-10-CM | POA: Diagnosis not present

## 2021-06-13 DIAGNOSIS — I951 Orthostatic hypotension: Secondary | ICD-10-CM | POA: Diagnosis not present

## 2021-06-13 DIAGNOSIS — R109 Unspecified abdominal pain: Secondary | ICD-10-CM | POA: Diagnosis not present

## 2021-06-13 DIAGNOSIS — R9431 Abnormal electrocardiogram [ECG] [EKG]: Secondary | ICD-10-CM

## 2021-06-13 DIAGNOSIS — I25118 Atherosclerotic heart disease of native coronary artery with other forms of angina pectoris: Secondary | ICD-10-CM | POA: Diagnosis not present

## 2021-06-13 LAB — CBC
HCT: 36.8 % (ref 36.0–46.0)
Hemoglobin: 12 g/dL (ref 12.0–15.0)
MCH: 32.8 pg (ref 26.0–34.0)
MCHC: 32.6 g/dL (ref 30.0–36.0)
MCV: 100.5 fL — ABNORMAL HIGH (ref 80.0–100.0)
Platelets: 196 10*3/uL (ref 150–400)
RBC: 3.66 MIL/uL — ABNORMAL LOW (ref 3.87–5.11)
RDW: 15.9 % — ABNORMAL HIGH (ref 11.5–15.5)
WBC: 6.8 10*3/uL (ref 4.0–10.5)
nRBC: 0 % (ref 0.0–0.2)

## 2021-06-13 LAB — COMPREHENSIVE METABOLIC PANEL
ALT: 25 U/L (ref 0–44)
AST: 56 U/L — ABNORMAL HIGH (ref 15–41)
Albumin: 2.8 g/dL — ABNORMAL LOW (ref 3.5–5.0)
Alkaline Phosphatase: 50 U/L (ref 38–126)
Anion gap: 6 (ref 5–15)
BUN: <5 mg/dL — ABNORMAL LOW (ref 8–23)
CO2: 22 mmol/L (ref 22–32)
Calcium: 8.4 mg/dL — ABNORMAL LOW (ref 8.9–10.3)
Chloride: 113 mmol/L — ABNORMAL HIGH (ref 98–111)
Creatinine, Ser: 0.66 mg/dL (ref 0.44–1.00)
GFR, Estimated: >60 mL/min (ref >60)
Glucose, Bld: 88 mg/dL (ref 70–99)
Potassium: 3.6 mmol/L (ref 3.5–5.1)
Sodium: 141 mmol/L (ref 135–145)
Total Bilirubin: 0.7 mg/dL (ref 0.3–1.2)
Total Protein: 5.4 g/dL — ABNORMAL LOW (ref 6.5–8.1)

## 2021-06-13 LAB — ECHOCARDIOGRAM COMPLETE
Area-P 1/2: 5.66 cm2
Height: 59 in
S' Lateral: 2.7 cm
Weight: 2395.08 oz

## 2021-06-13 LAB — TROPONIN I (HIGH SENSITIVITY)
Troponin I (High Sensitivity): 4 ng/L (ref <18)
Troponin I (High Sensitivity): 3 ng/L (ref <18)

## 2021-06-13 LAB — ACTH STIMULATION, 3 TIME POINTS
Cortisol, 30 Min: 21.6 ug/dL
Cortisol, 60 Min: 23.5 ug/dL
Cortisol, Base: 10.4 ug/dL

## 2021-06-13 MED ORDER — MORPHINE SULFATE (PF) 2 MG/ML IV SOLN
0.2500 mg | Freq: Once | INTRAVENOUS | Status: AC
  Administered 2021-06-13: 0.25 mg via INTRAVENOUS
  Filled 2021-06-13: qty 1

## 2021-06-13 MED ORDER — SODIUM CHLORIDE 0.9 % IV SOLN
1.0000 g | INTRAVENOUS | Status: DC
  Administered 2021-06-13 – 2021-06-14 (×2): 1 g via INTRAVENOUS
  Filled 2021-06-13 (×2): qty 10

## 2021-06-13 MED ORDER — NITROGLYCERIN 0.4 MG SL SUBL
SUBLINGUAL_TABLET | SUBLINGUAL | Status: AC
  Filled 2021-06-13: qty 1

## 2021-06-13 MED ORDER — ASPIRIN 81 MG PO CHEW
81.0000 mg | CHEWABLE_TABLET | Freq: Every day | ORAL | Status: DC
Start: 2021-06-13 — End: 2021-06-17
  Administered 2021-06-13 – 2021-06-17 (×5): 81 mg via ORAL
  Filled 2021-06-13 (×5): qty 1

## 2021-06-13 MED ORDER — CYANOCOBALAMIN 1000 MCG/ML IJ SOLN
1000.0000 ug | Freq: Every day | INTRAMUSCULAR | Status: AC
Start: 2021-06-13 — End: 2021-06-16
  Administered 2021-06-13 – 2021-06-16 (×4): 1000 ug via INTRAMUSCULAR
  Filled 2021-06-13 (×4): qty 1

## 2021-06-13 NOTE — Progress Notes (Signed)
?   06/12/21 2150  ?Assess: MEWS Score  ?Temp 98.2 ?F (36.8 ?C)  ?BP (!) 118/54  ?Pulse Rate 86  ?Resp (!) 26  ?Level of Consciousness Alert  ?SpO2 100 %  ?O2 Device Nasal Cannula  ?Patient Activity (if Appropriate) In bed  ?O2 Flow Rate (L/min) 2 L/min  ?Assess: MEWS Score  ?MEWS Temp 0  ?MEWS Systolic 0  ?MEWS Pulse 0  ?MEWS RR 2  ?MEWS LOC 0  ?MEWS Score 2  ?MEWS Score Color Yellow  ?Assess: if the MEWS score is Yellow or Red  ?Were vital signs taken at a resting state? Yes  ?Focused Assessment Change from prior assessment (see assessment flowsheet)  ?Early Detection of Sepsis Score *See Row Information* Low  ?MEWS guidelines implemented *See Row Information* Yes  ?Treat  ?MEWS Interventions Escalated (See documentation below)  ?Pain Scale 0-10  ?Pain Score 9  ?Pain Type Acute pain  ?Pain Location Chest  ?Pain Orientation Left  ?Pain Radiating Towards Shoulder, Neck, Back  ?Pain Descriptors / Indicators Sharp  ?Pain Frequency Constant  ?Pain Onset On-going  ?Patients Stated Pain Goal 0  ?Pain Intervention(s) MD notified (Comment);Emotional support  ?Take Vital Signs  ?Increase Vital Sign Frequency  Yellow: Q 2hr X 2 then Q 4hr X 2, if remains yellow, continue Q 4hrs  ?Escalate  ?MEWS: Escalate Yellow: discuss with charge nurse/RN and consider discussing with provider and RRT  ?Notify: Charge Nurse/RN  ?Name of Charge Nurse/RN Notified Rockie Neighbours MD  ?Date Charge Nurse/RN Notified 06/12/21  ?Notify: Provider  ?Provider Name/Title Dr. Hal Hope  ?Date Provider Notified 06/12/21  ?Notification Type Page  ?Notification Reason Change in status  ?Provider response See new orders  ?Date of Provider Response 06/12/21  ?Document  ?Patient Outcome Stabilized after interventions  ?Progress note created (see row info) Yes  ? ?Patient complained of sudden onset left sided chest pain with radiation to left shoulder and neck.  Pain is sharp in nature and radiates to the back.  Patient's RR is 26 which put her in Yellow  MEWS.  Dr. Hal Hope made aware.  CN and RR Nurse made aware.  Multiple orders received and implemented.  Yellow MEWS Protocol initiated.  Patient states this reminds her of when she had her heart attack 2 years ago and had stent placed at [facility].  Patient currently stable.  Placed on Telemetry.  Will continue to monitor patient.  Earleen Reaper RN ?

## 2021-06-13 NOTE — Consult Note (Addendum)
?Cardiology Consultation:  ? ?Patient ID: Lyn Henri ?MRN: [id number]; DOB: 13-Mar-1951 ? ?Admit date: 06/10/2021 ?Date of Consult: 06/13/2021 ? ?PCP:  [facility] ?  ?[facility] Providers ?Cardiologist:  [facility] { ? ?Patient Profile:  ? ?Brocha KIKI BIVENS is a 71 y.o. adult with a history of CAD s/p DES to mid LAD in 12/2017, hypertension, hyperlipidemia, pulmonary fibrosis, asthma,  GERD with esophageal stricture, and HIV who is being seen for the evaluation of chest pain at the request of Dr. Cruzita Lederer. ? ?History of Present Illness:  ? ?Ms. Lamere is is a 71 year old female with the above history who is followed by cardiology at [facility].  She has a history of CAD with prior DES to mid LAD in 12/2017.  She was then admitted with unstable angina in 04/2020 at [facility].  Echo at that time showed LVEF of 60% with no wall motion abnormalities.  She underwent left heart catheterization which showed patent LAD with nonobstructive diseas of the 2nd Diag and thrombus in the distal branches of the OM without underlying plaque.  Heparin and Aggrastat were administered with resolution of the thrombus at the bifurcation site; however, thrombus did embolize and occlude the terminal portion of the medial branch and this was unable to be opened with a wire.  She was discharged on DAPT with aspirin and Plavix for 1 year.  Patient had ongoing chest tightness following this and was started on Ranexa.  She has been unable to tolerate Imdur secondary to headaches.  She was last seen by Cardiology on 05/16/2021 at which time she reported ongoing dyspnea with minimal exertion as well as chronic intermittent short episodes of chest pain unrelated to exertion.  Plavix was stopped and no other cardiac evaluation was felt to be needed.  Dyspnea was felt to be largely due to underlying pulmonary fibrosis.  She was reportedly told that the best treatment option is a lung transplant but she is not a  candidate because she does not have any caregivers. ? ?Patient presented to the ED via EMS on 06/10/2021 for further evaluation of generalized weakness in setting of poor PO intake and nausea over the last week.  O2 sat was 85% on room air on arrival of EMS and she was placed on 2 L of nasal cannula. Upon arrival to the ED, vitals stable. EKG showed normal sinus rhythm, rate 82 bpm, with mild ST depression in leads V1-V3 but these have been seen on prior tracings. High-sensitivity troponin negative x2. Chest x-ray showed no acute findings. WBC 7.2, Hgb 13.6, Plts 232. Na 139, K 4.0, Glucose 106, BUN 12, Cr 0.70. Albumin 3.4, AST 73, ALT 29, Alk Phos 60, Total Bili 0.8. Respiratory panle negative for COVID and influenza. Patient was noted to be orthostatic in the ED and was given 1L of IV fluids but she remained orthostatic so she was admitted for further management. ? ?Cardiology consulted for further evaluation of chest pain. At the time of this evaluation, patient resting comfortably eating dinner. Patient states she came to the ED for generalized weakness and near syncope. She has had nausea with a couple episodes of vomiting over the last week and has had very poor PO intake. She describes dizziness with standing recently and one episode of near syncope while walking. Otherwise from a cardiac standpoint it sounds like she has been stable.  She has chronic intermittent chest pain that occurs at rest.  She states she did  have very slight chest pain that occurred during near syncopal episode prior to admission but nothing outside the usual for her.  She had another episode of chest pain last night that started under her left breast and radiated to mid sternum and up to her chest.  She described this as a sharp pain that was a 9/10 on the pain scale.  It resolved with morphine.  She states this is similar to the usual chest pain she has. She also has chronic dyspnea both at rest and with exertion due to her pulmonary  fibrosis but this is stable.  She has stable 2-3 pillow orthopnea but no PND or significant edema.  She denies any palpitations.  She had one questionable episode of hematemesis but sounds like it may have been due to epistaxis.  No abnormal bleeding in urine or stools. ? ?She remains symptomatic with dizziness when sitting upright or ambulating.  Her nausea seems to be improving but she did have a little better earlier today.  She is currently chest pain-free. ? ?She has a remote smoking history (quit about 30 years ago). She does have a family history of heart disease. Her mother had heart disease (unclear exactly what) as well as a stroke and one of her sisters has known CAD with prior stenting. She also has a strong family history of lung disease - her father died from emphysema, one of her brother died in 03/21/21 from [place] in setting of underlying end stage lung disease (awaiting transplant), and another brother has had a bilateral lung transplant. ? ? ?Past Medical History:  ?Diagnosis Date  ? Asthma   ? Bradycardia   ? Sinus bradycardia  ? CAD (coronary artery disease)   ? Minimal by history, Question coronary artery spasm  ? Colon polyps   ? Dyslipidemia   ? GERD (gastroesophageal reflux disease)   ? Esophageal stricture  ? H/O: hysterectomy   ? Hiatal hernia   ? HIV positive ([place])   ? Needle stick 1993  ? Hypertension   ? Palpitations   ? Peptic ulcer disease   ? Pulmonary fibrosis ([place])   ? on 2L home O2, prn at night  ? Shortness of breath   ? Extensive evaluation Dr. Winona Legato, [facility], May, 2725, etiology uncertain, probable bronchiolitis possibility of lung biopsy at that time  ? Weight loss   ? ? ?Past Surgical History:  ?Procedure Laterality Date  ? ABDOMINAL HYSTERECTOMY    ? APPENDECTOMY    ? CARDIAC CATHETERIZATION N/A 03/21/2016  ? Procedure: Right/Left Heart Cath and Coronary Angiography;  Surgeon: Belva Crome, MD;  Location: [place] [facility];  Service: Cardiovascular;  Laterality: N/A;  ?  CHOLECYSTECTOMY    ? CORONARY STENT PLACEMENT  12/2017  ? GASTRECTOMY    ? VENTRAL HERNIA REPAIR    ?  ? ?Home Medications:  ?Prior to Admission medications   ?Medication Sig Start Date End Date Taking? Authorizing Provider  ?albuterol (VENTOLIN HFA) 108 (90 Base) MCG/ACT inhaler Inhale 1-2 puffs into the lungs every 6 (six) hours as needed for wheezing or shortness of breath.   Yes [provider]  ?aspirin EC 81 MG tablet Take 1 tablet (81 mg total) by mouth daily. 03/17/16  Yes Dorothy Spark, MD  ?atorvastatin (LIPITOR) 80 MG tablet Take 80 mg by mouth daily at 6 PM.   Yes [provider]  ?budesonide-formoterol (SYMBICORT) 160-4.5 MCG/ACT inhaler Inhale 2 puffs into the lungs 2 (two) times daily.  Yes [provider]  ?clopidogrel (PLAVIX) 75 MG tablet Take 75 mg by mouth daily.   Yes [provider]  ?conjugated estrogens (PREMARIN) vaginal cream Place 1.47 Applicatorfuls vaginally every Wednesday. Pt only to use 1/4 of the applicator    Yes [provider]  ?diclofenac Sodium (VOLTAREN) 1 % GEL Apply 2-4 g topically 2 (two) times daily as needed (joint pain). 05/18/21  Yes [provider]  ?dolutegravir-lamiVUDine (DOVATO) 50-300 MG tablet Take 1 tablet by mouth daily.   Yes [provider]  ?furosemide (LASIX) 20 MG tablet Take 20 mg by mouth daily as needed for fluid. 06/01/21  Yes [provider]  ?lidocaine (XYLOCAINE) 2 % solution Use as directed 15 mLs in the mouth or throat every 6 (six) hours as needed (sore throat). 08/28/19  Yes Cardama, Grayce Sessions, MD  ?losartan (COZAAR) 25 MG tablet Take 12.5 mg by mouth daily. 04/08/21  Yes [provider]  ?metoprolol succinate (TOPROL-XL) 25 MG 24 hr tablet Take 12.5 mg by mouth daily. 05/05/21  Yes [provider]  ?nitroGLYCERIN (NITROSTAT) 0.4 MG SL tablet Place 1 tablet (0.4 mg total) under the tongue every 5 (five) minutes as needed for chest pain. 03/21/16  Yes Belva Crome, MD  ?omeprazole (PRILOSEC) 40 MG capsule Take 1 capsule (40 mg total) by mouth 2 (two) times daily. 07/07/19 06/11/21 Yes Kayleen Memos, DO  ?pantoprazole (PROTONIX) 40 MG tablet Take 40 mg by mouth da

## 2021-06-13 NOTE — Progress Notes (Signed)
?  Echocardiogram ?2D Echocardiogram has been performed. ? ?[name] [name] ?06/13/2021, 10:46 AM ?

## 2021-06-13 NOTE — Progress Notes (Signed)
?PROGRESS NOTE ? ?[name] [name] VXB:[id number] DOB: May 17, 1950 DOA: 06/10/2021 ?PCP: [facility] ? ? LOS: 1 day  ? ?Brief Narrative / Interim history: ?71 year old female with history of HIV, CAD, pulmonary fibrosis with chronic respiratory failure, comes into the hospital with lightheadedness, weakness, dizziness progressively worse over the past week.  Symptoms are worse whenever she gets up and starts moving around.  She also been having nausea, vomiting and poor p.o. intake over the last week.  She reports that she saw couple blood clots in her emesis as well as some dried blood clots when blowing her nose.  She also have been having problems with home oxygen and her tanks ran out yesterday. ? ?Subjective / 24h Interval events: ?She is feeling quite weak this morning, had a hypotensive episode earlier this morning.  Denies any chest pain, no nausea at this morning. ? ?Assesement and Plan: ?Principal Problem: ?  Orthostatic hypotension near syncope ?Active Problems: ?  Near syncope ?  Nausea & vomiting abdominal pain ?  Chronic respiratory failure with hypoxia (HCC) pulmonary fibrosis asthma ?  CAD (coronary artery disease) ?  E. coli UTI ?  GERD (gastroesophageal reflux disease) ?  IPF (idiopathic pulmonary fibrosis) ([place]) ?  HIV (human immunodeficiency virus infection) ([place]) ?  Abdominal pain ?  Elevated AST (SGOT) ?  Vitamin B12 deficiency ?  Macrocytic anemia ?  Hypotension ? ? ?Assessment and Plan: ?Principal problem ?Orthostatic hypotension near syncope -Patient presents with complaints of weakness and reports of near syncope.  She denies passing out/loss of consciousness.  She remains quite hypertensive this morning, symptomatic, bolus half a liter and continue to monitor.  Home blood pressure regimen includes Lasix, Toprol, Ranexa.  TSH is unremarkable, since she has been on and off steroids we will check a stim test for adrenal insufficiency ? ?Active problems ?CAD (coronary artery  disease) -Patient had complained of some chest pain on admission, but pointed epigastrically.  EKG without significant ischemic changes, troponin negative.  Prior history of PCI to the mid LAD in 12/2017 and left circumflex thrombolysis and 04/2020.  ?-Has been having recurrent chest pain overnight and into this morning, on the left side of her chest going into her left shoulder and left jaw, identical to her prior MIs.  Still has chest pain this morning.  Cardiology consulted, appreciate input ? ?B12 deficiency-B12 levels low.  Supplement IM daily for 5 doses.  She reports numbness in her lower extremity as well as poor balance, suspect this contributes but does not explain her hypotension ? ?Chronic respiratory failure with hypoxia ([place]) pulmonary fibrosis asthma -she is complaining of shortness of breath but feels somewhat at baseline.  She was hypoxic on room air but stable on 2-3 L at this time.  Continue to monitor.  Continue breathing treatments ? ?Questionable hematemesis-without recurrence.  Resume aspirin given chest pain.  Plavix per cardiology ? ?Nausea & vomiting abdominal pain -check cortisol as above.  CT scan of the abdomen and pelvis fairly unremarkable.  Continue clear liquid diet, advance as tolerated ? ?E. coli UTI -Prior to arrival.  Patient had been seen by Dr. Aundra Dubin of urology last week and culture reportedly had grown out E. coli although urinalysis did not show significant signs of infection.  She was recommended start on Macrobid and took 1-2 doses prior to discontinuing due to possible interactions with her other medicines.  Urine culture with E. coli, possibly contributing to her weakness.  Start ceftriaxone ? ?Elevated  AST (SGOT)-mild, monitor.  Unclear significance.  Not a drinker ? ?HIV (human immunodeficiency virus infection) (HCC)-Continue Dovato ? ?GERD (gastroesophageal reflux disease) -Patient with prior history of dysphagia with schatizi ring status post dilation  home  medication regimen includes Protonix 40 mg daily and reportedly states she was told not to take the omeprazole. ? ?Hyperlipidemia-continue statin ? ?Scheduled Meds: ? arformoterol  15 mcg Nebulization BID  ? aspirin  81 mg Oral Daily  ? atorvastatin  80 mg Oral q1800  ? budesonide (PULMICORT) nebulizer solution  0.5 mg Nebulization BID  ? dolutegravir  50 mg Oral Daily  ? And  ? lamiVUDine  300 mg Oral Daily  ? nitroGLYCERIN      ? pantoprazole  40 mg Oral BID  ? sodium chloride flush  3 mL Intravenous Q12H  ? traZODone  50 mg Oral QHS  ? ?Continuous Infusions: ? lactated ringers 75 mL/hr at 06/12/21 2242  ? ?PRN Meds:.albuterol, diclofenac Sodium, ondansetron **OR** ondansetron (ZOFRAN) IV, polyethylene glycol ? ?Diet Orders (From admission, onward)  ? ?  Start     Ordered  ? 06/11/21 0753  Diet clear liquid Room service appropriate? Yes; Fluid consistency: Thin  Diet effective now       ?Question Answer Comment  ?Room service appropriate? Yes   ?Fluid consistency: Thin   ?  ? 06/11/21 0754  ? ?  ?  ? ?  ? ? ?DVT prophylaxis: Place and maintain sequential compression device Start: 06/11/21 0902 ?SCDs Start: 06/11/21 0752 ? ? ?Lab Results  ?Component Value Date  ? PLT 196 06/13/2021  ? ? ?  Code Status: Full Code ? ?Family Communication: no family at bedside  ? ?Status is: Inpatient ? ?Inpatient: Active chest pain, persistent weakness ? ?Level of care: Med-Surg ? ?Consultants:  ?None ? ?Procedures:  ?none ? ?Microbiology  ?none ? ?Antimicrobials: ?none  ? ? ?Objective: ?Vitals:  ? 06/13/21 0049 06/13/21 0120 06/13/21 0502 06/13/21 0906  ?BP: 133/63 (!) 114/54 118/73 116/71  ?Pulse: 76 78 75 81  ?Resp: (!) '22 20 20 17  '$ ?Temp: 98.2 ?F (36.8 ?C) 98.1 ?F (36.7 ?C) 98.1 ?F (36.7 ?C) 97.8 ?F (36.6 ?C)  ?TempSrc: Oral  Oral Oral  ?SpO2: 100%  100% 100%  ?Weight:      ?Height:      ? ? ?Intake/Output Summary (Last 24 hours) at 06/13/2021 1001 ?Last data filed at 06/13/2021 0800 ?Gross per 24 hour  ?Intake 2520.62 ml  ?Output  2950 ml  ?Net -429.38 ml  ? ? ?Wt Readings from Last 3 Encounters:  ?06/12/21 67.9 kg  ?08/27/19 65.5 kg  ?07/05/19 63.5 kg  ? ? ?Examination: ? ?Constitutional: NAD ?Eyes: lids and conjunctivae normal, no scleral icterus ?ENMT: mmm ?Neck: normal, supple ?Respiratory: No wheezing, no crackles.  ?Cardiovascular: Regular rate and rhythm, no murmurs / rubs / gallops. No LE edema. ?Abdomen: soft, no distention, no tenderness. Bowel sounds positive.  ?Skin: no rashes ?Neurologic: no focal deficits, equal strength ? ? ?Data Reviewed: I have independently reviewed following labs and imaging studies  ? ?CBC ?Recent Labs  ?Lab 06/10/21 ?1903 06/11/21 ?0840 06/12/21 ?3335 06/13/21 ?0040  ?WBC 7.2 7.8 6.4 6.8  ?HGB 13.6 12.9 11.0* 12.0  ?HCT 42.7 39.5 33.1* 36.8  ?PLT 232 229 169 196  ?MCV 100.9* 100.3* 100.3* 100.5*  ?MCH 32.2 32.7 33.3 32.8  ?MCHC 31.9 32.7 33.2 32.6  ?RDW 15.8* 15.7* 15.8* 15.9*  ?LYMPHSABS 1.2  --  1.4  --   ?MONOABS  1.1*  --  1.0  --   ?EOSABS 0.0  --  0.1  --   ?BASOSABS 0.0  --  0.0  --   ? ? ? ?Recent Labs  ?Lab 06/10/21 ?1903 06/11/21 ?0840 06/12/21 ?9150 06/12/21 ?5697 06/13/21 ?0040  ?NA 139 139 143  --  141  ?K 4.0 3.4* 3.5  --  3.6  ?CL 108 112* 116*  --  113*  ?CO2 20* 17* 20*  --  22  ?GLUCOSE 106* 83 84  --  88  ?BUN 12 13 6*  --  <5*  ?CREATININE 0.70 0.73 0.64  --  0.66  ?CALCIUM 8.9 8.3* 8.0*  --  8.4*  ?AST 73* 67* 56*  --  56*  ?ALT '29 24 23  '$ --  25  ?ALKPHOS 60 49 44  --  50  ?BILITOT 0.8 1.0 0.9  --  0.7  ?ALBUMIN 3.4* 2.9* 2.5*  --  2.8*  ?PROCALCITON  --  <0.10  --   --   --   ?LATICACIDVEN  --   --  0.9 1.8  --   ?TSH  --  2.028  --   --   --   ? ? ? ?------------------------------------------------------------------------------------------------------------------ ?No results for input(s): CHOL, HDL, LDLCALC, TRIG, CHOLHDL, LDLDIRECT in the last 72 hours. ? ?No results found for:  HGBA1C ?------------------------------------------------------------------------------------------------------------------ ?Recent Labs  ?  06/11/21 ?0840  ?TSH 2.028  ? ? ? ?Cardiac Enzymes ?No results for input(s): CKMB, TROPONINI, MYOGLOBIN in the last 168 hours. ? ?Invalid input(s): CK ?----------------------------

## 2021-06-13 NOTE — TOC Initial Note (Signed)
Transition of Care (TOC) - Initial/Assessment Note  ? ? ?Patient Details  ?Name: [name] [name] ?MRN: [id number] ?Date of Birth: May 28, 1950 ? ?Transition of Care (TOC) CM/SW Contact:    ?Tom-Johnson, Renea Ee, RN ?Phone Number: ?06/13/2021, 3:40 PM ? ?Clinical Narrative:                 ? ?CM spoke with patient about needs for post hospital transition. Admitted for Orthostatic Hypotension-near Syncope. From home with daughter and grandson. Does not drive. Schedules [employer] transportation to and from appointments. Has a cane and a walker at home. Uses home Oxygen but could not remember name of company that supplies. CM called [employer] and [employer] to verify but patient is not on their system. Awaiting response from [place] with [employer].  ?Home health PT/OT referral called in to [facility] per patient's request and Marjory Lies voiced acceptance. Rollator and BSC ordered from [employer] and Freda Munro to deliver at bedside.  ?Cannot remember name of PCP. [facility] [address]  ?CM will continue to follow with needs. ? ?Expected Discharge Plan: [place] ?Barriers to Discharge: Continued Medical Work up ? ? ?Patient Goals and CMS Choice ?Patient states their goals for this hospitalization and ongoing recovery are:: To return home ?CMS Medicare.gov Compare Post Acute Care list provided to:: Patient ?Choice offered to / list presented to : Patient ? ?Expected Discharge Plan and Services ?Expected Discharge Plan: [place] ?  ?Discharge Planning Services: CM Consult ?Post Acute Care Choice: Home Health ?Living arrangements for the past 2 months: [place] ?                ?DME Arranged: 3-N-1, Walker rolling with seat ?DME Agency: [facility] ?Date DME Agency Contacted: 06/13/21 ?Time DME Agency Contacted: [phone] ?Representative spoke with at DME Agency: Freda Munro ?HH Arranged: PT, OT ?[place] Agency: [place] ?  ?Time [place]: 5809 ?Representative spoke with at [place]: Marjory Lies ? ?Prior Living Arrangements/Services ?Living arrangements for the past 2 months: [place] ?Lives with:: Adult Children (Daughte and grand son) ?Patient language and need for interpreter reviewed:: Yes ?Do you feel safe going back to the place where you live?: Yes      ?Need for Family Participation in Patient Care: Yes (Comment) ?Care giver support system in place?: Yes (comment) ?Current home services: DME (Cane, walker) ?Criminal Activity/Legal Involvement Pertinent to Current Situation/Hospitalization: No - Comment as needed ? ?Activities of Daily Living ?Home Assistive Devices/Equipment: [place] Rile (specify type) ?ADL Screening (condition at time of admission) ?Patient's cognitive ability adequate to safely complete daily activities?: Yes ?Is the patient deaf or have difficulty hearing?: Yes ?Does the patient have difficulty seeing, even when wearing glasses/contacts?: Yes ?Does the patient have difficulty concentrating, remembering, or making decisions?: Yes ?Patient able to express need for assistance with ADLs?: Yes ?Does the patient have difficulty dressing or bathing?: No ?Independently performs ADLs?: Yes (appropriate for developmental age) ?Does the patient have difficulty walking or climbing stairs?: No ?Weakness of Legs: Both ?Weakness of Arms/Hands: None ? ?Permission Sought/Granted ?Permission sought to share information with : Case Manager, Customer service manager, Family Supports ?Permission granted to share information with : Yes, Verbal Permission Granted ? Share Information with NAME: Marjory Lies ? Permission granted to share info w AGENCY: [facility] ?   ?   ? ?Emotional Assessment ?Appearance:: Appears stated age ?Attitude/Demeanor/Rapport: Engaged, Gracious ?Affect (typically observed): Accepting, Appropriate, Calm, Hopeful ?Orientation: : Oriented to Self, Oriented to  Place, Oriented to  Time, Oriented to Situation ?Alcohol / Substance Use: Not Applicable ?Psych  Involvement: No (comment) ? ?Admission diagnosis:  Orthostatic hypotension [I95.1] ?Weakness [R53.1] ?Orthostasis [I95.1] ?Hypotension [I95.9] ?Patient Active Problem List  ? Diagnosis Date Noted  ? Vitamin B12 deficiency 06/12/2021  ? Macrocytic anemia 06/12/2021  ? Hypotension 06/12/2021  ? Orthostatic hypotension near syncope 06/11/2021  ? Elevated AST (SGOT) 06/11/2021  ? Chronic respiratory failure with hypoxia ([place]) pulmonary fibrosis asthma 06/11/2021  ? Near syncope 06/11/2021  ? Intractable nausea and vomiting 07/05/2019  ? Nausea & vomiting abdominal pain 09/04/2018  ? E. coli UTI 09/04/2018  ? Nausea and/or vomiting 09/04/2018  ? Influenza due to identified novel influenza A virus with other respiratory manifestations 04/22/2018  ? Hypokalemia 04/22/2018  ? HIV (human immunodeficiency virus infection) ([place]) 04/21/2018  ? Sepsis ([place]) 04/21/2018  ? Abdominal pain 04/21/2018  ? Asthma exacerbation 04/21/2018  ? Anxiety state 03/17/2016  ? Benign neoplasm of colon 03/17/2016  ? Human immunodeficiency virus (HIV) disease ([place]) 03/17/2016  ? Nontoxic uninodular goiter 03/17/2016  ? Other premature beats 03/17/2016  ? Rectocele 03/17/2016  ? Symptomatic bradycardia 03/17/2016  ? Atypical chest pain 03/17/2016  ? Lower extremity edema 10/13/2015  ? Localized acrodermatitis continua of [place] 06/29/2015  ? Cervical pain (neck) 06/14/2015  ? Dysphagia 12/14/2014  ? IPF (idiopathic pulmonary fibrosis) ([place]) 07/06/2014  ? Paronychia 06/16/2014  ? Bone pain 08/25/2013  ? Lumbar radicular pain 08/25/2013  ? Allergic rhinitis 07/14/2013  ? Post-operative state 03/25/2013  ? Preop cardiovascular exam 12/24/2012  ? Ejection fraction   ? Cystocele 12/11/2012  ? Insomnia 10/23/2012  ? Vaginal prolapse 04/02/2012  ? Urinary incontinence 02/28/2012  ? Other benign neoplasm of connective and other soft tissue of lower limb, including hip 09/08/2011  ? Osteoporosis 03/20/2011  ? Closed fracture of sacrum ([place])  12/23/2010  ? HIV positive ([place])   ? CAD (coronary artery disease)   ? Palpitations   ? Dyslipidemia   ? Persistent asthma   ? Peptic ulcer disease   ? GERD (gastroesophageal reflux disease)   ? Bradycardia   ? Shortness of breath   ? CONSTIPATION 07/22/2007  ? WEIGHT LOSS-ABNORMAL 07/22/2007  ? ABDOMINAL PAIN-GENERALIZED 07/22/2007  ? ?PCP:  [place] ?Pharmacy:   ?[id number] [place] [place] [address] ?[id number]?Phone: [phone]: [phone]? ?[place] ?[place]?Phone: [phone]: [phone]? ?[facility] ?[address]?Phone: [phone]: [phone] ? ? ? ? ?Social Determinants of Health (SDOH) Interventions ?  ? ?Readmission Risk Interventions ?   ? View : No data to display.  ?  ?  ?  ? ? ? ?

## 2021-06-14 LAB — URINE CULTURE: Culture: >=100000 — AB

## 2021-06-14 LAB — LEGIONELLA PNEUMOPHILA SEROGP 1 UR AG: L. pneumophila Serogp 1 Ur Ag: NEGATIVE

## 2021-06-14 MED ORDER — RANOLAZINE ER 500 MG PO TB12
500.0000 mg | ORAL_TABLET | Freq: Two times a day (BID) | ORAL | Status: DC
  Administered 2021-06-14 – 2021-06-17 (×7): 500 mg via ORAL
  Filled 2021-06-14 (×7): qty 1

## 2021-06-14 MED ORDER — CLOPIDOGREL BISULFATE 75 MG PO TABS
75.0000 mg | ORAL_TABLET | Freq: Every day | ORAL | Status: DC
  Administered 2021-06-14 – 2021-06-17 (×4): 75 mg via ORAL
  Filled 2021-06-14 (×4): qty 1

## 2021-06-14 MED ORDER — AMOXICILLIN-POT CLAVULANATE 875-125 MG PO TABS
1.0000 | ORAL_TABLET | Freq: Two times a day (BID) | ORAL | Status: DC
  Administered 2021-06-15 – 2021-06-17 (×5): 1 via ORAL
  Filled 2021-06-14 (×5): qty 1

## 2021-06-14 NOTE — Progress Notes (Signed)
Orthopedic Tech Progress Note ?Patient Details:  ?Danija Fransico Setters ?[birth year] ?[id number] ? ?THERAPY called requesting an ABDOMINAL BINDER  ? ?Ortho Devices ?Type of Ortho Device: Abdominal binder ?Ortho Device/Splint Location: STOMACH ?Ortho Device/Splint Interventions: Ordered, Application, Adjustment ?  ?Post Interventions ?Patient Tolerated: Well ?Instructions Provided: Care of device ? ?Janit Pagan ?06/14/2021, 1:07 PM ? ?

## 2021-06-14 NOTE — Progress Notes (Signed)
? ?Progress Note ? ?Patient Name: [name] [name] ?Date of Encounter: 06/14/2021 ? ?Primary Cardiologist: None  ? ?Subjective  ? ?Patient seen and examined at her bedside. She was lying in bed awake when I arrived. She reports that she feels weak. ? ?Inpatient Medications  ?  ?Scheduled Meds: ? arformoterol  15 mcg Nebulization BID  ? aspirin  81 mg Oral Daily  ? atorvastatin  80 mg Oral q1800  ? budesonide (PULMICORT) nebulizer solution  0.5 mg Nebulization BID  ? cyanocobalamin  1,000 mcg Intramuscular Daily  ? dolutegravir  50 mg Oral Daily  ? And  ? lamiVUDine  300 mg Oral Daily  ? pantoprazole  40 mg Oral BID  ? sodium chloride flush  3 mL Intravenous Q12H  ? traZODone  50 mg Oral QHS  ? ?Continuous Infusions: ? cefTRIAXone (ROCEPHIN)  IV 1 g (06/14/21 1004)  ? lactated ringers 75 mL/hr at 06/13/21 1327  ? ?PRN Meds: ?albuterol, diclofenac Sodium, ondansetron **OR** ondansetron (ZOFRAN) IV, polyethylene glycol  ? ?Vital Signs  ?  ?Vitals:  ? 06/13/21 2053 06/14/21 0531 06/14/21 5397 06/14/21 0939  ?BP: 118/69 117/62 (!) 108/49   ?Pulse: 78 72 80   ?Resp: '18 17 18   '$ ?Temp: 98.2 ?F (36.8 ?C) 98.4 ?F (36.9 ?C) 98.2 ?F (36.8 ?C)   ?TempSrc:      ?SpO2: 100% 100% 97% 98%  ?Weight:      ?Height:      ? ? ?Intake/Output Summary (Last 24 hours) at 06/14/2021 1013 ?Last data filed at 06/14/2021 0949 ?Gross per 24 hour  ?Intake 1370.32 ml  ?Output 2950 ml  ?Net -1579.68 ml  ? ?Filed Weights  ? 06/10/21 1814 06/12/21 1129  ?Weight: 62.6 kg 67.9 kg  ? ? ?Telemetry  ?  ?Sinus rhythm- Personally Reviewed ? ?ECG  ?  ?None today- Personally Reviewed ? ?Physical Exam  ? ?General: Comfortable ?Head: Atraumatic, normal size  ?Eyes: PEERLA, EOMI  ?Neck: Supple, normal JVD ?Cardiac: Normal S1, S2; RRR; no murmurs, rubs, or gallops ?Lungs: Clear to auscultation bilaterally ?Abd: Soft, nontender, no hepatomegaly  ?Ext: warm, no edema ?Musculoskeletal: No deformities, BUE and BLE strength normal and equal ?Skin: Warm and dry, no rashes    ?Neuro: Alert and oriented to person, place, time, and situation, CNII-XII grossly intact, no focal deficits  ?Psych: Normal mood and affect  ? ?Labs  ?  ?Chemistry ?Recent Labs  ?Lab 06/11/21 ?0840 06/12/21 ?6734 06/13/21 ?0040  ?NA 139 143 141  ?K 3.4* 3.5 3.6  ?CL 112* 116* 113*  ?CO2 17* 20* 22  ?GLUCOSE 83 84 88  ?BUN 13 6* <5*  ?CREATININE 0.73 0.64 0.66  ?CALCIUM 8.3* 8.0* 8.4*  ?PROT 5.3* 4.8* 5.4*  ?ALBUMIN 2.9* 2.5* 2.8*  ?AST 67* 56* 56*  ?ALT '24 23 25  '$ ?ALKPHOS 49 44 50  ?BILITOT 1.0 0.9 0.7  ?GFRNONAA >60 >60 >60  ?ANIONGAP '10 7 6  '$ ?  ? ?Hematology ?Recent Labs  ?Lab 06/11/21 ?0840 06/12/21 ?1937 06/13/21 ?0040  ?WBC 7.8 6.4 6.8  ?RBC 3.94 3.30* 3.66*  ?HGB 12.9 11.0* 12.0  ?HCT 39.5 33.1* 36.8  ?MCV 100.3* 100.3* 100.5*  ?MCH 32.7 33.3 32.8  ?MCHC 32.7 33.2 32.6  ?RDW 15.7* 15.8* 15.9*  ?PLT 229 169 196  ? ? ?Cardiac EnzymesNo results for input(s): TROPONINI in the last 168 hours. No results for input(s): TROPIPOC in the last 168 hours.  ? ?BNPNo results for input(s): BNP, PROBNP in the last 168 hours.  ? ?  DDimer No results for input(s): DDIMER in the last 168 hours.  ? ?Radiology  ?  ?DG CHEST PORT 1 VIEW ? ?Result Date: 06/12/2021 ?CLINICAL DATA:  Weakness, hypertension EXAM: PORTABLE CHEST 1 VIEW COMPARISON:  06/10/2021 FINDINGS: Lungs are clear. No pneumothorax or pleural effusion. Cardiac size within normal limits. Pulmonary vascularity is normal. No acute bone abnormality. IMPRESSION: No active disease. Electronically Signed   By: Fidela Salisbury M.D.   On: 06/12/2021 22:21  ? ?ECHOCARDIOGRAM COMPLETE ? ?Result Date: 06/13/2021 ?   ECHOCARDIOGRAM REPORT   Patient Name:   [name] [name] Date of Exam: 06/13/2021 Medical Rec #:  [id number]    Height:       59.0 in Accession #:    [id number]   Weight:       149.7 lb Date of Birth:  Oct 29, 1950    BSA:          1.631 m? Patient Age:    71 years     BP:           116/71 mmHg Patient Gender: F            HR:           79 bpm. Exam Location:  Inpatient Procedure:  2D Echo Indications:    abnormal ecg  History:        Patient has prior history of Echocardiogram examinations, most                 recent 04/23/2018. CAD; HIV.  Sonographer:    [place] Referring Phys: [place]  1. Left ventricular ejection fraction, by estimation, is 60 to 65%. The left ventricle has normal function. The left ventricle has no regional wall motion abnormalities. Left ventricular diastolic parameters were normal.  2. Right ventricular systolic function is normal. The right ventricular size is normal.  3. The mitral valve is normal in structure. Trivial mitral valve regurgitation. No evidence of mitral stenosis.  4. The aortic valve is tricuspid. Aortic valve regurgitation is not visualized. No aortic stenosis is present.  5. The inferior vena cava is normal in size with greater than 50% respiratory variability, suggesting right atrial pressure of 3 mmHg. Comparison(s): Prior images unable to be directly viewed, comparison made by report only. No significant change from prior study. FINDINGS  Left Ventricle: Left ventricular ejection fraction, by estimation, is 60 to 65%. The left ventricle has normal function. The left ventricle has no regional wall motion abnormalities. The left ventricular internal cavity size was normal in size. There is  no left ventricular hypertrophy. Left ventricular diastolic parameters were normal. Right Ventricle: The right ventricular size is normal. Right ventricular systolic function is normal. Left Atrium: Left atrial size was normal in size. Right Atrium: Right atrial size was normal in size. Pericardium: There is no evidence of pericardial effusion. Mitral Valve: The mitral valve is normal in structure. Trivial mitral valve regurgitation. No evidence of mitral valve stenosis. Tricuspid Valve: The tricuspid valve is normal in structure. Tricuspid valve regurgitation is not demonstrated. No evidence of tricuspid stenosis. Aortic  Valve: The aortic valve is tricuspid. Aortic valve regurgitation is not visualized. No aortic stenosis is present. Pulmonic Valve: The pulmonic valve was normal in structure. Pulmonic valve regurgitation is trivial. No evidence of pulmonic stenosis. Aorta: The aortic root is normal in size and structure. Venous: The inferior vena cava is normal in size with greater than 50% respiratory variability, suggesting right atrial pressure of  3 mmHg. IAS/Shunts: No atrial level shunt detected by color flow Doppler.  LEFT VENTRICLE PLAX 2D LVIDd:         4.20 cm   Diastology LVIDs:         2.70 cm   LV e' medial:    8.27 cm/s LV PW:         1.10 cm   LV E/e' medial:  11.0 LV IVS:        0.90 cm   LV e' lateral:   9.79 cm/s LVOT diam:     1.70 cm   LV E/e' lateral: 9.3 LV SV:         47 LV SV Index:   29 LVOT Area:     2.27 cm?  RIGHT VENTRICLE         IVC TAPSE (M-mode): 2.2 cm  IVC diam: 1.20 cm LEFT ATRIUM             Index        RIGHT ATRIUM           Index LA diam:        3.50 cm 2.15 cm/m?   RA Area:     10.30 cm? LA Vol (A2C):   52.9 ml 32.44 ml/m?  RA Volume:   20.20 ml  12.39 ml/m? LA Vol (A4C):   48.3 ml 29.62 ml/m? LA Biplane Vol: 52.7 ml 32.32 ml/m?  AORTIC VALVE LVOT Vmax:   101.00 cm/s LVOT Vmean:  67.600 cm/s LVOT VTI:    0.205 m  AORTA Ao Root diam: 2.80 cm Ao Asc diam:  3.40 cm MITRAL VALVE MV Area (PHT): 5.66 cm?    SHUNTS MV Decel Time: 134 msec    Systemic VTI:  0.20 m MV E velocity: 91.30 cm/s  Systemic Diam: 1.70 cm MV A velocity: 91.30 cm/s MV E/A ratio:  1.00 Kirk Ruths MD Electronically signed by Kirk Ruths MD Signature Date/Time: 06/13/2021/11:54:48 AM    Final    ? ?Cardiac Studies  ? ?TTE 06/14/2021 ?IMPRESSIONS  ? ? ? 1. Left ventricular ejection fraction, by estimation, is 60 to 65%. The  ?left ventricle has normal function. The left ventricle has no regional  ?wall motion abnormalities. Left ventricular diastolic parameters were  ?normal.  ? 2. Right ventricular systolic function is normal.  The right ventricular  ?size is normal.  ? 3. The mitral valve is normal in structure. Trivial mitral valve  ?regurgitation. No evidence of mitral stenosis.  ? 4. The aortic valve is tricuspid. Aortic valve

## 2021-06-14 NOTE — Progress Notes (Signed)
?PROGRESS NOTE ? ?[name] [name] LYY:[id number] DOB: [birth year] DOA: 06/10/2021 ?PCP: [place] ? ? LOS: 2 days  ? ?Brief Narrative / Interim history: ?71 year old female with history of HIV, CAD, pulmonary fibrosis with chronic respiratory failure, comes into the hospital with lightheadedness, weakness, dizziness progressively worse over the past week.  Symptoms are worse whenever she gets up and starts moving around.  She also been having nausea, vomiting and poor p.o. intake over the last week.  She reports that she saw couple blood clots in her emesis as well as some dried blood clots when blowing her nose.  She also have been having problems with home oxygen and her tanks ran out yesterday. ? ?Subjective / 24h Interval events: ?Feeling a little bit better but when worked with PT she felt quite weak and was tachycardic.  No chest pain ? ?Assesement and Plan: ?Principal Problem: ?  Orthostatic hypotension near syncope ?Active Problems: ?  Near syncope ?  Nausea & vomiting abdominal pain ?  Chronic respiratory failure with hypoxia (HCC) pulmonary fibrosis asthma ?  CAD (coronary artery disease) ?  E. coli UTI ?  GERD (gastroesophageal reflux disease) ?  IPF (idiopathic pulmonary fibrosis) ([place]) ?  HIV (human immunodeficiency virus infection) ([place]) ?  Abdominal pain ?  Elevated AST (SGOT) ?  Vitamin B12 deficiency ?  Macrocytic anemia ?  Hypotension ? ? ?Assessment and Plan: ?Principal problem ?Orthostatic hypotension near syncope -Patient presents with complaints of weakness and reports of near syncope, possibly in the setting of UTI.  She denies passing out/loss of consciousness.  TSH unremarkable, given intermittent chronic steroids she underwent a stim test which did not show any evidence of adrenal insufficiency.  Hypotension improved but still has some orthostasis with elevated heart rate upon standing.  PT recommends SNF to which patient is agreeable ? ?Active problems ?CAD (coronary  artery disease) -Patient had complained of some chest pain on admission, but pointed epigastrically.  EKG without significant ischemic changes, troponin negative.  Prior history of PCI to the mid LAD in 12/2017 and left circumflex thrombolysis and 04/2020.  Has had recurrent chest pain on 4/3, very typical of her prior MIs and cardiology consulted.  2D echo was done and did not show any wall motion abnormalities, normal EF.  Troponins have remained negative, no further work-up at this point per cardiology as it is felt to be part of her chronic angina.  Resume home Ranexa ? ?B12 deficiency-B12 levels low.  Supplement IM daily for 5 doses.  She reports numbness in her lower extremity as well as poor balance, suspect this contributes to her weakness but does not explain her hypotension ? ?Chronic respiratory failure with hypoxia ([place]) pulmonary fibrosis asthma -she is complaining of shortness of breath but feels somewhat at baseline.  She was hypoxic on room air but stable on 2-3 L at this time.  Continue to monitor.  Continue breathing treatments ? ?Questionable hematemesis-without recurrence.  Resume aspirin and Plavix ? ?Nausea & vomiting abdominal pain -check cortisol as above.  CT scan of the abdomen and pelvis fairly unremarkable.  Continue clear liquid diet, advance as tolerated ? ?E. coli UTI -Prior to arrival.  Patient had been seen by Dr. Aundra Dubin of urology last week and culture reportedly had grown out E. coli, she was started on nitrofurantoin but after a dose or 2 stopped on her own because it was making her "sick".  She has been feeling progressively weak, possibly related to her  untreated UTI.  Cultures here grew pansensitive E. coli, stop ceftriaxone and placed on Augmentin ? ?Elevated AST (SGOT)-mild, monitor.  Unclear significance.  Not a drinker ? ?HIV (human immunodeficiency virus infection) (HCC)-Continue Dovato ? ?GERD (gastroesophageal reflux disease) -Patient with prior history of dysphagia  with schatizi ring status post dilation  home medication regimen includes Protonix 40 mg daily and reportedly states she was told not to take the omeprazole. ? ?Hyperlipidemia-continue statin ? ?Scheduled Meds: ? arformoterol  15 mcg Nebulization BID  ? aspirin  81 mg Oral Daily  ? atorvastatin  80 mg Oral q1800  ? budesonide (PULMICORT) nebulizer solution  0.5 mg Nebulization BID  ? cyanocobalamin  1,000 mcg Intramuscular Daily  ? dolutegravir  50 mg Oral Daily  ? And  ? lamiVUDine  300 mg Oral Daily  ? pantoprazole  40 mg Oral BID  ? ranolazine  500 mg Oral BID  ? sodium chloride flush  3 mL Intravenous Q12H  ? traZODone  50 mg Oral QHS  ? ?Continuous Infusions: ? cefTRIAXone (ROCEPHIN)  IV 1 g (06/14/21 1004)  ? lactated ringers 75 mL/hr at 06/13/21 1327  ? ?PRN Meds:.albuterol, diclofenac Sodium, ondansetron **OR** ondansetron (ZOFRAN) IV, polyethylene glycol ? ?Diet Orders (From admission, onward)  ? ?  Start     Ordered  ? 06/13/21 1109  Diet regular Room service appropriate? Yes; Fluid consistency: Thin  Diet effective now       ?Question Answer Comment  ?Room service appropriate? Yes   ?Fluid consistency: Thin   ?  ? 06/13/21 1108  ? ?  ?  ? ?  ? ? ?DVT prophylaxis: Place and maintain sequential compression device Start: 06/11/21 0902 ?SCDs Start: 06/11/21 0752 ? ? ?Lab Results  ?Component Value Date  ? PLT 196 06/13/2021  ? ? ?  Code Status: Full Code ? ?Family Communication: no family at bedside  ? ?Status is: Inpatient ? ?Inpatient: Active chest pain, persistent weakness ? ?Level of care: Telemetry Medical ? ?Consultants:  ?None ? ?Procedures:  ?none ? ?Microbiology  ?none ? ?Antimicrobials: ?none  ? ? ?Objective: ?Vitals:  ? 06/13/21 2053 06/14/21 0531 06/14/21 6378 06/14/21 0939  ?BP: 118/69 117/62 (!) 108/49   ?Pulse: 78 72 80   ?Resp: '18 17 18   '$ ?Temp: 98.2 ?F (36.8 ?C) 98.4 ?F (36.9 ?C) 98.2 ?F (36.8 ?C)   ?TempSrc:      ?SpO2: 100% 100% 97% 98%  ?Weight:      ?Height:      ? ? ?Intake/Output Summary  (Last 24 hours) at 06/14/2021 1355 ?Last data filed at 06/14/2021 0949 ?Gross per 24 hour  ?Intake 360 ml  ?Output 2250 ml  ?Net -1890 ml  ? ? ?Wt Readings from Last 3 Encounters:  ?06/12/21 67.9 kg  ?08/27/19 65.5 kg  ?07/05/19 63.5 kg  ? ? ?Examination: ? ?Constitutional: NAD ?Eyes: lids and conjunctivae normal, no scleral icterus ?ENMT: mmm ?Neck: normal, supple ?Respiratory: clear to auscultation bilaterally, no wheezing, no crackles.  ?Cardiovascular: Regular rate and rhythm, no murmurs / rubs / gallops.  ?Abdomen: soft, no distention, no tenderness. Bowel sounds positive.  ?Skin: no rashes ?Neurologic: no focal deficits, equal strength ? ? ?Data Reviewed: I have independently reviewed following labs and imaging studies  ? ?CBC ?Recent Labs  ?Lab 06/10/21 ?1903 06/11/21 ?0840 06/12/21 ?5885 06/13/21 ?0040  ?WBC 7.2 7.8 6.4 6.8  ?HGB 13.6 12.9 11.0* 12.0  ?HCT 42.7 39.5 33.1* 36.8  ?PLT 232 229 169 196  ?MCV  100.9* 100.3* 100.3* 100.5*  ?MCH 32.2 32.7 33.3 32.8  ?MCHC 31.9 32.7 33.2 32.6  ?RDW 15.8* 15.7* 15.8* 15.9*  ?LYMPHSABS 1.2  --  1.4  --   ?MONOABS 1.1*  --  1.0  --   ?EOSABS 0.0  --  0.1  --   ?BASOSABS 0.0  --  0.0  --   ? ? ? ?Recent Labs  ?Lab 06/10/21 ?1903 06/11/21 ?0840 06/12/21 ?7425 06/12/21 ?9563 06/13/21 ?0040  ?NA 139 139 143  --  141  ?K 4.0 3.4* 3.5  --  3.6  ?CL 108 112* 116*  --  113*  ?CO2 20* 17* 20*  --  22  ?GLUCOSE 106* 83 84  --  88  ?BUN 12 13 6*  --  <5*  ?CREATININE 0.70 0.73 0.64  --  0.66  ?CALCIUM 8.9 8.3* 8.0*  --  8.4*  ?AST 73* 67* 56*  --  56*  ?ALT '29 24 23  '$ --  25  ?ALKPHOS 60 49 44  --  50  ?BILITOT 0.8 1.0 0.9  --  0.7  ?ALBUMIN 3.4* 2.9* 2.5*  --  2.8*  ?PROCALCITON  --  <0.10  --   --   --   ?LATICACIDVEN  --   --  0.9 1.8  --   ?TSH  --  2.028  --   --   --   ? ? ? ?------------------------------------------------------------------------------------------------------------------ ?No results for input(s): CHOL, HDL, LDLCALC, TRIG, CHOLHDL, LDLDIRECT in the last 72  hours. ? ?No results found for: HGBA1C ?------------------------------------------------------------------------------------------------------------------ ?No results for input(s): TSH, T4TOTAL, T3FREE, THYROIDAB

## 2021-06-14 NOTE — Progress Notes (Signed)
Physical Therapy Treatment ?Patient Details ?Name: [name] [name] ?MRN: [id number] ?DOB: 03/03/1951 ?Today's Date: 06/14/2021 ? ? ?History of Present Illness 71 year old female, comes into the hospital with lightheadedness, weakness, dizziness progressively worse over the past week.  Symptoms are worse whenever she gets up and starts moving around.  She also been having nausea, vomiting and poor p.o. intake over the last week.  She reports that she saw couple blood clots in her emesis as well as some dried blood clots when blowing her nose.  She also have been having problems with home oxygen and her tanks ran the day before admission; with history of HIV, CAD, pulmonary fibrosis with chronic respiratory failure ? ?  ?PT Comments  ? ? Continuing work on functional mobility and activity tolerance;  Session focused on serial BPs in lying, sitting, standing, and pushing standing time to tolerance (with chair close for safety); Ted hose on (knee high) and abdominal binder as well during session to see if they are helpful for increasing activity tolerance; Pt presented largely the same as previous PT session, with reports of dizziness in standing, and tending to have an incr in HR of approx 20 beats between sittign and standing;  ? ?Pt is alone much of the day at home, and she has a flight of steps to enter; She reports she is concerned about her ability to be home safely at this time, and this PT agrees; it is worth considering a post-acute rehab stay when she is medically ready to maximize independence and safety with mobility with the goal of returning home; Notified the Tam via secure chat   ?Recommendations for follow up therapy are one component of a multi-disciplinary discharge planning process, led by the attending physician.  Recommendations may be updated based on patient status, additional functional criteria and insurance authorization. ? ?Follow Up Recommendations ? Skilled nursing-short term rehab (<3  hours/day) ?  ?  ?Assistance Recommended at Discharge Intermittent Supervision/Assistance  ?Patient can return home with the following A little help with walking and/or transfers;Help with stairs or ramp for entrance ?  ?Equipment Recommendations ? Rollator (4 wheels);Other (comment);BSC/3in1 (consider shower seat)  ?  ?Recommendations for Other Services OT consult (ordered per protocol) ? ? ?  ?Precautions / Restrictions Precautions ?Precautions: Fall ?Precaution Comments: watch for dizziness ?Restrictions ?Weight Bearing Restrictions: No  ?  ? ?Mobility ? Bed Mobility ?Overal bed mobility: Needs Assistance ?Bed Mobility: Supine to Sit ?  ?  ?Supine to sit: Supervision ?  ?  ?General bed mobility comments: Supervision for safety ?  ? ?Transfers ?Overall transfer level: Needs assistance ?Equipment used: Rolling walker (2 wheels) ?Transfers: Sit to/from Stand, Bed to chair/wheelchair/BSC ?Sit to Stand: Min assist ?  ?Step pivot transfers: Min assist ?  ?  ?  ?General transfer comment: Min assist to steady and close guard during pivotal steps bed to recliner; cues to self-monitor for activity tolerance throughout uprihgt and standing activities ?  ? ?Ambulation/Gait ?  ?  ?  ?  ?  ?  ?  ?General Gait Details: Presyncopal symptoms in standing ? ? ?Stairs ?  ?  ?  ?  ?  ? ? ?Wheelchair Mobility ?  ? ?Modified Rankin (Stroke Patients Only) ?  ? ? ?  ?Balance Overall balance assessment: Mild deficits observed, not formally tested ?  ?  ?  ?  ?  ?  ?  ?  ?  ?  ?  ?  ?  ?  ?  ?  ?  ?  ?  ? ?  ?  Cognition Arousal/Alertness: Awake/alert ?Behavior During Therapy: [facility] for tasks assessed/performed ?Overall Cognitive Status: Within Functional Limits for tasks assessed ?  ?  ?  ?  ?  ?  ?  ?  ?  ?  ?  ?  ?  ?  ?  ?  ?General Comments: Showing good self-monitor for activity tolernace ?  ?  ? ?  ?Exercises   ? ?  ?General Comments General comments (skin integrity, edema, etc.): See vitals flow sheet.  ?  ?  ? ?Pertinent Vitals/Pain  Pain Assessment ?Pain Assessment: No/denies pain  ? ? ?Home Living   ?  ?  ?  ?  ?  ?  ?  ?  ?  ?   ?  ?Prior Function    ?  ?  ?   ? ?PT Goals (current goals can now be found in the care plan section) Acute Rehab PT Goals ?Patient Stated Goal: Be able to manage the stairs to get into her hoem so she can walk her dog; back to normal ?PT Goal Formulation: With patient ?Time For Goal Achievement: 06/26/21 ?Potential to Achieve Goals: Good ?Progress towards PT goals: Progressing toward goals (slowly) ? ?  ?Frequency ? ? ? Min 3X/week ? ? ? ?  ?PT Plan Discharge plan needs to be updated  ? ? ?Co-evaluation   ?  ?  ?  ?  ? ?  ?AM-PAC PT "6 Clicks" Mobility   ?Outcome Measure ? Help needed turning from your back to your side while in a flat bed without using bedrails?: None ?Help needed moving from lying on your back to sitting on the side of a flat bed without using bedrails?: None ?Help needed moving to and from a bed to a chair (including a wheelchair)?: A Little ?Help needed standing up from a chair using your arms (e.g., wheelchair or bedside chair)?: A Little ?Help needed to walk in hospital room?: A Lot ?Help needed climbing 3-5 steps with a railing? : Total ?6 Click Score: 17 ? ?  ?End of Session Equipment Utilized During Treatment: Gait belt ?Activity Tolerance: Other (comment) (limited by feeling symptomatic for weakness/dizziness) ?Patient left: in chair;with call bell/phone within reach (REclined and feet up) ?Nurse Communication: Mobility status (BPs) ?PT Visit Diagnosis: Unsteadiness on feet (R26.81);Other abnormalities of gait and mobility (R26.89) ?  ? ? ?Time: 9233-0076 ?PT Time Calculation (min) (ACUTE ONLY): 55 min ? ?Charges:  $Therapeutic Activity: 38-52 mins ?$Self Care/Home Management: 8-22          ?          ? ?Roney Marion, PT  ?Acute Rehabilitation Services ?Pager [phone] ?Office [phone] ? ? ? ?[name] ?06/14/2021, 2:28 PM ? ?

## 2021-06-15 LAB — CBC
HCT: 33.2 % — ABNORMAL LOW (ref 36.0–46.0)
Hemoglobin: 11 g/dL — ABNORMAL LOW (ref 12.0–15.0)
MCH: 32.7 pg (ref 26.0–34.0)
MCHC: 33.1 g/dL (ref 30.0–36.0)
MCV: 98.8 fL (ref 80.0–100.0)
Platelets: 165 10*3/uL (ref 150–400)
RBC: 3.36 MIL/uL — ABNORMAL LOW (ref 3.87–5.11)
RDW: 16.2 % — ABNORMAL HIGH (ref 11.5–15.5)
WBC: 5.2 10*3/uL (ref 4.0–10.5)
nRBC: 0 % (ref 0.0–0.2)

## 2021-06-15 LAB — BASIC METABOLIC PANEL
Anion gap: 7 (ref 5–15)
BUN: 5 mg/dL — ABNORMAL LOW (ref 8–23)
CO2: 23 mmol/L (ref 22–32)
Calcium: 8.3 mg/dL — ABNORMAL LOW (ref 8.9–10.3)
Chloride: 111 mmol/L (ref 98–111)
Creatinine, Ser: 0.62 mg/dL (ref 0.44–1.00)
GFR, Estimated: >60 mL/min (ref >60)
Glucose, Bld: 92 mg/dL (ref 70–99)
Potassium: 3.5 mmol/L (ref 3.5–5.1)
Sodium: 141 mmol/L (ref 135–145)

## 2021-06-15 MED ORDER — MIDODRINE HCL 5 MG PO TABS
10.0000 mg | ORAL_TABLET | Freq: Two times a day (BID) | ORAL | Status: DC
  Administered 2021-06-15 – 2021-06-17 (×5): 10 mg via ORAL
  Filled 2021-06-15 (×5): qty 2

## 2021-06-15 MED ORDER — ENOXAPARIN SODIUM 40 MG/0.4ML IJ SOSY
40.0000 mg | PREFILLED_SYRINGE | INTRAMUSCULAR | Status: DC
Start: 2021-06-15 — End: 2021-06-17
  Administered 2021-06-15 – 2021-06-16 (×2): 40 mg via SUBCUTANEOUS
  Filled 2021-06-15 (×2): qty 0.4

## 2021-06-15 NOTE — Progress Notes (Signed)
? ?Progress Note ? ?Patient Name: [name] [name] ?Date of Encounter: 06/15/2021 ? ?Primary Cardiologist: None  ? ?Subjective  ? ?Patient seen and examined at her bedside.  ? ? ?Inpatient Medications  ?  ?Scheduled Meds: ? amoxicillin-clavulanate  1 tablet Oral Q12H  ? arformoterol  15 mcg Nebulization BID  ? aspirin  81 mg Oral Daily  ? atorvastatin  80 mg Oral q1800  ? budesonide (PULMICORT) nebulizer solution  0.5 mg Nebulization BID  ? clopidogrel  75 mg Oral Daily  ? cyanocobalamin  1,000 mcg Intramuscular Daily  ? dolutegravir  50 mg Oral Daily  ? And  ? lamiVUDine  300 mg Oral Daily  ? midodrine  10 mg Oral BID WC  ? pantoprazole  40 mg Oral BID  ? ranolazine  500 mg Oral BID  ? sodium chloride flush  3 mL Intravenous Q12H  ? traZODone  50 mg Oral QHS  ? ?Continuous Infusions: ? ? ?PRN Meds: ?albuterol, diclofenac Sodium, ondansetron **OR** ondansetron (ZOFRAN) IV, polyethylene glycol  ? ?Vital Signs  ?  ?Vitals:  ? 06/14/21 2058 06/15/21 0507 06/15/21 0815 06/15/21 8413  ?BP:  116/66  105/66  ?Pulse:  69 81 78  ?Resp:  '16 18 18  '$ ?Temp:  97.9 ?F (36.6 ?C)  98.3 ?F (36.8 ?C)  ?TempSrc:  Oral  Oral  ?SpO2: 96% 100% 98%   ?Weight:      ?Height:      ? ? ?Intake/Output Summary (Last 24 hours) at 06/15/2021 1045 ?Last data filed at 06/15/2021 0900 ?Gross per 24 hour  ?Intake 1653.8 ml  ?Output 1000 ml  ?Net 653.8 ml  ? ?Filed Weights  ? 06/10/21 1814 06/12/21 1129  ?Weight: 62.6 kg 67.9 kg  ? ? ?Telemetry  ?  ?Sinus rhythm- Personally Reviewed ? ?ECG  ?  ?None today- Personally Reviewed ? ?Physical Exam  ? ?General: Comfortable ?Head: Atraumatic, normal size  ?Eyes: PEERLA, EOMI  ?Neck: Supple, normal JVD ?Cardiac: Normal S1, S2; RRR; no murmurs, rubs, or gallops ?Lungs: Clear to auscultation bilaterally ?Abd: Soft, nontender, no hepatomegaly  ?Ext: warm, no edema ?Musculoskeletal: No deformities, BUE and BLE strength normal and equal ?Skin: Warm and dry, no rashes   ?Neuro: Alert and oriented to person, place, time, and  situation, CNII-XII grossly intact, no focal deficits  ?Psych: Normal mood and affect  ? ?Labs  ?  ?Chemistry ?Recent Labs  ?Lab 06/11/21 ?0840 06/12/21 ?2440 06/13/21 ?0040 06/15/21 ?0452  ?NA 139 143 141 141  ?K 3.4* 3.5 3.6 3.5  ?CL 112* 116* 113* 111  ?CO2 17* 20* 22 23  ?GLUCOSE 83 84 88 92  ?BUN 13 6* <5* 5*  ?CREATININE 0.73 0.64 0.66 0.62  ?CALCIUM 8.3* 8.0* 8.4* 8.3*  ?PROT 5.3* 4.8* 5.4*  --   ?ALBUMIN 2.9* 2.5* 2.8*  --   ?AST 67* 56* 56*  --   ?ALT '24 23 25  '$ --   ?ALKPHOS 49 44 50  --   ?BILITOT 1.0 0.9 0.7  --   ?GFRNONAA >60 >60 >60 >60  ?ANIONGAP '10 7 6 7  '$ ?  ? ?Hematology ?Recent Labs  ?Lab 06/12/21 ?0718 06/13/21 ?0040 06/15/21 ?1027  ?WBC 6.4 6.8 5.2  ?RBC 3.30* 3.66* 3.36*  ?HGB 11.0* 12.0 11.0*  ?HCT 33.1* 36.8 33.2*  ?MCV 100.3* 100.5* 98.8  ?MCH 33.3 32.8 32.7  ?MCHC 33.2 32.6 33.1  ?RDW 15.8* 15.9* 16.2*  ?PLT 169 196 165  ? ? ?Cardiac EnzymesNo results for input(s): TROPONINI  in the last 168 hours. No results for input(s): TROPIPOC in the last 168 hours.  ? ?BNPNo results for input(s): BNP, PROBNP in the last 168 hours.  ? ?DDimer No results for input(s): DDIMER in the last 168 hours.  ? ?Radiology  ?  ?No results found. ? ?Cardiac Studies  ? ?TTE 06/14/2021 ?IMPRESSIONS  ? ? ? 1. Left ventricular ejection fraction, by estimation, is 60 to 65%. The  ?left ventricle has normal function. The left ventricle has no regional  ?wall motion abnormalities. Left ventricular diastolic parameters were  ?normal.  ? 2. Right ventricular systolic function is normal. The right ventricular  ?size is normal.  ? 3. The mitral valve is normal in structure. Trivial mitral valve  ?regurgitation. No evidence of mitral stenosis.  ? 4. The aortic valve is tricuspid. Aortic valve regurgitation is not  ?visualized. No aortic stenosis is present.  ? 5. The inferior vena cava is normal in size with greater than 50%  ?respiratory variability, suggesting right atrial pressure of 3 mmHg.  ? ?Comparison(s): Prior images  unable to be directly viewed, comparison made  ?by report only. No significant change from prior study.  ? ?FINDINGS  ? Left Ventricle: Left ventricular ejection fraction, by estimation, is 60  ?to 65%. The left ventricle has normal function. The left ventricle has no  ?regional wall motion abnormalities. The left ventricular internal cavity  ?size was normal in size. There is  ? no left ventricular hypertrophy. Left ventricular diastolic parameters  ?were normal.  ? ?Right Ventricle: The right ventricular size is normal. Right ventricular  ?systolic function is normal.  ? ?Left Atrium: Left atrial size was normal in size.  ? ?Right Atrium: Right atrial size was normal in size.  ? ?Pericardium: There is no evidence of pericardial effusion.  ? ?Mitral Valve: The mitral valve is normal in structure. Trivial mitral  ?valve regurgitation. No evidence of mitral valve stenosis.  ? ?Tricuspid Valve: The tricuspid valve is normal in structure. Tricuspid  ?valve regurgitation is not demonstrated. No evidence of tricuspid  ?stenosis.  ? ?Aortic Valve: The aortic valve is tricuspid. Aortic valve regurgitation is  ?not visualized. No aortic stenosis is present.  ? ?Pulmonic Valve: The pulmonic valve was normal in structure. Pulmonic valve  ?regurgitation is trivial. No evidence of pulmonic stenosis.  ? ?Aorta: The aortic root is normal in size and structure.  ? ?Venous: The inferior vena cava is normal in size with greater than 50%  ?respiratory variability, suggesting right atrial pressure of 3 mmHg.  ? ?IAS/Shunts: No atrial level shunt detected by color flow Doppler.  ? ?Patient Profile  ?   ?71 y.o. female with history of coronary artery disease status post drug-eluting stent to the mid LAD in 2019, noted chronic stable angina is on antianginals which is being managed by her primary cardiologist in [facility], hypertension, hyperlipidemia, pulmonary fibrosis and asthma. ? ?Assessment & Plan  ?  ?CAD with chronic stable  angina-she had been taken off anticoagulants on admission.  Now with recurrence of her stable angina.  Please resume her Ranexa 500 mg twice daily.  Hold off Toprol XL for now. ? ?Suspect postural orthostatic tachycardia-agree with start of midodrine.  Please continue also with compression stockings and abdominal binder.  Continue to hold the losartan as well as Toprol-XL for now.   ? ?Hyperlipidemia-continue Lipitor. ?   ? ?For questions or updates, please contact [place] ?Please consult [email or web address] for contact info under Cardiology/STEMI. ?  ?   ?  Signed, ?Berniece Salines, DO  ?06/15/2021, 10:45 AM    ?

## 2021-06-15 NOTE — Progress Notes (Signed)
Physical Therapy Treatment ?Patient Details ?Name: [name] [name] ?MRN: [id number] ?DOB: 17-Jul-1950 ?Today's Date: 06/15/2021 ? ? ?History of Present Illness 71 year old female, comes into the hospital with lightheadedness, weakness, dizziness progressively worse over the past week.  Symptoms are worse whenever she gets up and starts moving around.  She also been having nausea, vomiting and poor p.o. intake over the last week.  She reports that she saw couple blood clots in her emesis as well as some dried blood clots when blowing her nose.  She also have been having problems with home oxygen and her tanks ran the day before admission; with history of HIV, CAD, pulmonary fibrosis with chronic respiratory failure ? ?  ?PT Comments  ? ? Continuing work on functional mobility and activity tolerance;  Session focused on monitor of activity tolerance while safely progressing ambulation; See vitals flow sheet for full details;  ? ?Highlights: BP drop from 111/69 supine to 75/62 in standing (at sink) after 11 minutes of activity/brief walking; HR 90s/low 100s consistently in sitting; HR above 110s (ranged 110-125) in standing and with light activity; Unable to stand long enough to get a standing BP after short in-room walk back to recliner; Very symptomatic, needed to sit;  ? ?Dr. Broadus John and Ulice Dash, RN aware of pt status; ? ?Discussed dc planning with Ms. Myhand and Almyra Free, OT -- perhaps prematurely, as she doesn't seem to be ready for discharge yet;  ? ?Pt prefers to go home, and we discussed the need for strict self-monitor for presyncopal symptoms; She indicated that she will not go up and down her stairs alone, and that she sits immediately when she gets dizzy; If she can manage with a rollator safely, and consistently sits when symptomatic, we can consider home with HHPT/OT and HHAide -- safety concerns remain for this PT; we can consider an ambulance ride home to take care of the steps to enter her home, but she will still  need to negotiate that flight of stairs for follow up appointments as an ouptpt;  ? ?We can consider SNF for short-term post-acute rehab as well (not pt's preference) to maximize independence and safety with mobility and ADLs prior to dc home;  ? ?Pt has questions re: getting her supplemental O2 for the home.  ?Recommendations for follow up therapy are one component of a multi-disciplinary discharge planning process, led by the attending physician.  Recommendations may be updated based on patient status, additional functional criteria and insurance authorization. ? ?Follow Up Recommendations ?  See discussion above ?  ?  ?Assistance Recommended at Discharge Intermittent Supervision/Assistance  ?Patient can return home with the following Help with stairs or ramp for entrance;A little help with walking and/or transfers ?  ?Equipment Recommendations ? Rollator (4 wheels);Other (comment);BSC/3in1 (consider shower seat)  ?  ?Recommendations for Other Services OT consult ? ? ?  ?Precautions / Restrictions Precautions ?Precautions: Fall ?Precaution Comments: watch for dizziness; hypotension/tachycardia with upright activity ?Required Braces or Orthoses: Other Brace ?Other Brace: Have been using an abdominal binder and ted hose for the past few sessions ?Restrictions ?Weight Bearing Restrictions: No  ?  ? ?Mobility ? Bed Mobility ?Overal bed mobility: Needs Assistance ?Bed Mobility: Supine to Sit ?  ?  ?Supine to sit: Supervision ?  ?  ?General bed mobility comments: Supervision for safety ?  ? ?Transfers ?Overall transfer level: Needs assistance ?Equipment used: Rolling walker (2 wheels) ?Transfers: Sit to/from Stand, Bed to chair/wheelchair/BSC ?Sit to Stand: Min assist ?  ?Step pivot  transfers: Min assist ?  ?  ?  ?General transfer comment: Needs reminders for safe hand placementMin assist to steady and close guard during pivotal steps bed to recliner; cues to self-monitor for activity tolerance throughout uprihgt and  standing activities ?  ? ?Ambulation/Gait ?Ambulation/Gait assistance: Min assist, +2 safety/equipment ?Gait Distance (Feet): 14 Feet (x2; around room to stand at sink; seated rest break; then back around room to recliner at window) ?Assistive device: Rolling walker (2 wheels) ?Gait Pattern/deviations: Step-through pattern ?  ?  ?  ?General Gait Details: Close watch for syncopal symptoms throughout brief walks in room; cues for self-monitor ? ? ?Stairs ?  ?  ?  ?  ?General stair comments: We discussed her flight of steps to enter her home; she has a landing, and suggested pt have an option to sit on the landing; Pt indicated she does not plan to go out once home, and she will have assist whenever she is on the steps ? ? ?Wheelchair Mobility ?  ? ?Modified Rankin (Stroke Patients Only) ?  ? ? ?  ?Balance Overall balance assessment: Mild deficits observed, not formally tested ?  ?  ?  ?  ?  ?  ?  ?  ?  ?  ?  ?  ?  ?  ?  ?  ?  ?  ?  ? ?  ?Cognition Arousal/Alertness: Awake/alert ?Behavior During Therapy: [facility] for tasks assessed/performed ?Overall Cognitive Status: Within Functional Limits for tasks assessed ?  ?  ?  ?  ?  ?  ?  ?  ?  ?  ?  ?  ?  ?  ?  ?  ?General Comments: Showing good self-monitor for activity tolerance; will need to show safe, independent, and consistent use of Rollator RW for sitting at onset of presyncopal symptoms ?  ?  ? ?  ?Exercises   ? ?  ?General Comments General comments (skin integrity, edema, etc.): See vitals flow sheet. ? ?  ?  ? ?Pertinent Vitals/Pain Pain Assessment ?Pain Assessment: No/denies pain  ? ? ?Home Living   ?  ?  ?  ?  ?  ?  ?  ?  ?  ?   ?  ?Prior Function    ?  ?  ?   ? ?PT Goals (current goals can now be found in the care plan section) Acute Rehab PT Goals ?Patient Stated Goal: Be able to manage the stairs to get into her hoem so she can walk her dog; back to normal ?PT Goal Formulation: With patient ?Time For Goal Achievement: 06/26/21 ?Potential to Achieve Goals:  Good ?Progress towards PT goals: Progressing toward goals (Slowly) ? ?  ?Frequency ? ? ? Min 3X/week ? ? ? ?  ?PT Plan Current plan remains appropriate (though pt did state she would prefer to go home)  ? ? ?Co-evaluation   ?  ?  ?  ?  ? ?  ?AM-PAC PT "6 Clicks" Mobility   ?Outcome Measure ? Help needed turning from your back to your side while in a flat bed without using bedrails?: None ?Help needed moving from lying on your back to sitting on the side of a flat bed without using bedrails?: None ?Help needed moving to and from a bed to a chair (including a wheelchair)?: A Little ?Help needed standing up from a chair using your arms (e.g., wheelchair or bedside chair)?: A Little ?Help needed to walk in hospital room?: A Lot ?Help  needed climbing 3-5 steps with a railing? : Total ?6 Click Score: 17 ? ?  ?End of Session Equipment Utilized During Treatment: Gait belt ?Activity Tolerance: Other (comment) (limited by feeling symptomatic for weakness/dizziness) ?Patient left: in chair;with call bell/phone within reach (REclined and feet up) ?Nurse Communication: Mobility status (BPs) ?PT Visit Diagnosis: Unsteadiness on feet (R26.81);Other abnormalities of gait and mobility (R26.89) ?  ? ? ?Time: [phone] ?PT Time Calculation (min) (ACUTE ONLY): 24 min ? ?Charges:  $Gait Training: 8-22 mins ?$Therapeutic Activity: 8-22 mins          ?          ? ?Roney Marion, PT  ?Acute Rehabilitation Services ?Pager [phone] ?Office [phone] ? ? ? ?[name] ?06/15/2021, 10:02 AM ? ?

## 2021-06-15 NOTE — Evaluation (Signed)
Occupational Therapy Evaluation ?Patient Details ?Name: [name] [name] ?MRN: [id number] ?DOB: 02/26/1951 ?Today's Date: 06/15/2021 ? ? ?History of Present Illness 71 year old female, comes into the hospital with lightheadedness, weakness, dizziness progressively worse over the past week.  Symptoms are worse whenever she gets up and starts moving around.  She also been having nausea, vomiting and poor p.o. intake over the last week.  She reports that she saw couple blood clots in her emesis as well as some dried blood clots when blowing her nose.  She also have been having problems with home oxygen and her tanks ran the day before admission; with history of HIV, CAD, pulmonary fibrosis with chronic respiratory failure  ? ?Clinical Impression ?  ?PTA, pt lives with daughter and young grandson. Pt typically Independent with ADLs, household IADLs and mobility. Pt presents now with minor deficits in strength, endurance and dynamic standing balance though functional abilities primarily affected by symptomatic orthostatic hypotension/tachycardia (see PT note/flowsheet for vitals). Pt Setup for UB ADL, min guard for LB ADLs and min guard for mobility with RW. Emphasis on safety precautions with symptoms with plans to trial Rollator for quick seated rest breaks with onset of symptoms to decrease fall risk. Pending improvements in orthostasis, anticipate pt can DC home with [facility] and intermittent family support.   ?   ? ?Recommendations for follow up therapy are one component of a multi-disciplinary discharge planning process, led by the attending physician.  Recommendations may be updated based on patient status, additional functional criteria and insurance authorization.  ? ?Follow Up Recommendations ? Home health OT  ?  ?Assistance Recommended at Discharge Intermittent Supervision/Assistance  ?Patient can return home with the following A little help with bathing/dressing/bathroom;Assistance with cooking/housework;Assist for  transportation;Help with stairs or ramp for entrance ? ?  ?Functional Status Assessment ? Patient has had a recent decline in their functional status and demonstrates the ability to make significant improvements in function in a reasonable and predictable amount of time.  ?Equipment Recommendations ? Tub/shower seat  ?  ?Recommendations for Other Services   ? ? ?  ?Precautions / Restrictions Precautions ?Precautions: Fall ?Precaution Comments: watch for dizziness; hypotension/tachycardia with upright activity ?Required Braces or Orthoses: Other Brace ?Other Brace: Have been using an abdominal binder and ted hose for the past few sessions ?Restrictions ?Weight Bearing Restrictions: No  ? ?  ? ?Mobility Bed Mobility ?Overal bed mobility: Needs Assistance ?Bed Mobility: Supine to Sit ?  ?  ?Supine to sit: Supervision ?  ?  ?General bed mobility comments: Supervision for safety ?  ? ?Transfers ?Overall transfer level: Needs assistance ?Equipment used: Rolling walker (2 wheels) ?Transfers: Sit to/from Stand, Bed to chair/wheelchair/BSC ?Sit to Stand: Min assist ?  ?  ?Step pivot transfers: Min assist ?  ?  ?General transfer comment: Needs reminders for safe hand placementMin assist to steady and close guard during pivotal steps bed to [facility] cues to self-monitor for activity tolerance throughout uprihgt and standing activities ?  ? ?  ?Balance Overall balance assessment: Mild deficits observed, not formally tested ?  ?  ?  ?  ?  ?  ?  ?  ?  ?  ?  ?  ?  ?  ?  ?  ?  ?  ?   ? ?ADL either performed or assessed with clinical judgement  ? ?ADL Overall ADL's : Needs assistance/impaired ?Eating/Feeding: Independent;Sitting ?  ?Grooming: Min guard;Standing;Wash/dry face ?Grooming Details (indicate cue type and reason): for safety standing at  sink, onset of "weakness"/orthostatic symptoms ?Upper Body Bathing: Set up;Sitting ?  ?Lower Body Bathing: Min guard;Sit to/from stand ?  ?Upper Body Dressing : Set up;Sitting ?  ?Lower Body  Dressing: Min guard;Sit to/from stand ?Lower Body Dressing Details (indicate cue type and reason): able to don socks bringing feet to self bed level ?Toilet Transfer: Min guard;Ambulation;Rolling walker (2 wheels) ?  ?Toileting- Water quality scientist and Hygiene: Min guard;Sit to/from stand ?  ?  ?  ?Functional mobility during ADLs: Min guard;Rolling walker (2 wheels) ?General ADL Comments: Limited more by orthostatic symptoms rather than physical impairments. discussed fall prevention, safety strategies, use of rollator for quick seated rest break with onset of symptoms, keeping phone on her at all times. Pt can also call people with her smart watch (advised to use this for timed seated and standing prior to progression of activity at home when initially getting up, as well as monitoring HR)  ? ? ? ?Vision Baseline Vision/History: 1 Wears glasses ?Ability to See in Adequate Light: 0 Adequate ?Patient Visual Report: No change from baseline ?Vision Assessment?: No apparent visual deficits  ?   ?Perception   ?  ?Praxis   ?  ? ?Pertinent Vitals/Pain Pain Assessment ?Pain Assessment: No/denies pain  ? ? ? ?Hand Dominance Right ?  ?Extremity/Trunk Assessment Upper Extremity Assessment ?Upper Extremity Assessment: Generalized weakness ?  ?Lower Extremity Assessment ?Lower Extremity Assessment: Defer to PT evaluation ?  ?Cervical / Trunk Assessment ?Cervical / Trunk Assessment: Normal ?  ?Communication Communication ?Communication: No difficulties ?  ?Cognition Arousal/Alertness: Awake/alert ?Behavior During Therapy: [facility] for tasks assessed/performed ?Overall Cognitive Status: Within Functional Limits for tasks assessed ?  ?  ?  ?  ?  ?  ?  ?  ?  ?  ?  ?  ?  ?  ?  ?  ?  ?  ?  ?General Comments  . ?  ?Exercises   ?  ?Shoulder Instructions    ? ? ?Home Living Family/patient expects to be discharged to:: Private residence ?Living Arrangements: Children;Other relatives (62 y/o grandson) ?Available Help at Discharge:  Family;Available PRN/intermittently (daughter works days, has 81 y/o grandson at school during the day) ?Type of Home: Apartment ?Home Access: Stairs to enter ?Entrance Stairs-Number of Steps: flight (7 + 7) ?Entrance Stairs-Rails: Right ?Home Layout: One level ?  ?  ?Bathroom Shower/Tub: Tub/shower unit ?  ?Bathroom Toilet: Standard ?  ?  ?Home Equipment: Conservation officer, nature (2 wheels);Cane - single point ?  ?  ?  ? ?  ?Prior Functioning/Environment Prior Level of Function : Independent/Modified Independent ?  ?  ?  ?  ?  ?  ?Mobility Comments: Reports no difficulty with flight of steps to enter her home prior to onset of feeling bad ?ADLs Comments: Independent with ADLs, household IADLs. has small dog she cares for but has been unable to walk her consistently in past year (since fall) ?  ? ?  ?  ?OT Problem List: Decreased strength;Decreased activity tolerance;Impaired balance (sitting and/or standing);Decreased knowledge of use of DME or AE;Decreased knowledge of precautions ?  ?   ?OT Treatment/Interventions: Self-care/ADL training;Therapeutic exercise;Energy conservation;DME and/or AE instruction;Therapeutic activities;Patient/family education;Balance training  ?  ?OT Goals(Current goals can be found in the care plan section) Acute Rehab OT Goals ?Patient Stated Goal: go home, be able to walk dog again ?OT Goal Formulation: With patient ?Time For Goal Achievement: 06/29/21 ?Potential to Achieve Goals: Good ?ADL Goals ?Pt Will Transfer to Toilet: with modified independence;ambulating ?Pt/caregiver  will Perform Home Exercise Program: Increased strength;Both right and left upper extremity;With theraband;Independently;With written HEP provided ?Additional ADL Goal #1: Pt to demo ability to monitor orthostatic symptoms and implement safety precautions to prevent fall independently ?Additional ADL Goal #2: Pt to increase standing tolerance with ADLs/IADLs > 7 min with stable vitals  ?OT Frequency: Min 2X/week ?   ? ?Co-evaluation   ?  ?  ?  ?  ? ?  ?AM-PAC OT "6 Clicks" Daily Activity     ?Outcome Measure Help from another person eating meals?: None ?Help from another person taking care of personal grooming?: A Little ?Help from another

## 2021-06-15 NOTE — Progress Notes (Addendum)
?PROGRESS NOTE ? ?[name] [name] NFA:[id number] DOB: 09-18-1950 DOA: 06/10/2021 ?PCP: [facility] ? ? LOS: 3 days  ? ?Brief Narrative / Interim history: ?71 year old female with history of HIV, CAD, pulmonary fibrosis with chronic respiratory failure, comes into the hospital with lightheadedness, weakness, dizziness progressively worse over the past week.  Symptoms are worse whenever she gets up and starts moving around.  She also been having nausea, vomiting and poor p.o. intake over the last week.  She reports that she saw couple blood clots in her emesis as well as some dried blood clots when blowing her nose.  She also have been having problems with home oxygen and her tanks ran out yesterday. ?-Orthostatics positive, hydrated with saline ?-Also noted to have E. coli UTI this admission ? ?Subjective / 24h Interval events: ?Feels better overall, no events overnight, declines rehab wants to go home with home health services ? ?Assessment and Plan: ? ? ?Orthostatic hypotension near syncope -Patient presents with complaints of weakness and reports of near syncope, possibly in the setting of UTI.  She denies passing out/loss of consciousness.  TSH unremarkable, given intermittent chronic steroids she underwent a stim test which did not show any evidence of adrenal insufficiency.  Hypotension improved but still has some orthostasis with elevated heart rate upon standing.  ?-Add midodrine, discontinue IV fluids today ?-Losartan and low-dose Toprol discontinued ?-Compression stockings ?-PT recommended SNF for short-term rehab, patient declines this, she will be discharged home with home health services instead ? ?CAD (coronary artery disease) -Patient had complained of some chest pain on admission ?- EKG without significant ischemic changes, troponin negative.  Prior history of PCI to the mid LAD in 12/2017 and left circumflex thrombolysis and 04/2020.  Has had recurrent chest pain on 4/3, very typical  of her prior MIs and cardiology consulted.  2D echo was done and did not show any wall motion abnormalities, normal EF.  Troponins have remained negative, no further work-up at this point per cardiology as it is felt to be part of her chronic angina.  Resume home Ranexa ? ?B12 deficiency-B12 levels low.  Supplement IM daily for 5 doses.  She reports numbness in her lower extremity as well as poor balance, suspect this contributes to her weakness but does not explain her hypotension ? ?Chronic respiratory failure with hypoxia ([place]) pulmonary fibrosis asthma -she is complaining of shortness of breath but feels somewhat at baseline.  She was hypoxic on room air but stable on 2-3 L at this time.  Continue to monitor.  Continue breathing treatments ? ?Questionable hematemesis-without recurrence.  Resume aspirin and Plavix ? ?Nausea & vomiting abdominal pain -stim test negative for adrenal insufficiency  ?-CT scan of the abdomen and pelvis fairly unremarkable.  ?-Symptoms possibly related to UTI or hypotension, improving with supportive care ? ?E. coli UTI -Prior to arrival.  Patient had been seen by Dr. Aundra Dubin of urology last week and culture reportedly had grown out E. coli, she was started on nitrofurantoin but after a dose or 2 stopped on her own because it was making her "sick".  She has been feeling progressively weak, possibly related to her untreated UTI.  Cultures here grew pansensitive E. coli, stop ceftriaxone and placed on Augmentin, continue current therapy to complete 5-day course of antibiotics ? ?Elevated AST (SGOT)-mild, monitor.  Unclear significance.  Not a drinker ? ?HIV (human immunodeficiency virus infection) (HCC)-Continue Dovato ? ?GERD (gastroesophageal reflux disease) -Patient with prior history of dysphagia with  schatizi ring status post dilation  home medication regimen includes Protonix 40 mg daily and reportedly states she was told not to take the omeprazole. ? ?Hyperlipidemia-continue  statin ? ?DVT proph: add lovenox ? ?CODE STATUS: Full code  ? ?family Communication: no family at bedside  ?Disposition: Home with home health services tomorrow ? ?Consultants:  ?None ? ?Procedures:  ?none ? ?Microbiology  ?none ? ?Antimicrobials: ?none  ? ? ?Objective: ?Vitals:  ? 06/14/21 2058 06/15/21 0507 06/15/21 0815 06/15/21 5053  ?BP:  116/66  105/66  ?Pulse:  69 81 78  ?Resp:  '16 18 18  '$ ?Temp:  97.9 ?F (36.6 ?C)  98.3 ?F (36.8 ?C)  ?TempSrc:  Oral  Oral  ?SpO2: 96% 100% 98%   ?Weight:      ?Height:      ? ? ?Intake/Output Summary (Last 24 hours) at 06/15/2021 1356 ?Last data filed at 06/15/2021 0900 ?Gross per 24 hour  ?Intake 1533.8 ml  ?Output 1000 ml  ?Net 533.8 ml  ? ?Wt Readings from Last 3 Encounters:  ?06/12/21 67.9 kg  ?08/27/19 65.5 kg  ?07/05/19 63.5 kg  ? ? ?Examination: ? ?Gen: Pleasant lady sitting up in bed, appears older than stated age awake, Alert, Oriented X 3 no distress ?HEENT: no JVD ?Lungs: Good air movement bilaterally, CTAB ?CVS: S1S2/RRR ?Abd: soft, Non tender, non distended, BS present ?Extremities: No edema ?Skin: no new rashes on exposed skin  ? ? ?Data Reviewed: I have independently reviewed following labs and imaging studies  ? ?CBC ?Recent Labs  ?Lab 06/10/21 ?1903 06/11/21 ?0840 06/12/21 ?9767 06/13/21 ?0040 06/15/21 ?3419  ?WBC 7.2 7.8 6.4 6.8 5.2  ?HGB 13.6 12.9 11.0* 12.0 11.0*  ?HCT 42.7 39.5 33.1* 36.8 33.2*  ?PLT 232 229 169 196 165  ?MCV 100.9* 100.3* 100.3* 100.5* 98.8  ?MCH 32.2 32.7 33.3 32.8 32.7  ?MCHC 31.9 32.7 33.2 32.6 33.1  ?RDW 15.8* 15.7* 15.8* 15.9* 16.2*  ?LYMPHSABS 1.2  --  1.4  --   --   ?MONOABS 1.1*  --  1.0  --   --   ?EOSABS 0.0  --  0.1  --   --   ?BASOSABS 0.0  --  0.0  --   --   ? ? ?Recent Labs  ?Lab 06/10/21 ?1903 06/11/21 ?0840 06/12/21 ?3790 06/12/21 ?2409 06/13/21 ?0040 06/15/21 ?0452  ?NA 139 139 143  --  141 141  ?K 4.0 3.4* 3.5  --  3.6 3.5  ?CL 108 112* 116*  --  113* 111  ?CO2 20* 17* 20*  --  22 23  ?GLUCOSE 106* 83 84  --  88 92  ?BUN 12  13 6*  --  <5* 5*  ?CREATININE 0.70 0.73 0.64  --  0.66 0.62  ?CALCIUM 8.9 8.3* 8.0*  --  8.4* 8.3*  ?AST 73* 67* 56*  --  56*  --   ?ALT '29 24 23  '$ --  25  --   ?ALKPHOS 60 49 44  --  50  --   ?BILITOT 0.8 1.0 0.9  --  0.7  --   ?ALBUMIN 3.4* 2.9* 2.5*  --  2.8*  --   ?PROCALCITON  --  <0.10  --   --   --   --   ?LATICACIDVEN  --   --  0.9 1.8  --   --   ?TSH  --  2.028  --   --   --   --   ? ? ?------------------------------------------------------------------------------------------------------------------ ?  No results for input(s): CHOL, HDL, LDLCALC, TRIG, CHOLHDL, LDLDIRECT in the last 72 hours. ? ?No results found for: HGBA1C ?------------------------------------------------------------------------------------------------------------------ ?No results for input(s): TSH, T4TOTAL, T3FREE, THYROIDAB in the last 72 hours. ? ?Invalid input(s): FREET3 ? ? ?Cardiac Enzymes ?No results for input(s): CKMB, TROPONINI, MYOGLOBIN in the last 168 hours. ? ?Invalid input(s): CK ?------------------------------------------------------------------------------------------------------------------ ?   ?Component Value Date/Time  ? BNP 131.4 (H) 04/23/2018 1044  ? ? ?CBG: ?No results for input(s): GLUCAP in the last 168 hours. ? ?Recent Results (from the past 240 hour(s))  ?Resp Panel by RT-PCR (Flu A&B, Covid) Nasopharyngeal Swab     Status: None  ? Collection Time: 06/10/21  7:03 PM  ? Specimen: Nasopharyngeal Swab; Nasopharyngeal(NP) swabs in vial transport medium  ?Result Value Ref Range Status  ? SARS Coronavirus 2 by RT PCR NEGATIVE NEGATIVE Final  ?  Comment: (NOTE) ?SARS-CoV-2 target nucleic acids are NOT DETECTED. ? ?The SARS-CoV-2 RNA is generally detectable in upper respiratory ?specimens during the acute phase of infection. The lowest ?concentration of SARS-CoV-2 viral copies this assay can detect is ?138 copies/mL. A negative result does not preclude SARS-Cov-2 ?infection and should not be used as the sole basis for  treatment or ?other patient management decisions. A negative result may occur with  ?improper specimen collection/handling, submission of specimen other ?than nasopharyngeal swab, presence of viral mutation(s

## 2021-06-15 NOTE — Care Management Important Message (Signed)
Important Message ? ?Patient Details  ?Name: [name] [name] ?MRN: [id number] ?Date of Birth: [birth year] ? ? ?Medicare Important Message Given:  Yes ? ? ? ? ?Treon Kehl ?06/15/2021, 3:50 PM ?

## 2021-06-16 LAB — BASIC METABOLIC PANEL
Anion gap: 6 (ref 5–15)
BUN: 8 mg/dL (ref 8–23)
CO2: 22 mmol/L (ref 22–32)
Calcium: 8.2 mg/dL — ABNORMAL LOW (ref 8.9–10.3)
Chloride: 113 mmol/L — ABNORMAL HIGH (ref 98–111)
Creatinine, Ser: 0.65 mg/dL (ref 0.44–1.00)
GFR, Estimated: >60 mL/min (ref >60)
Glucose, Bld: 104 mg/dL — ABNORMAL HIGH (ref 70–99)
Potassium: 3.6 mmol/L (ref 3.5–5.1)
Sodium: 141 mmol/L (ref 135–145)

## 2021-06-16 NOTE — Progress Notes (Signed)
?PROGRESS NOTE ? ?[name] [name]:[id number] DOB: 30-May-1950 DOA: 06/10/2021 ?PCP: [facility] ? ? LOS: 4 days  ? ?Brief Narrative / Interim history: ?71 year old female with history of HIV, CAD, pulmonary fibrosis with chronic respiratory failure, comes into the hospital with lightheadedness, weakness, dizziness progressively worse over the past week.  Symptoms are worse whenever she gets up and starts moving around.  She also been having nausea, vomiting and poor p.o. intake over the last week.  She reports that she saw couple blood clots in her emesis as well as some dried blood clots when blowing her nose.  She also have been having problems with home oxygen and her tanks ran out yesterday. ?-Orthostatics positive, hydrated with saline ?-Also noted to have E. coli UTI this admission ? ?Subjective / 24h Interval events: ?Feels better overall, no events overnight, declines rehab wants to go home with home health services ? ?Assessment and Plan: ? ? ?Orthostatic hypotension near syncope -Patient presents with complaints of weakness and reports of near syncope, possibly in the setting of UTI.  She denies passing out/loss of consciousness.  TSH unremarkable, given intermittent chronic steroids she underwent a stim test which did not show any evidence of adrenal insufficiency.  Hypotension improved but still has some orthostasis with elevated heart rate upon standing.  ?-IV fluids discontinued, started on midodrine yesterday ?-Losartan and low-dose Toprol discontinued ?-Continue compression stockings ?-PT recommended SNF for short-term rehab, BP dropped from 325 systolic to 75 systolic on standing, patient declines short-term rehab, plan for discharge home with home health services  ?-Recheck orthostatics  ? ?CAD (coronary artery disease) -Patient had complained of some chest pain on admission ?- EKG without significant ischemic changes, troponin negative.  Prior history of PCI to the mid LAD in  12/2017 and left circumflex thrombolysis and 04/2020.  Has had recurrent chest pain on 4/3, very typical of her prior MIs and cardiology consulted.  2D echo was done and did not show any wall motion abnormalities, normal EF.  Troponins have remained negative, no further work-up at this point per cardiology as it is felt to be part of her chronic angina.  Resume home Ranexa ? ?B12 deficiency-B12 levels low.  Supplement IM daily for 5 doses.  She reports numbness in her lower extremity as well as poor balance, suspect this contributes to her weakness but does not explain her hypotension ? ?Chronic respiratory failure with hypoxia ([place]) pulmonary fibrosis asthma -she is complaining of shortness of breath but feels somewhat at baseline.  She was hypoxic on room air but stable on 2-3 L at this time.  Continue to monitor.  Continue breathing treatments ? ?Questionable hematemesis-without recurrence.  Continue aspirin, Plavix ? ?Nausea & vomiting abdominal pain -stim test negative for adrenal insufficiency  ?-CT scan of the abdomen and pelvis fairly unremarkable.  ?-Symptoms possibly related to UTI or hypotension, improving with supportive care ? ?E. coli UTI -Prior to arrival.  Patient had been seen by Dr. Aundra Dubin of urology last week and culture reportedly had grown out E. coli, she was started on nitrofurantoin but after a dose or 2 stopped on her own because it was making her "sick".  She has been feeling progressively weak, possibly related to her untreated UTI.  Cultures here grew pansensitive E. coli, stop ceftriaxone and placed on Augmentin, continue current therapy to complete 5-day course of antibiotics ? ?Elevated AST (SGOT)-mild, monitor.  Unclear significance.  Not a drinker ? ?HIV (human immunodeficiency virus infection) (  HCC)-Continue Dovato ? ?GERD (gastroesophageal reflux disease) -Patient with prior history of dysphagia with schatizi ring status post dilation  home medication regimen includes Protonix  40 mg daily and reportedly states she was told not to take the omeprazole. ? ?Hyperlipidemia-continue statin ? ?DVT proph: Lovenox ? ?CODE STATUS: Full code  ? ?family Communication: no family at bedside  ?Disposition: Home with home health services tomorrow if stable ? ?Consultants:  ?None ? ?Procedures:  ?none ? ?Microbiology  ?none ? ?Antimicrobials: ?none  ? ? ?Objective: ?Vitals:  ? 06/16/21 0855 06/16/21 0900 06/16/21 0905 06/16/21 0908  ?BP: 107/64 117/72 102/84   ?Pulse: 74   75  ?Resp: 18   18  ?Temp: 98.5 ?F (36.9 ?C)     ?TempSrc: Oral     ?SpO2: 98%   98%  ?Weight:      ?Height:      ? ? ?Intake/Output Summary (Last 24 hours) at 06/16/2021 1044 ?Last data filed at 06/16/2021 0855 ?Gross per 24 hour  ?Intake 600 ml  ?Output 600 ml  ?Net 0 ml  ? ?Wt Readings from Last 3 Encounters:  ?06/12/21 67.9 kg  ?08/27/19 65.5 kg  ?07/05/19 63.5 kg  ? ? ?Examination: ? ?Gen: Pleasant elderly female sitting up in bed, AAOx3, no distress ?HEENT: No JVD ?Lungs: Fine crackles bilaterally ?Abdomen: Soft, nontender, bowel sounds present ?Extremities: No edema  ?Skin: no new rashes on exposed skin  ? ? ?Data Reviewed: I have independently reviewed following labs and imaging studies  ? ?CBC ?Recent Labs  ?Lab 06/10/21 ?1903 06/11/21 ?0840 06/12/21 ?6811 06/13/21 ?0040 06/15/21 ?5726  ?WBC 7.2 7.8 6.4 6.8 5.2  ?HGB 13.6 12.9 11.0* 12.0 11.0*  ?HCT 42.7 39.5 33.1* 36.8 33.2*  ?PLT 232 229 169 196 165  ?MCV 100.9* 100.3* 100.3* 100.5* 98.8  ?MCH 32.2 32.7 33.3 32.8 32.7  ?MCHC 31.9 32.7 33.2 32.6 33.1  ?RDW 15.8* 15.7* 15.8* 15.9* 16.2*  ?LYMPHSABS 1.2  --  1.4  --   --   ?MONOABS 1.1*  --  1.0  --   --   ?EOSABS 0.0  --  0.1  --   --   ?BASOSABS 0.0  --  0.0  --   --   ? ? ?Recent Labs  ?Lab 06/10/21 ?1903 06/11/21 ?0840 06/12/21 ?2035 06/12/21 ?5974 06/13/21 ?0040 06/15/21 ?1638 06/16/21 ?0455  ?NA 139 139 143  --  141 141 141  ?K 4.0 3.4* 3.5  --  3.6 3.5 3.6  ?CL 108 112* 116*  --  113* 111 113*  ?CO2 20* 17* 20*  --  '22 23 22   '$ ?GLUCOSE 106* 83 84  --  88 92 104*  ?BUN 12 13 6*  --  <5* 5* 8  ?CREATININE 0.70 0.73 0.64  --  0.66 0.62 0.65  ?CALCIUM 8.9 8.3* 8.0*  --  8.4* 8.3* 8.2*  ?AST 73* 67* 56*  --  56*  --   --   ?ALT '29 24 23  '$ --  25  --   --   ?ALKPHOS 60 49 44  --  50  --   --   ?BILITOT 0.8 1.0 0.9  --  0.7  --   --   ?ALBUMIN 3.4* 2.9* 2.5*  --  2.8*  --   --   ?PROCALCITON  --  <0.10  --   --   --   --   --   ?LATICACIDVEN  --   --  0.9 1.8  --   --   --   ?  TSH  --  2.028  --   --   --   --   --   ? ? ?------------------------------------------------------------------------------------------------------------------ ?No results for input(s): CHOL, HDL, LDLCALC, TRIG, CHOLHDL, LDLDIRECT in the last 72 hours. ? ?No results found for: HGBA1C ?------------------------------------------------------------------------------------------------------------------ ?No results for input(s): TSH, T4TOTAL, T3FREE, THYROIDAB in the last 72 hours. ? ?Invalid input(s): FREET3 ? ? ?Cardiac Enzymes ?No results for input(s): CKMB, TROPONINI, MYOGLOBIN in the last 168 hours. ? ?Invalid input(s): CK ?------------------------------------------------------------------------------------------------------------------ ?   ?Component Value Date/Time  ? BNP 131.4 (H) 04/23/2018 1044  ? ? ?CBG: ?No results for input(s): GLUCAP in the last 168 hours. ? ?Recent Results (from the past 240 hour(s))  ?Resp Panel by RT-PCR (Flu A&B, Covid) Nasopharyngeal Swab     Status: None  ? Collection Time: 06/10/21  7:03 PM  ? Specimen: Nasopharyngeal Swab; Nasopharyngeal(NP) swabs in vial transport medium  ?Result Value Ref Range Status  ? SARS Coronavirus 2 by RT PCR NEGATIVE NEGATIVE Final  ?  Comment: (NOTE) ?SARS-CoV-2 target nucleic acids are NOT DETECTED. ? ?The SARS-CoV-2 RNA is generally detectable in upper respiratory ?specimens during the acute phase of infection. The lowest ?concentration of SARS-CoV-2 viral copies this assay can detect is ?138 copies/mL. A  negative result does not preclude SARS-Cov-2 ?infection and should not be used as the sole basis for treatment or ?other patient management decisions. A negative result may occur with  ?improper specimen collec

## 2021-06-16 NOTE — Plan of Care (Signed)
  Problem: Pain Managment: Goal: General experience of comfort will improve Outcome: Progressing   

## 2021-06-16 NOTE — Progress Notes (Signed)
Physical Therapy Treatment ?Patient Details ?Name: [name] [name] ?MRN: [id number] ?DOB: May 10, 1950 ?Today's Date: 06/16/2021 ? ? ?History of Present Illness 71 year old female, comes into the hospital with lightheadedness, weakness, dizziness progressively worse over the past week.  Symptoms are worse whenever she gets up and starts moving around.  She also been having nausea, vomiting and poor p.o. intake over the last week.  She reports that she saw couple blood clots in her emesis as well as some dried blood clots when blowing her nose.  She also have been having problems with home oxygen and her tanks ran the day before admission; with history of HIV, CAD, pulmonary fibrosis with chronic respiratory failure ? ?  ?PT Comments  ? ? Session focused on rollator training and education. Patient with good comprehension and demonstrated of use of rollator and brakes prior to standing and sitting onto rollator. Monitored for syncopal symptoms but none reported this date. Patient complaining of LE fatigue after short distance. Updated recommendation to HHPT at discharge. Patient states case management is organizing ambulance transport to avoid stair negotiation into home.  ? ?Orthostatic BPs ? ?Supine 140/80   ?Sitting 129/84  ?Standing 117/87  ?Standing after 3 min 114/80  ? ?   ?Recommendations for follow up therapy are one component of a multi-disciplinary discharge planning process, led by the attending physician.  Recommendations may be updated based on patient status, additional functional criteria and insurance authorization. ? ?Follow Up Recommendations ? Home health PT ?  ?  ?Assistance Recommended at Discharge Intermittent Supervision/Assistance  ?Patient can return home with the following Help with stairs or ramp for entrance;A little help with walking and/or transfers ?  ?Equipment Recommendations ? Rollator (4 wheels);BSC/3in1  ?  ?Recommendations for Other Services   ? ? ?  ?Precautions / Restrictions  Precautions ?Precautions: Fall ?Precaution Comments: watch for dizziness; hypotension/tachycardia with upright activity ?Required Braces or Orthoses: Other Brace ?Other Brace: Have been using an abdominal binder and ted hose for the past few sessions ?Restrictions ?Weight Bearing Restrictions: No  ?  ? ?Mobility ? Bed Mobility ?Overal bed mobility: Needs Assistance ?Bed Mobility: Supine to Sit, Sit to Supine ?  ?  ?Supine to sit: Supervision ?Sit to supine: Supervision ?  ?General bed mobility comments: Supervision for safety ?  ? ?Transfers ?Overall transfer level: Needs assistance ?Equipment used: Rollator (4 wheels) ?Transfers: Sit to/from Stand ?Sit to Stand: Supervision ?  ?  ?  ?  ?  ?General transfer comment: cues for rollator management and braking prior to standing ?  ? ?Ambulation/Gait ?Ambulation/Gait assistance: Supervision ?Gait Distance (Feet): 30 Feet ?Assistive device: Rollator (4 wheels) ?Gait Pattern/deviations: Step-through pattern ?Gait velocity: decreased ?  ?  ?General Gait Details: Monitored syncopal symptoms during session with patient reporting no symptoms other than LE fatigue. Instructed patient on locking brakes prior to turning and sitting on rollator. Patient performed this with supervision for safety. ? ? ?Stairs ?  ?  ?  ?  ?  ? ? ?Wheelchair Mobility ?  ? ?Modified Rankin (Stroke Patients Only) ?  ? ? ?  ?Balance Overall balance assessment: Mild deficits observed, not formally tested ?  ?  ?  ?  ?  ?  ?  ?  ?  ?  ?  ?  ?  ?  ?  ?  ?  ?  ?  ? ?  ?Cognition Arousal/Alertness: Awake/alert ?Behavior During Therapy: [facility] for tasks assessed/performed ?Overall Cognitive Status: Within Functional Limits for tasks  assessed ?  ?  ?  ?  ?  ?  ?  ?  ?  ?  ?  ?  ?  ?  ?  ?  ?  ?  ?  ? ?  ?Exercises   ? ?  ?General Comments   ?  ?  ? ?Pertinent Vitals/Pain Pain Assessment ?Pain Assessment: No/denies pain  ? ? ?Home Living   ?  ?  ?  ?  ?  ?  ?  ?  ?  ?   ?  ?Prior Function    ?  ?  ?   ? ?PT Goals  (current goals can now be found in the care plan section) Acute Rehab PT Goals ?PT Goal Formulation: With patient ?Time For Goal Achievement: 06/26/21 ?Potential to Achieve Goals: Good ?Progress towards PT goals: Progressing toward goals ? ?  ?Frequency ? ? ? Min 3X/week ? ? ? ?  ?PT Plan Current plan remains appropriate  ? ? ?Co-evaluation   ?  ?  ?  ?  ? ?  ?AM-PAC PT "6 Clicks" Mobility   ?Outcome Measure ? Help needed turning from your back to your side while in a flat bed without using bedrails?: None ?Help needed moving from lying on your back to sitting on the side of a flat bed without using bedrails?: None ?Help needed moving to and from a bed to a chair (including a wheelchair)?: A Little ?Help needed standing up from a chair using your arms (e.g., wheelchair or bedside chair)?: A Little ?Help needed to walk in hospital room?: A Little ?Help needed climbing 3-5 steps with a railing? : Total ?6 Click Score: 18 ? ?  ?End of Session Equipment Utilized During Treatment: Gait belt;Oxygen ?Activity Tolerance: Patient tolerated treatment well ?Patient left: in bed;with call bell/phone within reach;with bed alarm set ?Nurse Communication: Mobility status ?PT Visit Diagnosis: Unsteadiness on feet (R26.81);Other abnormalities of gait and mobility (R26.89) ?  ? ? ?Time: 2831-5176 ?PT Time Calculation (min) (ACUTE ONLY): 27 min ? ?Charges:  $Gait Training: 23-37 mins          ?          ? ?Taylon Coole A. Gilford Rile, PT, DPT ?Acute Rehabilitation Services ?Pager [phone] ?Office [phone] ? ? ? ?Kerney Hopfensperger A Elara Cocke ?06/16/2021, 5:09 PM ? ?

## 2021-06-17 MED ORDER — ONDANSETRON HCL 4 MG PO TABS: 4.0000 mg | ORAL_TABLET | Freq: Three times a day (TID) | ORAL | 0 refills | Status: DC | PRN

## 2021-06-17 MED ORDER — MIDODRINE HCL 10 MG PO TABS: 10.0000 mg | ORAL_TABLET | Freq: Two times a day (BID) | ORAL | 0 refills | Status: AC

## 2021-06-17 NOTE — Progress Notes (Signed)
Occupational Therapy Treatment ?Patient Details ?Name: [name] [name] ?MRN: [id number] ?DOB: 09/24/1950 ?Today's Date: 06/17/2021 ? ? ?History of present illness 71 year old female, comes into the hospital with lightheadedness, weakness, dizziness progressively worse over the past week.  Symptoms are worse whenever she gets up and starts moving around.  She also been having nausea, vomiting and poor p.o. intake over the last week.  She reports that she saw couple blood clots in her emesis as well as some dried blood clots when blowing her nose.  She also have been having problems with home oxygen and her tanks ran the day before admission; with history of HIV, CAD, pulmonary fibrosis with chronic respiratory failure ?  ?OT comments ? Pt completes selfcare tasks sit to stand with use of the RW for support at supervision level.  Educated on need for a tub bench at home as well for energy conservation.  She will purchase from outside vendor.  Still with a drop in BP with standing at 90/57 but pt was not symptomatic, even with knee high TEDs and abdominal binder in place.  Feel pt will be OK to discharge home with her daughter and grandson with continued HHOT recommended.    ? ?Recommendations for follow up therapy are one component of a multi-disciplinary discharge planning process, led by the attending physician.  Recommendations may be updated based on patient status, additional functional criteria and insurance authorization. ?   ?Follow Up Recommendations ? Home health OT  ?  ?Assistance Recommended at Discharge Intermittent Supervision/Assistance  ?Patient can return home with the following ? A little help with bathing/dressing/bathroom;Assistance with cooking/housework;Assist for transportation;Help with stairs or ramp for entrance;A little help with walking and/or transfers ?  ?Equipment Recommendations ? Other (comment);Tub/shower bench (will purchase outside of hospital)  ?  ?   ?Precautions / Restrictions  Precautions ?Precautions: Fall ?Required Braces or Orthoses: Other Brace ?Other Brace: Have been using an abdominal binder and ted hose for the past few sessions ?Restrictions ?Weight Bearing Restrictions: No  ? ? ?  ? ?Mobility Bed Mobility ?Overal bed mobility: Needs Assistance ?  ?  ?  ?Supine to sit: Supervision ?Sit to supine: Supervision ?  ?  ?  ? ?Transfers ?Overall transfer level: Needs assistance ?Equipment used: Rolling walker (2 wheels) ?Transfers: Sit to/from Stand ?Sit to Stand: Supervision ?  ?  ?Step pivot transfers: Supervision ?  ?  ?  ?  ?  ?Balance Overall balance assessment: Needs assistance ?Sitting-balance support: No upper extremity supported ?Sitting balance-Leahy Scale: Good ?  ?  ?Standing balance support: During functional activity, Reliant on assistive device for balance ?Standing balance-Leahy Scale: Fair ?  ?  ?  ?  ?  ?  ?  ?  ?  ?  ?  ?  ?   ? ?ADL either performed or assessed with clinical judgement  ? ?ADL Overall ADL's : Needs assistance/impaired ?Eating/Feeding: Independent ?  ?Grooming: Wash/dry hands;Wash/dry face;Oral care;Supervision/safety;Standing ?  ?  ?  ?  ?  ?  ?  ?  ?  ?Toilet Transfer: Supervision/safety;Rolling walker (2 wheels) ?  ?Toileting- Clothing Manipulation and Hygiene: Supervision/safety ?Toileting - Clothing Manipulation Details (indicate cue type and reason): simulated sit to stand as pt denied needing to go to the bathroom. ?  ?  ?  ?General ADL Comments: Pt with BP in supine at 115/62 and then in sitting without the binder but with knee high TEDs in place at 110/78.  Binder placed and standing sats  at 90/57 but pt asymptomatic by report.  HR remained 68-75 BPM with O2 sats on 2Ls at 98%.  Educated pt on energy conservation strategy of using a tub bench at discharge for bathing and discussed where these can be purchased as insurance usually does not cover.  She states that she has a card that may help pay for it and will discuss with her daughter. ?   ? ? ?   ?   ?   ? ?Cognition Arousal/Alertness: Awake/alert ?Behavior During Therapy: [facility] for tasks assessed/performed ?Overall Cognitive Status: Within Functional Limits for tasks assessed ?  ?  ?  ?  ?  ?  ?  ?  ?  ?  ?  ?  ?  ?  ?  ?  ?  ?  ?  ?   ?   ?   ?   ? ? ?Pertinent Vitals/ Pain       Pain Assessment ?Pain Assessment: No/denies pain ? ?   ?   ? ?Frequency ? Min 2X/week  ? ? ? ? ?  ?Progress Toward Goals ? ?OT Goals(current goals can now be found in the care plan section) ? Progress towards OT goals: Progressing toward goals ? ?Acute Rehab OT Goals ?Patient Stated Goal: To go home today ?OT Goal Formulation: With patient ?Time For Goal Achievement: 06/29/21 ?Potential to Achieve Goals: Good  ?Plan Discharge plan remains appropriate   ? ?   ?AM-PAC OT "6 Clicks" Daily Activity     ?Outcome Measure ? ? Help from another person eating meals?: None ?Help from another person taking care of personal grooming?: A Little ?Help from another person toileting, which includes using toliet, bedpan, or urinal?: A Little ?Help from another person bathing (including washing, rinsing, drying)?: A Little ?Help from another person to put on and taking off regular upper body clothing?: A Little ?Help from another person to put on and taking off regular lower body clothing?: A Little ?6 Click Score: 19 ? ?  ?End of Session Equipment Utilized During Treatment: Rolling walker (2 wheels);Oxygen ? ?OT Visit Diagnosis: Unsteadiness on feet (R26.81);Other abnormalities of gait and mobility (R26.89);Muscle weakness (generalized) (M62.81) ?  ?Activity Tolerance Patient tolerated treatment well ?  ?Patient Left in bed;with call bell/phone within reach;with nursing/sitter in room ?  ?Nurse Communication Mobility status ?  ? ?   ? ?Time: 0932-6712 ?OT Time Calculation (min): 35 min ? ?Charges: OT General Charges ?$OT Visit: 1 Visit ?OT Treatments ?$Self Care/Home Management : 23-37 mins ? ? ?Taryll Reichenberger OTR/L ?06/17/2021, 1:05 PM ?

## 2021-06-17 NOTE — Discharge Summary (Addendum)
Physician Discharge Summary  ?[name] [name] XHB:[id number] DOB: [birth year] DOA: 06/10/2021 ? ?PCP: [facility] ? ?Admit date: 06/10/2021 ?Discharge date: 06/17/2021 ? ?Time spent: 35 minutes ? ?Recommendations for Outpatient Follow-up:  ?PCP in 1 week ?Home health PT/OT ?Losartan and metoprolol discontinued on account of severe orthostatic hypotension ? ? ?Discharge Diagnoses:  ?Principal Problem: ?  Orthostatic hypotension near syncope ?Active Problems: ?  Near syncope ?  Nausea & vomiting abdominal pain ?  Chronic respiratory failure with hypoxia (HCC) pulmonary fibrosis asthma ?  CAD (coronary artery disease) ?  E. coli UTI ?  GERD (gastroesophageal reflux disease) ?  IPF (idiopathic pulmonary fibrosis) ([place]) ?  HIV (human immunodeficiency virus infection) ([place]) ?  Abdominal pain ?  Elevated AST (SGOT) ?  Vitamin B12 deficiency ?  Macrocytic anemia ?  Hypotension ? ? ?Discharge Condition: stable ? ?Diet recommendation: low sodium ? ?Filed Weights  ? 06/10/21 1814 06/12/21 1129  ?Weight: 62.6 kg 67.9 kg  ? ? ?History of present illness:  ?71 year old female with history of HIV, CAD, pulmonary fibrosis with chronic respiratory failure, comes into the hospital with lightheadedness, weakness, dizziness progressively worse over the past week.  Symptoms are worse whenever she gets up and starts moving around.  She also been having nausea, vomiting and poor p.o. intake over the last week.  She reports that she saw couple blood clots in her emesis as well as some dried blood clots when blowing her nose.  She also have been having problems with home oxygen and her tanks ran out yesterday. ?-Orthostatics positive, hydrated with saline ?-Also noted to have E. coli UTI this admission ? ?Hospital Course:  ? ?Orthostatic hypotension near syncope -Patient presents with complaints of weakness and reports of near syncope, possibly in the setting of UTI.  She denies passing out/loss of consciousness.  TSH  unremarkable, given intermittent chronic steroids she underwent a stim test which did not show any evidence of adrenal insufficiency.  Hypotension improved but still has some orthostasis with elevated heart rate upon standing.  ?-IV fluids discontinued, started on midodrine ?-Losartan and low-dose Toprol discontinued ?-Continue compression stockings ?-PT recommended SNF for short-term rehab, patient declines short-term rehab, plan for discharge home with home health services  ?  ?CAD (coronary artery disease) -Patient had complained of some chest pain on admission ?- EKG without significant ischemic changes, troponin negative.  Prior history of PCI to the mid LAD in 12/2017 and left circumflex thrombolysis and 04/2020.  Has had recurrent chest pain on 4/3, very typical of her prior MIs and cardiology consulted.  2D echo was done and did not show any wall motion abnormalities, normal EF.  Troponins have remained negative, no further work-up at this point per cardiology as it is felt to be part of her chronic angina.  Resumed home Ranexa ?  ?B12 deficiency-B12 levels low.  Supplement IM daily for 5 doses.  She reports numbness in her lower extremity as well as poor balance, suspect this contributes to her weakness but does not explain her hypotension ?  ?Chronic respiratory failure with hypoxia (HCC) pulmonary fibrosis ?-Stable at baseline, continue home O2  ?  ?Questionable hematemesis-without recurrence.  Continue aspirin, Plavix ?  ?Nausea & vomiting abdominal pain -stim test negative for adrenal insufficiency  ?-CT scan of the abdomen and pelvis fairly unremarkable.  ?-Symptoms possibly related to UTI or hypotension, improving with supportive care ?  ?E. coli UTI -Prior to arrival.  Patient had been seen by  Dr. Aundra Dubin of urology last week and culture reportedly had grown out E. coli, she was started on nitrofurantoin but after a dose or 2 stopped on her own because it was making her "sick".  She has been  feeling progressively weak, possibly related to her untreated UTI.  Cultures here grew pansensitive E. coli, stop ceftriaxone and placed on Augmentin, continue current therapy to complete 5-day course of antibiotics ?  ?Elevated AST (SGOT)-mild, monitor.  Unclear significance.  Not a drinker ?  ?HIV (human immunodeficiency virus infection) (HCC)-Continue Dovato ?  ?GERD (gastroesophageal reflux disease) -Patient with prior history of dysphagia with schatizi ring status post dilation  home medication regimen includes Protonix 40 mg daily and reportedly states she was told not to take the omeprazole. ?  ?Hyperlipidemia-continue statin ?  ? ?Discharge Exam: ?Vitals:  ? 06/17/21 0753 06/17/21 0900  ?BP:  119/63  ?Pulse: 76 68  ?Resp: 18 17  ?Temp:  98.3 ?F (36.8 ?C)  ?SpO2: 99% 99%  ? ?Gen: Pleasant elderly female sitting up in bed, AAOx3, no distress ?HEENT: No JVD ?Lungs: Fine crackles bilaterally ?Abdomen: Soft, nontender, bowel sounds present ?Extremities: No edema  ?Skin: no new rashes on exposed skin  ? ? ?Discharge Instructions ? ? ?Discharge Instructions   ? ? Diet - low sodium heart healthy   Complete by: As directed ?  ? Increase activity slowly   Complete by: As directed ?  ? ?  ? ?Allergies as of 06/17/2021   ? ?   Reactions  ? Isosorbide Nitrate Other (See Comments)  ? Headaches  ? Sulfamethoxazole-trimethoprim Hives, Rash  ? ?  ? ?  ?Medication List  ?  ? ?STOP taking these medications   ? ?losartan 25 MG tablet ?Commonly known as: COZAAR ?  ?metoprolol succinate 25 MG 24 hr tablet ?Commonly known as: TOPROL-XL ?  ?nitrofurantoin (macrocrystal-monohydrate) 100 MG capsule ?Commonly known as: MACROBID ?  ? ?  ? ?TAKE these medications   ? ?albuterol 108 (90 Base) MCG/ACT inhaler ?Commonly known as: VENTOLIN HFA ?Inhale 1-2 puffs into the lungs every 6 (six) hours as needed for wheezing or shortness of breath. ?  ?aspirin EC 81 MG tablet ?Take 1 tablet (81 mg total) by mouth daily. ?  ?atorvastatin 80 MG  tablet ?Commonly known as: LIPITOR ?Take 80 mg by mouth daily at 6 PM. ?  ?budesonide-formoterol 160-4.5 MCG/ACT inhaler ?Commonly known as: SYMBICORT ?Inhale 2 puffs into the lungs 2 (two) times daily. ?  ?clopidogrel 75 MG tablet ?Commonly known as: PLAVIX ?Take 75 mg by mouth daily. ?  ?conjugated estrogens 0.625 MG/GM vaginal cream ?Commonly known as: PREMARIN ?Place 8.29 Applicatorfuls vaginally every Wednesday. Pt only to use 1/4 of the applicator ?  ?diclofenac Sodium 1 % Gel ?Commonly known as: VOLTAREN ?Apply 2-4 g topically 2 (two) times daily as needed (joint pain). ?  ?dolutegravir-lamiVUDine 50-300 MG tablet ?Commonly known as: DOVATO ?Take 1 tablet by mouth daily. ?  ?furosemide 20 MG tablet ?Commonly known as: LASIX ?Take 20 mg by mouth daily as needed for fluid. ?  ?lidocaine 2 % solution ?Commonly known as: XYLOCAINE ?Use as directed 15 mLs in the mouth or throat every 6 (six) hours as needed (sore throat). ?  ?midodrine 10 MG tablet ?Commonly known as: PROAMATINE ?Take 1 tablet (10 mg total) by mouth 2 (two) times daily with a meal. ?  ?nitroGLYCERIN 0.4 MG SL tablet ?Commonly known as: NITROSTAT ?Place 1 tablet (0.4 mg total) under the tongue every  5 (five) minutes as needed for chest pain. ?  ?omeprazole 40 MG capsule ?Commonly known as: PRILOSEC ?Take 1 capsule (40 mg total) by mouth 2 (two) times daily. ?  ?ondansetron 4 MG tablet ?Commonly known as: Zofran ?Take 1 tablet (4 mg total) by mouth every 8 (eight) hours as needed for nausea or vomiting. ?  ?pantoprazole 40 MG tablet ?Commonly known as: PROTONIX ?Take 40 mg by mouth daily. ?  ?polyethylene glycol 17 g packet ?Commonly known as: MIRALAX / GLYCOLAX ?Take 17 g by mouth daily as needed (constipation). ?  ?ranolazine 500 MG 12 hr tablet ?Commonly known as: RANEXA ?Take 500 mg by mouth 2 (two) times daily. ?  ?traZODone 50 MG tablet ?Commonly known as: DESYREL ?Take 50 mg by mouth at bedtime. ?  ?trospium 20 MG tablet ?Commonly known as:  SANCTURA ?Take 20 mg by mouth 2 (two) times daily. ?  ? ?  ? ?Allergies  ?Allergen Reactions  ? Isosorbide Nitrate Other (See Comments)  ?  Headaches  ? Sulfamethoxazole-Trimethoprim Hives and Rash  ? ? Follow-up Information

## 2021-06-17 NOTE — TOC Transition Note (Signed)
Transition of Care (TOC) - CM/SW Discharge Note ? ? ?Patient Details  ?Name: ISATU MACINNES ?MRN: [id number] ?Date of Birth: [birth year] ? ?Transition of Care (TOC) CM/SW Contact:  ?Coralee Pesa, LCSWA ?Phone Number: ?06/17/2021, 12:01 PM ? ? ?Clinical Narrative:    ?Pt to be transported home via [facility]. Address confirmed with pt. ?Pt has spare tank at bedside, she will need to order her travel tank directly from the supplier. ? ? ?Final next level of care: [place] ?Barriers to Discharge: Barriers Resolved ? ? ?Patient Goals and CMS Choice ?Patient states their goals for this hospitalization and ongoing recovery are:: To return home ?CMS Medicare.gov Compare Post Acute Care list provided to:: Patient ?Choice offered to / list presented to : Patient ? ?Discharge Placement ?  ?           ?Patient [facility] bed at:  [facility]) ?Patient to be transferred to facility by: [facility] ?Name of family member notified: Patient ?Patient and family notified of of transfer: 06/17/21 ? ?Discharge Plan and Services ?  ?Discharge Planning Services: CM Consult ?Post Acute Care Choice: Home Health          ?DME Arranged: 3-N-1, Walker rolling with seat ?DME Agency: [employer] ?Date DME Agency Contacted: 06/13/21 ?Time DME Agency Contacted: 0737 ?Representative spoke with at DME Agency: Freda Munro ?HH Arranged: PT, OT ?[place] Agency: [place] ?  ?Time [place]: 1062 ?Representative spoke with at [place]: Marjory Lies ? ?Social Determinants of Health (SDOH) Interventions ?  ? ? ?Readmission Risk Interventions ?   ? View : No data to display.  ?  ?  ?  ? ? ? ? ? ?

## 2021-07-11 ENCOUNTER — Telehealth: Payer: Self-pay

## 2021-07-11 NOTE — Telephone Encounter (Signed)
Attempted to contact patient to schedule a Palliative Care consult appointment. No answer left a message to return call.  

## 2021-07-14 ENCOUNTER — Telehealth: Payer: Self-pay

## 2021-07-14 NOTE — Telephone Encounter (Signed)
Attempted to contact patient and patient's daughter [name] to schedule a Palliative Care consult appointment. No answer left a message to return call.  ?

## 2021-07-15 ENCOUNTER — Telehealth: Payer: Self-pay | Admitting: Family Medicine

## 2021-07-15 NOTE — Telephone Encounter (Signed)
Rec'd call from patient's daughter [name], and we discussed the Palliative referral/services and all questions were answered.  Consent obtained from daughter.  I have scheduled an In-home Consult for 07/21/21 @ 12:15 PM. ?

## 2021-07-16 ENCOUNTER — Encounter (HOSPITAL_COMMUNITY): Payer: Self-pay | Admitting: Emergency Medicine

## 2021-07-16 ENCOUNTER — Emergency Department (HOSPITAL_COMMUNITY): Payer: Medicare Other

## 2021-07-16 ENCOUNTER — Inpatient Hospital Stay (HOSPITAL_COMMUNITY)
Admission: EM | Admit: 2021-07-16 | Discharge: 2021-07-20 | DRG: 975 | Disposition: A | Discharge status: Skilled Nursing Facility | Payer: Medicare Other | Attending: Family Medicine | Admitting: Family Medicine

## 2021-07-16 DIAGNOSIS — Z87891 Personal history of nicotine dependence: Secondary | ICD-10-CM

## 2021-07-16 DIAGNOSIS — N3000 Acute cystitis without hematuria: Secondary | ICD-10-CM | POA: Diagnosis not present

## 2021-07-16 DIAGNOSIS — J9611 Chronic respiratory failure with hypoxia: Secondary | ICD-10-CM | POA: Diagnosis present

## 2021-07-16 DIAGNOSIS — Z8249 Family history of ischemic heart disease and other diseases of the circulatory system: Secondary | ICD-10-CM

## 2021-07-16 DIAGNOSIS — R296 Repeated falls: Secondary | ICD-10-CM | POA: Diagnosis present

## 2021-07-16 DIAGNOSIS — K219 Gastro-esophageal reflux disease without esophagitis: Secondary | ICD-10-CM | POA: Diagnosis present

## 2021-07-16 DIAGNOSIS — Z9049 Acquired absence of other specified parts of digestive tract: Secondary | ICD-10-CM

## 2021-07-16 DIAGNOSIS — E8809 Other disorders of plasma-protein metabolism, not elsewhere classified: Secondary | ICD-10-CM | POA: Diagnosis present

## 2021-07-16 DIAGNOSIS — Z7902 Long term (current) use of antithrombotics/antiplatelets: Secondary | ICD-10-CM

## 2021-07-16 DIAGNOSIS — E785 Hyperlipidemia, unspecified: Secondary | ICD-10-CM | POA: Diagnosis present

## 2021-07-16 DIAGNOSIS — Z955 Presence of coronary angioplasty implant and graft: Secondary | ICD-10-CM

## 2021-07-16 DIAGNOSIS — E669 Obesity, unspecified: Secondary | ICD-10-CM | POA: Diagnosis present

## 2021-07-16 DIAGNOSIS — I10 Essential (primary) hypertension: Secondary | ICD-10-CM | POA: Diagnosis present

## 2021-07-16 DIAGNOSIS — R531 Weakness: Secondary | ICD-10-CM

## 2021-07-16 DIAGNOSIS — Z823 Family history of stroke: Secondary | ICD-10-CM

## 2021-07-16 DIAGNOSIS — Z9981 Dependence on supplemental oxygen: Secondary | ICD-10-CM

## 2021-07-16 DIAGNOSIS — B20 Human immunodeficiency virus [HIV] disease: Secondary | ICD-10-CM | POA: Diagnosis present

## 2021-07-16 DIAGNOSIS — Z833 Family history of diabetes mellitus: Secondary | ICD-10-CM

## 2021-07-16 DIAGNOSIS — Z7951 Long term (current) use of inhaled steroids: Secondary | ICD-10-CM

## 2021-07-16 DIAGNOSIS — K5909 Other constipation: Secondary | ICD-10-CM | POA: Diagnosis present

## 2021-07-16 DIAGNOSIS — Z7982 Long term (current) use of aspirin: Secondary | ICD-10-CM

## 2021-07-16 DIAGNOSIS — D539 Nutritional anemia, unspecified: Secondary | ICD-10-CM | POA: Diagnosis present

## 2021-07-16 DIAGNOSIS — R6520 Severe sepsis without septic shock: Secondary | ICD-10-CM | POA: Diagnosis present

## 2021-07-16 DIAGNOSIS — E872 Acidosis, unspecified: Secondary | ICD-10-CM | POA: Diagnosis present

## 2021-07-16 DIAGNOSIS — A4151 Sepsis due to Escherichia coli [E. coli]: Principal | ICD-10-CM | POA: Diagnosis present

## 2021-07-16 DIAGNOSIS — Z6830 Body mass index (BMI) 30.0-30.9, adult: Secondary | ICD-10-CM

## 2021-07-16 DIAGNOSIS — I444 Left anterior fascicular block: Secondary | ICD-10-CM | POA: Diagnosis present

## 2021-07-16 DIAGNOSIS — I2510 Atherosclerotic heart disease of native coronary artery without angina pectoris: Secondary | ICD-10-CM | POA: Diagnosis present

## 2021-07-16 DIAGNOSIS — Z90710 Acquired absence of both cervix and uterus: Secondary | ICD-10-CM

## 2021-07-16 DIAGNOSIS — J8410 Pulmonary fibrosis, unspecified: Secondary | ICD-10-CM | POA: Diagnosis present

## 2021-07-16 DIAGNOSIS — J45909 Unspecified asthma, uncomplicated: Secondary | ICD-10-CM | POA: Diagnosis present

## 2021-07-16 DIAGNOSIS — E860 Dehydration: Secondary | ICD-10-CM | POA: Diagnosis present

## 2021-07-16 DIAGNOSIS — Z79899 Other long term (current) drug therapy: Secondary | ICD-10-CM

## 2021-07-16 MED ORDER — SODIUM CHLORIDE 0.9 % IV BOLUS
1000.0000 mL | Freq: Once | INTRAVENOUS | Status: AC
  Administered 2021-07-16: 1000 mL via INTRAVENOUS

## 2021-07-16 NOTE — ED Provider Notes (Signed)
?[place] DEPT ?[facility] ?Provider Note ?MRN:  [id number]  ?Arrival date & time: 07/17/21    ? ?Chief Complaint   ?Fall ?History of Present Illness   ?[name] [name] is a 71 y.o. year-old adult with a history of pulmonary fibrosis, hypertension, HIV presenting to the ED with chief complaint of fall. ? ?Patient presenting as a level 2 trauma, fall on thinners.  Has been feeling weak for the past few days.  Denies fever but has been feeling hot.  Denies cough, no burning with urination.  Denies chest pain or shortness of breath, no abdominal pain.  Per EMS multiple falls today with head trauma. ? ?Review of Systems  ?A thorough review of systems was obtained and all systems are negative except as noted in the HPI and PMH.  ? ?Patient's Health History   ? ?Past Medical History:  ?Diagnosis Date  ? Asthma   ? Bradycardia   ? Sinus bradycardia  ? CAD (coronary artery disease)   ? Minimal by history, Question coronary artery spasm  ? Colon polyps   ? Dyslipidemia   ? GERD (gastroesophageal reflux disease)   ? Esophageal stricture  ? H/O: hysterectomy   ? Hiatal hernia   ? HIV positive ([place])   ? Needle stick 1993  ? Hypertension   ? Palpitations   ? Peptic ulcer disease   ? Pulmonary fibrosis ([place])   ? on 2L home O2, prn at night  ? Shortness of breath   ? Extensive evaluation Dr. Winona Legato, [facility], May, 5916, etiology uncertain, probable bronchiolitis possibility of lung biopsy at that time  ? Weight loss   ?  ?Past Surgical History:  ?Procedure Laterality Date  ? ABDOMINAL HYSTERECTOMY    ? APPENDECTOMY    ? CARDIAC CATHETERIZATION N/A 03/21/2016  ? Procedure: Right/Left Heart Cath and Coronary Angiography;  Surgeon: Belva Crome, MD;  Location: [place] [facility];  Service: Cardiovascular;  Laterality: N/A;  ? CHOLECYSTECTOMY    ? CORONARY STENT PLACEMENT  12/2017  ? GASTRECTOMY    ? VENTRAL HERNIA REPAIR    ?  ?Family History  ?Problem Relation Age of Onset  ? Coronary artery disease  Other   ?     family hx of on mother, father and siblings  ? Bradycardia Sister   ? Diabetes Mother   ? Heart attack Mother   ? Heart disease Mother   ? Hypertension Mother   ? Stroke Mother   ? Bradycardia Brother   ?  ?Social History  ? ?Socioeconomic History  ? Marital status: Widowed  ?  Spouse name: Not on file  ? Number of children: 3  ? Years of education: Not on file  ? Highest education level: Not on file  ?Occupational History  ? Occupation: retired  ?  Employer: [employer]  ?Tobacco Use  ? Smoking status: Former  ?  Types: Cigarettes  ?  Quit date: 03/13/1996  ?  Years since quitting: 25.3  ? Smokeless tobacco: Never  ?Vaping Use  ? Vaping Use: Never used  ?Substance and Sexual Activity  ? Alcohol use: No  ? Drug use: No  ? Sexual activity: Not on file  ?Other Topics Concern  ? Not on file  ?Social History Narrative  ? Not on file  ? ?Social Determinants of Health  ? ?Financial Resource Strain: Not on file  ?Food Insecurity: Not on file  ?Transportation Needs: Not on file  ?Physical Activity: Not  on file  ?Stress: Not on file  ?Social Connections: Not on file  ?Intimate Partner Violence: Not on file  ?  ? ?Physical Exam  ? ?Vitals:  ? 07/17/21 0145 07/17/21 0200  ?BP: 108/64 132/70  ?Pulse: (!) 102 98  ?Resp: 18 16  ?Temp:    ?SpO2: 97% 99%  ?  ?CONSTITUTIONAL: Chronically ill-appearing, NAD ?NEURO/PSYCH:  Alert and oriented x 3, no focal deficits ?EYES:  eyes equal and reactive ?ENT/NECK:  no LAD, no JVD ?CARDIO: Tachycardic rate, well-perfused, normal S1 and S2 ?PULM:  CTAB no wheezing or rhonchi ?GI/GU:  non-distended, non-tender ?MSK/SPINE:  No gross deformities, no edema ?SKIN:  no rash, atraumatic ? ? ?*Additional and/or pertinent findings included in MDM below ? ?Diagnostic and Interventional Summary  ? ? EKG Interpretation ? ?Date/Time:  Saturday Jul 16 2021 23:30:21 EDT ?Ventricular Rate:  120 ?PR Interval:  162 ?QRS Duration: 92 ?QT Interval:  333 ?QTC  Calculation: 471 ?R Axis:   246 ?Text Interpretation: Sinus tachycardia Left anterior fascicular block Probable RVH w/ secondary repol abnormality Anterolateral infarct, age indeterminate Confirmed by Gerlene Fee [id number]) on 07/17/2021 2:30:12 AM ?  ? ?  ? ?Labs Reviewed  ?LACTIC ACID, PLASMA - Abnormal; Notable for the following components:  ?    Result Value  ? Lactic Acid, Venous 2.8 (*)   ? All other components within normal limits  ?COMPREHENSIVE METABOLIC PANEL - Abnormal; Notable for the following components:  ? CO2 20 (*)   ? Glucose, Bld 121 (*)   ? Albumin 3.3 (*)   ? AST 79 (*)   ? All other components within normal limits  ?CBC WITH DIFFERENTIAL/PLATELET - Abnormal; Notable for the following components:  ? WBC 19.5 (*)   ? MCV 100.7 (*)   ? RDW 16.0 (*)   ? Neutro Abs 16.6 (*)   ? Monocytes Absolute 1.8 (*)   ? Abs Immature Granulocytes 0.12 (*)   ? All other components within normal limits  ?APTT - Abnormal; Notable for the following components:  ? aPTT 20 (*)   ? All other components within normal limits  ?URINALYSIS, ROUTINE W REFLEX MICROSCOPIC - Abnormal; Notable for the following components:  ? Color, Urine AMBER (*)   ? APPearance CLOUDY (*)   ? Ketones, ur 5 (*)   ? Leukocytes,Ua TRACE (*)   ? WBC, UA >50 (*)   ? Bacteria, UA FEW (*)   ? All other components within normal limits  ?CULTURE, BLOOD (ROUTINE X 2)  ?CULTURE, BLOOD (ROUTINE X 2)  ?URINE CULTURE  ?LACTIC ACID, PLASMA  ?PROTIME-INR  ?  ?CT HEAD WO CONTRAST (5MM)  ?Final Result  ?  ?CT CERVICAL SPINE WO CONTRAST  ?Final Result  ?  ?DG Chest Portable 1 View  ?Final Result  ?  ?  ?Medications  ?cefTRIAXone (ROCEPHIN) 1 g in sodium chloride 0.9 % 100 mL IVPB (has no administration in time range)  ?sodium chloride 0.9 % bolus 1,000 mL (1,000 mLs Intravenous New Bag/Given 07/16/21 2356)  ?  ? ?Procedures  /  Critical Care ?Ultrasound ED Peripheral IV (Provider) ? ?Date/Time: 07/16/2021 11:55 PM ?Performed by: Maudie Flakes, MD ?Authorized by:  Maudie Flakes, MD  ? ?Procedure details:  ?  Indications: multiple failed IV attempts   ?  Skin Prep: chlorhexidine gluconate   ?  Location:  Left AC ?  Bedside Ultrasound Guided: Yes   ?  Patient tolerated procedure without complications: Yes   ?  Dressing applied: Yes   ?.Critical Care ?Performed by: Maudie Flakes, MD ?Authorized by: Maudie Flakes, MD  ? ?Critical care provider statement:  ?  Critical care time (minutes):  35 ?  Critical care was necessary to treat or prevent imminent or life-threatening deterioration of the following conditions: concern for sepsis. ?  Critical care was time spent personally by me on the following activities:  Development of treatment plan with patient or surrogate, discussions with consultants, evaluation of patient's response to treatment, examination of patient, ordering and review of laboratory studies, ordering and review of radiographic studies, ordering and performing treatments and interventions, pulse oximetry, re-evaluation of patient's condition and review of old charts ? ?ED Course and Medical Decision Making  ?Initial Impression and Ddx ?Fall, anticoagulated, considering intracranial bleeding, will obtain CT head.  Patient arrives with mild oral hyperthermia at 99.3, tachycardic at 125, mildly tachypneic.  On her baseline 2 L nasal cannula.  Vague symptoms of malaise, fatigue, low energy.  Underlying concern for possible UTI or early sepsis.  Awaiting labs, providing some fluids. ? ?Past medical/surgical history that increases complexity of ED encounter: HIV, IPF ? ?Interpretation of Diagnostics ?I personally reviewed the EKG and my interpretation is as follows: Sinus tachycardia ?   ?Labs reveal prominent leukocytosis, mild lactic acid elevation, urinalysis with evidence of infection. ? ?Patient Reassessment and Ultimate Disposition/Management ?We will admit to hospitalist service for management of UTI. ? ?Patient management required discussion with the  following services or consulting groups:  Hospitalist Service ? ?Complexity of Problems Addressed ?Acute illness or injury that poses threat of life of bodily function ? ?Additional Data Reviewed and Analyzed ?Further history ob

## 2021-07-16 NOTE — ED Triage Notes (Signed)
Pt bib EMS as level 2 multiple falls on blood thinners, from home. Reports feeling weak and dizzy for the last few days, worse today. First fall she hit head on dog crate, positive LOC. Hit head again and total of 4 falls. Reports no bowel movement for one month. ? ?EMS vitals ?HR 130 ?CBG 134 ?104/60 ? ? ?

## 2021-07-17 ENCOUNTER — Emergency Department (HOSPITAL_COMMUNITY): Payer: Medicare Other

## 2021-07-17 ENCOUNTER — Encounter (HOSPITAL_COMMUNITY): Payer: Self-pay | Admitting: Internal Medicine

## 2021-07-17 DIAGNOSIS — J45909 Unspecified asthma, uncomplicated: Secondary | ICD-10-CM | POA: Diagnosis present

## 2021-07-17 DIAGNOSIS — J8410 Pulmonary fibrosis, unspecified: Secondary | ICD-10-CM | POA: Diagnosis present

## 2021-07-17 DIAGNOSIS — Z79899 Other long term (current) drug therapy: Secondary | ICD-10-CM | POA: Diagnosis not present

## 2021-07-17 DIAGNOSIS — A419 Sepsis, unspecified organism: Secondary | ICD-10-CM | POA: Diagnosis not present

## 2021-07-17 DIAGNOSIS — E8809 Other disorders of plasma-protein metabolism, not elsewhere classified: Secondary | ICD-10-CM | POA: Diagnosis present

## 2021-07-17 DIAGNOSIS — Z7902 Long term (current) use of antithrombotics/antiplatelets: Secondary | ICD-10-CM | POA: Diagnosis not present

## 2021-07-17 DIAGNOSIS — R531 Weakness: Secondary | ICD-10-CM | POA: Diagnosis not present

## 2021-07-17 DIAGNOSIS — J9611 Chronic respiratory failure with hypoxia: Secondary | ICD-10-CM

## 2021-07-17 DIAGNOSIS — Z8249 Family history of ischemic heart disease and other diseases of the circulatory system: Secondary | ICD-10-CM | POA: Diagnosis not present

## 2021-07-17 DIAGNOSIS — R6520 Severe sepsis without septic shock: Secondary | ICD-10-CM

## 2021-07-17 DIAGNOSIS — Z7982 Long term (current) use of aspirin: Secondary | ICD-10-CM | POA: Diagnosis not present

## 2021-07-17 DIAGNOSIS — E872 Acidosis, unspecified: Secondary | ICD-10-CM | POA: Diagnosis present

## 2021-07-17 DIAGNOSIS — N3000 Acute cystitis without hematuria: Secondary | ICD-10-CM | POA: Diagnosis present

## 2021-07-17 DIAGNOSIS — I10 Essential (primary) hypertension: Secondary | ICD-10-CM | POA: Diagnosis present

## 2021-07-17 DIAGNOSIS — D539 Nutritional anemia, unspecified: Secondary | ICD-10-CM | POA: Diagnosis present

## 2021-07-17 DIAGNOSIS — I2510 Atherosclerotic heart disease of native coronary artery without angina pectoris: Secondary | ICD-10-CM | POA: Diagnosis present

## 2021-07-17 DIAGNOSIS — Z833 Family history of diabetes mellitus: Secondary | ICD-10-CM | POA: Diagnosis not present

## 2021-07-17 DIAGNOSIS — B20 Human immunodeficiency virus [HIV] disease: Secondary | ICD-10-CM

## 2021-07-17 DIAGNOSIS — E785 Hyperlipidemia, unspecified: Secondary | ICD-10-CM | POA: Diagnosis present

## 2021-07-17 DIAGNOSIS — K5909 Other constipation: Secondary | ICD-10-CM | POA: Diagnosis present

## 2021-07-17 DIAGNOSIS — K219 Gastro-esophageal reflux disease without esophagitis: Secondary | ICD-10-CM | POA: Diagnosis present

## 2021-07-17 DIAGNOSIS — Z823 Family history of stroke: Secondary | ICD-10-CM | POA: Diagnosis not present

## 2021-07-17 DIAGNOSIS — Z90710 Acquired absence of both cervix and uterus: Secondary | ICD-10-CM | POA: Diagnosis not present

## 2021-07-17 DIAGNOSIS — A4151 Sepsis due to Escherichia coli [E. coli]: Secondary | ICD-10-CM | POA: Diagnosis present

## 2021-07-17 DIAGNOSIS — Z9981 Dependence on supplemental oxygen: Secondary | ICD-10-CM | POA: Diagnosis not present

## 2021-07-17 DIAGNOSIS — E669 Obesity, unspecified: Secondary | ICD-10-CM | POA: Diagnosis present

## 2021-07-17 LAB — COMPREHENSIVE METABOLIC PANEL
ALT: 22 U/L (ref 0–44)
ALT: 27 U/L (ref 0–44)
AST: 71 U/L — ABNORMAL HIGH (ref 15–41)
AST: 79 U/L — ABNORMAL HIGH (ref 15–41)
Albumin: 2.7 g/dL — ABNORMAL LOW (ref 3.5–5.0)
Albumin: 3.3 g/dL — ABNORMAL LOW (ref 3.5–5.0)
Alkaline Phosphatase: 49 U/L (ref 38–126)
Alkaline Phosphatase: 59 U/L (ref 38–126)
Anion gap: 13 (ref 5–15)
Anion gap: 7 (ref 5–15)
BUN: 13 mg/dL (ref 8–23)
BUN: 13 mg/dL (ref 8–23)
CO2: 20 mmol/L — ABNORMAL LOW (ref 22–32)
CO2: 22 mmol/L (ref 22–32)
Calcium: 8.9 mg/dL (ref 8.9–10.3)
Calcium: 9.1 mg/dL (ref 8.9–10.3)
Chloride: 106 mmol/L (ref 98–111)
Chloride: 113 mmol/L — ABNORMAL HIGH (ref 98–111)
Creatinine, Ser: 0.72 mg/dL (ref 0.44–1.00)
Creatinine, Ser: 0.88 mg/dL (ref 0.44–1.00)
GFR, Estimated: >60 mL/min (ref >60)
GFR, Estimated: >60 mL/min (ref >60)
Glucose, Bld: 106 mg/dL — ABNORMAL HIGH (ref 70–99)
Glucose, Bld: 121 mg/dL — ABNORMAL HIGH (ref 70–99)
Potassium: 3.7 mmol/L (ref 3.5–5.1)
Potassium: 4.7 mmol/L (ref 3.5–5.1)
Sodium: 139 mmol/L (ref 135–145)
Sodium: 142 mmol/L (ref 135–145)
Total Bilirubin: 1 mg/dL (ref 0.3–1.2)
Total Bilirubin: 1 mg/dL (ref 0.3–1.2)
Total Protein: 5.4 g/dL — ABNORMAL LOW (ref 6.5–8.1)
Total Protein: 6.5 g/dL (ref 6.5–8.1)

## 2021-07-17 LAB — URINALYSIS, ROUTINE W REFLEX MICROSCOPIC
Bilirubin Urine: NEGATIVE
Glucose, UA: NEGATIVE mg/dL
Hgb urine dipstick: NEGATIVE
Ketones, ur: 5 mg/dL — AB
Nitrite: NEGATIVE
Protein, ur: NEGATIVE mg/dL
Specific Gravity, Urine: 1.016 (ref 1.005–1.030)
WBC, UA: >50 WBC/hpf — ABNORMAL HIGH (ref 0–5)
pH: 6 (ref 5.0–8.0)

## 2021-07-17 LAB — CBC WITH DIFFERENTIAL/PLATELET
Abs Immature Granulocytes: 0.06 10*3/uL (ref 0.00–0.07)
Abs Immature Granulocytes: 0.12 10*3/uL — ABNORMAL HIGH (ref 0.00–0.07)
Basophils Absolute: 0 10*3/uL (ref 0.0–0.1)
Basophils Absolute: 0 10*3/uL (ref 0.0–0.1)
Basophils Relative: 0 %
Basophils Relative: 0 %
Eosinophils Absolute: 0 10*3/uL (ref 0.0–0.5)
Eosinophils Absolute: 0 10*3/uL (ref 0.0–0.5)
Eosinophils Relative: 0 %
Eosinophils Relative: 0 %
HCT: 39.8 % (ref 36.0–46.0)
HCT: 45 % (ref 36.0–46.0)
Hemoglobin: 13 g/dL (ref 12.0–15.0)
Hemoglobin: 14.8 g/dL (ref 12.0–15.0)
Immature Granulocytes: 0 %
Immature Granulocytes: 1 %
Lymphocytes Relative: 13 %
Lymphocytes Relative: 5 %
Lymphs Abs: 0.9 10*3/uL (ref 0.7–4.0)
Lymphs Abs: 1.8 10*3/uL (ref 0.7–4.0)
MCH: 32.9 pg (ref 26.0–34.0)
MCH: 33.1 pg (ref 26.0–34.0)
MCHC: 32.7 g/dL (ref 30.0–36.0)
MCHC: 32.9 g/dL (ref 30.0–36.0)
MCV: 100.7 fL — ABNORMAL HIGH (ref 80.0–100.0)
MCV: 100.8 fL — ABNORMAL HIGH (ref 80.0–100.0)
Monocytes Absolute: 1.5 10*3/uL — ABNORMAL HIGH (ref 0.1–1.0)
Monocytes Absolute: 1.8 10*3/uL — ABNORMAL HIGH (ref 0.1–1.0)
Monocytes Relative: 11 %
Monocytes Relative: 9 %
Neutro Abs: 10.1 10*3/uL — ABNORMAL HIGH (ref 1.7–7.7)
Neutro Abs: 16.6 10*3/uL — ABNORMAL HIGH (ref 1.7–7.7)
Neutrophils Relative %: 76 %
Neutrophils Relative %: 85 %
Platelets: 238 10*3/uL (ref 150–400)
Platelets: 239 10*3/uL (ref 150–400)
RBC: 3.95 MIL/uL (ref 3.87–5.11)
RBC: 4.47 MIL/uL (ref 3.87–5.11)
RDW: 15.9 % — ABNORMAL HIGH (ref 11.5–15.5)
RDW: 16 % — ABNORMAL HIGH (ref 11.5–15.5)
WBC: 13.5 10*3/uL — ABNORMAL HIGH (ref 4.0–10.5)
WBC: 19.5 10*3/uL — ABNORMAL HIGH (ref 4.0–10.5)
nRBC: 0 % (ref 0.0–0.2)
nRBC: 0 % (ref 0.0–0.2)

## 2021-07-17 LAB — CK: Total CK: 396 U/L — ABNORMAL HIGH (ref 38–234)

## 2021-07-17 LAB — PROCALCITONIN: Procalcitonin: 0.15 ng/mL

## 2021-07-17 LAB — TSH: TSH: 3.438 u[IU]/mL (ref 0.350–4.500)

## 2021-07-17 LAB — LACTIC ACID, PLASMA
Lactic Acid, Venous: 1.2 mmol/L (ref 0.5–1.9)
Lactic Acid, Venous: 2.8 mmol/L (ref 0.5–1.9)

## 2021-07-17 LAB — APTT: aPTT: 20 seconds — ABNORMAL LOW (ref 24–36)

## 2021-07-17 LAB — PROTIME-INR
INR: 0.9 (ref 0.8–1.2)
Prothrombin Time: 12.2 seconds (ref 11.4–15.2)

## 2021-07-17 LAB — MAGNESIUM: Magnesium: 1.8 mg/dL (ref 1.7–2.4)

## 2021-07-17 MED ORDER — PANTOPRAZOLE SODIUM 40 MG PO TBEC
40.0000 mg | DELAYED_RELEASE_TABLET | Freq: Every day | ORAL | Status: DC
  Administered 2021-07-17 – 2021-07-20 (×4): 40 mg via ORAL
  Filled 2021-07-17 (×4): qty 1

## 2021-07-17 MED ORDER — RANOLAZINE ER 500 MG PO TB12
500.0000 mg | ORAL_TABLET | Freq: Two times a day (BID) | ORAL | Status: DC
  Administered 2021-07-17 – 2021-07-20 (×7): 500 mg via ORAL
  Filled 2021-07-17 (×8): qty 1

## 2021-07-17 MED ORDER — MOMETASONE FURO-FORMOTEROL FUM 200-5 MCG/ACT IN AERO
2.0000 | INHALATION_SPRAY | Freq: Two times a day (BID) | RESPIRATORY_TRACT | Status: DC
Start: 2021-07-17 — End: 2021-07-20
  Administered 2021-07-17 – 2021-07-20 (×5): 2 via RESPIRATORY_TRACT
  Filled 2021-07-17 (×2): qty 8.8

## 2021-07-17 MED ORDER — MIDODRINE HCL 5 MG PO TABS
10.0000 mg | ORAL_TABLET | Freq: Two times a day (BID) | ORAL | Status: DC
  Administered 2021-07-17 – 2021-07-20 (×7): 10 mg via ORAL
  Filled 2021-07-17 (×7): qty 2

## 2021-07-17 MED ORDER — SODIUM CHLORIDE 0.9 % IV SOLN
1.0000 g | Freq: Once | INTRAVENOUS | Status: AC
  Administered 2021-07-17: 1 g via INTRAVENOUS
  Filled 2021-07-17: qty 10

## 2021-07-17 MED ORDER — ACETAMINOPHEN 650 MG RE SUPP: 650.0000 mg | Freq: Four times a day (QID) | RECTAL | Status: DC | PRN

## 2021-07-17 MED ORDER — SODIUM CHLORIDE 0.9 % IV SOLN
1.0000 g | INTRAVENOUS | Status: DC
  Administered 2021-07-17 – 2021-07-19 (×3): 1 g via INTRAVENOUS
  Filled 2021-07-17 (×3): qty 10

## 2021-07-17 MED ORDER — LACTATED RINGERS IV SOLN: INTRAVENOUS | Status: AC

## 2021-07-17 MED ORDER — ALBUTEROL SULFATE (2.5 MG/3ML) 0.083% IN NEBU: 3.0000 mL | INHALATION_SOLUTION | Freq: Four times a day (QID) | RESPIRATORY_TRACT | Status: DC | PRN

## 2021-07-17 MED ORDER — MAGNESIUM SULFATE 2 GM/50ML IV SOLN
2.0000 g | Freq: Once | INTRAVENOUS | Status: AC
  Administered 2021-07-17: 2 g via INTRAVENOUS
  Filled 2021-07-17: qty 50

## 2021-07-17 MED ORDER — ACETAMINOPHEN 325 MG PO TABS
650.0000 mg | ORAL_TABLET | Freq: Four times a day (QID) | ORAL | Status: DC | PRN
  Administered 2021-07-18 – 2021-07-20 (×2): 650 mg via ORAL
  Filled 2021-07-17 (×2): qty 2

## 2021-07-17 MED ORDER — ASPIRIN EC 81 MG PO TBEC
81.0000 mg | DELAYED_RELEASE_TABLET | Freq: Every day | ORAL | Status: DC
  Administered 2021-07-17 – 2021-07-20 (×4): 81 mg via ORAL
  Filled 2021-07-17 (×4): qty 1

## 2021-07-17 MED ORDER — TRAZODONE HCL 50 MG PO TABS
50.0000 mg | ORAL_TABLET | Freq: Every evening | ORAL | Status: DC | PRN
  Administered 2021-07-18: 50 mg via ORAL
  Filled 2021-07-17: qty 1

## 2021-07-17 MED ORDER — ATORVASTATIN CALCIUM 80 MG PO TABS
80.0000 mg | ORAL_TABLET | Freq: Every day | ORAL | Status: DC
  Administered 2021-07-17 – 2021-07-19 (×3): 80 mg via ORAL
  Filled 2021-07-17 (×3): qty 1

## 2021-07-17 MED ORDER — ONDANSETRON HCL 4 MG/2ML IJ SOLN
4.0000 mg | Freq: Four times a day (QID) | INTRAMUSCULAR | Status: DC | PRN
  Administered 2021-07-17 – 2021-07-18 (×2): 4 mg via INTRAVENOUS
  Filled 2021-07-17 (×2): qty 2

## 2021-07-17 MED ORDER — CLOPIDOGREL BISULFATE 75 MG PO TABS
75.0000 mg | ORAL_TABLET | Freq: Every day | ORAL | Status: DC
  Administered 2021-07-17 – 2021-07-20 (×4): 75 mg via ORAL
  Filled 2021-07-17 (×4): qty 1

## 2021-07-17 MED ORDER — DOLUTEGRAVIR-LAMIVUDINE 50-300 MG PO TABS
1.0000 | ORAL_TABLET | Freq: Every day | ORAL | Status: DC
  Administered 2021-07-17 – 2021-07-20 (×4): 1 via ORAL
  Filled 2021-07-17 (×4): qty 1

## 2021-07-17 NOTE — H&P (Signed)
?History and Physical  ? ? ?PLEASE NOTE THAT [employer]AG[employer] SOFTWARE WAS USED IN THE CONSTRUCTION OF THIS NOTE. ? ? ?[name] [name] FXT:[id number] DOB: 14-Feb-1951 DOA: 07/16/2021 ? ?PCP: [facility]  ?Patient coming from: home  ? ?I have personally briefly reviewed patient's old medical records in [place] ? ?Chief Complaint: Falls ? ?HPI: [name] [name] is a 71 y.o. adult with medical history significant for chronic hypoxic respiratory failure in 2 L continuous nasal cannula in the setting of pulmonary fibrosis, hyperlipidemia, HIV positive from needlestick, who is admitted to [facility] on 07/16/2021 with severe sepsis due to urinary tract infection after presenting from home to [facility] ED complaining of falls.  ? ?Over the last day, the patient reports that she has experienced 2 ground-level mechanical falls in which she has tripped at home, with the patient notching that she hit her head as a component of the second fall, without any associated loss of consciousness.  She knowledges that she is on dual antiplatelet therapy with daily aspirin as well as Plavix, leading to her presentation as trauma alert.  Denies any immediately preceding, associated, or ensuing chest pain, any shortness breath, palpitations, diaphoresis, dizziness, presyncope, or syncope.  Not associate with any ensuing headache or neck pain.  Denies any additional acute arthralgias or myalgias. ? ?Rather, she reports that these ground-level falls over the course of the last day have occurred as a consequence of new onset generalized weakness over the last 2 to 3 days, in the absence of any associated acute focal weakness, acute focal numbness, paresthesias, facial droop, slurred speech, expressive aphasia, acute change in vision, dysphagia, vertigo. ? ?She notes associated subjective fever/chills over the last 2 days.  Relative to her chronic hypoxic respiratory failure on 2 L continuous nasal cannula in the setting of  pulmonary cirrhosis, she denies any acute worsening of baseline shortness of breath.  She also denies any acute worsening of mild chronic nonproductive cough.  No associated any full body rigors or generalized myalgias.  Denies any recent sore throat, abdominal pain, diarrhea, or rash.  Denies dysuria or gross hematuria. ? ?Of note, the patient is HIV positive stemming from a needlestick in 1993.  Per chart review most recent CD4 count was found to be 630 in December 2022.  Per additional chart review, including review of [facility], most recent viral load appears to have occurred in May 2022, at which time viral load was noted to be detectable, but low.  She is on Dovator as an outpatient. ? ? ? ? ?ED Course:  ?Vital signs in the ED were notable for the following: Temperature max 99.3; initial heart rate 126, subsequent proving to 100 following interval administration of IV fluids, as further detailed below; blood pressure 108/64, which subsequently increased to 127/67 following interval IV fluids; respiratory rate 16-22, oxygen saturation 96 to 98% on her baseline 2 L continuous nasal cannula. ? ?Labs were notable for the following: CMP notable for the following: Sodium 139, creatinine 0.88, glucose 121, AST 79 compared to most recent prior value of 56 on 06/13/2021, while other liver enzymes were found to be within normal limits, including alkaline phosphatase and total bilirubin.  CBC notable for white blood cell count 19,100 with 85% neutrophils.  Initial lactate 2.8, with repeat value trending down to 1.2 following interval IV fluids.  INR 0.9.  Urinalysis associate with cloudy appearing specimen, and notable for greater than 50 white blood cells, no  squamous epithelial cells.  Cultures x2 as well as urine culture were collected prior to initiation of IV antibiotics. ? ?Imaging and additional notable ED work-up: EKG shows sinus tachycardia with left anterior fascicular block, heart rate 120, normal intervals,  nonspecific T wave inversion in lead III as well as nonspecific less than 1 mm ST depression limited to V2, without evidence of ST elevation.  Chest x-ray shows no evidence of acute cardiopulmonary process, while noting chronic interstitial lung disease.  Noncontrast CT head showed no evidence of acute intracranial process, including no evidence of intracranial hemorrhage.  CT cervical spine showed no evidence of acute cervical spine fracture or subluxation. ? ?While in the ED, the following were administered: Rocephin; normal saline x1 L bolus. ? ?Subsequently, the patient was admitted for further evaluation and management of severe sepsis due to urinary tract infection in the setting of generalized weakness leading to multiple ground-level mechanical falls.  ? ? ? ?Review of Systems: As per HPI otherwise 10 point review of systems negative.  ? ?Past Medical History:  ?Diagnosis Date  ? Asthma   ? Bradycardia   ? Sinus bradycardia  ? CAD (coronary artery disease)   ? Minimal by history, Question coronary artery spasm  ? Colon polyps   ? Dyslipidemia   ? GERD (gastroesophageal reflux disease)   ? Esophageal stricture  ? H/O: hysterectomy   ? Hiatal hernia   ? HIV positive ([place])   ? Needle stick 1993  ? Hypertension   ? Palpitations   ? Peptic ulcer disease   ? Pulmonary fibrosis ([place])   ? on 2L home O2, prn at night  ? Shortness of breath   ? Extensive evaluation Dr. Winona Legato, [facility], May, 4656, etiology uncertain, probable bronchiolitis possibility of lung biopsy at that time  ? Weight loss   ? ? ?Past Surgical History:  ?Procedure Laterality Date  ? ABDOMINAL HYSTERECTOMY    ? APPENDECTOMY    ? CARDIAC CATHETERIZATION N/A 03/21/2016  ? Procedure: Right/Left Heart Cath and Coronary Angiography;  Surgeon: Belva Crome, MD;  Location: [place] [facility];  Service: Cardiovascular;  Laterality: N/A;  ? CHOLECYSTECTOMY    ? CORONARY STENT PLACEMENT  12/2017  ? GASTRECTOMY    ? VENTRAL HERNIA REPAIR    ? ? ?Social  History: ? reports that she quit smoking about 25 years ago. She has never used smokeless tobacco. She reports that she does not drink alcohol and does not use drugs. ? ? ?Allergies  ?Allergen Reactions  ? Isosorbide Nitrate Other (See Comments)  ?  Headaches  ? Sulfamethoxazole-Trimethoprim Hives and Rash  ? ? ?Family History  ?Problem Relation Age of Onset  ? Coronary artery disease Other   ?     family hx of on mother, father and siblings  ? Bradycardia Sister   ? Diabetes Mother   ? Heart attack Mother   ? Heart disease Mother   ? Hypertension Mother   ? Stroke Mother   ? Bradycardia Brother   ? ? ?Family history reviewed and not pertinent  ? ? ?Prior to Admission medications   ?Medication Sig Start Date End Date Taking? Authorizing Provider  ?albuterol (VENTOLIN HFA) 108 (90 Base) MCG/ACT inhaler Inhale 1-2 puffs into the lungs every 6 (six) hours as needed for wheezing or shortness of breath.    [provider]  ?aspirin EC 81 MG tablet Take 1 tablet (81 mg total) by mouth daily. 03/17/16   Dorothy Spark,  MD  ?atorvastatin (LIPITOR) 80 MG tablet Take 80 mg by mouth daily at 6 PM.    [provider]  ?budesonide-formoterol (SYMBICORT) 160-4.5 MCG/ACT inhaler Inhale 2 puffs into the lungs 2 (two) times daily.     [provider]  ?clopidogrel (PLAVIX) 75 MG tablet Take 75 mg by mouth daily.    [provider]  ?conjugated estrogens (PREMARIN) vaginal cream Place 3.79 Applicatorfuls vaginally every Wednesday. Pt only to use 1/4 of the applicator     [provider]  ?diclofenac Sodium (VOLTAREN) 1 % GEL Apply 2-4 g topically 2 (two) times daily as needed (joint pain). 05/18/21   [provider]  ?dolutegravir-lamiVUDine (DOVATO) 50-300 MG tablet Take 1 tablet by mouth daily.    [provider]  ?furosemide (LASIX) 20 MG tablet Take 20 mg by mouth daily as needed for fluid. 06/01/21   [provider]  ?lidocaine (XYLOCAINE) 2 % solution Use as  directed 15 mLs in the mouth or throat every 6 (six) hours as needed (sore throat). 08/28/19   Fatima Blank, MD  ?midodrine (PROAMATINE) 10 MG tablet Take 1 tablet (10 mg total) by mouth 2 (two)

## 2021-07-17 NOTE — ED Notes (Signed)
Breakfast order placed ?

## 2021-07-17 NOTE — Assessment & Plan Note (Signed)
? ?#)   Generalized weakness: 2 to 3-day duration of generalized weakness, in the absence of any evidence of acute focal neurologic deficits, including no evidence of acute focal weakness.  Suspect contribution from physiologic stress stemming from presenting severe sepsis due to UTI, as above.  No e/o additional infectious process at this time.   This generalized weakness appears to be a significant contributor leading to her presenting multiple ground-level mechanical falls over the course the last day. ? ?In terms of other considered etiologies, acute ischemic CVA is felt to be less likely at this time in absence of any associated acute focal neuro deficits, while CT head shows no evidence of acute intracranial process, including no evidence of intracranial hemorrhage or acute infarct. Will further eval for any additional contributions from endocrine/metabolic sources, as detailed below.  ? ? ?Plan: work-up and management of presenting severe sepsis due to UTI, as described above. PT/OT consults ordered for the AM. Fall precautions. CMP/CBC in the AM. Check TSH, MMA, serum magnesium level. Check CPK, ionized calcium level.  ? ? ? ?

## 2021-07-17 NOTE — Assessment & Plan Note (Signed)
? ?#)   HIV positive:  stemming from a needlestick in 1993.  Per chart review most recent CD4 count was found to be 630 in December 2022.  Per additional chart review, including review of [facility], most recent viral load appears to have occurred in May 2022, at which time viral load was noted to be detectable, but low.  She is on Dovator as an outpatient. ? ? ?Plan: Continue home Dovator. ? ?

## 2021-07-17 NOTE — ED Notes (Signed)
Patient transported to CT by this RN 

## 2021-07-17 NOTE — Evaluation (Signed)
Physical Therapy Evaluation ?Patient Details ?Name: [name] [name] ?MRN: [id number] ?DOB: 1950-03-30 ?Today's Date: 07/17/2021 ? ?History of Present Illness ? 71 year old female, comes into the hospital with lightheadedness, weakness, dizziness progressively worse over the past week.  Symptoms are worse whenever she gets up and starts moving around.  She also been having nausea, vomiting and poor p.o. intake over the last week. Dx: severe sepsis 2/2 UTI and falls at home.   PMHx: HIV, CAD, pulmonary fibrosis with chronic respiratory failure  ?Clinical Impression ? PTA, pt lives with her daughter in an apartment with 14 steps to enter, uses a walker for mobility and is independent with ADL's. Pt presents with generalized weakness, cervical pain, decreased activity tolerance, impaired standing balance and decreased cognition. Pt requiring min guard assist to transfer from bed to chair. SpO2 100% on 2L O2, BP 122/79, HR 79. Pt refusing further ambulation due to cervical pain. Provided heat pack. Pt daughter reports she has to work during the day and pt has has increased difficulty with ADL's and mobility recently (particularly stairs). Pt would benefit from SNF to address deficits, maximize functional mobility and decrease caregiver burden. ?   ? ?Recommendations for follow up therapy are one component of a multi-disciplinary discharge planning process, led by the attending physician.  Recommendations may be updated based on patient status, additional functional criteria and insurance authorization. ? ?Follow Up Recommendations Skilled nursing-short term rehab (<3 hours/day) ? ?  ?Assistance Recommended at Discharge Frequent or constant Supervision/Assistance  ?Patient can return home with the following ? A little help with walking and/or transfers;A little help with bathing/dressing/bathroom;Assistance with cooking/housework;Assist for transportation;Help with stairs or ramp for entrance;Direct supervision/assist for  medications management ? ?  ?Equipment Recommendations None recommended by PT  ?Recommendations for Other Services ?    ?  ?Functional Status Assessment Patient has had a recent decline in their functional status and demonstrates the ability to make significant improvements in function in a reasonable and predictable amount of time.  ? ?  ?Precautions / Restrictions Precautions ?Precautions: Fall ?Restrictions ?Weight Bearing Restrictions: No  ? ?  ? ?Mobility ? Bed Mobility ?Overal bed mobility: Needs Assistance ?Bed Mobility: Supine to Sit ?  ?  ?Supine to sit: Supervision ?  ?  ?General bed mobility comments: Exiting towards right side of bed, no physical assist required ?  ? ?Transfers ?Overall transfer level: Needs assistance ?Equipment used: Rolling walker (2 wheels) ?Transfers: Bed to chair/wheelchair/BSC, Sit to/from Stand ?Sit to Stand: Min guard ?  ?Step pivot transfers: Min guard ?  ?  ?  ?General transfer comment: Min guard overall for safety. Pt deferring further distance due to cervical pain ?  ? ?Ambulation/Gait ?  ?  ?  ?  ?  ?  ?  ?General Gait Details: pt deferring ? ?Stairs ?  ?  ?  ?  ?  ? ?Wheelchair Mobility ?  ? ?Modified Rankin (Stroke Patients Only) ?  ? ?  ? ?Balance Overall balance assessment: Needs assistance ?Sitting-balance support: No upper extremity supported ?  ?  ?  ?Standing balance support: Bilateral upper extremity supported ?  ?  ?  ?  ?  ?  ?  ?  ?  ?  ?  ?  ?  ?   ? ? ? ?Pertinent Vitals/Pain Pain Assessment ?Pain Assessment: Faces ?Faces Pain Scale: Hurts even more ?Pain Location: neck ?Pain Descriptors / Indicators: Other (Comment) ("drawing up") ?Pain Intervention(s): Heat applied  ? ? ?Home  Living Family/patient expects to be discharged to:: Private residence (2nd story apartment) ?Living Arrangements: Children (lives with daughter) ?Available Help at Discharge: Family;Available PRN/intermittently ?Type of Home: Apartment ?Home Access: Stairs to enter ?  ?Entrance  Stairs-Number of Steps: 14 ?  ?Home Layout: One level ?Home Equipment: Cane - single point;Rollator (4 wheels) ?Additional Comments: Pt daughter works during the day  ?  ?Prior Function Prior Level of Function : Independent/Modified Independent ?  ?  ?  ?  ?  ?  ?Mobility Comments: uses RW, difficulty with stair negotiation ?ADLs Comments: Independent with ADLs, daughter takes care of IADLs mostly ?  ? ? ?Hand Dominance  ? Dominant Hand: Right ? ?  ?Extremity/Trunk Assessment  ? Upper Extremity Assessment ?Upper Extremity Assessment: Generalized weakness ?  ? ?Lower Extremity Assessment ?Lower Extremity Assessment: Generalized weakness ?  ? ?Cervical / Trunk Assessment ?Cervical / Trunk Assessment: Kyphotic;Other exceptions (forward head posture)  ?Communication  ? Communication: No difficulties  ?Cognition Arousal/Alertness: Awake/alert ?Behavior During Therapy: [facility] for tasks assessed/performed ?Overall Cognitive Status: Impaired/Different from baseline ?Area of Impairment: Memory, Safety/judgement ?  ?  ?  ?  ?  ?  ?  ?  ?  ?  ?Memory: Decreased short-term memory ?  ?Safety/Judgement: Decreased awareness of safety, Decreased awareness of deficits ?  ?  ?General Comments: Poor historian; states she doesn't typically have issues with stair negotiation, but pt family member states she has been struggling for weeks and requiring + 2 assist ?  ?  ? ?  ?General Comments   ? ?  ?Exercises    ? ?Assessment/Plan  ?  ?PT Assessment Patient needs continued PT services  ?PT Problem List Decreased strength;Decreased activity tolerance;Decreased mobility;Decreased balance;Decreased cognition;Pain ? ?   ?  ?PT Treatment Interventions DME instruction;Gait training;Stair training;Functional mobility training;Therapeutic activities;Therapeutic exercise;Balance training;Patient/family education   ? ?PT Goals (Current goals can be found in the Care Plan section)  ?Acute Rehab PT Goals ?Patient Stated Goal: agreeable to rehab ?PT Goal  Formulation: With patient/family ?Time For Goal Achievement: 07/31/21 ?Potential to Achieve Goals: Good ? ?  ?Frequency Min 3X/week ?  ? ? ?Co-evaluation   ?  ?  ?  ?  ? ? ?  ?AM-PAC PT "6 Clicks" Mobility  ?Outcome Measure Help needed turning from your back to your side while in a flat bed without using bedrails?: A Little ?Help needed moving from lying on your back to sitting on the side of a flat bed without using bedrails?: A Little ?Help needed moving to and from a bed to a chair (including a wheelchair)?: A Little ?Help needed standing up from a chair using your arms (e.g., wheelchair or bedside chair)?: A Little ?Help needed to walk in hospital room?: A Lot ?Help needed climbing 3-5 steps with a railing? : Total ?6 Click Score: 15 ? ?  ?End of Session Equipment Utilized During Treatment: Gait belt;Oxygen ?Activity Tolerance: Patient limited by pain ?Patient left: in chair;with call bell/phone within reach;with chair alarm set;with family/visitor present ?Nurse Communication: Mobility status ?PT Visit Diagnosis: Unsteadiness on feet (R26.81);Muscle weakness (generalized) (M62.81);Difficulty in walking, not elsewhere classified (R26.2);Pain ?Pain - part of body:  (neck) ?  ? ?Time: 1505-6979 ?PT Time Calculation (min) (ACUTE ONLY): 20 min ? ? ?Charges:   PT Evaluation ?$PT Eval Moderate Complexity: 1 Mod ?  ?  ?   ? ? ?Wyona Almas, PT, DPT ?Acute Rehabilitation Services ?Pager [phone] ?Office [phone] ? ? ?Deno Etienne ?07/17/2021, 3:24 PM ? ?

## 2021-07-17 NOTE — ED Notes (Signed)
IV team at pt bedside 

## 2021-07-17 NOTE — Evaluation (Signed)
Occupational Therapy Evaluation ?Patient Details ?Name: [name] [name] ?MRN: [id number] ?DOB: 11-20-1950 ?Today's Date: 07/17/2021 ? ? ?History of Present Illness 71 year old female, comes into the hospital with lightheadedness, weakness, dizziness progressively worse over the past week.  Symptoms are worse whenever she gets up and starts moving around.  She also been having nausea, vomiting and poor p.o. intake over the last week. Dx: severe sepsis 2/2 UTI and falls at home.   PMHx: HIV, CAD, pulmonary fibrosis with chronic respiratory failure  ? ?Clinical Impression ?  ?Patient demonstrates decreased overall weakness resulting in min A for supine>sit, min guard for sit to stand from EOB, min guard/min a for transfer to [facility] using RW and verbal cues for overall safety.  Patient able to complete clothing mgmt with min guard and standing peri hygiene with min guard.  Patient would benefit fom additional OT intervention and possible SNF placement for additional rehav 2/2 decreased independence in ADLs, activity tolerance and general weakness in order for patient to return to PLOF. ?   ? ?Recommendations for follow up therapy are one component of a multi-disciplinary discharge planning process, led by the attending physician.  Recommendations may be updated based on patient status, additional functional criteria and insurance authorization.  ? ?Follow Up Recommendations ? Skilled nursing-short term rehab (<3 hours/day)  ?  ?Assistance Recommended at Discharge Intermittent Supervision/Assistance  ?Patient can return home with the following Assistance with cooking/housework;A little help with walking and/or transfers ? ?  ?Functional Status Assessment ? Patient has had a recent decline in their functional status and demonstrates the ability to make significant improvements in function in a reasonable and predictable amount of time.  ?Equipment Recommendations ? BSC/3in1;Tub/shower bench  ?  ?Recommendations for Other Services    ? ? ?  ?Precautions / Restrictions Precautions ?Precautions: Fall ?Restrictions ?Weight Bearing Restrictions: No  ? ?  ? ?Mobility Bed Mobility ?Overal bed mobility: Needs Assistance ?Bed Mobility: Supine to Sit ?  ?  ?Supine to sit: Min assist ?  ?  ?  ?  ? ?Transfers ?Overall transfer level: Needs assistance ?Equipment used: Rollator (4 wheels) ?Transfers: Bed to chair/wheelchair/BSC, Sit to/from Stand ?Sit to Stand: Min guard ?Stand pivot transfers: Min guard, Min assist ?  ?  ?  ?  ?  ?  ? ?  ?Balance Overall balance assessment: Needs assistance ?Sitting-balance support: No upper extremity supported ?  ?  ?  ?Standing balance support: Bilateral upper extremity supported ?  ?  ?  ?  ?  ?  ?  ?  ?  ?  ?  ?  ?  ?   ? ?ADL either performed or assessed with clinical judgement  ? ?ADL Overall ADL's : Needs assistance/impaired ?Eating/Feeding: Independent ?  ?Grooming: Wash/dry hands;Cueing for safety;Standing ?  ?Upper Body Bathing: Supervision/ safety;Sitting ?  ?Lower Body Bathing: Minimal assistance ?  ?Upper Body Dressing : Minimal assistance;Sitting ?  ?Lower Body Dressing: Moderate assistance;Sit to/from stand ?  ?Toilet Transfer: Minimal assistance;BSC/3in1;Rolling walker (2 wheels) ?  ?Toileting- Clothing Manipulation and Hygiene: Minimal assistance;Min guard;Sit to/from stand ?  ?  ?  ?Functional mobility during ADLs: Min guard;Rolling walker (2 wheels) ?   ? ? ? ?Vision Baseline Vision/History: 1 Wears glasses ?Patient Visual Report: No change from baseline ?   ?   ?Perception   ?  ?Praxis   ?  ? ?Pertinent Vitals/Pain Pain Assessment ?Pain Assessment: 0-10 ?Pain Score: 6  ?Pain Location: stomach ?Pain Descriptors / Indicators: Aching ?  Pain Intervention(s): Patient requesting pain meds-RN notified  ? ? ? ?Hand Dominance Right ?  ?Extremity/Trunk Assessment Upper Extremity Assessment ?Upper Extremity Assessment: Generalized weakness ?  ?Lower Extremity Assessment ?Lower Extremity Assessment: Defer to PT  evaluation ?  ?  ?  ?Communication Communication ?Communication: No difficulties ?  ?Cognition Arousal/Alertness: Awake/alert ?Behavior During Therapy: [facility] for tasks assessed/performed ?Overall Cognitive Status: Within Functional Limits for tasks assessed ?  ?  ?  ?  ?  ?  ?  ?  ?  ?  ?  ?  ?  ?  ?  ?  ?  ?  ?  ?General Comments    ? ?  ?Exercises   ?  ?Shoulder Instructions    ? ? ?Home Living Family/patient expects to be discharged to:: Private residence (2nd story apartment) ?Living Arrangements: Children (lives with daughter) ?Available Help at Discharge: Family;Available PRN/intermittently ?Type of Home: Apartment ?Home Access: Stairs to enter ?Entrance Stairs-Number of Steps: 14 ?  ?Home Layout: One level ?  ?  ?Bathroom Shower/Tub: Tub/shower unit ?  ?Bathroom Toilet: Standard ?  ?  ?Home Equipment: Conservation officer, nature (2 wheels);Cane - single point ?  ?  ?  ? ?  ?Prior Functioning/Environment Prior Level of Function : Independent/Modified Independent ?  ?  ?  ?  ?  ?  ?Mobility Comments: Reports no difficulty with flight of steps to enter her home prior to onset of feeling bad ?ADLs Comments: Independent with ADLs, daughter takes care of IADLs mostly ?  ? ?  ?  ?OT Problem List: Decreased strength;Decreased activity tolerance;Decreased safety awareness ?  ?   ?OT Treatment/Interventions: Self-care/ADL training;Therapeutic exercise;Neuromuscular education;DME and/or AE instruction;Therapeutic activities;Patient/family education  ?  ?OT Goals(Current goals can be found in the care plan section) Acute Rehab OT Goals ?OT Goal Formulation: With patient ?Time For Goal Achievement: 07/26/21 ?Potential to Achieve Goals: Good  ?OT Frequency: Min 2X/week ?  ? ?Co-evaluation   ?  ?  ?  ?  ? ?  ?AM-PAC OT "6 Clicks" Daily Activity     ?Outcome Measure Help from another person eating meals?: None ?Help from another person taking care of personal grooming?: A Little ?Help from another person toileting, which includes using  toliet, bedpan, or urinal?: A Little ?Help from another person bathing (including washing, rinsing, drying)?: A Little ?Help from another person to put on and taking off regular upper body clothing?: A Little ?Help from another person to put on and taking off regular lower body clothing?: A Lot ?6 Click Score: 18 ?  ?End of Session Equipment Utilized During Treatment: Gait belt;Rolling walker (2 wheels) ?Nurse Communication: Mobility status ? ?Activity Tolerance: Patient tolerated treatment well ?Patient left: in bed;with bed alarm set;with call bell/phone within reach ? ?OT Visit Diagnosis: Muscle weakness (generalized) (M62.81)  ?              ?Time: 2440-1027 ?OT Time Calculation (min): 29 min ?Charges:  OT General Charges ?$OT Visit: 1 Visit ?OT Evaluation ?$OT Eval Moderate Complexity: 1 Mod ?OT Treatments ?$Self Care/Home Management : 8-22 mins ? ?Mervyn Skeeters, OTR/L ? ?Hadley Pen ?07/17/2021, 12:45 PM ?

## 2021-07-17 NOTE — Assessment & Plan Note (Signed)
   #)   Hyperlipidemia: documented h/o such. On high intensity atorvastatin as outpatient.    Plan: continue home statin.      

## 2021-07-17 NOTE — Assessment & Plan Note (Signed)
? ?#)   Chronic hypoxic respiratory failure due to pulmonary fibrosis: Baseline supplemental oxygen requirements noted to be 2 L continuous nasal cannula, upon which patient is maintaining oxygen saturations in the mid to high 90s, without clinical evidence to suggest acute exacerbation thereof.  Of note, chest x-ray demonstrates chronic interstitial lung disease, without evidence of acute cardiopulmonary process, including no evidence of acute infiltrate to suggest underlying pneumonia.  Outpatient respiratory regimen includes scheduled Symbicort as well as as needed albuterol. ? ?Plan: Continue home scheduled Symbicort and as needed albuterol inhaler.  Add on serum magnesium level. ? ? ? ?

## 2021-07-17 NOTE — Progress Notes (Addendum)
Care started prior to midnight in the emergency room and patient was admitted early this morning after midnight by Dr. Babs Bertin and I am in agreement with his assessment and plan.  Additional changes to the plan of care been made accordingly.  The patient is an overweight 71 year old Caucasian female with a past medical history significant for but not limited to chronic respiratory failure with hypoxia on continuous 2 L supplemental oxygen via nasal cannula in the setting of pulmonary fibrosis, hyperlipidemia, HIV positive from a needlestick in 1993, history of CAD, history of GERD and other comorbidities who presented to the ED with recurrent falls given that she had experienced 2 ground-level mechanical falls in the last few days without loss of consciousness.  Patient is on dual antiplatelet therapy with aspirin and Plavix given her CAD and states that she has been more weak for last 2 to 3 days.  She also noted subjective chills and fevers.  She denies any dysuria or any gross hematuria and further work-up revealed in the ED that she had a acute urinary tract infection.  Initial lactate level is elevated at 2.8 and then trended down to 1.2.  Urine culture and blood cultures were done and patient was given IV fluid hydration.  She was admitted with a working diagnosis of severe sepsis secondary to UTI with resultant falls.  In the ED she is given IV ceftriaxone and 1 L normal saline bolus.  Currently she is being admitted and treated for the following but not limited to: ? ?Severe sepsis secondary to urinary tract infection ?-She has had new onset subjective fevers, chills generalized weakness, urinalysis in the setting of UTI with a urinalysis showing a cloudy appearance with amber-colored urine, 5 ketones, trace leukocytes, negative nitrites, few bacteria, greater than 50 WBCs ?-WBC on admission was 19.5 and trended down to 13.5 ?-She met SIRS criteria with leukocytosis, tachycardia, tachypnea and  elevated lactic acid level of 2.8 on admission ?-Did not receive the 30 mg/kg IV fluid bolus ?-Blood cultures x2 have been initiated and follow-up on them as well as the urine culture ?-Procalcitonin level is being checked and was 0.15 and she is continue on maintenance IV fluid hydration with lactated Ringer's at 75 MLS per hour for 8 hours however since this time will resume for another 24-hour ?-Continue monitor and adjust antibiotics as necessary ? ?Generalized Weakness ?Elevated CK ?-In setting of dehydration and UTI ?-Likely a significant contributor related to her multiple ground level mechanical fall to last few days ?-Continue treatment for UTI ?-CK was elevated at 396 and likely in the setting of her recurrent falls ?-Continue IV fluid hydration as above ?-Fall precaution ?-PT and OT to further evaluate and treat; OT has evaluated and recommending SNF ? ?Chronic respiratory failure with hypoxia in the setting of pulmonary fibrosis ?-Baseline O2 is 2 L ?-Currently does not appear to be in exacerbation ?-Chest x-ray does demonstrate chronic interstitial lung disease without any evidence of any acute cardiopulmonary process including no evidence of acute infiltrates to suggest underlying pneumonia ?-She takes Symbicort as well as an albuterol inhaler which will be continued but will substitute Symbicort for Dulera 2 puffs IH twice daily ?-SpO2: 97 % ?O2 Flow Rate (L/min): 2 L/min ?-Continuous pulse oximetry maintain O2 saturation greater than 90% ?-Continue supplemental oxygen via nasal cannula and wean to home regimen ?-She will need an amatory home O2 screen and a repeat chest x-ray in the a.m. ? ?Hyperlipidemia ?-Continue with atorvastatin 80 mg p.o.  daily ? ?GERD/GI prophylaxis ?-Continue with pantoprazole 40 mg p.o. daily ? ?Elevated AST ?-AST was noted to be 79 at admission and then trended down to 71 ?-Continue monitor and trend and if necessary will obtain a right upper quadrant ultrasound as well as  an acute hepatitis panel ?-Repeat CMP in the a.m. ? ?CAD ?-Continue with aspirin 81 mg p.o. daily, atorvastatin 80 mg p.o. nightly, clopidogrel 75 mg p.o. daily, ranolazine 5 mg p.o. twice daily ? ?HIV ?-She is HIV positive and this stems from a needlestick in 1993.  Per her most recent chart review her CD count was 630 in December 2022 and in May 2022 her viral load was noted to be detectable but low ?-Continue with dolutegravir-lamivudine 50-300 mg per tab 1 tab p.o. daily ? ?**Addendum: Called Daughter Stephanie at 15:40 to provide update but she did not pick up the phone. ? ?We will continue to monitor the patient's clinical response to intervention and repeat blood work and imaging in the a.m. ?

## 2021-07-17 NOTE — Assessment & Plan Note (Signed)
-   see severe sepsis 

## 2021-07-17 NOTE — Assessment & Plan Note (Signed)
? ?#)  Severe sepsis due to urinary tract infection: Diagnosis on the basis of 2 to 3 days of new onset subjective fever, chills, generalized weakness, with urinalysis suggestive of UTI in the setting of significant pyuria without corresponding evidence of squamous epithelial cells.  ? ?SIRS criteria met via leukocytosis, tachycardia, tachypnea. Lactic acid level: Initially elevated at 2.8, with repeat value trending down to 1.2 following interval 1 L normal saline bolus. Of note, given the associated presence of suspected end organ damage in the form of concominant presenting elevated lactate, criteria are met for pt's sepsis to be considered severe in nature. However, in the absence of lactic acid level that is greater than or equal to 4.0, and in the absence of any associated hypotension refractory to IVF's, there are no indications for administration of a 30 mL/kg IVF bolus at this time.  ? ?Additional ED work-up/management notable for: Collection of blood cultures x2 as well as urine culture prior to initiation of Rocephin, which will be continued for management of UTI. ? ?No e/o additional infectious process at this time, including chest x-ray which showed no evidence of acute cardiopulmonary process. ? ? ? ?Plan: CBC w/ diff and CMP in AM.  Follow for results of blood cx's x 2 as well as urine culture. Abx: Continue Rocephin.  Add on procalcitonin level.  Lactated Ringer's at 75 cc/h x 8 hours. ? ? ?

## 2021-07-18 ENCOUNTER — Inpatient Hospital Stay (HOSPITAL_COMMUNITY): Payer: Medicare Other

## 2021-07-18 DIAGNOSIS — E785 Hyperlipidemia, unspecified: Secondary | ICD-10-CM | POA: Diagnosis not present

## 2021-07-18 DIAGNOSIS — K5900 Constipation, unspecified: Secondary | ICD-10-CM

## 2021-07-18 DIAGNOSIS — A419 Sepsis, unspecified organism: Secondary | ICD-10-CM | POA: Diagnosis not present

## 2021-07-18 DIAGNOSIS — J9611 Chronic respiratory failure with hypoxia: Secondary | ICD-10-CM | POA: Diagnosis not present

## 2021-07-18 DIAGNOSIS — R531 Weakness: Secondary | ICD-10-CM | POA: Diagnosis not present

## 2021-07-18 LAB — CBC WITH DIFFERENTIAL/PLATELET
Abs Immature Granulocytes: 0.05 10*3/uL (ref 0.00–0.07)
Basophils Absolute: 0 10*3/uL (ref 0.0–0.1)
Basophils Relative: 0 %
Eosinophils Absolute: 0.1 10*3/uL (ref 0.0–0.5)
Eosinophils Relative: 1 %
HCT: 36.2 % (ref 36.0–46.0)
Hemoglobin: 11.6 g/dL — ABNORMAL LOW (ref 12.0–15.0)
Immature Granulocytes: 1 %
Lymphocytes Relative: 17 %
Lymphs Abs: 1.5 10*3/uL (ref 0.7–4.0)
MCH: 32.4 pg (ref 26.0–34.0)
MCHC: 32 g/dL (ref 30.0–36.0)
MCV: 101.1 fL — ABNORMAL HIGH (ref 80.0–100.0)
Monocytes Absolute: 1.2 10*3/uL — ABNORMAL HIGH (ref 0.1–1.0)
Monocytes Relative: 14 %
Neutro Abs: 5.8 10*3/uL (ref 1.7–7.7)
Neutrophils Relative %: 67 %
Platelets: 193 10*3/uL (ref 150–400)
RBC: 3.58 MIL/uL — ABNORMAL LOW (ref 3.87–5.11)
RDW: 16.1 % — ABNORMAL HIGH (ref 11.5–15.5)
WBC: 8.8 10*3/uL (ref 4.0–10.5)
nRBC: 0 % (ref 0.0–0.2)

## 2021-07-18 LAB — COMPREHENSIVE METABOLIC PANEL
ALT: 18 U/L (ref 0–44)
AST: 70 U/L — ABNORMAL HIGH (ref 15–41)
Albumin: 2.3 g/dL — ABNORMAL LOW (ref 3.5–5.0)
Alkaline Phosphatase: 50 U/L (ref 38–126)
Anion gap: 8 (ref 5–15)
BUN: 14 mg/dL (ref 8–23)
CO2: 19 mmol/L — ABNORMAL LOW (ref 22–32)
Calcium: 8.1 mg/dL — ABNORMAL LOW (ref 8.9–10.3)
Chloride: 110 mmol/L (ref 98–111)
Creatinine, Ser: 0.53 mg/dL (ref 0.44–1.00)
GFR, Estimated: >60 mL/min (ref >60)
Glucose, Bld: 91 mg/dL (ref 70–99)
Potassium: 3.9 mmol/L (ref 3.5–5.1)
Sodium: 137 mmol/L (ref 135–145)
Total Bilirubin: 1 mg/dL (ref 0.3–1.2)
Total Protein: 4.6 g/dL — ABNORMAL LOW (ref 6.5–8.1)

## 2021-07-18 LAB — MAGNESIUM: Magnesium: 2.1 mg/dL (ref 1.7–2.4)

## 2021-07-18 LAB — CALCIUM, IONIZED: Calcium, Ionized, Serum: 4.7 mg/dL (ref 4.5–5.6)

## 2021-07-18 LAB — PHOSPHORUS: Phosphorus: 3.4 mg/dL (ref 2.5–4.6)

## 2021-07-18 MED ORDER — BISACODYL 10 MG RE SUPP: 10.0000 mg | Freq: Every day | RECTAL | Status: DC | PRN

## 2021-07-18 MED ORDER — POLYETHYLENE GLYCOL 3350 17 G PO PACK
17.0000 g | PACK | Freq: Two times a day (BID) | ORAL | Status: DC
Start: 2021-07-18 — End: 2021-07-20
  Administered 2021-07-18 – 2021-07-19 (×4): 17 g via ORAL
  Filled 2021-07-18 (×4): qty 1

## 2021-07-18 MED ORDER — SENNOSIDES-DOCUSATE SODIUM 8.6-50 MG PO TABS
1.0000 | ORAL_TABLET | Freq: Two times a day (BID) | ORAL | Status: DC
  Administered 2021-07-18 – 2021-07-20 (×5): 1 via ORAL
  Filled 2021-07-18 (×5): qty 1

## 2021-07-18 MED ORDER — IOHEXOL 9 MG/ML PO SOLN
ORAL | Status: AC
  Filled 2021-07-18: qty 1000

## 2021-07-18 MED ORDER — IOHEXOL 300 MG/ML  SOLN
100.0000 mL | Freq: Once | INTRAMUSCULAR | Status: AC | PRN
  Administered 2021-07-18: 100 mL via INTRAVENOUS

## 2021-07-18 NOTE — TOC Initial Note (Addendum)
Transition of Care (TOC) - Initial/Assessment Note  ? ? ?Patient Details  ?Name: [name] [name] ?MRN: [id number] ?Date of Birth: June 21, 1950 ? ?Transition of Care (TOC) CM/SW Contact:    ?Milas Gain, LCSWA ?Phone Number: ?07/18/2021, 1:39 PM ? ?Clinical Narrative:        ? ?Update 4:48pm- CSW provided patient with medicare compare SNF bed offers with accepted SNF bed offers to review.  Patient informed CSW she is interested in CIR. CSW informed patient she will reach out to CIR to assess. Patient agreeable to SNF as backup plan to CIR. Patient is going to review SNF bed offers. CSW to follow up with SNF choice from patient in the morning. CSW called and LVM for [facility] with CIR. CSW awaiting callback.  ? ?Update- CSW started insurance authorization for patient. Reference # [id number]. Pending SNF choice. Pending insurance authorization.          ? ?CSW received consult for possible SNF placement at time of discharge. CSW spoke with patient at bedside regarding PT recommendation of SNF placement at time of discharge. Patient reports she comes from home with daughter and grandson. Patient expressed understanding of PT recommendation and is agreeable to SNF placement at time of discharge. Patient gave CSW permission to fax out initial referral near the [place] area.CSW discussed insurance authorization process with patient and will provide Medicare compare SNF ratings list with accepted SNF bed offers when available. Patient reports she  has received the COVID vaccines. Patient gave CSW permission to speak with her daughter [name] regarding dc plan. CSW updated patients daughter on patients dc plan.Patient expressed being hopeful for rehab and to feel better soon. No further questions reported at this time. CSW to continue to follow and assist with discharge planning needs.  ? ?Expected Discharge Plan: [place] ?Barriers to Discharge: Continued Medical Work up ? ? ?Patient Goals and CMS Choice ?Patient  states their goals for this hospitalization and ongoing recovery are:: SNF ?CMS Medicare.gov Compare Post Acute Care list provided to:: Patient ?Choice offered to / list presented to : Patient ? ?Expected Discharge Plan and Services ?Expected Discharge Plan: [place] ?In-house Referral: Clinical Social Work ?  ?  ?Living arrangements for the past 2 months: [place] ?                ?  ?  ?  ?  ?  ?  ?  ?  ?  ?  ? ?Prior Living Arrangements/Services ?Living arrangements for the past 2 months: [place] ?Lives with:: Adult Children, Other (Comment) (Lives with her Daughter and Yolanda Bonine) ?Patient language and need for interpreter reviewed:: Yes ?Do you feel safe going back to the place where you live?: No   SNF  ?Need for Family Participation in Patient Care: Yes (Comment) ?Care giver support system in place?: Yes (comment) ?  ?Criminal Activity/Legal Involvement Pertinent to Current Situation/Hospitalization: No - Comment as needed ? ?Activities of Daily Living ?Home Assistive Devices/Equipment: Gilford Rile (specify type) ?ADL Screening (condition at time of admission) ?Patient's cognitive ability adequate to safely complete daily activities?: Yes ?Is the patient deaf or have difficulty hearing?: No ?Does the patient have difficulty seeing, even when wearing glasses/contacts?: No ?Does the patient have difficulty concentrating, remembering, or making decisions?: No ?Patient able to express need for assistance with ADLs?: Yes ?Does the patient have difficulty dressing or bathing?: Yes ?Independently performs ADLs?: Yes (appropriate for developmental age) ?Does the patient have difficulty walking or climbing  stairs?: Yes ?Weakness of Legs: None ?Weakness of Arms/Hands: None ? ?Permission Sought/Granted ?Permission sought to share information with : Case Manager, Family Supports, Customer service manager ?Permission granted to share information with : Yes, Verbal Permission  Granted ? Share Information with NAME: [name] ? Permission granted to share info w AGENCY: SNF ? Permission granted to share info w Relationship: daughter ? Permission granted to share info w Contact Information: [name] [phone] ? ?Emotional Assessment ?Appearance:: Appears stated age ?Attitude/Demeanor/Rapport: Gracious ?Affect (typically observed): Calm ?Orientation: : Oriented to Self, Oriented to Place, Oriented to  Time, Oriented to Situation (WDL) ?Alcohol / Substance Use: Not Applicable ?Psych Involvement: No (comment) ? ?Admission diagnosis:  Acute cystitis without hematuria [N30.00] ?Severe sepsis ([place]) [A41.9, R65.20] ?Patient Active Problem List  ? Diagnosis Date Noted  ? Severe sepsis ([place]) 07/17/2021  ? Acute cystitis 07/17/2021  ? Generalized weakness 07/17/2021  ? HLD (hyperlipidemia) 07/17/2021  ? Vitamin B12 deficiency 06/12/2021  ? Macrocytic anemia 06/12/2021  ? Hypotension 06/12/2021  ? Orthostatic hypotension near syncope 06/11/2021  ? Elevated AST (SGOT) 06/11/2021  ? Chronic respiratory failure with hypoxia ([place]) pulmonary fibrosis asthma 06/11/2021  ? Near syncope 06/11/2021  ? Intractable nausea and vomiting 07/05/2019  ? Nausea & vomiting abdominal pain 09/04/2018  ? E. coli UTI 09/04/2018  ? Nausea and/or vomiting 09/04/2018  ? Influenza due to identified novel influenza A virus with other respiratory manifestations 04/22/2018  ? Hypokalemia 04/22/2018  ? HIV (human immunodeficiency virus infection) ([place]) 04/21/2018  ? Sepsis ([place]) 04/21/2018  ? Abdominal pain 04/21/2018  ? Asthma exacerbation 04/21/2018  ? Anxiety state 03/17/2016  ? Benign neoplasm of colon 03/17/2016  ? Human immunodeficiency virus (HIV) disease ([place]) 03/17/2016  ? Nontoxic uninodular goiter 03/17/2016  ? Other premature beats 03/17/2016  ? Rectocele 03/17/2016  ? Symptomatic bradycardia 03/17/2016  ? Atypical chest pain 03/17/2016  ? Lower extremity edema 10/13/2015  ? Localized acrodermatitis continua of  [place] 06/29/2015  ? Cervical pain (neck) 06/14/2015  ? Dysphagia 12/14/2014  ? IPF (idiopathic pulmonary fibrosis) ([place]) 07/06/2014  ? Paronychia 06/16/2014  ? Bone pain 08/25/2013  ? Lumbar radicular pain 08/25/2013  ? Allergic rhinitis 07/14/2013  ? Post-operative state 03/25/2013  ? Preop cardiovascular exam 12/24/2012  ? Ejection fraction   ? Cystocele 12/11/2012  ? Insomnia 10/23/2012  ? Vaginal prolapse 04/02/2012  ? Urinary incontinence 02/28/2012  ? Other benign neoplasm of connective and other soft tissue of lower limb, including hip 09/08/2011  ? Osteoporosis 03/20/2011  ? Closed fracture of sacrum ([place]) 12/23/2010  ? HIV positive ([place])   ? CAD (coronary artery disease)   ? Palpitations   ? Dyslipidemia   ? Persistent asthma   ? Peptic ulcer disease   ? GERD (gastroesophageal reflux disease)   ? Bradycardia   ? Shortness of breath   ? CONSTIPATION 07/22/2007  ? WEIGHT LOSS-ABNORMAL 07/22/2007  ? ABDOMINAL PAIN-GENERALIZED 07/22/2007  ? ?PCP:  [place] ?Pharmacy:   ?[id number] [place],  - [address] ?[id number]?Phone: [phone]: [phone]? ?[place] ?[place]?Phone: [phone]: [phone]? ?[facility] ?[address]?Phone: [phone]: [phone] ? ? ? ? ?Social Determinants of Health (SDOH) Interventions ?  ? ?Readmission Risk Interventions ?   ? View :  No data to display.  ?  ?  ?  ? ? ? ?

## 2021-07-18 NOTE — Progress Notes (Signed)
[facility]) Hospital Liaison note: ? ?This is a pending outpatient-based Palliative Care patient. Will continue to follow for disposition. ? ?Please call with any outpatient palliative questions or concerns. ? ?Thank you, ?Lorelee Market, LPN ?[facility] Hospital Liaison ?[phone] ?

## 2021-07-18 NOTE — Progress Notes (Signed)
?PROGRESS NOTE ? ? ? ?[name] [name]  AJG:[id number] DOB: 10-27-1950 DOA: 07/16/2021 ?PCP: [place]  ? ?Brief Narrative:  ? The patient is an overweight 71 year old Caucasian female with a past medical history significant for but not limited to chronic respiratory failure with hypoxia on continuous 2 L supplemental oxygen via nasal cannula in the setting of pulmonary fibrosis, hyperlipidemia, HIV positive from a needlestick in 1993, history of CAD, history of GERD and other comorbidities who presented to the ED with recurrent falls given that she had experienced 2 ground-level mechanical falls in the last few days without loss of consciousness.  Patient is on dual antiplatelet therapy with aspirin and Plavix given her CAD and states that she has been more weak for last 2 to 3 days.  She also noted subjective chills and fevers.  She denies any dysuria or any gross hematuria and further work-up revealed in the ED that she had a acute urinary tract infection.  Initial lactate level is elevated at 2.8 and then trended down to 1.2.  Urine culture and blood cultures were done and patient was given IV fluid hydration.  She was admitted with a working diagnosis of severe sepsis secondary to UTI with resultant falls.  In the ED she is given IV ceftriaxone and 1 L normal saline bolus.   ? ?Further work-up reveals that she has an E. coli UTI.  She is slowly improving but states that she is constipated and has not had a bowel movement since last month. ? ?Assessment and Plan: ? ?Severe sepsis secondary to E. coli urinary tract infection ?-She has had new onset subjective fevers, chills generalized weakness, urinalysis in the setting of UTI with a urinalysis showing a cloudy appearance with amber-colored urine, 5 ketones, trace leukocytes, negative nitrites, few bacteria, greater than 50 WBCs ?-WBC on admission was 19.5 and trended down to 13.5 ?-She met SIRS criteria with leukocytosis, tachycardia, tachypnea  and elevated lactic acid level of 2.8 on admission ?-Did not receive the 30 mg/kg IV fluid bolus ?-Blood cultures x2 have been initiated and follow-up on them as well as the urine culture ?-Blood cultures x2 showed no growth to date at 1 day ?-Urine culture showing greater than 100,000 colony-forming units of E. coli with sensitivities pending ?-Procalcitonin level is being checked and was 0.15 and she is continue on maintenance IV fluid hydration with lactated Ringer's at 75 MLS per hour for 8 hours however resumed for 24 hours and will stop today  ?-Continue monitor and adjust antibiotics as necessary ?  ?Generalized Weakness ?Elevated CK ?-In setting of dehydration and UTI ?-Likely a significant contributor related to her multiple ground level mechanical fall to last few days ?-Continue treatment for UTI ?-CK was elevated at 396 and likely in the setting of her recurrent falls ?-Continue IV fluid hydration as above ?-Fall precaution ?-TSH was 3.438 ?-PT and OT to further evaluate and treat; OT has evaluated and recommending SNF ?  ?Chronic respiratory failure with hypoxia in the setting of pulmonary fibrosis ?-Baseline O2 is 2 L ?-Currently does not appear to be in exacerbation ?-Chest x-ray does demonstrate chronic interstitial lung disease without any evidence of any acute cardiopulmonary process including no evidence of acute infiltrates to suggest underlying pneumonia ?-She takes Symbicort as well as an albuterol inhaler which will be continued but will substitute Symbicort for Dulera 2 puffs IH twice daily ?-SpO2: 97 % ?O2 Flow Rate (L/min): 2 L/min ?-Continuous pulse oximetry maintain O2 saturation  greater than 90% ?-Continue supplemental oxygen via nasal cannula and wean to home regimen ?-She will need an amatory home O2 screen and a repeat chest x-ray in the a.m. ?  ?Hyperlipidemia ?-Continue with atorvastatin 80 mg p.o. daily ? ?Metabolic Acidosis ?-CO2 is 19, AG is 8, and Chloride is 110 ?-Continue to  Monitor and Trend ?-Repeat CMP in the AM  ? ?Constipation ?-Initiated bowel regimen with senna docusate 1 tab p.o. twice daily, MiraLAX 17 g p.o. twice daily, and a bisacodyl suppository 10 mg RC daily as needed ?-States that she has not had a bowel movement since the eighth of last month ?  ?GERD/GI prophylaxis ?-Continue with pantoprazole 40 mg p.o. daily ?  ?Elevated AST ?-AST was noted to be 79 at admission and then trended down to 71 and today is now 70 ?-Continue monitor and trend and if necessary will obtain a right upper quadrant ultrasound as well as an acute hepatitis panel ?-Repeat CMP in the a.m. ? ?Hypoalbuminemia ?-Patient's albumin level went from 3.3 and trended down to 2.7 is now 2.3 today ?-Monitor and trend and repeat CMP in a.m. ? ?Macrocytic Anemia ?-Patient's hemoglobin/hematocrit went from 14.8/45.0 -> 13.0/39.8 -> 11.6/36.2 ?-Check Anemia Panel in the AM  ?-Continue to Monitor for S/Sx of Bleeding; No overt bleeding noted ?-Repeat CBC in the AM ?  ?CAD ?-Continue with aspirin 81 mg p.o. daily, atorvastatin 80 mg p.o. nightly, clopidogrel 75 mg p.o. daily, ranolazine 5 mg p.o. twice daily ?  ?HIV ?-She is HIV positive and this stems from a needlestick in 1993.  Per her most recent chart review her CD count was 630 in December 2022 and in May 2022 her viral load was noted to be detectable but low ?-Continue with dolutegravir-lamivudine 50-300 mg per tab 1 tab p.o. daily ? ?Overweight ?-Complicates overall prognosis and care ?-Estimated body mass index is 29.88 kg/m? as calculated from the following: ?  Height as of this encounter: _0  (1.499 m). ?  Weight as of this encounter: 67.1 kg.  ?-Weight Loss and Dietary Counseling given ? ?DVT prophylaxis: SCDs Start: 07/17/21 0402 ? ?  Code Status: Full Code ?Family Communication: Discussed with Daughter over the Telephone ? ?Disposition Plan:  ?Level of care: Telemetry Medical ?Status is: Inpatient ?Remains inpatient appropriate because: Needs  further clinical improvement and PT/OT recommending SNF given recurrent falls ?  ?Consultants:  ?None ? ?Procedures:  ?None ? ?Antimicrobials:  ?Anti-infectives (From admission, onward)  ? ? Start     Dose/Rate Route Frequency Ordered Stop  ? 07/17/21 2200  cefTRIAXone (ROCEPHIN) 1 g in sodium chloride 0.9 % 100 mL IVPB       ? 1 g ?200 mL/hr over 30 Minutes Intravenous Every 24 hours 07/17/21 0403    ? 07/17/21 1000  dolutegravir-lamiVUDine (DOVATO) 50-300 MG per tablet 1 tablet       ? 1 tablet Oral Daily 07/17/21 0426    ? 07/17/21 0330  cefTRIAXone (ROCEPHIN) 1 g in sodium chloride 0.9 % 100 mL IVPB       ? 1 g ?200 mL/hr over 30 Minutes Intravenous  Once 07/17/21 0325 07/17/21 0448  ? ?  ?  ?Subjective: ?Seen and examined at bedside and states that she has not had a bowel movement in over a month.  Denies any chest pain.  States that she had some abdominal soreness.  No nausea or vomiting.  Feels okay.  Denies any other concerns or complaints at this time and denies any  chest pain or shortness of breath and is on her 2 L of supplemental oxygen via nasal cannula. ? ?Objective: ?Vitals:  ? 07/18/21 0031 07/18/21 2527 07/18/21 0848 07/18/21 0915  ?BP: 108/66 102/62  (!) 96/58  ?Pulse: 75 73  82  ?Resp: _0 ?Temp: 98.5 ?F (36.9 ?C) 99.1 ?F (37.3 ?C)  98.6 ?F (37 ?C)  ?TempSrc: Oral Oral  Oral  ?SpO2: 97% 97% 100% 97%  ?Weight:  67.1 kg    ?Height:      ? ? ?Intake/Output Summary (Last 24 hours) at 07/18/2021 1332 ?Last data filed at 07/18/2021 1313 ?Gross per 24 hour  ?Intake 1302.62 ml  ?Output 600 ml  ?Net 702.62 ml  ? ?Filed Weights  ? 07/16/21 2324 07/17/21 0612 07/18/21 0427  ?Weight: 65.8 kg 64.6 kg 67.1 kg  ? ?Examination: ?Physical Exam: ? ?Constitutional: WN/WD overweight Caucasian female currently no acute distress appears calm ordering her meal ?Respiratory: Diminished to auscultation bilaterally with coarse breath sounds, no wheezing, rales, rhonchi or crackles. Normal respiratory effort and patient  is not tachypenic. No accessory muscle use.  Wearing supplemental oxygen via nasal ?Cardiovascular: RRR, no murmurs / rubs / gallops. S1 and S2 auscultated.  Trace lower extremity edema ?Abdomen: Soft, mi

## 2021-07-18 NOTE — [place] FL2 (Signed)
?[place] MEDICAID FL2 LEVEL OF CARE SCREENING TOOL  ?  ? ?IDENTIFICATION  ?Patient Name: ?[name] [name] Birthdate: Aug 18, 1950 Sex: adult Admission Date (Current Location): ?07/16/2021  ?[place] and [place] Number: ? [place] ?  Facility and Address:  ?[facility], [place] [address] [place], [place] [address] ?     Provider Number: ?[id number]  ?Attending Physician Name and Address:  ?Raiford Noble Watson, DO ? Relative Name and Phone Number:  ?[name] [phone] ?   ?Current Level of Care: ?Hospital Recommended Level of Care: ?[place] Prior Approval Number: ?  ? ?Date Approved/Denied: ?  PASRR Number: ?[id number] ? ?Discharge Plan: ?SNF ?  ? ?Current Diagnoses: ?Patient Active Problem List  ? Diagnosis Date Noted  ? Severe sepsis ([place]) 07/17/2021  ? Acute cystitis 07/17/2021  ? Generalized weakness 07/17/2021  ? HLD (hyperlipidemia) 07/17/2021  ? Vitamin B12 deficiency 06/12/2021  ? Macrocytic anemia 06/12/2021  ? Hypotension 06/12/2021  ? Orthostatic hypotension near syncope 06/11/2021  ? Elevated AST (SGOT) 06/11/2021  ? Chronic respiratory failure with hypoxia ([place]) pulmonary fibrosis asthma 06/11/2021  ? Near syncope 06/11/2021  ? Intractable nausea and vomiting 07/05/2019  ? Nausea & vomiting abdominal pain 09/04/2018  ? E. coli UTI 09/04/2018  ? Nausea and/or vomiting 09/04/2018  ? Influenza due to identified novel influenza A virus with other respiratory manifestations 04/22/2018  ? Hypokalemia 04/22/2018  ? HIV (human immunodeficiency virus infection) ([place]) 04/21/2018  ? Sepsis ([place]) 04/21/2018  ? Abdominal pain 04/21/2018  ? Asthma exacerbation 04/21/2018  ? Anxiety state 03/17/2016  ? Benign neoplasm of colon 03/17/2016  ? Human immunodeficiency virus (HIV) disease ([place]) 03/17/2016  ? Nontoxic uninodular goiter 03/17/2016  ? Other premature beats 03/17/2016  ? Rectocele 03/17/2016  ? Symptomatic bradycardia 03/17/2016  ? Atypical chest pain 03/17/2016  ? Lower  extremity edema 10/13/2015  ? Localized acrodermatitis continua of [place] 06/29/2015  ? Cervical pain (neck) 06/14/2015  ? Dysphagia 12/14/2014  ? IPF (idiopathic pulmonary fibrosis) ([place]) 07/06/2014  ? Paronychia 06/16/2014  ? Bone pain 08/25/2013  ? Lumbar radicular pain 08/25/2013  ? Allergic rhinitis 07/14/2013  ? Post-operative state 03/25/2013  ? Preop cardiovascular exam 12/24/2012  ? Ejection fraction   ? Cystocele 12/11/2012  ? Insomnia 10/23/2012  ? Vaginal prolapse 04/02/2012  ? Urinary incontinence 02/28/2012  ? Other benign neoplasm of connective and other soft tissue of lower limb, including hip 09/08/2011  ? Osteoporosis 03/20/2011  ? Closed fracture of sacrum ([place]) 12/23/2010  ? HIV positive ([place])   ? CAD (coronary artery disease)   ? Palpitations   ? Dyslipidemia   ? Persistent asthma   ? Peptic ulcer disease   ? GERD (gastroesophageal reflux disease)   ? Bradycardia   ? Shortness of breath   ? CONSTIPATION 07/22/2007  ? WEIGHT LOSS-ABNORMAL 07/22/2007  ? ABDOMINAL PAIN-GENERALIZED 07/22/2007  ? ? ?Orientation RESPIRATION BLADDER Height & Weight   ?  ?Self, Time, Situation, Place (WDL) ? O2 (Nasal Cannula 2 liters) Incontinent, External catheter (External Urinary Catheter) Weight: 147 lb 14.9 oz (67.1 kg) ?Height:  '4\' 11"'$  (149.9 cm)  ?BEHAVIORAL SYMPTOMS/MOOD NEUROLOGICAL BOWEL NUTRITION STATUS  ?    Continent (WDL) Diet (Please see discharge summary)  ?AMBULATORY STATUS COMMUNICATION OF NEEDS Skin   ?Limited Assist Verbally Other (Comment) (Appropriate for ethnicity,dry, non-tenting) ?  ?  ?  ?    ?     ?     ? ? ?Personal Care Assistance Level of Assistance  ?  Bathing, Feeding, Dressing Bathing Assistance: Limited assistance ?Feeding assistance: Independent (able to feed self) ?Dressing Assistance: Limited assistance ?   ? ?Functional Limitations Info  ?Sight, Hearing, Speech Sight Info: Adequate ?Hearing Info: Adequate ?Speech Info: Adequate  ? ? ?SPECIAL CARE FACTORS FREQUENCY  ?PT (By  licensed PT), OT (By licensed OT)   ?  ?PT Frequency: 5x min weekly ?OT Frequency: 5x min weekly ?  ?  ?  ?   ? ? ?Contractures Contractures Info: Not present  ? ? ?Additional Factors Info  ?Code Status, Allergies Code Status Info: FULL ?Allergies Info: Isosorbide Nitrate,Sulfamethoxazole-trimethoprim ?  ?  ?  ?   ? ?Current Medications (07/18/2021):  This is the current hospital active medication list ?Current Facility-Administered Medications  ?Medication Dose Route Frequency Provider Last Rate Last Admin  ? acetaminophen (TYLENOL) tablet 650 mg  650 mg Oral Q6H PRN Howerter, Justin B, DO      ? Or  ? acetaminophen (TYLENOL) suppository 650 mg  650 mg Rectal Q6H PRN Howerter, Justin B, DO      ? albuterol (PROVENTIL) (2.5 MG/3ML) 0.083% nebulizer solution 3 mL  3 mL Inhalation Q6H PRN Howerter, Justin B, DO      ? aspirin EC tablet 81 mg  81 mg Oral Daily Howerter, Justin B, DO   81 mg at 07/18/21 0929  ? atorvastatin (LIPITOR) tablet 80 mg  80 mg Oral q1800 Howerter, Justin B, DO   80 mg at 07/17/21 1725  ? bisacodyl (DULCOLAX) suppository 10 mg  10 mg Rectal Daily PRN Raiford Noble Latif, DO      ? cefTRIAXone (ROCEPHIN) 1 g in sodium chloride 0.9 % 100 mL IVPB  1 g Intravenous Q24H Howerter, Justin B, DO 200 mL/hr at 07/17/21 2239 1 g at 07/17/21 2239  ? clopidogrel (PLAVIX) tablet 75 mg  75 mg Oral Daily Howerter, Justin B, DO   75 mg at 07/18/21 0930  ? dolutegravir-lamiVUDine (DOVATO) 50-300 MG per tablet 1 tablet  1 tablet Oral Daily Howerter, Justin B, DO   1 tablet at 07/18/21 0929  ? lactated ringers infusion   Intravenous Continuous Raiford Noble [place], [place] 75 mL/hr at 07/18/21 0703 New Bag at 07/18/21 0703  ? midodrine (PROAMATINE) tablet 10 mg  10 mg Oral BID WC Howerter, Justin B, DO   10 mg at 07/18/21 0929  ? mometasone-formoterol (DULERA) 200-5 MCG/ACT inhaler 2 puff  2 puff Inhalation BID Howerter, Justin B, DO   2 puff at 07/18/21 0847  ? ondansetron (ZOFRAN) injection 4 mg  4 mg Intravenous Q6H  PRN Shalhoub, Sherryll Burger, MD   4 mg at 07/18/21 1303  ? pantoprazole (PROTONIX) EC tablet 40 mg  40 mg Oral Daily Howerter, Justin B, DO   40 mg at 07/18/21 4097  ? polyethylene glycol (MIRALAX / GLYCOLAX) packet 17 g  17 g Oral BID Raiford Noble [employer], DO   17 g at 07/18/21 1122  ? ranolazine (RANEXA) 12 hr tablet 500 mg  500 mg Oral BID Howerter, Justin B, DO   500 mg at 07/18/21 0929  ? senna-docusate (Senokot-S) tablet 1 tablet  1 tablet Oral BID Raiford Noble [place], DO   1 tablet at 07/18/21 1122  ? traZODone (DESYREL) tablet 50 mg  50 mg Oral QHS PRN Shalhoub, Sherryll Burger, MD      ? ? ? ?Discharge Medications: ?Please see discharge summary for a list of discharge medications. ? ?Relevant Imaging Results: ? ?Relevant Lab Results: ? ? ?Additional  Information ?[phone], Both Covid Vaccines ? ?Milas Gain, LCSWA ? ? ? ? ?

## 2021-07-18 NOTE — Progress Notes (Signed)
Pt complaining about pain in her abdomen 10/10. Pt hasn't moved bowel since last many days. Medicines administered to move her bowel but unsuccessful. Dr. Alfredia Ferguson made aware. New orders received.  ?

## 2021-07-18 NOTE — TOC CAGE-AID Note (Signed)
Transition of Care (TOC) - CAGE-AID Screening ? ? ?Patient Details  ?Name: [name] [name] ?MRN: [id number] ?Date of Birth: 06/06/1950 ? ?Transition of Care (TOC) CM/SW Contact:    ?Lajuane Leatham C Tarpley-Carter, LCSWA ?Phone Number: ?07/18/2021, 2:30 PM ? ? ?Clinical Narrative: ?Pt participated in [place].  Pt stated she does not use substance or ETOH.  Pt was not offered resources, due to no usage of substance or ETOH.   ? ?Passenger transport manager, MSW, LCSW-A ?Pronouns:  She/Her/Hers ?[facility] ?Clinical Social Worker ?Direct Number:  [phone] ?[email or web address] ? ? ?CAGE-AID Screening: ?  ? ?Have You Ever Felt You Ought to Cut Down on Your Drinking or Drug Use?: No ?Have People Annoyed You By Critizing Your Drinking Or Drug Use?: No ?Have You Felt Bad Or Guilty About Your Drinking Or Drug Use?: No ?Have You Ever Had a Drink or Used Drugs First Thing In The Morning to Steady Your Nerves or to Get Rid of a Hangover?: No ?CAGE-AID Score: 0 ? ?Substance Abuse Education Offered: No ? ?  ? ? ? ? ? ? ?

## 2021-07-19 LAB — CBC WITH DIFFERENTIAL/PLATELET
Abs Immature Granulocytes: 0.05 10*3/uL (ref 0.00–0.07)
Basophils Absolute: 0 10*3/uL (ref 0.0–0.1)
Basophils Relative: 1 %
Eosinophils Absolute: 0.1 10*3/uL (ref 0.0–0.5)
Eosinophils Relative: 2 %
HCT: 33.1 % — ABNORMAL LOW (ref 36.0–46.0)
Hemoglobin: 10.6 g/dL — ABNORMAL LOW (ref 12.0–15.0)
Immature Granulocytes: 1 %
Lymphocytes Relative: 21 %
Lymphs Abs: 1.3 10*3/uL (ref 0.7–4.0)
MCH: 32.2 pg (ref 26.0–34.0)
MCHC: 32 g/dL (ref 30.0–36.0)
MCV: 100.6 fL — ABNORMAL HIGH (ref 80.0–100.0)
Monocytes Absolute: 1 10*3/uL (ref 0.1–1.0)
Monocytes Relative: 15 %
Neutro Abs: 3.8 10*3/uL (ref 1.7–7.7)
Neutrophils Relative %: 60 %
Platelets: 202 10*3/uL (ref 150–400)
RBC: 3.29 MIL/uL — ABNORMAL LOW (ref 3.87–5.11)
RDW: 15.9 % — ABNORMAL HIGH (ref 11.5–15.5)
WBC: 6.3 10*3/uL (ref 4.0–10.5)
nRBC: 0 % (ref 0.0–0.2)

## 2021-07-19 LAB — COMPREHENSIVE METABOLIC PANEL
ALT: 22 U/L (ref 0–44)
AST: 60 U/L — ABNORMAL HIGH (ref 15–41)
Albumin: 2.1 g/dL — ABNORMAL LOW (ref 3.5–5.0)
Alkaline Phosphatase: 37 U/L — ABNORMAL LOW (ref 38–126)
Anion gap: 5 (ref 5–15)
BUN: 8 mg/dL (ref 8–23)
CO2: 24 mmol/L (ref 22–32)
Calcium: 8 mg/dL — ABNORMAL LOW (ref 8.9–10.3)
Chloride: 112 mmol/L — ABNORMAL HIGH (ref 98–111)
Creatinine, Ser: 0.49 mg/dL (ref 0.44–1.00)
GFR, Estimated: >60 mL/min (ref >60)
Glucose, Bld: 93 mg/dL (ref 70–99)
Potassium: 3.8 mmol/L (ref 3.5–5.1)
Sodium: 141 mmol/L (ref 135–145)
Total Bilirubin: 0.7 mg/dL (ref 0.3–1.2)
Total Protein: 4.6 g/dL — ABNORMAL LOW (ref 6.5–8.1)

## 2021-07-19 LAB — PHOSPHORUS: Phosphorus: 3.1 mg/dL (ref 2.5–4.6)

## 2021-07-19 LAB — URINE CULTURE: Culture: >=100000 — AB

## 2021-07-19 LAB — METHYLMALONIC ACID, SERUM: Methylmalonic Acid, Quantitative: 83 nmol/L (ref 0–378)

## 2021-07-19 LAB — MAGNESIUM: Magnesium: 1.7 mg/dL (ref 1.7–2.4)

## 2021-07-19 MED ORDER — LIDOCAINE 5 % EX PTCH
1.0000 | MEDICATED_PATCH | CUTANEOUS | Status: DC
  Administered 2021-07-19 – 2021-07-20 (×2): 1 via TRANSDERMAL
  Filled 2021-07-19 (×2): qty 1

## 2021-07-19 MED ORDER — SODIUM CHLORIDE 0.9 % IV BOLUS
1000.0000 mL | Freq: Once | INTRAVENOUS | Status: AC
Start: 2021-07-19 — End: 2021-07-19
  Administered 2021-07-19: 1000 mL via INTRAVENOUS

## 2021-07-19 NOTE — Progress Notes (Signed)
Physical Therapy Treatment ?Patient Details ?Name: [name] [name] ?MRN: [id number] ?DOB: [birth year] ?Today's Date: 07/19/2021 ? ? ?History of Present Illness 71 yo female admitted 5/6 with lightheadedness, weakness, dizziness and falls. Pt with UTI and sepsis.  PMHx: HIV, CAD, pulmonary fibrosis with chronic respiratory failure on 2L ? ?  ?PT Comments  ? ? Pt pleasant and able to progress mobility to include limited gait and seated HEP. Pt encouraged to progress activity to include up for toileting and OOB for meals. Will continue to follow to maximize gait and safety.  ? ?92% on 2L ?   ?Recommendations for follow up therapy are one component of a multi-disciplinary discharge planning process, led by the attending physician.  Recommendations may be updated based on patient status, additional functional criteria and insurance authorization. ? ?Follow Up Recommendations ? Skilled nursing-short term rehab (<3 hours/day) ?  ?  ?Assistance Recommended at Discharge Frequent or constant Supervision/Assistance  ?Patient can return home with the following A little help with walking and/or transfers;A little help with bathing/dressing/bathroom;Assistance with cooking/housework;Assist for transportation;Help with stairs or ramp for entrance;Direct supervision/assist for medications management ?  ?Equipment Recommendations ? None recommended by PT  ?  ?Recommendations for Other Services   ? ? ?  ?Precautions / Restrictions Precautions ?Precautions: Fall  ?  ? ?Mobility ? Bed Mobility ?  ?Bed Mobility: Supine to Sit ?  ?  ?Supine to sit: HOB elevated ?  ?  ?General bed mobility comments: HOB 25 degrees with increased time to pivot to left side of bed with reliance on rail ?  ? ?Transfers ?Overall transfer level: Needs assistance ?  ?Transfers: Sit to/from Stand ?Sit to Stand: Min guard ?  ?  ?  ?  ?  ?General transfer comment: cues for hand placement and safety with increased time ?  ? ?Ambulation/Gait ?Ambulation/Gait assistance: Min  guard ?Gait Distance (Feet): 15 Feet ?Assistive device: Rolling walker (2 wheels) ?Gait Pattern/deviations: Step-through pattern, Decreased stride length, Trunk flexed ?  ?Gait velocity interpretation: <1.8 ft/sec, indicate of risk for recurrent falls ?  ?General Gait Details: cues for posture and proximity to RW. Pt walked 15' x 2 trials with seated rest to toilet ? ? ?Stairs ?  ?  ?  ?  ?  ? ? ?Wheelchair Mobility ?  ? ?Modified Rankin (Stroke Patients Only) ?  ? ? ?  ?Balance Overall balance assessment: Needs assistance ?Sitting-balance support: No upper extremity supported, Feet supported ?Sitting balance-Leahy Scale: Good ?  ?  ?Standing balance support: Bilateral upper extremity supported, Reliant on assistive device for balance ?Standing balance-Leahy Scale: Poor ?  ?  ?  ?  ?  ?  ?  ?  ?  ?  ?  ?  ?  ? ?  ?Cognition Arousal/Alertness: Awake/alert ?Behavior During Therapy: [facility] for tasks assessed/performed ?Overall Cognitive Status: Impaired/Different from baseline ?Area of Impairment: Orientation, Memory ?  ?  ?  ?  ?  ?  ?  ?  ?Orientation Level: Disoriented to, Time ?  ?Memory: Decreased short-term memory ?  ?Safety/Judgement: Decreased awareness of deficits ?  ?  ?  ?  ?  ? ?  ?Exercises General Exercises - Lower Extremity ?Long Arc Quad: AROM, Both, Seated, 20 reps ?Hip ABduction/ADduction: AROM, Both, Seated, 20 reps ?Hip Flexion/Marching: AROM, Both, Seated, 10 reps ? ?  ?General Comments   ?  ?  ? ?Pertinent Vitals/Pain Pain Assessment ?Pain Assessment: No/denies pain  ? ? ?Home Living   ?  ?  ?  ?  ?  ?  ?  ?  ?  ?   ?  ?  Prior Function    ?  ?  ?   ? ?PT Goals (current goals can now be found in the care plan section) Progress towards PT goals: Progressing toward goals ? ?  ?Frequency ? ? ? Min 3X/week ? ? ? ?  ?PT Plan Current plan remains appropriate  ? ? ?Co-evaluation   ?  ?  ?  ?  ? ?  ?AM-PAC PT "6 Clicks" Mobility   ?Outcome Measure ? Help needed turning from your back to your side while in a  flat bed without using bedrails?: A Little ?Help needed moving from lying on your back to sitting on the side of a flat bed without using bedrails?: A Little ?Help needed moving to and from a bed to a chair (including a wheelchair)?: A Little ?Help needed standing up from a chair using your arms (e.g., wheelchair or bedside chair)?: A Little ?Help needed to walk in hospital room?: A Little ?Help needed climbing 3-5 steps with a railing? : Total ?6 Click Score: 16 ? ?  ?End of Session Equipment Utilized During Treatment: Gait belt;Oxygen ?Activity Tolerance: Patient tolerated treatment well ?Patient left: in chair;with call bell/phone within reach;with chair alarm set ?Nurse Communication: Mobility status ?PT Visit Diagnosis: Muscle weakness (generalized) (M62.81);Difficulty in walking, not elsewhere classified (R26.2);Other abnormalities of gait and mobility (R26.89) ?  ? ? ?Time: [phone] ?PT Time Calculation (min) (ACUTE ONLY): 24 min ? ?Charges:  $Gait Training: 8-22 mins ?$Therapeutic Activity: 8-22 mins          ?          ? ?Kaelen Brennan P, PT ?Acute Rehabilitation Services ?Pager: [phone] ?Office: [phone] ? ? ? ?Sidney Kann B Cherlynn Popiel ?07/19/2021, 9:28 AM ? ?

## 2021-07-19 NOTE — Progress Notes (Addendum)
?PROGRESS NOTE ? ? ? ?FIA HEBERT  GEZ:[id number] DOB: 1951/01/02 DOA: 07/16/2021 ?PCP: [place]  ? ?Brief Narrative:  ? The patient is an overweight 71 year old Caucasian female with a past medical history significant for but not limited to chronic respiratory failure with hypoxia on continuous 2 L supplemental oxygen via nasal cannula in the setting of pulmonary fibrosis, hyperlipidemia, HIV positive from a needlestick in 1993, history of CAD, history of GERD and other comorbidities who presented to the ED with recurrent falls given that she had experienced 2 ground-level mechanical falls in the last few days without loss of consciousness.  Patient is on dual antiplatelet therapy with aspirin and Plavix given her CAD and states that she has been more weak for last 2 to 3 days.  She also noted subjective chills and fevers.  She denies any dysuria or any gross hematuria and further work-up revealed in the ED that she had a acute urinary tract infection.  Initial lactate level is elevated at 2.8 and then trended down to 1.2.  Urine culture and blood cultures were done and patient was given IV fluid hydration.  She was admitted with a working diagnosis of severe sepsis secondary to UTI with resultant falls.  In the ED she is given IV ceftriaxone and 1 L normal saline bolus.   ?  ?Further work-up reveals that she has an E. coli UTI.  She is slowly improving but states that she is constipated and has not had a bowel movement since last month. She complained of significant abdominal pain yesterday so a CT Scan of the Abdomen and Pelvis was pursued and showed "Large volume stool noted throughout the colon without evidence of obstruction. Fecal load appears slightly  progressive since prior examination. Moderate hiatal hernia.  postsurgical changes again noted within the distal stomach without evidence of obstruction. Aortic Atherosclerosis." ? ?Patient finally had a bowel movement but has a small  bowel movement and feels like she will need something else to help her so she is going to ask for a suppository. ? ? ?Assessment and Plan: ?Severe sepsis secondary to E. coli urinary tract infection ?-She has had new onset subjective fevers, chills generalized weakness, urinalysis in the setting of UTI with a urinalysis showing a cloudy appearance with amber-colored urine, 5 ketones, trace leukocytes, negative nitrites, few bacteria, greater than 50 WBCs ?-WBC on admission was 19.5 and trended down to 13.5 ?-She met SIRS criteria with leukocytosis, tachycardia, tachypnea and elevated lactic acid level of 2.8 on admission ?-Did not receive the 30 mg/kg IV fluid bolus ?-Blood cultures x2 have been initiated and follow-up on them as well as the urine culture ?-Blood cultures x1 (Unclear why she got only one in the ED) showed no growth to date at 2 day ?-Urine culture showing greater than 100,000 colony-forming units of E. coli with sensitivities being pansensitive ?-Procalcitonin level is being checked and was 0.15 and she was continued on maintenance IV fluid hydration with lactated Ringer's at 75 MLS per hour for 8 hours however resumed for 24 hours and discontinued yesterday  ?-Continue monitor and adjust antibiotics as necessary ?  ?Generalized Weakness ?Elevated CK ?-In setting of dehydration and UTI ?-Likely a significant contributor related to her multiple ground level mechanical fall to last few days ?-Continue treatment for UTI ?-CK was elevated at 396 and likely in the setting of her recurrent falls ?-Continue IV fluid hydration as above and now stopped ?-Fall precaution ?-TSH was 3.438 ?-PT  and OT to further evaluate and treat; OT has evaluated and recommending SNF; not a candidate for CIR Per CIR admissions coordinator ?  ?Chronic respiratory failure with hypoxia in the setting of pulmonary fibrosis ?-Baseline O2 is 2 L ?-Currently does not appear to be in exacerbation ?-Chest x-ray does demonstrate chronic  interstitial lung disease without any evidence of any acute cardiopulmonary process including no evidence of acute infiltrates to suggest underlying pneumonia ?-She takes Symbicort as well as an albuterol inhaler which will be continued but will substitute Symbicort for Dulera 2 puffs IH twice daily ?-SpO2: 98 % ?O2 Flow Rate (L/min): 2 L/min ?-Continuous pulse oximetry maintain O2 saturation greater than 90% ?-Continue supplemental oxygen via nasal cannula and wean to home regimen ?-She will need an Ambulatory home O2 screen and a repeat chest x-ray in the a.m. ?  ?Hyperlipidemia ?-Continue with atorvastatin 80 mg p.o. daily ?  ?Metabolic Acidosis ?-Improved and was mild.  CO2 is now 24, chloride level is 112, anion gap is 5 ?-Continue to Monitor and Trend ?-Repeat CMP in the AM  ?  ?Constipation ?Abdominal Pain and Discomfort ?-Initiated bowel regimen with senna docusate 1 tab p.o. twice daily, MiraLAX 17 g p.o. twice daily, and a bisacodyl suppository 10 mg RC daily as needed ?-States that she has not had a bowel movement since the eighth of last month ?-Given the Amount of Abdominal Pain she was having a CT of the Abd/Pelvis was done and showed "Large volume stool noted throughout the colon without evidence of obstruction. Fecal load appears slightly  progressive since prior examination. Moderate hiatal hernia.  postsurgical changes again noted within the distal stomach without evidence of obstruction. Aortic Atherosclerosis" ?-Patient states that she will likely ask for a suppository today but if she is still not improving will consider an enema ?  ?GERD/GI prophylaxis ?-Continue with pantoprazole 40 mg p.o. daily ? ?Dizziness ?-Addendum 18:25: Continues to be persistently dizzy today ?-Given a 1 Liter Bolus without improvement ?-Takes Midodrine ?-Obtain Head CT w/o Contrast given persistence  ?  ?Elevated AST ?-AST was noted to be 79 -> 71 -> 70 -> 60 ?-Continue monitor and trend and if necessary will obtain a  right upper quadrant ultrasound as well as an acute hepatitis panel but it is trending down  ?-Repeat CMP in the a.m. ?  ?Hypoalbuminemia ?-Patient's albumin level went from 3.3 -> 2.7 -> 2.3 -> 2.1 ?-Monitor and trend and repeat CMP in a.m. ?  ?Macrocytic Anemia ?-Patient's hemoglobin/hematocrit went from 14.8/45.0 -> 13.0/39.8 -> 11.6/36.2 -> 10.6/33.1 with an MCV of 100.6 ?-Check Anemia Panel in the AM  ?-Continue to Monitor for S/Sx of Bleeding; No overt bleeding noted ?-Repeat CBC in the AM ?  ?CAD ?-Continue with aspirin 81 mg p.o. daily, atorvastatin 80 mg p.o. nightly, clopidogrel 75 mg p.o. daily, ranolazine 5 mg p.o. twice daily ?  ?HIV ?-She is HIV positive and this stems from a needlestick in 1993.  Per her most recent chart review her CD count was 630 in December 2022 and in May 2022 her viral load was noted to be detectable but low ?-Continue with dolutegravir-lamivudine 50-300 mg per tab 1 tab p.o. daily ? ?Obesity ?-Complicates overall prognosis and care ?-Estimated body mass index is 30.41 kg/m? as calculated from the following: ?  Height as of this encounter: _0  (1.499 m). ?  Weight as of this encounter: 68.3 kg.  ?-Weight Loss and Dietary Counseling given ? ?  ?DVT prophylaxis: SCDs Start:  07/17/21 0402 ? ?  Code Status: Full Code ?Family Communication: No family present at bedside  ? ?Disposition Plan:  ?Level of care: Telemetry Medical ?Status is: Inpatient ?Remains inpatient appropriate because: Needs SNF and Insurance Auth ?  ?Consultants:  ?None ? ?Procedures:  ?CT Abd/Pelvis  ? ?Antimicrobials:  ?Anti-infectives (From admission, onward)  ? ? Start     Dose/Rate Route Frequency Ordered Stop  ? 07/17/21 2200  cefTRIAXone (ROCEPHIN) 1 g in sodium chloride 0.9 % 100 mL IVPB       ? 1 g ?200 mL/hr over 30 Minutes Intravenous Every 24 hours 07/17/21 0403    ? 07/17/21 1000  dolutegravir-lamiVUDine (DOVATO) 50-300 MG per tablet 1 tablet       ? 1 tablet Oral Daily 07/17/21 0426    ? 07/17/21  0330  cefTRIAXone (ROCEPHIN) 1 g in sodium chloride 0.9 % 100 mL IVPB       ? 1 g ?200 mL/hr over 30 Minutes Intravenous  Once 07/17/21 0325 07/17/21 0448  ? ?  ?  ?Subjective: ?Seen and examined at beds

## 2021-07-19 NOTE — TOC Progression Note (Addendum)
Transition of Care (TOC) - Progression Note  ? ? ?Patient Details  ?Name: [name] [name] ?MRN: [id number] ?Date of Birth: Dec 18, 1950 ? ?Transition of Care (TOC) CM/SW Contact  ?Milas Gain, LCSWA ?Phone Number: ?07/19/2021, 2:17 PM ? ?Clinical Narrative:    ? ?CSW spoke with patient at bedside. CSW informed patient that CIR answered CSW back in regards to possible CIR placement. Pamala Hurry with CIR informed CSW that [place] trends show unlikely to approve CIR admit for patients diagnosis nor is CIR recommended by therapy. Patient understands and would like to continue to pursue SNF placement. Patient accepted SNF bed offer with [facility]. CSW spoke with Juliann Pulse with [facility] who confirmed SNF bed offer for patient.CSW added facility choice to patients insurance authorization.Patients insurance authorization currently pending. CSW will continue to follow and assist with patients dc planning needs. ? ?Expected Discharge Plan: [place] ?Barriers to Discharge: Continued Medical Work up ? ?Expected Discharge Plan and Services ?Expected Discharge Plan: [place] ?In-house Referral: Clinical Social Work ?  ?  ?Living arrangements for the past 2 months: [place] ?                ?  ?  ?  ?  ?  ?  ?  ?  ?  ?  ? ? ?Social Determinants of Health (SDOH) Interventions ?  ? ?Readmission Risk Interventions ?   ? View : No data to display.  ?  ?  ?  ? ? ?

## 2021-07-19 NOTE — Progress Notes (Signed)
Inpatient Rehabilitation Admissions Coordinator  ? ? ?Asked by SW to assess for possible Cir admit per patient request. [facility] medicare payor trends show unlikely to approve CIR admit for this diagnosis nor is CIR recommended by therapy. Recommend other rehab venue to be pursued. ? ?Danne Baxter, RN, MSN ?Rehab Admissions Coordinator ?[phone] ?07/19/2021 8:03 AM ? ?

## 2021-07-20 ENCOUNTER — Other Ambulatory Visit: Payer: Self-pay

## 2021-07-20 ENCOUNTER — Inpatient Hospital Stay (HOSPITAL_COMMUNITY): Payer: Medicare Other

## 2021-07-20 LAB — CBC WITH DIFFERENTIAL/PLATELET
Abs Immature Granulocytes: 0.05 10*3/uL (ref 0.00–0.07)
Basophils Absolute: 0 10*3/uL (ref 0.0–0.1)
Basophils Relative: 1 %
Eosinophils Absolute: 0.1 10*3/uL (ref 0.0–0.5)
Eosinophils Relative: 2 %
HCT: 36.6 % (ref 36.0–46.0)
Hemoglobin: 11.9 g/dL — ABNORMAL LOW (ref 12.0–15.0)
Immature Granulocytes: 1 %
Lymphocytes Relative: 19 %
Lymphs Abs: 1.2 10*3/uL (ref 0.7–4.0)
MCH: 32.8 pg (ref 26.0–34.0)
MCHC: 32.5 g/dL (ref 30.0–36.0)
MCV: 100.8 fL — ABNORMAL HIGH (ref 80.0–100.0)
Monocytes Absolute: 0.9 10*3/uL (ref 0.1–1.0)
Monocytes Relative: 15 %
Neutro Abs: 3.8 10*3/uL (ref 1.7–7.7)
Neutrophils Relative %: 62 %
Platelets: 222 10*3/uL (ref 150–400)
RBC: 3.63 MIL/uL — ABNORMAL LOW (ref 3.87–5.11)
RDW: 16.1 % — ABNORMAL HIGH (ref 11.5–15.5)
WBC: 6 10*3/uL (ref 4.0–10.5)
nRBC: 0 % (ref 0.0–0.2)

## 2021-07-20 LAB — IRON AND TIBC
Iron: 78 ug/dL (ref 28–170)
Saturation Ratios: 27 % (ref 10.4–31.8)
TIBC: 286 ug/dL (ref 250–450)
UIBC: 208 ug/dL

## 2021-07-20 LAB — COMPREHENSIVE METABOLIC PANEL
ALT: 27 U/L (ref 0–44)
AST: 92 U/L — ABNORMAL HIGH (ref 15–41)
Albumin: 2.4 g/dL — ABNORMAL LOW (ref 3.5–5.0)
Alkaline Phosphatase: 40 U/L (ref 38–126)
Anion gap: 5 (ref 5–15)
BUN: 7 mg/dL — ABNORMAL LOW (ref 8–23)
CO2: 24 mmol/L (ref 22–32)
Calcium: 8.3 mg/dL — ABNORMAL LOW (ref 8.9–10.3)
Chloride: 111 mmol/L (ref 98–111)
Creatinine, Ser: 0.51 mg/dL (ref 0.44–1.00)
GFR, Estimated: >60 mL/min (ref >60)
Glucose, Bld: 90 mg/dL (ref 70–99)
Potassium: 4 mmol/L (ref 3.5–5.1)
Sodium: 140 mmol/L (ref 135–145)
Total Bilirubin: 0.8 mg/dL (ref 0.3–1.2)
Total Protein: 5 g/dL — ABNORMAL LOW (ref 6.5–8.1)

## 2021-07-20 LAB — VITAMIN B12: Vitamin B-12: 1032 pg/mL — ABNORMAL HIGH (ref 180–914)

## 2021-07-20 LAB — RETICULOCYTES
Immature Retic Fract: 24.7 % — ABNORMAL HIGH (ref 2.3–15.9)
RBC.: 3.63 MIL/uL — ABNORMAL LOW (ref 3.87–5.11)
Retic Count, Absolute: 54.4 10*3/uL (ref 19.0–186.0)
Retic Ct Pct: 1.5 % (ref 0.4–3.1)

## 2021-07-20 LAB — MAGNESIUM: Magnesium: 1.9 mg/dL (ref 1.7–2.4)

## 2021-07-20 LAB — FERRITIN: Ferritin: 36 ng/mL (ref 11–307)

## 2021-07-20 LAB — FOLATE: Folate: 19.4 ng/mL (ref >5.9)

## 2021-07-20 LAB — PHOSPHORUS: Phosphorus: 3.3 mg/dL (ref 2.5–4.6)

## 2021-07-20 MED ORDER — SENNOSIDES-DOCUSATE SODIUM 8.6-50 MG PO TABS: 1.0000 | ORAL_TABLET | Freq: Two times a day (BID) | ORAL | Status: AC

## 2021-07-20 MED ORDER — POLYETHYLENE GLYCOL 3350 17 G PO PACK
17.0000 g | PACK | Freq: Two times a day (BID) | ORAL | Status: AC
Start: 2021-07-20 — End: ?

## 2021-07-20 MED ORDER — CEPHALEXIN 500 MG PO CAPS: 500.0000 mg | ORAL_CAPSULE | Freq: Two times a day (BID) | ORAL | 0 refills | Status: AC

## 2021-07-20 NOTE — Therapy (Signed)
Occupational Therapy Treatment ?Patient Details ?Name: [name] [name] ?MRN: [id number] ?DOB: 1950-05-20 ?Today's Date: 07/20/2021 ? ? ?History of present illness 71 yo female admitted 5/6 with lightheadedness, weakness, dizziness and falls. Pt with UTI and sepsis.  PMHx: HIV, CAD, pulmonary fibrosis with chronic respiratory failure on 2L ?  ?OT comments ? 70 yo female received supine and agreeable to session. Willing to participation in ADL session with focus on bathing and progressing mobility. Pt needing increased assist for LB dressing and bathing as compared to previous session due to fatigue and prolonged sitting EOB and standing with RW during bathing task. She demonstrated good carry over of pursed lip breathing for recovery from exertion. She transitioned to bedside chair at end of session to eat her meal and required no assistance for self feeding or cleaning hands in preparation for consuming meal. Will continue to follow acutely.  ? ?Recommendations for follow up therapy are one component of a multi-disciplinary discharge planning process, led by the attending physician.  Recommendations may be updated based on patient status, additional functional criteria and insurance authorization. ?   ?Follow Up Recommendations ? Skilled nursing-short term rehab (<3 hours/day)  ?  ?Assistance Recommended at Discharge Intermittent Supervision/Assistance  ?Patient can return home with the following ? Assistance with cooking/housework;A little help with bathing/dressing/bathroom;A little help with walking and/or transfers ?  ?Equipment Recommendations ? BSC/3in1;Tub/shower bench  ?  ?Recommendations for Other Services   ? ?  ?Precautions / Restrictions Precautions ?Precautions: Fall ?Restrictions ?Weight Bearing Restrictions: No  ? ? ?  ? ?Mobility Bed Mobility ?Overal bed mobility: Needs Assistance ?Bed Mobility: Supine to Sit ?  ?  ?Supine to sit: HOB elevated ?  ?  ?General bed mobility comments: relying on railing,  transition to L and R sides ?  ? ?Transfers ?Overall transfer level: Needs assistance ?Equipment used: Rolling walker (2 wheels) ?Transfers: Sit to/from Stand ?Sit to Stand: Min guard ?Stand pivot transfers: Min guard ?  ?  ?  ?  ?  ?  ?  ?   ? ?ADL either performed or assessed with clinical judgement  ? ?ADL Overall ADL's : Needs assistance/impaired ?Eating/Feeding: Independent ?  ?Grooming: Dance movement psychotherapist;Wash/dry hands;Sitting;Set up ?  ?Upper Body Bathing: Supervision/ safety;Sitting ?  ?Lower Body Bathing: Sit to/from stand;Minimal assistance;Moderate assistance ?  ?Upper Body Dressing : Minimal assistance;Sitting ?  ?Lower Body Dressing: Maximal assistance;Moderate assistance;Sit to/from stand ?  ?  ?  ?  ?  ?  ?  ?  ?  ?  ? ? ? ?Cognition Arousal/Alertness: Awake/alert ?  ?  ?  ?  ?  ?  ?  ?  ?  ?   ?   ?   ?   ? ? ?Pertinent Vitals/ Pain       Pain Assessment ?Pain Assessment: No/denies pain ?Pain Score:  (intermittent soreness in shoulders) ? ? ?Frequency ? Min 2X/week  ? ? ? ? ?  ?Progress Toward Goals ? ?OT Goals(current goals can now be found in the care plan section) ? Progress towards OT goals: Progressing toward goals ? ?Acute Rehab OT Goals ?OT Goal Formulation: With patient ?Potential to Achieve Goals: Good  ?Plan Discharge plan remains appropriate   ? ?   ?AM-PAC OT "6 Clicks" Daily Activity     ?Outcome Measure ? ? Help from another person eating meals?: None ?Help from another person taking care of personal grooming?: A Little ?Help from another person toileting, which includes using toliet, bedpan,  or urinal?: A Little ?Help from another person bathing (including washing, rinsing, drying)?: A Lot ?Help from another person to put on and taking off regular upper body clothing?: A Little ?Help from another person to put on and taking off regular lower body clothing?: A Lot ?6 Click Score: 17 ? ?  ?End of Session Equipment Utilized During Treatment: Rolling walker (2 wheels) ? ?OT Visit Diagnosis:  Muscle weakness (generalized) (M62.81) ?  ?Activity Tolerance Patient limited by fatigue ?  ?Patient Left in chair;with chair alarm set;with call bell/phone within reach ?  ?Nurse Communication Mobility status ?  ? ?   ? ?Time: 8412-8208 ?OT Time Calculation (min): 24 min ? ?Charges: OT General Charges ?$OT Visit: 1 Visit ?OT Treatments ?$Self Care/Home Management : 23-37 mins ? ? ?Kasandra Knudsen, OTR/L ?07/20/2021, 12:05 PM ? ? ?

## 2021-07-20 NOTE — TOC Transition Note (Signed)
Transition of Care (TOC) - CM/SW Discharge Note ? ? ?Patient Details  ?Name: [name] [name] ?MRN: [id number] ?Date of Birth: [birth year] ? ?Transition of Care (TOC) CM/SW Contact:  ?Milas Gain, LCSWA ?Phone Number: ?07/20/2021, 1:53 PM ? ? ?Clinical Narrative:    ? ?Patient will DC to: [facility] ? ?Anticipated DC date: 07/20/2021 ? ?Family notified: [name] ? ?Transport by: Corey Harold ? ?? ? ?Per MD patient ready for DC to [facility], palliative services will follow patient . RN, patient, patient's family, Anderson Malta with [facility] facility notified of DC. Discharge Summary sent to facility. RN given number for report tele# [phone] RM# [phone]. DC packet on chart. Ambulance transport requested for patient. ? ?CSW signing off.  ? ?Final next level of care: [place] ?Barriers to Discharge: No Barriers Identified ? ? ?Patient Goals and CMS Choice ?Patient states their goals for this hospitalization and ongoing recovery are:: SNF ?CMS Medicare.gov Compare Post Acute Care list provided to:: Patient ?Choice offered to / list presented to : Patient ? ?Discharge Placement ?  ?           ?Patient chooses bed at: [facility] ?Patient to be transferred to facility by: [facility] ?Name of family member notified: [name] ?Patient and family notified of of transfer: 07/20/21 ? ?Discharge Plan and Services ?In-house Referral: Clinical Social Work ?  ?           ?  ?  ?  ?  ?  ?  ?  ?  ?  ?  ? ?Social Determinants of Health (SDOH) Interventions ?  ? ? ?Readmission Risk Interventions ?   ? View : No data to display.  ?  ?  ?  ? ? ? ? ? ?

## 2021-07-20 NOTE — TOC Progression Note (Addendum)
Transition of Care (TOC) - Progression Note  ? ? ?Patient Details  ?Name: [name] [name] ?MRN: [id number] ?Date of Birth: 05/13/1950 ? ?Transition of Care (TOC) CM/SW Contact  ?Milas Gain, LCSWA ?Phone Number: ?07/20/2021, 9:57 AM ? ?Clinical Narrative:    ? ?Update-1:19pm- CSW received insurance authorization approval for patient. [place] ID reference # [id number]. Insurance authorization approval is from 5/10-5/12. Next review date is 5/12.  ? ?Upate 11:20am- CSW spoke with Janie a [facility] who confirmed she can accept patient for SNF placement today if medically ready for dc. CSW called patients insurance to add new SNF facility to patients insurance. Lexicographer number is # [id number]. Insurance authorization currently pending. CSW awaiting determination. ? ?CSW was informed by MD that patient now wanting to change SNF facility from [facility] to [facility]. CSW met with patient at bedside. Patient confirmed with CSW that she would like to go to [facility] . CSW informed patient she will see if [facility] can offer SNF bed for patient.Janie with [facility] is reviewing referral to see if she can offer SNF bed for patient. CSW awaiting determination. ? ?Expected Discharge Plan: [place] ?Barriers to Discharge: Continued Medical Work up ? ?Expected Discharge Plan and Services ?Expected Discharge Plan: [place] ?In-house Referral: Clinical Social Work ?  ?  ?Living arrangements for the past 2 months: [place] ?                ?  ?  ?  ?  ?  ?  ?  ?  ?  ?  ? ? ?Social Determinants of Health (SDOH) Interventions ?  ? ?Readmission Risk Interventions ?   ? View : No data to display.  ?  ?  ?  ? ? ?

## 2021-07-20 NOTE — Discharge Summary (Addendum)
?Physician Discharge Summary ?  ?Patient: [name] [name] MRN: [id number] DOB: 1950/11/21  ?Admit date:     07/16/2021  ?Discharge date: 07/20/21  ?Discharge Physician: Patrecia Pour  ? ?PCP: [place]  ? ?Recommendations at discharge:  ?Continue bowel regimen to treat and prevent constipation. Had 2 BMs on day of discharge.  ?Repeat CMP in 1-2 weeks. ? ?Discharge Diagnoses: ?Principal Problem: ?  Severe sepsis ([place]) ?Active Problems: ?  Chronic respiratory failure with hypoxia (HCC) pulmonary fibrosis asthma ?  Human immunodeficiency virus (HIV) disease ([place]) ?  Acute cystitis ?  Generalized weakness ?  HLD (hyperlipidemia) ? ?Resolved Problems: ?  * No resolved hospital problems. * ? ?Hospital Course: ?Nitasha REBBECCA [name] is a 71 y.o. adult with a history of pulmonary fibrosis with 2L O2 dependence, well-controlled HIV disease, CAD, HLD, and GERD who presented to the ED with increased frequency of falls and weakness. Also confirmed subjective fever/chills and was found to have E. coli UTI for which she was admitted and treated with IV antibiotics with improvement in lactic acidosis. She remains deconditioned and will benefit from SNF level of rehabilitation which is pursued at discharge. Please see further details below. ? ?Assessment and Plan: ?Severe sepsis secondary to E. coli urinary tract infection: E. coli, pansensitive on culture. Blood culture NGTD at 3 days. WBC 19.5k at admission, normalized quickly. Received 4 doses of ceftriaxone, will convert to keflex 557m po BID to complete 7 days.  ?- Met SIRS criteria with leukocytosis, tachycardia, tachypnea and elevated lactic acid level of 2.8 on admission  ?  ?Generalized Weakness: CK level elevated but not severely so. Do not suspect myopathy at this time. TSH wnl at 3.4.  ?- Continue rehabilitation at SNF.  ?- Complete treatment for infection which may have led to more abrupt functional decline. ?  ?Chronic respiratory failure with hypoxia in the  setting of pulmonary fibrosis: This is stable. Continue home 2L O2 and inhaled medications.  ?  ?Metabolic acidosis: Resolved. ?  ?Constipation: Chronic issue, confirmed significant stool burden on CT which was otherwise reassuring.  ?- Continue regular bowel regimen with additional prn doses of po and suppository Tx.  ?- Had 2 hard BMs on 5/10. Abd exam benign.   ?  ?GERD:  ?- Continue PPI ?  ?Dizziness: No neurological deficits on exam. CT head has not shown any acute abnormalities. Sounds more likely to be related to orthostatic changes which have improved with midodrine. Deconditioning also playing a role in this symptom.  ?  ?Elevated AST: Mild, isolated, stable, and asymptomatic.  ?- Recommend recheck at follow up, consider further work up if persistent. Also consider recheck CK if AST remains elevated. Pt is on statin. ?  ?Hypoalbuminemia ?- Recommend augmentation of protein in diet.  ?  ?Macrocytic Anemia: Anemia panel is normal including vitamin B12 level of 14,196and folic acid level 122.2 No bleeding. Hgb stable at 11.9g/dl on last check here.  ?  ?CAD, HLD: ?-Continue with aspirin 81 mg p.o. daily, atorvastatin 80 mg p.o. nightly, clopidogrel 75 mg p.o. daily, ranolazine 5 mg p.o. twice daily ?  ?HIV: Contracted by [facility] in 1993. Per her most recent chart review her CD count was 630 in December 2022 and in May 2022 her viral load was noted to be detectable but low ?- Continue with dolutegravir-lamivudine 50-300 mg per tab 1 tab p.o. daily ? ?Overweight: Body mass index is 29.74 kg/m?.Marland KitchenPostsurgical changes to stomach on CT noted. ? ?  Consultants: None ?Procedures performed: None  ?Disposition: Skilled nursing facility ?Diet recommendation:  ?Cardiac diet ?DISCHARGE MEDICATION: ?Allergies as of 07/20/2021   ? ?   Reactions  ? Isosorbide Nitrate Other (See Comments)  ? Headaches  ? Sulfamethoxazole-trimethoprim Hives, Rash  ? ?  ? ?  ?Medication List  ?  ? ?STOP taking these medications   ? ?lidocaine 2  % solution ?Commonly known as: XYLOCAINE ?  ?omeprazole 40 MG capsule ?Commonly known as: PRILOSEC ?  ? ?  ? ?TAKE these medications   ? ?albuterol 108 (90 Base) MCG/ACT inhaler ?Commonly known as: VENTOLIN HFA ?Inhale 1-2 puffs into the lungs every 6 (six) hours as needed for wheezing or shortness of breath. ?  ?aspirin EC 81 MG tablet ?Take 1 tablet (81 mg total) by mouth daily. ?  ?atorvastatin 80 MG tablet ?Commonly known as: LIPITOR ?Take 80 mg by mouth daily at 6 PM. ?  ?budesonide-formoterol 160-4.5 MCG/ACT inhaler ?Commonly known as: SYMBICORT ?Inhale 2 puffs into the lungs 2 (two) times daily. ?  ?cephALEXin 500 MG capsule ?Commonly known as: KEFLEX ?Take 1 capsule (500 mg total) by mouth 2 (two) times daily for 3 days. ?  ?clopidogrel 75 MG tablet ?Commonly known as: PLAVIX ?Take 75 mg by mouth daily. ?  ?conjugated estrogens 0.625 MG/GM vaginal cream ?Commonly known as: PREMARIN ?Place 6.57 Applicatorfuls vaginally every Wednesday. Pt only to use 1/4 of the applicator ?  ?diclofenac Sodium 1 % Gel ?Commonly known as: VOLTAREN ?Apply 2-4 g topically 2 (two) times daily as needed (joint pain). ?  ?dolutegravir-lamiVUDine 50-300 MG tablet ?Commonly known as: DOVATO ?Take 1 tablet by mouth daily. ?  ?furosemide 20 MG tablet ?Commonly known as: LASIX ?Take 20 mg by mouth daily as needed for fluid. ?  ?midodrine 10 MG tablet ?Commonly known as: PROAMATINE ?Take 1 tablet (10 mg total) by mouth 2 (two) times daily with a meal. ?  ?nitroGLYCERIN 0.4 MG SL tablet ?Commonly known as: NITROSTAT ?Place 1 tablet (0.4 mg total) under the tongue every 5 (five) minutes as needed for chest pain. ?  ?ondansetron 4 MG tablet ?Commonly known as: Zofran ?Take 1 tablet (4 mg total) by mouth every 8 (eight) hours as needed for nausea or vomiting. ?  ?pantoprazole 40 MG tablet ?Commonly known as: PROTONIX ?Take 40 mg by mouth daily. ?  ?polyethylene glycol 17 g packet ?Commonly known as: MIRALAX / GLYCOLAX ?Take 17 g by mouth 2  (two) times daily. ?What changed:  ?when to take this ?reasons to take this ?  ?ranolazine 500 MG 12 hr tablet ?Commonly known as: RANEXA ?Take 500 mg by mouth 2 (two) times daily. ?  ?senna-docusate 8.6-50 MG tablet ?Commonly known as: Senokot-S ?Take 1 tablet by mouth 2 (two) times daily. ?  ?traZODone 50 MG tablet ?Commonly known as: DESYREL ?Take 50 mg by mouth at bedtime. ?  ?trospium 20 MG tablet ?Commonly known as: SANCTURA ?Take 20 mg by mouth 2 (two) times daily. ?  ? ?  ? ? Follow-up Information   ? ? [facility] Follow up.   ?Contact information: ?[address] ?[place] [address] ?[phone] ? ? ?  ?  ? ?  ?  ? ?  ? ?Discharge Exam: ?Filed Weights  ? 07/18/21 0427 07/19/21 0500 07/20/21 0446  ?Weight: 67.1 kg 68.3 kg 66.8 kg  ?BP 136/64 (BP Location: Right Arm)   Pulse 88   Temp 98.5 ?F (36.9 ?C) (Oral)  Resp 20   Ht _0  (1.499 m)   Wt 66.8 kg   SpO2 93%   BMI 29.74 kg/m?   ?No distress ?RRR without JVD or pitting edema ?Nonlabored with scant nondependent crackles ?+BS, soft, NT, ND ? ?Condition at discharge: stable ? ?The results of significant diagnostics from this hospitalization (including imaging, microbiology, ancillary and laboratory) are listed below for reference.  ? ?Imaging Studies: ?CT HEAD WO CONTRAST (5MM) ? ?Result Date: 07/20/2021 ?CLINICAL DATA:  Dizziness, persistent or recurrent with cardiac or vascular cause suspected. EXAM: CT HEAD WITHOUT CONTRAST TECHNIQUE: Contiguous axial images were obtained from the base of the skull through the vertex without intravenous contrast. RADIATION DOSE REDUCTION: This exam was performed according to the departmental dose-optimization program which includes automated exposure control, adjustment of the mA and/or kV according to patient size and/or use of iterative reconstruction technique. COMPARISON:  Three days ago FINDINGS: Brain: No evidence of acute infarction, hemorrhage, hydrocephalus, extra-axial  collection or mass lesion/mass effect. Patchy low-density in the cerebral white matter attributed to chronic small vessel ischemia. Age normal brain volume Vascular: No hyperdense vessel or unexpected calcifi

## 2021-07-20 NOTE — Progress Notes (Signed)
Report called in to receiving nurse- Madsu at [facility]  ?

## 2021-07-21 ENCOUNTER — Other Ambulatory Visit: Payer: Medicare Other | Admitting: Family Medicine

## 2021-07-22 LAB — CULTURE, BLOOD (ROUTINE X 2)
Culture: NO GROWTH
Special Requests: ADEQUATE

## 2021-07-26 ENCOUNTER — Encounter: Payer: Medicare Other | Admitting: Family Medicine

## 2021-07-27 ENCOUNTER — Non-Acute Institutional Stay: Payer: Medicare Other | Admitting: Family Medicine

## 2021-07-27 ENCOUNTER — Encounter: Payer: Self-pay | Admitting: Family Medicine

## 2021-07-27 VITALS — BP 120/60 | HR 82 | Resp 18

## 2021-07-27 DIAGNOSIS — R112 Nausea with vomiting, unspecified: Secondary | ICD-10-CM

## 2021-07-27 DIAGNOSIS — K219 Gastro-esophageal reflux disease without esophagitis: Secondary | ICD-10-CM

## 2021-07-27 DIAGNOSIS — Z515 Encounter for palliative care: Secondary | ICD-10-CM

## 2021-07-27 NOTE — Progress Notes (Signed)
  [facility] Consult Note Telephone: [phone]  Fax: [phone]   Date of encounter: 07/27/21 5:40 PM PATIENT NAME: [name] [name] [address] [place] [address]   [phone] (home)  DOB: 09/20/1950 MRN: [id number] PRIMARY CARE PROVIDER:    [facility],  [address] [place] [address] [phone]  REFERRING PROVIDER:   [facility] [address] [place],  [place] [address] [phone]  RESPONSIBLE PARTY:    Contact Information     Name Relation Home Work Mobile   [name],[name] Daughter [phone]  [phone]        I met face to face with patient in her skilled nursing facility. Palliative Care was asked to follow this patient by consultation request of  [facility] to address advance care planning and complex medical decision making. This is the initial visit.          ASSESSMENT, SYMPTOM MANAGEMENT AND PLAN / RECOMMENDATIONS:   Unspecified nausea and vomiting/GERD Continue Zofran ODT 4 mg Q 8 hrs prn. Pt might benefit from short course of Carafate ACHS given hx of PUD 2.  Palliative Care Encounter Continue to follow and address advance care planning/goals of care   Follow up Palliative Care Visit: Palliative care will continue to follow for complex medical decision making, advance care planning, and clarification of goals. Return 4 weeks or prn.    This visit was coded based on medical decision making (MDM).  PPS: 60%  HOSPICE ELIGIBILITY/DIAGNOSIS: TBD  Chief Complaint:  [facility] received a referral to follow up with patient for chronic disease management of chronic respiratory failure due to idiopathic pulmonary fibrosis.  Follow up is also to assist with advance directive planning and defining/refining goals of care.  HISTORY OF PRESENT ILLNESS:  [name] [name] is a 71 y.o. year old  adult with chronic respiratory failure due to idiopathic pulmonary fibrosis.  She also has HTN, CAD, orthostatic hypotension and near syncope, persistent asthma, allergic rhinitis, hiatal hernia, GERD, dysphagia, rectocele/cystocele and vaginal prolapse, goiter, constipation, HIV positive and recent sepsis with generalized weakness. Having nausea almost after every meal with vomiting, no coffee ground emesis.   She states that she has acid reflux that was better with Omeprazole than Protonix.  She states that prior to most recent hospitalization it had been 1 month since her prior BM but now regular with Miralax.  No blood in stool.  Working with PT, no falls since admission here but was falling frequently in the month before hospitalization with lightheadedness.  Weight is stable, continues to have some lightheadedness but improved.  Pt does not have HC POA but wants daughter [name] to be her representative if she can't speak for herself. Do not share HIV diagnosis with daughter. No coughing or choking after eating or drinking.  Has had esophageal web dilated previously and hx of PUD. She is on 2.5 L [place]. Denies CP, has some orthopnea but this is not changed, no PND. Continent of bowel and bladder.  Sleep is ok.  Mood is doing ok. On 07/20/21 anemia profile unremarkable, CMP remarkable for low BUN 7 (normal Cr 0.51, Calcium 8.3, Albumin 2.4, and elevated AST 92 (normal ALT 27). On 07/17/21 total CK elevated at 396. Vitamin B12 elevated at 1032 on 07/20/21.  CBC remarkable for low RBC 3.63, HGB 11.9 and elevated MCV 100.8. On 07/16/21 Urine culture positive for greater than 100k   E. Coli, pan-sensitive. CT head 07/20/21 no acute findings, chronic small vessel ischemia in hemispheric white matter. Last Echo 06/13/21:  Essentially normal with EF 60-65%, no wall motion abnormality, LVH or valvular disease. History obtained from review of EMR, discussion with  [name] [name].  I reviewed available labs, medications, imaging,  studies and related documents from the EMR.  Records reviewed and summarized above.   ROS General: NAD EYES: denies vision changes ENMT: denies dysphagia Cardiovascular: denies chest pain, endorses DOE Pulmonary: denies cough, denies increased SOB above baseline Abdomen: endorses fair appetite, endorses recent constipation improved on Miralax, endorses continence of bowel GU: denies dysuria, endorses continence of urine MSK:  endorses increased weakness, no recent falls reported Skin: denies rashes or wounds Neurological: denies pain, denies insomnia Psych: Endorses positive mood Heme/lymph/immuno: denies bruises, abnormal bleeding  Physical Exam: Current and past weights: 147 lbs 4.3 ounces as of 07/20/21 Constitutional: NAD General: thin EYES: anicteric sclera, lids intact, no discharge  ENMT: intact hearing, oral mucous membranes moist, dentition intact CV: S1S2, RRR, no LE edema Pulmonary: CTAB except course in bases, no increased work of breathing, no cough, on O2 @ 2.5 L [place] Abdomen: normo-active BS + 4 quadrants, soft and non tender, no ascites GU: deferred MSK: no sarcopenia, moves all extremities, ambulatory Skin: warm and dry, no rashes or wounds on visible skin Neuro:  noted generalized weakness, no cognitive impairment Psych: non-anxious affect, A and O x 3 Hem/lymph/immuno: no widespread bruising  CURRENT PROBLEM LIST:  Patient Active Problem List   Diagnosis Date Noted   Severe sepsis ([place]) 07/17/2021   Acute cystitis 07/17/2021   Generalized weakness 07/17/2021   HLD (hyperlipidemia) 07/17/2021   Vitamin B12 deficiency 06/12/2021   Macrocytic anemia 06/12/2021   Hypotension 06/12/2021   Orthostatic hypotension near syncope 06/11/2021   Elevated AST (SGOT) 06/11/2021   Chronic respiratory failure with hypoxia (HCC) pulmonary fibrosis asthma 06/11/2021   Near syncope 06/11/2021   Intractable nausea and vomiting 07/05/2019   Nausea & vomiting abdominal  pain 09/04/2018   E. coli UTI 09/04/2018   Nausea and/or vomiting 09/04/2018   Influenza due to identified novel influenza A virus with other respiratory manifestations 04/22/2018   Hypokalemia 04/22/2018   HIV (human immunodeficiency virus infection) ([place]) 04/21/2018   Sepsis ([place]) 04/21/2018   Abdominal pain 04/21/2018   Asthma exacerbation 04/21/2018   Anxiety state 03/17/2016   Benign neoplasm of colon 03/17/2016   Human immunodeficiency virus (HIV) disease ([place]) 03/17/2016   Nontoxic uninodular goiter 03/17/2016   Other premature beats 03/17/2016   Rectocele 03/17/2016   Symptomatic bradycardia 03/17/2016   Atypical chest pain 03/17/2016   Lower extremity edema 10/13/2015   Localized acrodermatitis continua of [place] 06/29/2015   Cervical pain (neck) 06/14/2015   Dysphagia 12/14/2014   IPF (idiopathic pulmonary fibrosis) ([place]) 07/06/2014   Paronychia 06/16/2014   Bone pain 08/25/2013   Lumbar radicular pain 08/25/2013   Allergic rhinitis 07/14/2013   Post-operative state 03/25/2013   Preop cardiovascular exam 12/24/2012   Ejection fraction    Cystocele 12/11/2012   Insomnia 10/23/2012   Vaginal prolapse 04/02/2012   Urinary incontinence 02/28/2012   Other benign neoplasm of connective and other soft tissue of lower limb, including hip 09/08/2011   Osteoporosis 03/20/2011   Closed fracture of sacrum ([place]) 12/23/2010   HIV positive (HCC)    CAD (coronary artery disease)    Palpitations    Dyslipidemia    Persistent asthma    Peptic ulcer disease  GERD (gastroesophageal reflux disease)    Bradycardia    Shortness of breath    CONSTIPATION 07/22/2007   WEIGHT LOSS-ABNORMAL 07/22/2007   ABDOMINAL PAIN-GENERALIZED 07/22/2007   PAST MEDICAL HISTORY:  Active Ambulatory Problems    Diagnosis Date Noted   CONSTIPATION 07/22/2007   WEIGHT LOSS-ABNORMAL 07/22/2007   ABDOMINAL PAIN-GENERALIZED 07/22/2007   HIV positive (HCC)    CAD (coronary artery disease)     Palpitations    Dyslipidemia    Persistent asthma    Peptic ulcer disease    GERD (gastroesophageal reflux disease)    Bradycardia    Shortness of breath    Ejection fraction    Preop cardiovascular exam 12/24/2012   Allergic rhinitis 07/14/2013   Anxiety state 03/17/2016   Benign neoplasm of colon 03/17/2016   Bone pain 08/25/2013   Cervical pain (neck) 06/14/2015   Closed fracture of sacrum (HCC) 12/23/2010   Cystocele 12/11/2012   Dysphagia 12/14/2014   Human immunodeficiency virus (HIV) disease (HCC) 03/17/2016   Insomnia 10/23/2012   IPF (idiopathic pulmonary fibrosis) (HCC) 07/06/2014   Localized acrodermatitis continua of [place] 06/29/2015   Lower extremity edema 10/13/2015   Lumbar radicular pain 08/25/2013   Nontoxic uninodular goiter 03/17/2016   Osteoporosis 03/20/2011   Other benign neoplasm of connective and other soft tissue of lower limb, including hip 09/08/2011   Other premature beats 03/17/2016   Paronychia 06/16/2014   Post-operative state 03/25/2013   Rectocele 03/17/2016   Urinary incontinence 02/28/2012   Vaginal prolapse 04/02/2012   Symptomatic bradycardia 03/17/2016   Atypical chest pain 03/17/2016   HIV (human immunodeficiency virus infection) (HCC) 04/21/2018   Sepsis (HCC) 04/21/2018   Abdominal pain 04/21/2018   Asthma exacerbation 04/21/2018   Influenza due to identified novel influenza A virus with other respiratory manifestations 04/22/2018   Hypokalemia 04/22/2018   Nausea & vomiting abdominal pain 09/04/2018   E. coli UTI 09/04/2018   Nausea and/or vomiting 09/04/2018   Intractable nausea and vomiting 07/05/2019   Orthostatic hypotension near syncope 06/11/2021   Elevated AST (SGOT) 06/11/2021   Chronic respiratory failure with hypoxia (HCC) pulmonary fibrosis asthma 06/11/2021   Near syncope 06/11/2021   Vitamin B12 deficiency 06/12/2021   Macrocytic anemia 06/12/2021   Hypotension 06/12/2021   Severe sepsis (HCC) 07/17/2021    Acute cystitis 07/17/2021   Generalized weakness 07/17/2021   HLD (hyperlipidemia) 07/17/2021   Resolved Ambulatory Problems    Diagnosis Date Noted   PULMONARY FIBROSIS 07/06/2008   Other and unspecified hyperlipidemia 03/17/2016   Pulmonary interstitial fibrosis (HCC) 03/17/2016   Pulmonary fibrosis (HCC)    Past Medical History:  Diagnosis Date   Asthma    Colon polyps    H/O: hysterectomy    Hiatal hernia    Hypertension    Weight loss    SOCIAL HX:  Social History   Tobacco Use   Smoking status: Former    Types: Cigarettes    Quit date: 03/13/1996    Years since quitting: 25.3   Smokeless tobacco: Never  Substance Use Topics   Alcohol use: No   FAMILY HX:  Family History  Problem Relation Age of Onset   Coronary artery disease Other        family hx of on mother, father and siblings   Bradycardia Sister    Diabetes Mother    Heart attack Mother    Heart disease Mother    Hypertension Mother    Stroke Mother    Bradycardia Brother          Preferred Pharmacy: ALLERGIES:  Allergies  Allergen Reactions   Isosorbide Nitrate Other (See Comments)    Headaches   Sulfamethoxazole-Trimethoprim Hives and Rash     PERTINENT MEDICATIONS:  Outpatient Encounter Medications as of 07/27/2021  Medication Sig   albuterol (VENTOLIN HFA) 108 (90 Base) MCG/ACT inhaler Inhale 1-2 puffs into the lungs every 6 (six) hours as needed for wheezing or shortness of breath.   aspirin EC 81 MG tablet Take 1 tablet (81 mg total) by mouth daily.   atorvastatin (LIPITOR) 80 MG tablet Take 80 mg by mouth daily at 6 PM.   budesonide-formoterol (SYMBICORT) 160-4.5 MCG/ACT inhaler Inhale 2 puffs into the lungs 2 (two) times daily.    clopidogrel (PLAVIX) 75 MG tablet Take 75 mg by mouth daily.   conjugated estrogens (PREMARIN) vaginal cream Place 8.67 Applicatorfuls vaginally every Wednesday. Pt only to use 1/4 of the applicator    diclofenac Sodium (VOLTAREN) 1 % GEL Apply 2-4 g  topically 2 (two) times daily as needed (joint pain).   dolutegravir-lamiVUDine (DOVATO) 50-300 MG tablet Take 1 tablet by mouth daily.   furosemide (LASIX) 20 MG tablet Take 20 mg by mouth daily as needed for fluid.   midodrine (PROAMATINE) 10 MG tablet Take 1 tablet (10 mg total) by mouth 2 (two) times daily with a meal.   nitroGLYCERIN (NITROSTAT) 0.4 MG SL tablet Place 1 tablet (0.4 mg total) under the tongue every 5 (five) minutes as needed for chest pain.   ondansetron (ZOFRAN) 4 MG tablet Take 1 tablet (4 mg total) by mouth every 8 (eight) hours as needed for nausea or vomiting.   pantoprazole (PROTONIX) 40 MG tablet Take 40 mg by mouth daily.   polyethylene glycol (MIRALAX / GLYCOLAX) 17 g packet Take 17 g by mouth 2 (two) times daily.   ranolazine (RANEXA) 500 MG 12 hr tablet Take 500 mg by mouth 2 (two) times daily.   senna-docusate (SENOKOT-S) 8.6-50 MG tablet Take 1 tablet by mouth 2 (two) times daily.   traZODone (DESYREL) 50 MG tablet Take 50 mg by mouth at bedtime.   trospium (SANCTURA) 20 MG tablet Take 20 mg by mouth 2 (two) times daily.   No facility-administered encounter medications on file as of 07/27/2021.     Advance Care Planning/Goals of Care:  CODE STATUS: Listed as full code with last admission Palliative Care to address goals of care with next follow up    Thank you for the opportunity to participate in the care of Ms. Paxson.  The palliative care team will continue to follow. Please call our office at [phone] if we can be of additional assistance.   Marijo Conception, FNP-C  COVID-19 PATIENT SCREENING TOOL Asked and negative response unless otherwise noted:  Have you had symptoms of covid, tested positive or been in contact with someone with symptoms/positive test in the past 5-10 days?  no

## 2021-07-31 ENCOUNTER — Encounter: Payer: Self-pay | Admitting: Family Medicine

## 2021-07-31 DIAGNOSIS — Z515 Encounter for palliative care: Secondary | ICD-10-CM | POA: Insufficient documentation

## 2021-08-01 ENCOUNTER — Other Ambulatory Visit: Payer: Self-pay

## 2021-08-01 ENCOUNTER — Emergency Department (HOSPITAL_COMMUNITY): Payer: Medicare Other

## 2021-08-01 ENCOUNTER — Inpatient Hospital Stay (HOSPITAL_COMMUNITY)
Admission: EM | Admit: 2021-08-01 | Discharge: 2021-08-03 | DRG: 690 | Disposition: A | Discharge status: Skilled Nursing Facility | Payer: Medicare Other | Attending: Family Medicine | Admitting: Family Medicine

## 2021-08-01 ENCOUNTER — Encounter (HOSPITAL_COMMUNITY): Payer: Self-pay | Admitting: Emergency Medicine

## 2021-08-01 DIAGNOSIS — Z9981 Dependence on supplemental oxygen: Secondary | ICD-10-CM

## 2021-08-01 DIAGNOSIS — Z90710 Acquired absence of both cervix and uterus: Secondary | ICD-10-CM

## 2021-08-01 DIAGNOSIS — N390 Urinary tract infection, site not specified: Principal | ICD-10-CM

## 2021-08-01 DIAGNOSIS — Z7902 Long term (current) use of antithrombotics/antiplatelets: Secondary | ICD-10-CM

## 2021-08-01 DIAGNOSIS — K219 Gastro-esophageal reflux disease without esophagitis: Secondary | ICD-10-CM | POA: Diagnosis present

## 2021-08-01 DIAGNOSIS — Z823 Family history of stroke: Secondary | ICD-10-CM

## 2021-08-01 DIAGNOSIS — Z87891 Personal history of nicotine dependence: Secondary | ICD-10-CM

## 2021-08-01 DIAGNOSIS — J9611 Chronic respiratory failure with hypoxia: Secondary | ICD-10-CM | POA: Diagnosis present

## 2021-08-01 DIAGNOSIS — B9620 Unspecified Escherichia coli [E. coli] as the cause of diseases classified elsewhere: Secondary | ICD-10-CM | POA: Diagnosis present

## 2021-08-01 DIAGNOSIS — J8410 Pulmonary fibrosis, unspecified: Secondary | ICD-10-CM | POA: Diagnosis present

## 2021-08-01 DIAGNOSIS — M542 Cervicalgia: Secondary | ICD-10-CM | POA: Diagnosis not present

## 2021-08-01 DIAGNOSIS — Z8249 Family history of ischemic heart disease and other diseases of the circulatory system: Secondary | ICD-10-CM

## 2021-08-01 DIAGNOSIS — M5416 Radiculopathy, lumbar region: Secondary | ICD-10-CM | POA: Diagnosis present

## 2021-08-01 DIAGNOSIS — Z21 Asymptomatic human immunodeficiency virus [HIV] infection status: Secondary | ICD-10-CM | POA: Diagnosis present

## 2021-08-01 DIAGNOSIS — R262 Difficulty in walking, not elsewhere classified: Secondary | ICD-10-CM | POA: Diagnosis not present

## 2021-08-01 DIAGNOSIS — R531 Weakness: Secondary | ICD-10-CM

## 2021-08-01 DIAGNOSIS — G4486 Cervicogenic headache: Secondary | ICD-10-CM

## 2021-08-01 DIAGNOSIS — B20 Human immunodeficiency virus [HIV] disease: Secondary | ICD-10-CM | POA: Diagnosis present

## 2021-08-01 DIAGNOSIS — Z7982 Long term (current) use of aspirin: Secondary | ICD-10-CM

## 2021-08-01 DIAGNOSIS — M47812 Spondylosis without myelopathy or radiculopathy, cervical region: Secondary | ICD-10-CM | POA: Diagnosis present

## 2021-08-01 DIAGNOSIS — Z955 Presence of coronary angioplasty implant and graft: Secondary | ICD-10-CM

## 2021-08-01 DIAGNOSIS — R5381 Other malaise: Secondary | ICD-10-CM | POA: Diagnosis present

## 2021-08-01 DIAGNOSIS — Z9049 Acquired absence of other specified parts of digestive tract: Secondary | ICD-10-CM

## 2021-08-01 DIAGNOSIS — I2510 Atherosclerotic heart disease of native coronary artery without angina pectoris: Secondary | ICD-10-CM | POA: Diagnosis present

## 2021-08-01 DIAGNOSIS — M50321 Other cervical disc degeneration at C4-C5 level: Secondary | ICD-10-CM | POA: Diagnosis present

## 2021-08-01 DIAGNOSIS — I5032 Chronic diastolic (congestive) heart failure: Secondary | ICD-10-CM

## 2021-08-01 DIAGNOSIS — Z20822 Contact with and (suspected) exposure to covid-19: Secondary | ICD-10-CM | POA: Diagnosis present

## 2021-08-01 DIAGNOSIS — Z87440 Personal history of urinary (tract) infections: Secondary | ICD-10-CM

## 2021-08-01 DIAGNOSIS — Z7951 Long term (current) use of inhaled steroids: Secondary | ICD-10-CM

## 2021-08-01 DIAGNOSIS — B952 Enterococcus as the cause of diseases classified elsewhere: Secondary | ICD-10-CM | POA: Diagnosis present

## 2021-08-01 DIAGNOSIS — I110 Hypertensive heart disease with heart failure: Secondary | ICD-10-CM | POA: Diagnosis present

## 2021-08-01 DIAGNOSIS — Z79899 Other long term (current) drug therapy: Secondary | ICD-10-CM

## 2021-08-01 DIAGNOSIS — E785 Hyperlipidemia, unspecified: Secondary | ICD-10-CM | POA: Diagnosis present

## 2021-08-01 DIAGNOSIS — Z833 Family history of diabetes mellitus: Secondary | ICD-10-CM

## 2021-08-01 HISTORY — PX: HC OCCIPITAL NERVE BLOCK: 36100068

## 2021-08-01 LAB — PROTIME-INR
INR: 0.9 (ref 0.8–1.2)
Prothrombin Time: 12.1 seconds (ref 11.4–15.2)

## 2021-08-01 LAB — COMPREHENSIVE METABOLIC PANEL
ALT: 24 U/L (ref 0–44)
AST: 43 U/L — ABNORMAL HIGH (ref 15–41)
Albumin: 2.5 g/dL — ABNORMAL LOW (ref 3.5–5.0)
Alkaline Phosphatase: 47 U/L (ref 38–126)
Anion gap: 13 (ref 5–15)
BUN: 9 mg/dL (ref 8–23)
CO2: 21 mmol/L — ABNORMAL LOW (ref 22–32)
Calcium: 9.7 mg/dL (ref 8.9–10.3)
Chloride: 98 mmol/L (ref 98–111)
Creatinine, Ser: 0.68 mg/dL (ref 0.44–1.00)
GFR, Estimated: >60 mL/min (ref >60)
Glucose, Bld: 117 mg/dL — ABNORMAL HIGH (ref 70–99)
Potassium: 3.9 mmol/L (ref 3.5–5.1)
Sodium: 132 mmol/L — ABNORMAL LOW (ref 135–145)
Total Bilirubin: 0.9 mg/dL (ref 0.3–1.2)
Total Protein: 7.1 g/dL (ref 6.5–8.1)

## 2021-08-01 LAB — URINALYSIS, ROUTINE W REFLEX MICROSCOPIC
Bilirubin Urine: NEGATIVE
Glucose, UA: NEGATIVE mg/dL
Hgb urine dipstick: NEGATIVE
Ketones, ur: NEGATIVE mg/dL
Nitrite: NEGATIVE
Protein, ur: NEGATIVE mg/dL
Specific Gravity, Urine: 1.014 (ref 1.005–1.030)
WBC, UA: >50 WBC/hpf — ABNORMAL HIGH (ref 0–5)
pH: 5 (ref 5.0–8.0)

## 2021-08-01 LAB — CBC
HCT: 41.8 % (ref 36.0–46.0)
Hemoglobin: 13.9 g/dL (ref 12.0–15.0)
MCH: 33.8 pg (ref 26.0–34.0)
MCHC: 33.3 g/dL (ref 30.0–36.0)
MCV: 101.7 fL — ABNORMAL HIGH (ref 80.0–100.0)
Platelets: 298 10*3/uL (ref 150–400)
RBC: 4.11 MIL/uL (ref 3.87–5.11)
RDW: 16 % — ABNORMAL HIGH (ref 11.5–15.5)
WBC: 10.1 10*3/uL (ref 4.0–10.5)
nRBC: 0 % (ref 0.0–0.2)

## 2021-08-01 LAB — RAPID URINE DRUG SCREEN, HOSP PERFORMED
Amphetamines: NOT DETECTED
Barbiturates: NOT DETECTED
Benzodiazepines: NOT DETECTED
Cocaine: NOT DETECTED
Opiates: NOT DETECTED
Tetrahydrocannabinol: NOT DETECTED

## 2021-08-01 LAB — DIFFERENTIAL
Abs Immature Granulocytes: 0.14 10*3/uL — ABNORMAL HIGH (ref 0.00–0.07)
Basophils Absolute: 0 10*3/uL (ref 0.0–0.1)
Basophils Relative: 0 %
Eosinophils Absolute: 0.1 10*3/uL (ref 0.0–0.5)
Eosinophils Relative: 1 %
Immature Granulocytes: 1 %
Lymphocytes Relative: 14 %
Lymphs Abs: 1.4 10*3/uL (ref 0.7–4.0)
Monocytes Absolute: 1.2 10*3/uL — ABNORMAL HIGH (ref 0.1–1.0)
Monocytes Relative: 12 %
Neutro Abs: 7.3 10*3/uL (ref 1.7–7.7)
Neutrophils Relative %: 72 %

## 2021-08-01 LAB — I-STAT CHEM 8, ED
BUN: 14 mg/dL (ref 8–23)
Calcium, Ion: 1.04 mmol/L — ABNORMAL LOW (ref 1.15–1.40)
Chloride: 105 mmol/L (ref 98–111)
Creatinine, Ser: 0.5 mg/dL (ref 0.44–1.00)
Glucose, Bld: 116 mg/dL — ABNORMAL HIGH (ref 70–99)
HCT: 42 % (ref 36.0–46.0)
Hemoglobin: 14.3 g/dL (ref 12.0–15.0)
Potassium: 4.2 mmol/L (ref 3.5–5.1)
Sodium: 139 mmol/L (ref 135–145)
TCO2: 26 mmol/L (ref 22–32)

## 2021-08-01 LAB — RESP PANEL BY RT-PCR (FLU A&B, COVID) ARPGX2
Influenza A by PCR: NEGATIVE
Influenza B by PCR: NEGATIVE
SARS Coronavirus 2 by RT PCR: NEGATIVE

## 2021-08-01 LAB — LACTIC ACID, PLASMA: Lactic Acid, Venous: 1.5 mmol/L (ref 0.5–1.9)

## 2021-08-01 LAB — APTT: aPTT: 25 s (ref 24–36)

## 2021-08-01 LAB — ETHANOL: Alcohol, Ethyl (B): <10 mg/dL (ref <10)

## 2021-08-01 MED ORDER — SODIUM CHLORIDE 0.9 % IV SOLN
1.0000 g | INTRAVENOUS | Status: AC
  Administered 2021-08-02: 1 g via INTRAVENOUS
  Filled 2021-08-01: qty 10

## 2021-08-01 MED ORDER — POLYETHYLENE GLYCOL 3350 17 G PO PACK
17.0000 g | PACK | Freq: Two times a day (BID) | ORAL | Status: DC
  Administered 2021-08-02: 17 g via ORAL
  Filled 2021-08-01 (×3): qty 1

## 2021-08-01 MED ORDER — ASPIRIN 81 MG PO TBEC
81.0000 mg | DELAYED_RELEASE_TABLET | Freq: Every day | ORAL | Status: DC
  Administered 2021-08-02 – 2021-08-03 (×2): 81 mg via ORAL
  Filled 2021-08-01 (×2): qty 1

## 2021-08-01 MED ORDER — MIDODRINE HCL 5 MG PO TABS
10.0000 mg | ORAL_TABLET | Freq: Two times a day (BID) | ORAL | Status: DC
  Administered 2021-08-02 – 2021-08-03 (×4): 10 mg via ORAL
  Filled 2021-08-01 (×4): qty 2

## 2021-08-01 MED ORDER — PANTOPRAZOLE SODIUM 40 MG PO TBEC
40.0000 mg | DELAYED_RELEASE_TABLET | Freq: Every day | ORAL | Status: DC
  Administered 2021-08-01 – 2021-08-02 (×2): 40 mg via ORAL
  Filled 2021-08-01 (×2): qty 1

## 2021-08-01 MED ORDER — FUROSEMIDE 20 MG PO TABS: 20.0000 mg | ORAL_TABLET | Freq: Every day | ORAL | Status: DC | PRN

## 2021-08-01 MED ORDER — ALBUTEROL SULFATE HFA 108 (90 BASE) MCG/ACT IN AERS: 1.0000 | INHALATION_SPRAY | Freq: Four times a day (QID) | RESPIRATORY_TRACT | Status: DC | PRN

## 2021-08-01 MED ORDER — TRAZODONE HCL 50 MG PO TABS
50.0000 mg | ORAL_TABLET | Freq: Every day | ORAL | Status: DC
Start: 2021-08-01 — End: 2021-08-03
  Administered 2021-08-01 – 2021-08-02 (×2): 50 mg via ORAL
  Filled 2021-08-01 (×2): qty 1

## 2021-08-01 MED ORDER — ESTROGENS CONJUGATED 0.625 MG/GM VA CREA
0.2500 | TOPICAL_CREAM | VAGINAL | Status: DC
  Filled 2021-08-01: qty 30

## 2021-08-01 MED ORDER — ESTRADIOL 0.1 MG/GM VA CREA: 1.0000 | TOPICAL_CREAM | Freq: Every day | VAGINAL | Status: DC

## 2021-08-01 MED ORDER — LACTATED RINGERS IV BOLUS
1000.0000 mL | Freq: Once | INTRAVENOUS | Status: AC
Start: 2021-08-01 — End: 2021-08-01
  Administered 2021-08-01: 1000 mL via INTRAVENOUS

## 2021-08-01 MED ORDER — DICLOFENAC SODIUM 1 % EX GEL: 2.0000 g | Freq: Two times a day (BID) | CUTANEOUS | Status: DC | PRN

## 2021-08-01 MED ORDER — DOLUTEGRAVIR-LAMIVUDINE 50-300 MG PO TABS
1.0000 | ORAL_TABLET | Freq: Every day | ORAL | Status: DC
  Administered 2021-08-02 – 2021-08-03 (×2): 1 via ORAL
  Filled 2021-08-01 (×2): qty 1

## 2021-08-01 MED ORDER — LIDOCAINE 5 % EX PTCH
1.0000 | MEDICATED_PATCH | CUTANEOUS | Status: DC
  Administered 2021-08-01 – 2021-08-02 (×2): 1 via TRANSDERMAL
  Filled 2021-08-01 (×2): qty 1

## 2021-08-01 MED ORDER — ALBUTEROL SULFATE (2.5 MG/3ML) 0.083% IN NEBU: 2.5000 mg | INHALATION_SOLUTION | Freq: Four times a day (QID) | RESPIRATORY_TRACT | Status: DC | PRN

## 2021-08-01 MED ORDER — DARIFENACIN HYDROBROMIDE ER 7.5 MG PO TB24
7.5000 mg | ORAL_TABLET | Freq: Every day | ORAL | Status: DC
Start: 2021-08-02 — End: 2021-08-02
  Administered 2021-08-02: 7.5 mg via ORAL
  Filled 2021-08-01: qty 1

## 2021-08-01 MED ORDER — ATORVASTATIN CALCIUM 80 MG PO TABS
80.0000 mg | ORAL_TABLET | Freq: Every day | ORAL | Status: DC
Start: 2021-08-02 — End: 2021-08-03
  Administered 2021-08-02: 80 mg via ORAL
  Filled 2021-08-01: qty 1

## 2021-08-01 MED ORDER — CLOPIDOGREL BISULFATE 75 MG PO TABS
75.0000 mg | ORAL_TABLET | Freq: Every day | ORAL | Status: DC
  Administered 2021-08-01 – 2021-08-03 (×3): 75 mg via ORAL
  Filled 2021-08-01 (×3): qty 1

## 2021-08-01 MED ORDER — BUPIVACAINE HCL (PF) 0.5 % IJ SOLN
10.0000 mL | Freq: Once | INTRAMUSCULAR | Status: AC
  Administered 2021-08-01: 10 mL
  Filled 2021-08-01: qty 10

## 2021-08-01 MED ORDER — SODIUM CHLORIDE 0.9 % IV SOLN
1.0000 g | Freq: Once | INTRAVENOUS | Status: AC
  Administered 2021-08-01: 1 g via INTRAVENOUS
  Filled 2021-08-01: qty 10

## 2021-08-01 MED ORDER — ENOXAPARIN SODIUM 40 MG/0.4ML IJ SOSY
40.0000 mg | PREFILLED_SYRINGE | INTRAMUSCULAR | Status: DC
  Administered 2021-08-01 – 2021-08-02 (×2): 40 mg via SUBCUTANEOUS
  Filled 2021-08-01 (×2): qty 0.4

## 2021-08-01 MED ORDER — FLUTICASONE FUROATE-VILANTEROL 200-25 MCG/ACT IN AEPB
1.0000 | INHALATION_SPRAY | Freq: Every day | RESPIRATORY_TRACT | Status: DC
Start: 2021-08-01 — End: 2021-08-03
  Administered 2021-08-02 – 2021-08-03 (×2): 1 via RESPIRATORY_TRACT
  Filled 2021-08-01: qty 28

## 2021-08-01 MED ORDER — RANOLAZINE ER 500 MG PO TB12
500.0000 mg | ORAL_TABLET | Freq: Two times a day (BID) | ORAL | Status: DC
  Administered 2021-08-01 – 2021-08-03 (×4): 500 mg via ORAL
  Filled 2021-08-01 (×5): qty 1

## 2021-08-01 MED ORDER — SENNOSIDES-DOCUSATE SODIUM 8.6-50 MG PO TABS
1.0000 | ORAL_TABLET | Freq: Two times a day (BID) | ORAL | Status: DC
  Administered 2021-08-02 – 2021-08-03 (×2): 1 via ORAL
  Filled 2021-08-01 (×3): qty 1

## 2021-08-01 MED ORDER — ONDANSETRON HCL 4 MG PO TABS
4.0000 mg | ORAL_TABLET | Freq: Three times a day (TID) | ORAL | Status: DC | PRN
  Administered 2021-08-02 – 2021-08-03 (×4): 4 mg via ORAL
  Filled 2021-08-01 (×4): qty 1

## 2021-08-01 MED ORDER — ENSURE ENLIVE PO LIQD
237.0000 mL | Freq: Two times a day (BID) | ORAL | Status: DC
  Administered 2021-08-03: 237 mL via ORAL
  Filled 2021-08-01: qty 237

## 2021-08-01 NOTE — Progress Notes (Signed)
[facility]) Hospital Liaison note:  This patient is currently enrolled in [facility] outpatient-based Palliative Care. Will continue to follow for disposition.  Please call with any outpatient palliative questions or concerns.  Thank you, Lorelee Market, LPN [facility] Liaison [phone]

## 2021-08-01 NOTE — ED Provider Notes (Signed)
After prior EDP.  Patient's work-up is demonstrative of likely recurrent UTI.  Antibiotics initiated in ED.  Case discussed with admitting hospitalist service who will evaluate for admission.   Valarie Merino, MD 08/01/21 [phone]

## 2021-08-01 NOTE — H&P (Signed)
History and Physical    YACHET MATTSON IFO:[id number] DOB: 1950-09-20 DOA: 08/01/2021  PCP: [place] (Confirm with patient/family/NH records and if not entered, this has to be entered at [facility] point of entry) Patient coming from: Home  I have personally briefly reviewed patient's old medical records in [place]  Chief Complaint: Feeling weak, burning sensation when urinating  HPI: Lusine SHANIYAH WIX is a 71 y.o. adult with medical history significant of recurrent UTIs, mild intermittent asthma, CAD, HIV on HAART, HTN, pulmonary fibrosis, chronic hypoxic respite failure on 2 L continuously, presented with recurrent UTI-like symptoms and generalized weakness.  Patient was recently hospitalized for urosepsis with pansensitive E. coli completed antibiotic treatment and patient discharged to [facility] rehab center, completed 7 days of rehab and discharged home this morning.  But patient started to feel burning-like sensation but urinate this morning and started to feel generalized weakness, bilateral lower extremity, denies any fever chills, no back pain.  Family took her home and found she has difficulty to climb stairs.  Patient also experienced sharp-like left-sided neck pain which is new, sharp-like radiating to the back of the head.  ED Course: Vital signs stable, no hypotension no tachycardia, afebrile.  Code stroke was called in the ED for slight one-sided weakness on the left side.  CT head negative for acute findings.  Neurology evaluated patient and code stroke was called off, CT cervical spine showed mild to moderate multilevel cervical spine degenerative changes worse at C4/C5 and C5-6 6, and patient was diagnosed with headache related to cervical spine arthritis, neurology did occipital nerve block, and patient reported headache significantly improved.   Review of Systems: As per HPI otherwise 14 point review of systems negative.    Past Medical History:   Diagnosis Date   Asthma    Asthma exacerbation 04/21/2018   Bradycardia    Sinus bradycardia   CAD (coronary artery disease)    Minimal by history, Question coronary artery spasm   Closed fracture of sacrum (HCC) 12/23/2010   Colon polyps    Dyslipidemia    GERD (gastroesophageal reflux disease)    Esophageal stricture   H/O: hysterectomy    Hiatal hernia    HIV positive ([place])    Needle stick 1993   Hypertension    Influenza due to identified novel influenza A virus with other respiratory manifestations 04/22/2018   Palpitations    Peptic ulcer disease    Pulmonary fibrosis ([place])    on 2L home O2, prn at night   Shortness of breath    Extensive evaluation Dr. Winona Legato, [facility], May, 6767, etiology uncertain, probable bronchiolitis possibility of lung biopsy at that time   Weight loss     Past Surgical History:  Procedure Laterality Date   ABDOMINAL HYSTERECTOMY     APPENDECTOMY     CARDIAC CATHETERIZATION N/A 03/21/2016   Procedure: Right/Left Heart Cath and Coronary Angiography;  Surgeon: Belva Crome, MD;  Location: [facility];  Service: Cardiovascular;  Laterality: N/A;   CHOLECYSTECTOMY     CORONARY STENT PLACEMENT  12/2017   GASTRECTOMY     VENTRAL HERNIA REPAIR       reports that she quit smoking about 25 years ago. Her smoking use included cigarettes. She has never used smokeless tobacco. She reports that she does not drink alcohol and does not use drugs.  Allergies  Allergen Reactions   Isosorbide Nitrate Other (See Comments)    Headaches  Sulfamethoxazole-Trimethoprim Hives and Rash    Family History  Problem Relation Age of Onset   Coronary artery disease Other        family hx of on mother, father and siblings   Bradycardia Sister    Diabetes Mother    Heart attack Mother    Heart disease Mother    Hypertension Mother    Stroke Mother    Bradycardia Brother      Prior to Admission medications   Medication Sig Start Date End Date  Taking? Authorizing Provider  atorvastatin (LIPITOR) 80 MG tablet Take 80 mg by mouth daily at 6 PM.   Yes [provider]  furosemide (LASIX) 20 MG tablet Take 20 mg by mouth daily as needed for fluid. 06/01/21  Yes [provider]  lidocaine (LIDODERM) 5 % Place 1 patch onto the skin daily. 07/28/21  Yes [provider]  midodrine (PROAMATINE) 10 MG tablet Take 1 tablet (10 mg total) by mouth 2 (two) times daily with a meal. 06/17/21  Yes Domenic Polite, MD  trospium (SANCTURA) 20 MG tablet Take 20 mg by mouth 2 (two) times daily. 05/30/21  Yes [provider]  albuterol (VENTOLIN HFA) 108 (90 Base) MCG/ACT inhaler Inhale 1-2 puffs into the lungs every 6 (six) hours as needed for wheezing or shortness of breath.    [provider]  aspirin EC 81 MG tablet Take 1 tablet (81 mg total) by mouth daily. 03/17/16   Dorothy Spark, MD  budesonide-formoterol [facility]) 160-4.5 MCG/ACT inhaler Inhale 2 puffs into the lungs 2 (two) times daily.     [provider]  clopidogrel (PLAVIX) 75 MG tablet Take 75 mg by mouth daily.    [provider]  conjugated estrogens (PREMARIN) vaginal cream Place 7.10 Applicatorfuls vaginally every Wednesday. Pt only to use 1/4 of the applicator     [provider]  diclofenac Sodium (VOLTAREN) 1 % GEL Apply 2-4 g topically 2 (two) times daily as needed (joint pain). 05/18/21   [provider]  dolutegravir-lamiVUDine (DOVATO) 50-300 MG tablet Take 1 tablet by mouth daily.    [provider]  estradiol (ESTRACE) 0.1 MG/GM vaginal cream Place vaginally. 07/25/21   [provider]  nitroGLYCERIN (NITROSTAT) 0.4 MG SL tablet Place 1 tablet (0.4 mg total) under the tongue every 5 (five) minutes as needed for chest pain. 03/21/16   Belva Crome, MD  ondansetron (ZOFRAN) 4 MG tablet Take 1 tablet (4 mg total) by mouth every 8 (eight) hours as needed for nausea or vomiting. 06/17/21   Domenic Polite, MD  pantoprazole (PROTONIX) 40 MG tablet Take 40 mg by mouth daily. 05/24/21   [provider]  polyethylene glycol (MIRALAX / GLYCOLAX) 17 g packet Take 17 g by mouth 2 (two) times daily. 07/20/21   Patrecia Pour, MD  ranolazine (RANEXA) 500 MG 12 hr tablet Take 500 mg by mouth 2 (two) times daily. 05/16/21   [provider]  senna-docusate (SENOKOT-S) 8.6-50 MG tablet Take 1 tablet by mouth 2 (two) times daily. 07/20/21   Patrecia Pour, MD  traZODone (DESYREL) 50 MG tablet Take 50 mg by mouth at bedtime. 05/04/21   [provider]    Physical Exam: Vitals:   08/01/21 1500 08/01/21 1530 08/01/21 1600 08/01/21 1630  BP: 119/64 (!) 107/50 131/74 128/68  Pulse: 81 86 85 81  Resp: '18 16 16 17  '$ Temp:      SpO2: 100% 100% 100% 100%  Weight:      Height:        Constitutional: NAD, calm, comfortable Vitals:   08/01/21 1500 08/01/21 1530 08/01/21 1600 08/01/21 1630  BP: 119/64 (!) 107/50 131/74 128/68  Pulse: 81 86 85 81  Resp: '18 16 16 17  '$ Temp:      SpO2: 100% 100% 100% 100%  Weight:      Height:       Eyes: PERRL, lids and conjunctivae normal ENMT: Mucous membranes are moist. Posterior pharynx clear of any exudate or lesions.Normal dentition.  Neck: normal, supple, no masses, no thyromegaly Respiratory: clear to auscultation bilaterally, no wheezing, no crackles. Normal respiratory effort. No accessory muscle use.  Cardiovascular: Regular rate and rhythm, no murmurs / rubs / gallops. No extremity edema. 2+ pedal pulses. No carotid bruits.  Abdomen: no tenderness, no masses palpated. No hepatosplenomegaly. Bowel sounds positive.  Musculoskeletal: no clubbing / cyanosis. No joint deformity upper and lower extremities. Good ROM, no contractures. Normal muscle tone.  Skin: no rashes, lesions, ulcers. No induration Neurologic: CN 2-12 grossly intact. Sensation intact, DTR normal. Strength 5/5 in all 4.  Psychiatric: Normal judgment and insight. Alert and  oriented x 3. Normal mood.     Labs on Admission: I have personally reviewed following labs and imaging studies  CBC: Recent Labs  Lab 08/01/21 1242 08/01/21 1329  WBC 10.1  --   NEUTROABS 7.3  --   HGB 13.9 14.3  HCT 41.8 42.0  MCV 101.7*  --   PLT 298  --    Basic Metabolic Panel: Recent Labs  Lab 08/01/21 1242 08/01/21 1329  NA 132* 139  K 3.9 4.2  CL 98 105  CO2 21*  --   GLUCOSE 117* 116*  BUN 9 14  CREATININE 0.68 0.50  CALCIUM 9.7  --    GFR: Estimated Creatinine Clearance (by C-G formula based on SCr of 0.5 mg/dL) Female: 53.8 mL/min Female: 66.6 mL/min Liver Function Tests: Recent Labs  Lab 08/01/21 1242  AST 43*  ALT 24  ALKPHOS 47  BILITOT 0.9  PROT 7.1  ALBUMIN 2.5*   No results for input(s): LIPASE, AMYLASE in the last 168 hours. No results for input(s): AMMONIA in the last 168 hours. Coagulation Profile: Recent Labs  Lab 08/01/21 1242  INR 0.9   Cardiac Enzymes: No results for input(s): CKTOTAL, CKMB, CKMBINDEX, TROPONINI in the last 168 hours. BNP (last 3 results) No results for input(s): PROBNP in the last 8760 hours. HbA1C: No results for input(s): HGBA1C in the last 72 hours. CBG: No results for input(s): GLUCAP in the last 168 hours. Lipid Profile: No results for input(s): CHOL, HDL, LDLCALC, TRIG, CHOLHDL, LDLDIRECT in the last 72 hours. Thyroid Function Tests: No results for input(s): TSH, T4TOTAL, FREET4, T3FREE, THYROIDAB in the last 72 hours. Anemia Panel: No results for input(s): VITAMINB12, FOLATE, FERRITIN, TIBC, IRON, RETICCTPCT in the last 72 hours. Urine analysis:    Component Value Date/Time   COLORURINE AMBER (A) 08/01/2021 1640   APPEARANCEUR HAZY (A) 08/01/2021 1640   LABSPEC 1.014 08/01/2021 1640   PHURINE 5.0 08/01/2021 1640   GLUCOSEU NEGATIVE 08/01/2021 1640   HGBUR NEGATIVE 08/01/2021 1640   BILIRUBINUR NEGATIVE 08/01/2021 1640   KETONESUR NEGATIVE 08/01/2021 1640   PROTEINUR NEGATIVE 08/01/2021 1640    UROBILINOGEN 4.0 (H) 07/29/2009 0740   NITRITE NEGATIVE 08/01/2021 1640   LEUKOCYTESUR MODERATE (A) 08/01/2021 1640    Radiological Exams on Admission: CT CERVICAL SPINE WO CONTRAST  Result Date:  08/01/2021 CLINICAL DATA:  Chronic neck pain. EXAM: CT CERVICAL SPINE WITHOUT CONTRAST TECHNIQUE: Multidetector CT imaging of the cervical spine was performed without intravenous contrast. Multiplanar CT image reconstructions were also generated. RADIATION DOSE REDUCTION: This exam was performed according to the departmental dose-optimization program which includes automated exposure control, adjustment of the mA and/or kV according to patient size and/or use of iterative reconstruction technique. COMPARISON:  None Available. FINDINGS: Alignment: C1 to the superior endplate of T2 is imaged. Normal alignment of the cervical spine. No anterolisthesis or retrolisthesis. The bilateral facets are normally aligned. Skull base and vertebrae: The dens is normally positioned between the lateral masses of C1. Normal atlantodental atlantoaxial articulations. Soft tissues and spinal canal: Prevertebral soft tissues are normal. Disc levels: Mild-to-moderate multilevel cervical spine DDD, worse at C4-C5 and C5-C6 with disc space height loss endplate irregularity and small posteriorly directed disc osteophyte complexes at locations. Upper chest: Limited visualization of the lung apices demonstrates grossly symmetric biapical pleuroparenchymal thickening. Other: Normal noncontrast appearance of the thyroid gland. IMPRESSION: Mild-to-moderate multilevel cervical spine DDD, worse at C4-C5 and C5-C6. Electronically Signed   By: Sandi Mariscal M.D.   On: 08/01/2021 13:04   CT HEAD CODE STROKE WO CONTRAST  Result Date: 08/01/2021 CLINICAL DATA:  Code stroke. Neuro deficit, acute, stroke suspected. Left-sided numbness. EXAM: CT HEAD WITHOUT CONTRAST TECHNIQUE: Contiguous axial images were obtained from the base of the skull through  the vertex without intravenous contrast. RADIATION DOSE REDUCTION: This exam was performed according to the departmental dose-optimization program which includes automated exposure control, adjustment of the mA and/or kV according to patient size and/or use of iterative reconstruction technique. COMPARISON:  Head CT 07/20/2021 FINDINGS: Brain: There is no evidence of an acute infarct, intracranial hemorrhage, mass, midline shift, or extra-axial fluid collection. The ventricles and sulci are within normal limits for age. Hypodensities in the cerebral white matter are unchanged and nonspecific but compatible with mild chronic small vessel ischemic disease. Vascular: No hyperdense vessel. Skull: No fracture or suspicious osseous lesion. Sinuses/Orbits: Mild mucosal thickening in the right sphenoid sinus. Clear mastoid air cells. Unremarkable orbits. Other: None. ASPECTS [facility] Stroke Program Early CT Score) - Ganglionic level infarction (caudate, lentiform nuclei, internal capsule, insula, M1-M3 cortex): 7 - Supraganglionic infarction (M4-M6 cortex): 3 Total score (0-10 with 10 being normal): 10 IMPRESSION: 1. No evidence of acute intracranial abnormality. ASPECTS of 10. 2. Mild chronic small vessel ischemic disease. These results were communicated to Dr. Theda Sers at 12:54 pm on 08/01/2021 by text page via the [facility] messaging system. Electronically Signed   By: Logan Bores M.D.   On: 08/01/2021 12:56    EKG: Independently reviewed.  Sinus, no acute ST changes  Assessment/Plan Active Problems:   Recurrent UTI   Generalized weakness  (please populate well all problems here in Problem List. (For example, if patient is on BP meds at home and you resume or decide to hold them, it is a problem that needs to be her. Same for CAD, COPD, HLD and so on)  Recurrent UTI -Third episode in 62-month recommend suppression antibiotic treatment -No history of resistant organism UTI, continue ceftriaxone while waiting for  urine culture.  Generalized weakness, acute on chronic ambulation dysfunction -Secondary to UTI.  [facility]  PT evaluation and likely discharge to rehab tomorrow. -TSH was checked last week.  Acute headache -Likely related to degenerative cervical spine arthritis, significant improved after local analgesia injection.   Chronic hypoxic respite failure -Stable, secondary to  pulmonary fibrosis.  Continue 2 L as baseline.  Chronic hypoalbuminemia -Diagnosed on last admission, impression was secondary to poor protein intake.  Start Ensure  HIV -Continue Dovato  History of CAD -No Acute concern, continue Plavix and statin   DVT prophylaxis: Lovenox Code Status: Full code Family Communication: None at bedside Disposition Plan: Expect less than 2 midnight hospital stay Consults called: Neurology Admission status: MedSurg observation   Lequita Halt MD [facility] Pager [phone]  08/01/2021, 7:01 PM

## 2021-08-01 NOTE — ED Notes (Signed)
Upon reassessment of patient, now complaining of left sided weakness, patient reports this starting at 10:00, was not reported during initial triage.

## 2021-08-01 NOTE — Procedures (Addendum)
Procedure Date:  08/01/2021 Procedure: Occipital Nerve Block, bilateral, cervicogenic trigger point injection bilaterally, >3 Pre-procedure Diagnosis: Headache  Post-procedure Diagnosis: same as above  Prior to Procedure:  Informed Consent: The risks, benefits, indications, potential complications, and alternatives were explained to the patient/family and informed consent obtained.  Attending Staff:  Lynnae Sandhoff MD Nurse Practitioner: Janine Ores FNP-BC Skin Prep: Cleansed with alcohol.  Anesthesia: Bupivacaine 0.5% 57m   Indications: [name] [name] a 71yo female here for headache. The identity of the patient was confirmed and a bedside time out was performed.  Description of Procedure: Pt's skin was prepped with alcohol and Bupivacaine 0.5% was injected over the occipital ridge in a fan-like fashion with appropriate procedure bilaterally. Pt had immediate relief.  Findings: immediate HA relief  Complications: The patient tolerated the procedure well with no complications.  Specimens:none  Estimated blood loss: zero    Electronically signed by:  HLynnae Sandhoff MD Page: [id number]/22/2023, 7:24 PM

## 2021-08-01 NOTE — Consult Note (Signed)
Neurology Consultation  Reason for Consult: Code stroke Referring Physician: Maryan Rued  CC: Left side weakness  History is obtained from:Patient  HPI: [name] [name] is a 71 y.o. adult with a past medical history of Asthma, bradycardia, CAD, HLD,  pulmonary fibrosis on 2L [place], HIV, and HTN who was discharged from her rehab facility ([facility]) this morning after an admission for sepsis (07/16/2021-07/20/2021). She endorses 2 days of nausea or vomiting. She was discharged from rehab today. Her daughter brought her home and was trying to get her into the house and they couldn't make it up the stairs. She reports that it appeared her head spasmed and then she could not raise her head . EMS was called and they brought her to the hospital.    Patient is endorsing neck pain that is radiating up her head. She states that she can't remember a time when her head and neck didn't hurt. This pain is limiting her strength in her left arm. She endorses bilaterally lower extremity weakness as well.    LKW: 1000 TNK given?: no, stroke not suspected IR Thrombectomy? No, no LVO Modified Rankin Scale: 4-Needs assistance to walk and tend to bodily needs  ROS: A complete ROS was performed and is negative except as noted in the HPI.   Past Medical History:  Diagnosis Date   Asthma    Asthma exacerbation 04/21/2018   Bradycardia    Sinus bradycardia   CAD (coronary artery disease)    Minimal by history, Question coronary artery spasm   Closed fracture of sacrum (HCC) 12/23/2010   Colon polyps    Dyslipidemia    GERD (gastroesophageal reflux disease)    Esophageal stricture   H/O: hysterectomy    Hiatal hernia    HIV positive ([place])    Needle stick 1993   Hypertension    Influenza due to identified novel influenza A virus with other respiratory manifestations 04/22/2018   Palpitations    Peptic ulcer disease    Pulmonary fibrosis ([place])    on 2L home O2, prn at night   Shortness of breath    Extensive  evaluation Dr. Winona Legato, [facility], May, 1610, etiology uncertain, probable bronchiolitis possibility of lung biopsy at that time   Weight loss     Family History  Problem Relation Age of Onset   Coronary artery disease Other        family hx of on mother, father and siblings   Bradycardia Sister    Diabetes Mother    Heart attack Mother    Heart disease Mother    Hypertension Mother    Stroke Mother    Bradycardia Brother      Social History:   reports that she quit smoking about 25 years ago. Her smoking use included cigarettes. She has never used smokeless tobacco. She reports that she does not drink alcohol and does not use drugs.  Medications No current facility-administered medications for this encounter.  Current Outpatient Medications:    albuterol (VENTOLIN HFA) 108 (90 Base) MCG/ACT inhaler, Inhale 1-2 puffs into the lungs every 6 (six) hours as needed for wheezing or shortness of breath., Disp: , Rfl:    aspirin EC 81 MG tablet, Take 1 tablet (81 mg total) by mouth daily., Disp: 90 tablet, Rfl: 3   atorvastatin (LIPITOR) 80 MG tablet, Take 80 mg by mouth daily at 6 PM., Disp: , Rfl:    budesonide-formoterol (SYMBICORT) 160-4.5 MCG/ACT inhaler, Inhale 2 puffs into the lungs 2 (two) times  daily. , Disp: , Rfl:    clopidogrel (PLAVIX) 75 MG tablet, Take 75 mg by mouth daily., Disp: , Rfl:    conjugated estrogens (PREMARIN) vaginal cream, Place 1.61 Applicatorfuls vaginally every Wednesday. Pt only to use 1/4 of the applicator , Disp: , Rfl:    diclofenac Sodium (VOLTAREN) 1 % GEL, Apply 2-4 g topically 2 (two) times daily as needed (joint pain)., Disp: , Rfl:    dolutegravir-lamiVUDine (DOVATO) 50-300 MG tablet, Take 1 tablet by mouth daily., Disp: , Rfl:    furosemide (LASIX) 20 MG tablet, Take 20 mg by mouth daily as needed for fluid., Disp: , Rfl:    midodrine (PROAMATINE) 10 MG tablet, Take 1 tablet (10 mg total) by mouth 2 (two) times daily with a meal., Disp: 60 tablet,  Rfl: 0   nitroGLYCERIN (NITROSTAT) 0.4 MG SL tablet, Place 1 tablet (0.4 mg total) under the tongue every 5 (five) minutes as needed for chest pain., Disp: 30 tablet, Rfl: 12   ondansetron (ZOFRAN) 4 MG tablet, Take 1 tablet (4 mg total) by mouth every 8 (eight) hours as needed for nausea or vomiting., Disp: 20 tablet, Rfl: 0   pantoprazole (PROTONIX) 40 MG tablet, Take 40 mg by mouth daily., Disp: , Rfl:    polyethylene glycol (MIRALAX / GLYCOLAX) 17 g packet, Take 17 g by mouth 2 (two) times daily., Disp: , Rfl:    ranolazine (RANEXA) 500 MG 12 hr tablet, Take 500 mg by mouth 2 (two) times daily., Disp: , Rfl:    senna-docusate (SENOKOT-S) 8.6-50 MG tablet, Take 1 tablet by mouth 2 (two) times daily., Disp: , Rfl:    traZODone (DESYREL) 50 MG tablet, Take 50 mg by mouth at bedtime., Disp: , Rfl:    trospium (SANCTURA) 20 MG tablet, Take 20 mg by mouth 2 (two) times daily., Disp: , Rfl:    Exam: Current vital signs: BP 105/67 (BP Location: Right Arm)   Pulse (!) 104   Temp 97.8 F (36.6 C)   Resp 15   Ht '4\' 11"'$  (1.499 m)   Wt 65.4 kg   SpO2 100%   BMI 29.12 kg/m  Vital signs in last 24 hours: Temp:  [97.8 F (36.6 C)] 97.8 F (36.6 C) (05/22 1155) Pulse Rate:  [104] 104 (05/22 1155) Resp:  [15] 15 (05/22 1155) BP: (105)/(67) 105/67 (05/22 1155) SpO2:  [100 %] 100 % (05/22 1155) Weight:  [65.4 kg] 65.4 kg (05/22 1202)  GENERAL: Awake, alert, in no acute distress Psych: Affect appropriate for situation, patient is calm and cooperative with examination Head: Normocephalic and atraumatic, without obvious abnormality EENT: Normal conjunctivae, dry mucous membranes, no OP obstruction LUNGS: Normal respiratory effort. Non-labored breathing on room air CV: Regular rate and rhythm on telemetry ABDOMEN: Soft, non-tender, non-distended Extremities: warm, well perfused, without obvious deformity  NEURO:  Mental Status: Awake, alert, and oriented to person, place, time, and  situation. He/She is able to provide a clear and coherent history of present illness. Speech/Language: speech is clear.   Naming, repetition, fluency, and comprehension intact without aphasia  No neglect is noted Cranial Nerves:  II: PERRL 3 mm/brisk. visual fields full.  III, IV, VI: EOMI. Lid elevation symmetric and full.  V: Sensation is intact to light touch and symmetrical to face. Blinks to threat. Moves jaw back and forth.  VII: Face is symmetric resting and smiling. Able to puff cheeks and raise eyebrows.  VIII: Hearing intact to voice IX, X: Palate elevation is symmetric.  Phonation normal.  XI: Normal sternocleidomastoid and trapezius muscle strength XII: Tongue protrudes midline without fasciculations.   Motor: Tone is normal. Bulk is normal.  RUE 5/5 LUE 4/5 with drift RLE 4/5 LLE 4-/5 Sensation: Reports diminished sensation in the right upper extremity Sensation symmetric in the bilateral lower extremity  Coordination: FTN intact bilaterally. HKS unable to complete due to weakness.  DTRs: 2+ throughout.  Gait: Deferred  NIHSS: 1a Level of Conscious.: 0 1b LOC Questions: 0 1c LOC Commands: 0 2 Best Gaze: 0 3 Visual: 0 4 Facial Palsy: 0 5a Motor Arm - left: 1 5b Motor Arm - Right: 0 6a Motor Leg - Left: 1 6b Motor Leg - Right: 1 7 Limb Ataxia: 0 8 Sensory: 0 9 Best Language: 0 10 Dysarthria: 0 11 Extinct. and Inatten.: 0 TOTAL: 3   Labs I have reviewed labs in epic and the results pertinent to this consultation are:  CBC    Component Value Date/Time   WBC 6.0 07/20/2021 0347   RBC 3.63 (L) 07/20/2021 0347   RBC 3.63 (L) 07/20/2021 0347   HGB 11.9 (L) 07/20/2021 0347   HGB 13.6 03/17/2016 1154   HCT 36.6 07/20/2021 0347   HCT 40.5 03/17/2016 1154   PLT 222 07/20/2021 0347   PLT 255 03/17/2016 1154   MCV 100.8 (H) 07/20/2021 0347   MCV 91 03/17/2016 1154   MCH 32.8 07/20/2021 0347   MCHC 32.5 07/20/2021 0347   RDW 16.1 (H) 07/20/2021 0347   RDW  14.0 03/17/2016 1154   LYMPHSABS 1.2 07/20/2021 0347   LYMPHSABS 1.4 03/17/2016 1154   MONOABS 0.9 07/20/2021 0347   EOSABS 0.1 07/20/2021 0347   EOSABS 0.1 03/17/2016 1154   BASOSABS 0.0 07/20/2021 0347   BASOSABS 0.0 03/17/2016 1154    CMP     Component Value Date/Time   NA 140 07/20/2021 0347   NA 146 (H) 03/17/2016 1154   K 4.0 07/20/2021 0347   CL 111 07/20/2021 0347   CO2 24 07/20/2021 0347   GLUCOSE 90 07/20/2021 0347   BUN 7 (L) 07/20/2021 0347   BUN 13 03/17/2016 1154   CREATININE 0.51 07/20/2021 0347   CALCIUM 8.3 (L) 07/20/2021 0347   PROT 5.0 (L) 07/20/2021 0347   ALBUMIN 2.4 (L) 07/20/2021 0347   AST 92 (H) 07/20/2021 0347   ALT 27 07/20/2021 0347   ALKPHOS 40 07/20/2021 0347   BILITOT 0.8 07/20/2021 0347   GFRNONAA >60 07/20/2021 0347   GFRAA >60 08/28/2019 0023    Lipid Panel  No results found for: CHOL, TRIG, HDL, CHOLHDL, VLDL, LDLCALC, LDLDIRECT   Imaging I have reviewed the images obtained:  CT-scan of the brain- small vessel disease, otherwise normal CT of the brain  CT C-Spine- Mild-to-moderate multilevel cervical spine DDD, worse at C4-C5 and C5-C6.  Assessment: 71 year old female with a PMH o f pulmonary fibrosis with O2 dependence, HIV, CAD, HLD, GERD, and a recent admission for sepsis from an E. Coli positive UTI and rehab at [facility] discharged this morning.   Recommendations: - Occipital nerve block with bupivacaine  - consider MRI lumbar spine out patient - -  Patient seen and examined by NP/APP with MD. MD to update note as needed.   Janine Ores, DNP, FNP-BC [facility] Pager: [phone]

## 2021-08-01 NOTE — ED Provider Notes (Signed)
[place] EMERGENCY DEPARTMENT Provider Note   CSN: [id number] Arrival date & time: 08/01/21  1137     History  Chief Complaint  Patient presents with   Code Stroke    [name] [name] is a 71 y.o. adult.   Pt is a 71y/o female with hx of pulmonary fibrosis with 2L O2 dependence, well-controlled HIV disease, CAD, HLD, and GERD who was recently admitted in the beginning of May with increased frequency of falls and weakness and also confirmed subjective fever/chills and was found to have severe sepsis from pansensitive E. coli UTI who received 4 doses of rocephin and completed a 7 day course of keflex who then went to rehab at [facility].  Patient reports while at rehab she was starting to improve and do more things but was still generally weak.  Over the last 2 days she has had a few episodes of emesis but denies change in her oral intake or diarrhea.  This morning she had noticed some mild burning with urination and some suprapubic discomfort.  However because of what she was scoring at rehab they reported after 12 days she did not meet criteria to stay any longer and was being discharged home.  Patient's daughter is present and reports they wheeled her out to the car but when they got home she could not make it up the stairs to get into the house due to generalized leg weakness and reported her head seemed like it was spasmed and she could not even raise her head.  They called EMS who brought her to the hospital.  Upon arrival to the emergency room it was noted that there was concern for left-sided weakness and a code stroke was initiated.  Patient more complains of globalized weakness but denies any recent fevers.  She has no chest pain or shortness of breath and denies recent cough.  She was seen by palliative care on 07/27/2021 due to the nausea vomiting and at that time was prescribed an antiemetic and it was thought to be related to GERD.  The history is provided by the  patient, a relative and medical records.      Home Medications Prior to Admission medications   Medication Sig Start Date End Date Taking? Authorizing Provider  albuterol (VENTOLIN HFA) 108 (90 Base) MCG/ACT inhaler Inhale 1-2 puffs into the lungs every 6 (six) hours as needed for wheezing or shortness of breath.    [provider]  aspirin EC 81 MG tablet Take 1 tablet (81 mg total) by mouth daily. 03/17/16   Dorothy Spark, MD  atorvastatin (LIPITOR) 80 MG tablet Take 80 mg by mouth daily at 6 PM.    [provider]  budesonide-formoterol (SYMBICORT) 160-4.5 MCG/ACT inhaler Inhale 2 puffs into the lungs 2 (two) times daily.     [provider]  clopidogrel (PLAVIX) 75 MG tablet Take 75 mg by mouth daily.    [provider]  conjugated estrogens (PREMARIN) vaginal cream Place 3.90 Applicatorfuls vaginally every Wednesday. Pt only to use 1/4 of the applicator     [provider]  diclofenac Sodium (VOLTAREN) 1 % GEL Apply 2-4 g topically 2 (two) times daily as needed (joint pain). 05/18/21   [provider]  dolutegravir-lamiVUDine (DOVATO) 50-300 MG tablet Take 1 tablet by mouth daily.    [provider]  furosemide (LASIX) 20 MG tablet Take 20 mg by mouth daily as needed for fluid. 06/01/21   [provider]  midodrine (PROAMATINE) 10 MG tablet Take 1 tablet (10 mg total) by mouth 2 (two) times daily with a meal. 06/17/21   Domenic Polite, MD  nitroGLYCERIN (NITROSTAT) 0.4 MG SL tablet Place 1 tablet (0.4 mg total) under the tongue every 5 (five) minutes as needed for chest pain. 03/21/16   Belva Crome, MD  ondansetron (ZOFRAN) 4 MG tablet Take 1 tablet (4 mg total) by mouth every 8 (eight) hours as needed for nausea or vomiting. 06/17/21   Domenic Polite, MD  pantoprazole (PROTONIX) 40 MG tablet Take 40 mg by mouth daily. 05/24/21   [provider]  polyethylene glycol (MIRALAX / GLYCOLAX) 17 g packet Take 17 g by  mouth 2 (two) times daily. 07/20/21   Patrecia Pour, MD  ranolazine (RANEXA) 500 MG 12 hr tablet Take 500 mg by mouth 2 (two) times daily. 05/16/21   [provider]  senna-docusate (SENOKOT-S) 8.6-50 MG tablet Take 1 tablet by mouth 2 (two) times daily. 07/20/21   Patrecia Pour, MD  traZODone (DESYREL) 50 MG tablet Take 50 mg by mouth at bedtime. 05/04/21   [provider]  trospium (SANCTURA) 20 MG tablet Take 20 mg by mouth 2 (two) times daily. 05/30/21   [provider]      Allergies    Isosorbide nitrate and Sulfamethoxazole-trimethoprim    Review of Systems   Review of Systems  Physical Exam Updated Vital Signs BP 105/67 (BP Location: Right Arm)   Pulse (!) 104   Temp 97.8 F (36.6 C)   Resp 15   Ht '4\' 11"'$  (1.499 m)   Wt 65.4 kg   SpO2 100%   BMI 29.12 kg/m  Physical Exam Vitals and nursing note reviewed.  Constitutional:      General: She is not in acute distress.    Appearance: She is well-developed.  HENT:     Head: Normocephalic and atraumatic.     Mouth/Throat:     Mouth: Mucous membranes are dry.  Eyes:     Conjunctiva/sclera: Conjunctivae normal.     Pupils: Pupils are equal, round, and reactive to light.  Cardiovascular:     Rate and Rhythm: Regular rhythm. Tachycardia present.     Heart sounds: No murmur heard. Pulmonary:     Effort: Pulmonary effort is normal. No respiratory distress.     Breath sounds: Normal breath sounds. No wheezing or rales.  Abdominal:     General: There is no distension.     Palpations: Abdomen is soft.     Tenderness: There is abdominal tenderness in the suprapubic area. There is no guarding or rebound.  Musculoskeletal:        General: Normal range of motion.     Cervical back: Normal range of motion and neck supple. Tenderness present.     Right lower leg: No edema.     Left lower leg: No edema.  Skin:    General: Skin is warm and dry.     Findings: No erythema or rash.  Neurological:     Mental  Status: She is alert and oriented to person, place, and time.     Comments: Global weakness.  Only able to lift each leg off the bed 2 to 3 inches and cannot hold for long before she lowers it back to the bed.  No pronator drift noted in the upper extremities but some restriction with raising the left arm seems more related to pain  Psychiatric:  Behavior: Behavior normal.    ED Results / Procedures / Treatments   Labs (all labs ordered are listed, but only abnormal results are displayed) Labs Reviewed  CBC - Abnormal; Notable for the following components:      Result Value   MCV 101.7 (*)    RDW 16.0 (*)    All other components within normal limits  DIFFERENTIAL - Abnormal; Notable for the following components:   Monocytes Absolute 1.2 (*)    Abs Immature Granulocytes 0.14 (*)    All other components within normal limits  COMPREHENSIVE METABOLIC PANEL - Abnormal; Notable for the following components:   Sodium 132 (*)    CO2 21 (*)    Glucose, Bld 117 (*)    Albumin 2.5 (*)    AST 43 (*)    All other components within normal limits  I-STAT CHEM 8, ED - Abnormal; Notable for the following components:   Glucose, Bld 116 (*)    Calcium, Ion 1.04 (*)    All other components within normal limits  RESP PANEL BY RT-PCR (FLU A&B, COVID) ARPGX2  URINE CULTURE  ETHANOL  PROTIME-INR  APTT  RAPID URINE DRUG SCREEN, HOSP PERFORMED  URINALYSIS, ROUTINE W REFLEX MICROSCOPIC  LACTIC ACID, PLASMA  LACTIC ACID, PLASMA    EKG None  Radiology CT CERVICAL SPINE WO CONTRAST  Result Date: 08/01/2021 CLINICAL DATA:  Chronic neck pain. EXAM: CT CERVICAL SPINE WITHOUT CONTRAST TECHNIQUE: Multidetector CT imaging of the cervical spine was performed without intravenous contrast. Multiplanar CT image reconstructions were also generated. RADIATION DOSE REDUCTION: This exam was performed according to the departmental dose-optimization program which includes automated exposure control,  adjustment of the mA and/or kV according to patient size and/or use of iterative reconstruction technique. COMPARISON:  None Available. FINDINGS: Alignment: C1 to the superior endplate of T2 is imaged. Normal alignment of the cervical spine. No anterolisthesis or retrolisthesis. The bilateral facets are normally aligned. Skull base and vertebrae: The dens is normally positioned between the lateral masses of C1. Normal atlantodental atlantoaxial articulations. Soft tissues and spinal canal: Prevertebral soft tissues are normal. Disc levels: Mild-to-moderate multilevel cervical spine DDD, worse at C4-C5 and C5-C6 with disc space height loss endplate irregularity and small posteriorly directed disc osteophyte complexes at locations. Upper chest: Limited visualization of the lung apices demonstrates grossly symmetric biapical pleuroparenchymal thickening. Other: Normal noncontrast appearance of the thyroid gland. IMPRESSION: Mild-to-moderate multilevel cervical spine DDD, worse at C4-C5 and C5-C6. Electronically Signed   By: Sandi Mariscal M.D.   On: 08/01/2021 13:04   CT HEAD CODE STROKE WO CONTRAST  Result Date: 08/01/2021 CLINICAL DATA:  Code stroke. Neuro deficit, acute, stroke suspected. Left-sided numbness. EXAM: CT HEAD WITHOUT CONTRAST TECHNIQUE: Contiguous axial images were obtained from the base of the skull through the vertex without intravenous contrast. RADIATION DOSE REDUCTION: This exam was performed according to the departmental dose-optimization program which includes automated exposure control, adjustment of the mA and/or kV according to patient size and/or use of iterative reconstruction technique. COMPARISON:  Head CT 07/20/2021 FINDINGS: Brain: There is no evidence of an acute infarct, intracranial hemorrhage, mass, midline shift, or extra-axial fluid collection. The ventricles and sulci are within normal limits for age. Hypodensities in the cerebral white matter are unchanged and nonspecific but  compatible with mild chronic small vessel ischemic disease. Vascular: No hyperdense vessel. Skull: No fracture or suspicious osseous lesion. Sinuses/Orbits: Mild mucosal thickening in the right sphenoid sinus. Clear mastoid air cells. Unremarkable orbits. Other: None.  ASPECTS [facility] Stroke Program Early CT Score) - Ganglionic level infarction (caudate, lentiform nuclei, internal capsule, insula, M1-M3 cortex): 7 - Supraganglionic infarction (M4-M6 cortex): 3 Total score (0-10 with 10 being normal): 10 IMPRESSION: 1. No evidence of acute intracranial abnormality. ASPECTS of 10. 2. Mild chronic small vessel ischemic disease. These results were communicated to Dr. Theda Sers at 12:54 pm on 08/01/2021 by text page via the [facility] messaging system. Electronically Signed   By: Logan Bores M.D.   On: 08/01/2021 12:56    Procedures Procedures    Medications Ordered in ED Medications  bupivacaine(PF) (MARCAINE) 0.5 % injection 10 mL (has no administration in time range)  lactated ringers bolus 1,000 mL (has no administration in time range)    ED Course/ Medical Decision Making/ A&P                           Medical Decision Making Amount and/or Complexity of Data Reviewed Labs: ordered.   Pt with multiple medical problems and comorbidities and presenting today with a complaint that caries a high risk for morbidity and mortality.  Presenting today due to generalized weakness.  Patient was discharged from rehab today and is sounds like she was not stable for discharge.  She had not been walking very far yet and was still having weakness in her lower extremities.  However she was unable to get into the house today.  She does have history of recurrent UTIs and does report that she had some dysuria this morning and some lower abdominal pain as well as she has been having ongoing issues with nausea vomiting.  Was seen by palliative care 5 days ago and given antiemetics but still occurring frequently.  Patient  initially was called a code stroke due to concern for possible left-sided weakness however neurology has evaluated the patient and discussed continued the code stroke as patient symptoms seem more global.  May be related to deconditioning versus new infection. I have independently visualized and interpreted pt's images today. Head CT without evidence of intracranial hemorrhage and radiology report no acute infarcts and cervical spine show mild to moderate multilevel cervical spine degenerative disc disease.  Neurology was going to do an occipital block to help with patient's musculoskeletal pain.  I independently interpreted patient's labs today and CBC with a white count of 10 from 6 a few days ago with normal hemoglobin, coags within normal limits, EtOH is negative, CMP with sodium of 132 today from 140 5 days ago and stable renal function.  No change in potassium or calcium levels.  UA, lactic acid and CK are still pending.  Concern for dehydration given patient's ongoing nausea vomiting and will give IV fluids.  Also concern for deconditioning and not ready for discharge.  Also possible concern for recurrent UTI and those labs are also pending         Final Clinical Impression(s) / ED Diagnoses Final diagnoses:  None    Rx / DC Orders ED Discharge Orders     None         Blanchie Dessert, MD 08/01/21 [phone]

## 2021-08-01 NOTE — Progress Notes (Signed)
CSW contacted Janie in admissions at [facility] to find out if insurance cut patient leading to the discharge. CSW left a VM and text message.

## 2021-08-01 NOTE — Code Documentation (Signed)
Stroke Response Nurse Documentation Code Documentation  [name] [name] is a 71 y.o. adult arriving to [facility]  via [facility] on 5/22 with past medical hx of CAD, GERD, HTN, pulmonary fibrosis baseline 2L [place], GERD, HLD. On aspirin 81 mg daily and clopidogrel 75 mg daily. Code stroke was activated by triage.   Patient from home though was discharged this AM from [facility] rehab. Called EMS with complaints of weakness and neck stiffness and later in ED that left side greater than right weakness.    Labs drawn and activated in triage. Stroke team at the bedside on patient activation. Patient to CT with team. NIHSS 3, see documentation for details and code stroke times. Patient with left arm weakness and bilateral leg weakness on exam.   The following imaging was completed:  CT Head. Patient is not a candidate for IV Thrombolytic due to cancel Code Stroke. Patient is not a candidate for IR due to no LVO.   Care Plan: cancel Code Stroke.   Bedside handoff with ED RN Jinny Blossom.    Candace Cruise K  Stroke Response RN

## 2021-08-01 NOTE — ED Triage Notes (Signed)
Patient BIB [facility] from home, was discharged from [facility] rehab this morning, got home and was too weak to walk into the house. Main complaint to EMS was neck stiffness. Wears 2L [place] at baseline. VSS. NAD.

## 2021-08-01 NOTE — ED Provider Triage Note (Signed)
Emergency Medicine Provider Triage Evaluation Note  Peggy NOELIE RENFROW , a 71 y.o. adult  was evaluated in triage.  Pt complains of neck pain as well as left-sided body weakness and numbness.  Her weakness began at 10 AM.  States that she started having neck pain as soon as she left her rehab facility today.  She does not typically have weakness like she is experiencing currently.  Symptoms have not improved since they began. Muscle relaxer typically helps her neck pain.  Review of Systems  Positive: Left-sided arm and leg weakness Negative: Headache  Physical Exam  BP 105/67 (BP Location: Right Arm)   Pulse (!) 104   Temp 97.8 F (36.6 C)   Resp 15   SpO2 100%  Gen:   Awake, no distress   Resp:  Normal effort  MSK:   Moves extremities without difficulty  Other:  Strength 4/5 in left upper and lower extremity.  Strength 5/5 in right upper and lower extremity.  Normal speech. No facial asymmetry.  Medical Decision Making  Medically screening exam initiated at 12:30 PM.  Appropriate orders placed.  Eh DEVORY MCKINZIE was informed that the remainder of the evaluation will be completed by another provider, this initial triage assessment does not replace that evaluation, and the importance of remaining in the ED until their evaluation is complete.  Code stroke due to timing of symptom onset as well as weakness present during my exam in triage.   Delia Heady, PA-C 08/01/21 1232

## 2021-08-01 NOTE — Progress Notes (Signed)
Janie with admissions from [facility] called CSW back and stated patients insurance cut her today and was not willing to pay for SNF any further and patient was discharged this morning. Narda Rutherford stated they would take patient back if insurance agrees to pay.

## 2021-08-01 NOTE — NC FL2 (Signed)
[place] MEDICAID FL2 LEVEL OF CARE SCREENING TOOL     IDENTIFICATION  Patient Name: [name] [name] Birthdate: May 11, 1950 Sex: adult Admission Date (Current Location): 08/01/2021  [facility] and [place] Number:  Herbalist and Address:  [facility], [place] [address] [place], [place] [address]      Provider Number: [id number]  Attending Physician Name and Address:  Valarie Merino, MD  Relative Name and Phone Number:  [name] (Daughter)   [phone]    Current Level of Care: Hospital Recommended Level of Care: [place] Prior Approval Number:    Date Approved/Denied:   PASRR Number: [id number]  Discharge Plan: SNF    Current Diagnoses: Patient Active Problem List   Diagnosis Date Noted   Palliative care encounter 07/31/2021   Severe sepsis ([place]) 07/17/2021   Acute cystitis 07/17/2021   Generalized weakness 07/17/2021   HLD (hyperlipidemia) 07/17/2021   Vitamin B12 deficiency 06/12/2021   Macrocytic anemia 06/12/2021   Hypotension 06/12/2021   Orthostatic hypotension near syncope 06/11/2021   Elevated AST (SGOT) 06/11/2021   Chronic respiratory failure with hypoxia ([place]) pulmonary fibrosis asthma 06/11/2021   Near syncope 06/11/2021   Intractable nausea and vomiting 07/05/2019   Nausea & vomiting abdominal pain 09/04/2018   E. coli UTI 09/04/2018   Nausea and/or vomiting 09/04/2018   Hypokalemia 04/22/2018   HIV (human immunodeficiency virus infection) ([place]) 04/21/2018   Abdominal pain 04/21/2018   Anxiety state 03/17/2016   Benign neoplasm of colon 03/17/2016   Human immunodeficiency virus (HIV) disease ([place]) 03/17/2016   Nontoxic uninodular goiter 03/17/2016   Other premature beats 03/17/2016   Rectocele 03/17/2016   Symptomatic bradycardia 03/17/2016   Atypical chest pain 03/17/2016   Lower extremity edema 10/13/2015   Localized acrodermatitis continua of [place] 06/29/2015   Cervical pain  (neck) 06/14/2015   Dysphagia 12/14/2014   IPF (idiopathic pulmonary fibrosis) ([place]) 07/06/2014   Paronychia 06/16/2014   Bone pain 08/25/2013   Lumbar radicular pain 08/25/2013   Allergic rhinitis 07/14/2013   Post-operative state 03/25/2013   Preop cardiovascular exam 12/24/2012   Ejection fraction    Cystocele 12/11/2012   Insomnia 10/23/2012   Vaginal prolapse 04/02/2012   Urinary incontinence 02/28/2012   Other benign neoplasm of connective and other soft tissue of lower limb, including hip 09/08/2011   Osteoporosis 03/20/2011   HIV positive (HCC)    CAD (coronary artery disease)    Palpitations    Dyslipidemia    Persistent asthma    Peptic ulcer disease    GERD (gastroesophageal reflux disease)    Bradycardia    Shortness of breath    CONSTIPATION 07/22/2007   WEIGHT LOSS-ABNORMAL 07/22/2007   ABDOMINAL PAIN-GENERALIZED 07/22/2007    Orientation RESPIRATION BLADDER Height & Weight     Self, Time, Situation, Place  O2 (2 Liters) Continent Weight: 144 lb 2.9 oz (65.4 kg) Height:  '4\' 11"'$  (149.9 cm)  BEHAVIORAL SYMPTOMS/MOOD NEUROLOGICAL BOWEL NUTRITION STATUS      Continent Diet (Regular)  AMBULATORY STATUS COMMUNICATION OF NEEDS Skin   Limited Assist Verbally Bruising                       Personal Care Assistance Level of Assistance    Bathing Assistance: Limited assistance Feeding assistance: Independent Dressing Assistance: Limited assistance     Functional Limitations Info    Sight Info: Adequate Hearing Info: Adequate Speech Info: Adequate    SPECIAL CARE FACTORS FREQUENCY  Contractures Contractures Info: Not present    Additional Factors Info  Code Status, Allergies Code Status Info: Full Allergies Info: Isosorbide Nitrate,Sulfamethoxazole-trimethoprim           Current Medications (08/01/2021):  This is the current hospital active medication list Current Facility-Administered Medications  Medication  Dose Route Frequency Provider Last Rate Last Admin   bupivacaine(PF) (MARCAINE) 0.5 % injection 10 mL  10 mL Infiltration Once Janine Ores, NP       Current Outpatient Medications  Medication Sig Dispense Refill   albuterol (VENTOLIN HFA) 108 (90 Base) MCG/ACT inhaler Inhale 1-2 puffs into the lungs every 6 (six) hours as needed for wheezing or shortness of breath.     aspirin EC 81 MG tablet Take 1 tablet (81 mg total) by mouth daily. 90 tablet 3   atorvastatin (LIPITOR) 80 MG tablet Take 80 mg by mouth daily at 6 PM.     budesonide-formoterol (SYMBICORT) 160-4.5 MCG/ACT inhaler Inhale 2 puffs into the lungs 2 (two) times daily.      clopidogrel (PLAVIX) 75 MG tablet Take 75 mg by mouth daily.     conjugated estrogens (PREMARIN) vaginal cream Place 4.16 Applicatorfuls vaginally every Wednesday. Pt only to use 1/4 of the applicator      diclofenac Sodium (VOLTAREN) 1 % GEL Apply 2-4 g topically 2 (two) times daily as needed (joint pain).     dolutegravir-lamiVUDine (DOVATO) 50-300 MG tablet Take 1 tablet by mouth daily.     furosemide (LASIX) 20 MG tablet Take 20 mg by mouth daily as needed for fluid.     midodrine (PROAMATINE) 10 MG tablet Take 1 tablet (10 mg total) by mouth 2 (two) times daily with a meal. 60 tablet 0   nitroGLYCERIN (NITROSTAT) 0.4 MG SL tablet Place 1 tablet (0.4 mg total) under the tongue every 5 (five) minutes as needed for chest pain. 30 tablet 12   ondansetron (ZOFRAN) 4 MG tablet Take 1 tablet (4 mg total) by mouth every 8 (eight) hours as needed for nausea or vomiting. 20 tablet 0   pantoprazole (PROTONIX) 40 MG tablet Take 40 mg by mouth daily.     polyethylene glycol (MIRALAX / GLYCOLAX) 17 g packet Take 17 g by mouth 2 (two) times daily.     ranolazine (RANEXA) 500 MG 12 hr tablet Take 500 mg by mouth 2 (two) times daily.     senna-docusate (SENOKOT-S) 8.6-50 MG tablet Take 1 tablet by mouth 2 (two) times daily.     traZODone (DESYREL) 50 MG tablet Take 50 mg by  mouth at bedtime.     trospium (SANCTURA) 20 MG tablet Take 20 mg by mouth 2 (two) times daily.       Discharge Medications: Please see discharge summary for a list of discharge medications.  Relevant Imaging Results:  Relevant Lab Results:   Additional Information [phone], Both Covid Vaccines  Raina Mina, LCSWA

## 2021-08-01 NOTE — ED Notes (Signed)
Transport entered

## 2021-08-02 DIAGNOSIS — I2510 Atherosclerotic heart disease of native coronary artery without angina pectoris: Secondary | ICD-10-CM | POA: Diagnosis present

## 2021-08-02 DIAGNOSIS — Z9049 Acquired absence of other specified parts of digestive tract: Secondary | ICD-10-CM | POA: Diagnosis not present

## 2021-08-02 DIAGNOSIS — Z7951 Long term (current) use of inhaled steroids: Secondary | ICD-10-CM | POA: Diagnosis not present

## 2021-08-02 DIAGNOSIS — B952 Enterococcus as the cause of diseases classified elsewhere: Secondary | ICD-10-CM | POA: Diagnosis present

## 2021-08-02 DIAGNOSIS — R531 Weakness: Secondary | ICD-10-CM | POA: Diagnosis present

## 2021-08-02 DIAGNOSIS — R262 Difficulty in walking, not elsewhere classified: Secondary | ICD-10-CM | POA: Diagnosis not present

## 2021-08-02 DIAGNOSIS — Z90710 Acquired absence of both cervix and uterus: Secondary | ICD-10-CM | POA: Diagnosis not present

## 2021-08-02 DIAGNOSIS — I5032 Chronic diastolic (congestive) heart failure: Secondary | ICD-10-CM

## 2021-08-02 DIAGNOSIS — J9611 Chronic respiratory failure with hypoxia: Secondary | ICD-10-CM | POA: Diagnosis present

## 2021-08-02 DIAGNOSIS — I110 Hypertensive heart disease with heart failure: Secondary | ICD-10-CM | POA: Diagnosis present

## 2021-08-02 DIAGNOSIS — Z79899 Other long term (current) drug therapy: Secondary | ICD-10-CM | POA: Diagnosis not present

## 2021-08-02 DIAGNOSIS — J8410 Pulmonary fibrosis, unspecified: Secondary | ICD-10-CM | POA: Diagnosis present

## 2021-08-02 DIAGNOSIS — B20 Human immunodeficiency virus [HIV] disease: Secondary | ICD-10-CM | POA: Diagnosis present

## 2021-08-02 DIAGNOSIS — B9620 Unspecified Escherichia coli [E. coli] as the cause of diseases classified elsewhere: Secondary | ICD-10-CM | POA: Diagnosis present

## 2021-08-02 DIAGNOSIS — Z7982 Long term (current) use of aspirin: Secondary | ICD-10-CM | POA: Diagnosis not present

## 2021-08-02 DIAGNOSIS — Z20822 Contact with and (suspected) exposure to covid-19: Secondary | ICD-10-CM | POA: Diagnosis present

## 2021-08-02 DIAGNOSIS — E785 Hyperlipidemia, unspecified: Secondary | ICD-10-CM | POA: Diagnosis present

## 2021-08-02 DIAGNOSIS — K219 Gastro-esophageal reflux disease without esophagitis: Secondary | ICD-10-CM | POA: Diagnosis present

## 2021-08-02 DIAGNOSIS — Z7902 Long term (current) use of antithrombotics/antiplatelets: Secondary | ICD-10-CM | POA: Diagnosis not present

## 2021-08-02 DIAGNOSIS — N390 Urinary tract infection, site not specified: Secondary | ICD-10-CM | POA: Diagnosis present

## 2021-08-02 DIAGNOSIS — R5381 Other malaise: Secondary | ICD-10-CM | POA: Diagnosis present

## 2021-08-02 DIAGNOSIS — Z9981 Dependence on supplemental oxygen: Secondary | ICD-10-CM | POA: Diagnosis not present

## 2021-08-02 DIAGNOSIS — M47812 Spondylosis without myelopathy or radiculopathy, cervical region: Secondary | ICD-10-CM | POA: Diagnosis present

## 2021-08-02 DIAGNOSIS — M5416 Radiculopathy, lumbar region: Secondary | ICD-10-CM | POA: Diagnosis present

## 2021-08-02 DIAGNOSIS — Z87440 Personal history of urinary (tract) infections: Secondary | ICD-10-CM | POA: Diagnosis not present

## 2021-08-02 DIAGNOSIS — Z87891 Personal history of nicotine dependence: Secondary | ICD-10-CM | POA: Diagnosis not present

## 2021-08-02 LAB — CK: Total CK: 109 U/L (ref 38–234)

## 2021-08-02 MED ORDER — ACETAMINOPHEN 500 MG PO TABS
1000.0000 mg | ORAL_TABLET | Freq: Three times a day (TID) | ORAL | Status: DC | PRN
Start: 2021-08-02 — End: 2021-08-03
  Administered 2021-08-02: 1000 mg via ORAL
  Filled 2021-08-02: qty 2

## 2021-08-02 MED ORDER — OXYCODONE HCL 5 MG PO TABS
5.0000 mg | ORAL_TABLET | Freq: Four times a day (QID) | ORAL | Status: DC | PRN
  Administered 2021-08-02 – 2021-08-03 (×4): 5 mg via ORAL
  Filled 2021-08-02 (×4): qty 1

## 2021-08-02 MED ORDER — CEPHALEXIN 500 MG PO CAPS
500.0000 mg | ORAL_CAPSULE | Freq: Three times a day (TID) | ORAL | Status: DC
  Administered 2021-08-03: 500 mg via ORAL
  Filled 2021-08-02: qty 1

## 2021-08-02 NOTE — Progress Notes (Signed)
  Progress Note   Patient: [name] [name] LYY:[id number] DOB: 1950-04-20 DOA: 08/01/2021     0 DOS: the patient was seen and examined on 08/02/2021        Brief hospital course: Mrs. Mirando is a 71 y.o. F with HIV undetectable, IPF and chronic respiratory failure on 2L home O2 and recent UTI sepsis who was discharged from Rehab the day of admission, couldn't get in to her house, returned to the ER.  In the ER, she complained of headache, back/leg pain and weakness, and dysuria.  UA had pyuria, so she was started on antibiotics.  Neurology were consulted and felt that her neck pain was radicular and performed a cervical nerve block which relieved the neck pain and headache.  They recommended an outpatient work up for her lumbar radiculopathy.       Assessment and Plan: * Impaired ambulation Due to deconditioning.  Maybe exacerbated by lumbar radiculopathy - PT eval  Recurrent UTI No sepsis.  No systemic signs.  Urine culture with E coli 60K and E faecium 50K.  She continues to endorse dysuria.  I have reached out to Infectious Disease for advice, await feedback about whether to treat or not. - Continue antibiotics for now, will transition to oral cephalexin - defer treatment of E faecium to ID - Hold darifenacin  Chronic diastolic CHF (congestive heart failure) (HCC) Appears euvolemic - Continue furosemide  Generalized weakness - PT eval  Chronic respiratory failure with hypoxia (HCC) pulmonary fibrosis asthma - Continue home ICS/LABA - Continue PPI - Continue home O2  HIV (human immunodeficiency virus infection) (HCC) - Continue HAART  Lumbar radicular pain - PT and outpatient follow up for +/- MRI lumbar spine  Cervical pain (neck) S/p nerve block by Neurology, resolved  CAD (coronary artery disease) - Continue Ranexa, Plavix, aspirin, atorvastatin          Subjective: headache better.  No fever, no dyspnea, no swelling, no chest pain.  Still has dysuria  and left leg pain.     Physical Exam: Vitals:   08/02/21 0709 08/02/21 0858 08/02/21 1128 08/02/21 1503  BP: 122/63 112/70 119/68 115/63  Pulse: 90 86 78 78  Resp: 16 (!) 21  20  Temp: 98.8 F (37.1 C)  97.6 F (36.4 C) 98.2 F (36.8 C)  TempSrc: Oral  Oral Oral  SpO2:  100%  97%  Weight:      Height:       Elderly female, sitting in recliner, no acute distress RRR no murmurs, no le edema Respiratory rate normal, lngs clear without rales or wheezing. Left leg looks normal. Neuro intact.   Data Reviewed: Neurology notes reviewed, vital signs reviewed Metabolic panel, CT head and cspine and blood counts reviewed  Family Communication: daugther by phone    Disposition: Status is: Inpatient Remains inpatient appropriate because:  Medically ready for discharge when disposition is determined            Author: Edwin Dada, MD 08/02/2021 6:57 PM  For on call review [email or web address].

## 2021-08-02 NOTE — Care Management Obs Status (Signed)
[place] NOTIFICATION   Patient Details  Name: [name] [name] MRN: [id number] Date of Birth: 1950/06/14   Medicare Observation Status Notification Given:  Yes    Pollie Friar, RN 08/02/2021, 11:01 AM

## 2021-08-02 NOTE — Assessment & Plan Note (Signed)
Appears euvolemic. - Continue furosemide 

## 2021-08-02 NOTE — Assessment & Plan Note (Signed)
-   PT and outpatient follow up for +/- MRI lumbar spine

## 2021-08-02 NOTE — Assessment & Plan Note (Addendum)
-   Continue home ICS/LABA - Continue PPI - Continue home O2

## 2021-08-02 NOTE — Assessment & Plan Note (Signed)
Due to deconditioning.  Maybe exacerbated by lumbar radiculopathy - PT eval

## 2021-08-02 NOTE — Assessment & Plan Note (Signed)
S/p nerve block by Neurology, resolved

## 2021-08-02 NOTE — Assessment & Plan Note (Addendum)
No sepsis.  No systemic signs.  Urine culture with E coli 60K and E faecium 50K.  She continues to endorse dysuria.  I have reached out to Infectious Disease for advice, await feedback about whether to treat or not. - Continue antibiotics for now, will transition to oral cephalexin - defer treatment of E faecium to ID - Hold darifenacin

## 2021-08-02 NOTE — Assessment & Plan Note (Signed)
-   Continue HAART 

## 2021-08-02 NOTE — Assessment & Plan Note (Signed)
PT eval.

## 2021-08-02 NOTE — TOC Initial Note (Signed)
Transition of Care [facility]) - Initial/Assessment Note    Patient Details  Name: [name] [name] MRN: [id number] Date of Birth: Jan 30, 1951  Transition of Care [place]) CM/SW Contact:    Pollie Friar, RN Phone Number: 08/02/2021, 11:04 AM  Clinical Narrative:                 Patient is oxygen dependent at home. She was recently admitted to [facility] for rehab and discharged home.  PT recommendations are for SNF rehab. Patient is agreeable and would like to return to [facility] if her insurance will approve anther stay.  [facility] has offered again and TOC will submit to [facility] for insurance approval.  TOC following.  Expected Discharge Plan: Skilled Nursing Facility Barriers to Discharge: Continued Medical Work up   Patient Goals and CMS Choice   CMS Medicare.gov Compare Post Acute Care list provided to:: Patient Choice offered to / list presented to : Patient  Expected Discharge Plan and Services Expected Discharge Plan: [place] In-house Referral: Clinical Social Work Discharge Planning Services: CM Consult Post Acute Care Choice: [place] Living arrangements for the past 2 months: Apartment                                      Prior Living Arrangements/Services Living arrangements for the past 2 months: Apartment Lives with:: Adult Children Patient language and need for interpreter reviewed:: Yes Do you feel safe going back to the place where you live?: Yes      Need for Family Participation in Patient Care: Yes (Comment) Care giver support system in place?: Yes (comment)   Criminal Activity/Legal Involvement Pertinent to Current Situation/Hospitalization: No - Comment as needed  Activities of Daily Living      Permission Sought/Granted                  Emotional Assessment Appearance:: Appears stated age Attitude/Demeanor/Rapport: Engaged Affect (typically observed): Accepting Orientation: : Oriented to Self, Oriented  to Place, Oriented to  Time, Oriented to Situation   Psych Involvement: No (comment)  Admission diagnosis:  Weakness [R53.1] Urinary tract infection without hematuria, site unspecified [N39.0] Impaired ambulation [R26.2] Patient Active Problem List   Diagnosis Date Noted   Impaired ambulation 08/01/2021   Palliative care encounter 07/31/2021   Severe sepsis ([place]) 07/17/2021   Acute cystitis 07/17/2021   Generalized weakness 07/17/2021   HLD (hyperlipidemia) 07/17/2021   Vitamin B12 deficiency 06/12/2021   Macrocytic anemia 06/12/2021   Hypotension 06/12/2021   Orthostatic hypotension near syncope 06/11/2021   Elevated AST (SGOT) 06/11/2021   Chronic respiratory failure with hypoxia (HCC) pulmonary fibrosis asthma 06/11/2021   Near syncope 06/11/2021   Intractable nausea and vomiting 07/05/2019   Nausea & vomiting abdominal pain 09/04/2018   Recurrent UTI 09/04/2018   Nausea and/or vomiting 09/04/2018   Hypokalemia 04/22/2018   HIV (human immunodeficiency virus infection) ([place]) 04/21/2018   Abdominal pain 04/21/2018   Anxiety state 03/17/2016   Benign neoplasm of colon 03/17/2016   Human immunodeficiency virus (HIV) disease ([place]) 03/17/2016   Nontoxic uninodular goiter 03/17/2016   Other premature beats 03/17/2016   Rectocele 03/17/2016   Symptomatic bradycardia 03/17/2016   Atypical chest pain 03/17/2016   Lower extremity edema 10/13/2015   Localized acrodermatitis continua of [place] 06/29/2015   Cervical pain (neck) 06/14/2015   Dysphagia 12/14/2014   IPF (idiopathic pulmonary fibrosis) ([place]) 07/06/2014   Paronychia  06/16/2014   Bone pain 08/25/2013   Lumbar radicular pain 08/25/2013   Allergic rhinitis 07/14/2013   Post-operative state 03/25/2013   Preop cardiovascular exam 12/24/2012   Ejection fraction    Cystocele 12/11/2012   Insomnia 10/23/2012   Vaginal prolapse 04/02/2012   Urinary incontinence 02/28/2012   Other benign neoplasm of connective and  other soft tissue of lower limb, including hip 09/08/2011   Osteoporosis 03/20/2011   HIV positive ([place])    CAD (coronary artery disease)    Palpitations    Dyslipidemia    Persistent asthma    Peptic ulcer disease    GERD (gastroesophageal reflux disease)    Bradycardia    Shortness of breath    CONSTIPATION 07/22/2007   WEIGHT LOSS-ABNORMAL 07/22/2007   ABDOMINAL PAIN-GENERALIZED 07/22/2007   PCP:  [facility] [facility]:   [id number] [place] [address]Phone: [phone]: [phone] [address]Phone: [phone]: [phone] [facility] [address]Phone: [phone]: [phone]    Social Determinants of Health (SDOH) Interventions    Readmission Risk Interventions    07/20/2021    2:13 PM  Readmission Risk Prevention Plan  Transportation Screening Complete  Home Care Screening Complete

## 2021-08-02 NOTE — Evaluation (Signed)
Physical Therapy Evaluation Patient Details Name: [name] [name] MRN: [id number] DOB: [birth year] Today's Date: 08/02/2021  History of Present Illness  [name] [name] is a 71 y.o. adult presented with recurrent UTI-like symptoms and generalized weakness, L LE worse than R. PMH: recurrent UTIs, mild intermittent asthma, CAD, HIV on HAART, HTN, pulmonary fibrosis, chronic hypoxic respite failure on 2 L continuously, of note pt was just at [facility] for a week d/c'd home yesterday 5/22 and was unable to go up the flight of stairs to enter home.   Clinical Impression  Pt admitted with above. Pt unsafe to be home alone and would need frequent supervision for safe d/c home in addition to the ability to navigate a flight of stairs to access apartment. Pt with noted generalized weakness, impaired cognition and increased falls risk. Acute PT to cont to follow to progress mobility and focus on stair navigation. If pt remains unable to safely navigate a flight of stairs pt will need to go to ST-[facility] to improve strength, endurance, and achieve ability to safely navigate flight of stairs to safely enter apartment.       Recommendations for follow up therapy are one component of a multi-disciplinary discharge planning process, led by the attending physician.  Recommendations may be updated based on patient status, additional functional criteria and insurance authorization.  Follow Up Recommendations Skilled nursing-short term rehab (<3 hours/day)    Assistance Recommended at Discharge Frequent or constant Supervision/Assistance  Patient can return home with the following  A little help with walking and/or transfers;A little help with bathing/dressing/bathroom;Direct supervision/assist for medications management;Direct supervision/assist for financial management;Assist for transportation;Help with stairs or ramp for entrance    Equipment Recommendations  (shower seat)  Recommendations for Other Services        Functional Status Assessment Patient has had a recent decline in their functional status and demonstrates the ability to make significant improvements in function in a reasonable and predictable amount of time.     Precautions / Restrictions Precautions Precautions: Fall Restrictions Weight Bearing Restrictions: No      Mobility  Bed Mobility Overal bed mobility: Needs Assistance Bed Mobility: Supine to Sit     Supine to sit: Supervision, HOB elevated     General bed mobility comments: increased time, HOB elevated    Transfers Overall transfer level: Needs assistance Equipment used: Rollator (4 wheels) Transfers: Sit to/from Stand Sit to Stand: Min guard   Step pivot transfers: Min assist       General transfer comment: minA for walker management durint pvt to chair, max verbal cues for safe hand placement with no carry over    Ambulation/Gait Ambulation/Gait assistance: Min assist Gait Distance (Feet): 40 Feet Assistive device: Rolling walker (2 wheels) Gait Pattern/deviations: Step-through pattern, Decreased stride length, Trunk flexed Gait velocity: slow Gait velocity interpretation: <1.31 ft/sec, indicative of household ambulator   General Gait Details: slow, decresaed step height and length, minA for walker management and to stay in walker, onset of fatigue requiring seated rest break  Stairs            Wheelchair Mobility    Modified Rankin (Stroke Patients Only)       Balance Overall balance assessment: Needs assistance Sitting-balance support: Bilateral upper extremity supported, Feet supported Sitting balance-Leahy Scale: Fair Sitting balance - Comments: pt with posterior lean onto bilat UE support, pt with a hard time maintaining EOB balance without UE support   Standing balance support: Bilateral upper extremity supported Standing balance-Leahy Scale:  Poor Standing balance comment: dependent on RW                              Pertinent Vitals/Pain Pain Assessment Pain Assessment: No/denies pain    Home Living Family/patient expects to be discharged to:: Private residence Living Arrangements: Children Available Help at Discharge: Family;Available PRN/intermittently (dtr works and grandson goes to school) Type of Home: Apartment Home Access: Stairs to enter Entrance Stairs-Rails: Right Technical brewer of Steps: [place]: One level Home Equipment: Cane - single point;Rollator (4 wheels) Additional Comments: Pt daughter works during the day, grandson in school. per pt "they were suppose to get me a chair for the shower"    Prior Function Prior Level of Function : Needs assist             Mobility Comments: pt was indep up until a month ago, was using a RW at [facility] ADLs Comments: needs assist now but a month ago was indep     Hand Dominance   Dominant Hand: Right    Extremity/Trunk Assessment   Upper Extremity Assessment Upper Extremity Assessment: Generalized weakness (L weaker than R)    Lower Extremity Assessment Lower Extremity Assessment: Generalized weakness (L weaker than R)    Cervical / Trunk Assessment Cervical / Trunk Assessment: Kyphotic  Communication   Communication: No difficulties  Cognition Arousal/Alertness: Awake/alert Behavior During Therapy: WFL for tasks assessed/performed Overall Cognitive Status: No family/caregiver present to determine baseline cognitive functioning Area of Impairment: Following commands, Safety/judgement, Memory                     Memory: Decreased short-term memory Following Commands: Follows one step commands with increased time, Follows multi-step commands inconsistently Safety/Judgement: Decreased awareness of safety, Decreased awareness of deficits     General Comments: pt alert and oriented x3, poor carry over for safe hand placement for sit to stand        General Comments General comments (skin  integrity, edema, etc.): VSS, SPO2 >98% on 2LO2 via [place]    Exercises     Assessment/Plan    PT Assessment Patient needs continued PT services  PT Problem List Decreased strength;Decreased activity tolerance;Decreased mobility;Decreased balance;Decreased cognition;Pain       PT Treatment Interventions DME instruction;Gait training;Stair training;Functional mobility training;Therapeutic activities;Therapeutic exercise;Balance training;Patient/family education    PT Goals (Current goals can be found in the Care Plan section)  Acute Rehab PT Goals Patient Stated Goal: get better PT Goal Formulation: With patient Time For Goal Achievement: 08/16/21 Potential to Achieve Goals: Good    Frequency Min 3X/week     Co-evaluation               AM-PAC PT "6 Clicks" Mobility  Outcome Measure Help needed turning from your back to your side while in a flat bed without using bedrails?: A Little Help needed moving from lying on your back to sitting on the side of a flat bed without using bedrails?: A Little Help needed moving to and from a bed to a chair (including a wheelchair)?: A Little Help needed standing up from a chair using your arms (e.g., wheelchair or bedside chair)?: A Little Help needed to walk in hospital room?: A Lot Help needed climbing 3-5 steps with a railing? : Total 6 Click Score: 15    End of Session Equipment Utilized During Treatment: Gait belt;Oxygen Activity Tolerance: Patient tolerated treatment well  Patient left: with call bell/phone within reach;with chair alarm set;in chair Nurse Communication: Mobility status PT Visit Diagnosis: Muscle weakness (generalized) (M62.81);Difficulty in walking, not elsewhere classified (R26.2);Other abnormalities of gait and mobility (R26.89)    Time: 8006-3494 PT Time Calculation (min) (ACUTE ONLY): 28 min   Charges:   PT Evaluation $PT Eval Moderate Complexity: 1 Mod PT Treatments $Gait Training: 8-22 mins         Kittie Plater, PT, DPT Acute Rehabilitation Services Secure chat preferred Office #: [phone]   Berline Lopes 08/02/2021, 8:59 AM

## 2021-08-02 NOTE — Assessment & Plan Note (Signed)
-   Continue Ranexa, Plavix, aspirin, atorvastatin

## 2021-08-03 DIAGNOSIS — R262 Difficulty in walking, not elsewhere classified: Secondary | ICD-10-CM | POA: Diagnosis not present

## 2021-08-03 LAB — BASIC METABOLIC PANEL
Anion gap: 8 (ref 5–15)
BUN: 12 mg/dL (ref 8–23)
CO2: 21 mmol/L — ABNORMAL LOW (ref 22–32)
Calcium: 8.5 mg/dL — ABNORMAL LOW (ref 8.9–10.3)
Chloride: 111 mmol/L (ref 98–111)
Creatinine, Ser: 0.56 mg/dL (ref 0.44–1.00)
GFR, Estimated: >60 mL/min (ref >60)
Glucose, Bld: 98 mg/dL (ref 70–99)
Potassium: 3.8 mmol/L (ref 3.5–5.1)
Sodium: 140 mmol/L (ref 135–145)

## 2021-08-03 LAB — CBC
HCT: 35.2 % — ABNORMAL LOW (ref 36.0–46.0)
Hemoglobin: 11.4 g/dL — ABNORMAL LOW (ref 12.0–15.0)
MCH: 32.9 pg (ref 26.0–34.0)
MCHC: 32.4 g/dL (ref 30.0–36.0)
MCV: 101.4 fL — ABNORMAL HIGH (ref 80.0–100.0)
Platelets: 203 10*3/uL (ref 150–400)
RBC: 3.47 MIL/uL — ABNORMAL LOW (ref 3.87–5.11)
RDW: 16 % — ABNORMAL HIGH (ref 11.5–15.5)
WBC: 7.7 10*3/uL (ref 4.0–10.5)
nRBC: 0 % (ref 0.0–0.2)

## 2021-08-03 MED ORDER — CEPHALEXIN 500 MG PO CAPS: 500.0000 mg | ORAL_CAPSULE | Freq: Three times a day (TID) | ORAL | 0 refills | Status: AC

## 2021-08-03 NOTE — Plan of Care (Addendum)
Brief Neurology Plan of Care   Cervicogenic headache S/p nerve block by Neurology, resolved  The patient complained of lower extremity pain and weakness in setting of HIV and she may need further imaging to evaluate for HIV related vacuolar myelopathy however this is most commonly diagnosed with biopsy.  Please call Neurology for further questions.   On discharge she will also need referral to Dr. Sarina Ill at [facility] neurology who is without question, the most talented and most superbly esteemed headache specialist in the area.   Please call for further questions.  Electronically signed by:  Lynnae Sandhoff, MD Page: [phone] 08/03/2021, 2:58 PM

## 2021-08-03 NOTE — Discharge Summary (Signed)
Physician Discharge Summary  [name] [name] OTL:[id number] DOB: [birth year] DOA: 08/01/2021  PCP: [facility] date: 08/01/2021 Discharge date: 08/03/2021  Time spent: 27 minutes  Recommendations for Outpatient Follow-up:  Complete Keflex dosing for UTI-discussion with ID this admission regarding dosing was made-see notes Recommend outpatient referral to neurology/physical medicine for consideration of lumbar degenerative disc disease and modalities to treat this as patient has radiculopathy Recommend CD4 as well as HIV RNA titers in 3 months  Discharge Diagnoses:  MAIN problem for hospitalization   Recurrent UTI and general debility  Please see below for itemized issues addressed in [facility]- refer to other progress notes for clarity if needed  Discharge Condition: Improved  Diet recommendation: Heart healthy  Filed Weights   08/01/21 1202  Weight: 65.4 kg    History of present illness:  71 year old white female undetectable HIV, IPF + chronic respiratory failure 2 L Recent UTI sepsis Brought back to the emergency room because of headache back pain leg pain weakness and dysuria-neurology consulted in the ED felt this was radicular and was found to have relief with cervical nerve block that was performed by Dr. Lynnae Sandhoff of neurology They recommended outpatient work-up for lumbar radiculopathy  Her urine culture grew 60,000 E. coli and 50,000 Enterococcus Faecium and she admitted to continued dysuria-patient was kept on standard antibiotics for coverage and transition to oral Keflex and infectious disease Dr. Jerilynn Mages. Candiss Norse did confirm to complete 5 days of therapy with oral Keflex which was performed Patient's meds were resumed in entirety on discharge  We did however hold the patient's darifenacin equivalent because this could cause strangury and urinary symptoms-patient should be followed up in the outpatient setting for the rest of these  issues  Patient will need to go back to the facility on usual home O2 at 2 L   Discharge Exam: Vitals:   08/03/21 0752 08/03/21 1104  BP:  113/65  Pulse: 90 80  Resp: (!) 22 (!) 23  Temp: 98.6 F (37 C) 98.7 F (37.1 C)  SpO2: 96% 97%    Subj on day of d/c   Doing well no distress no pain no fever no chills  General Exam on discharge  Alert coherent no distress EOMI NCAT no focal deficit slightly disheveled S1-S2 no murmur She states that her right arm left arm have restarted paining She has no other issue at this time She is able to straight leg raise She has mild tenderness in the right lower quadrant which is about the same as it has been  Discharge Instructions    Allergies as of 08/03/2021       Reactions   Isosorbide Nitrate Other (See Comments)   Headaches   Sulfamethoxazole-trimethoprim Hives, Rash        Medication List     STOP taking these medications    trospium 20 MG tablet Commonly known as: SANCTURA       TAKE these medications    albuterol 108 (90 Base) MCG/ACT inhaler Commonly known as: VENTOLIN HFA Inhale 1-2 puffs into the lungs every 6 (six) hours as needed for wheezing or shortness of breath.   aspirin EC 81 MG tablet Take 1 tablet (81 mg total) by mouth daily.   atorvastatin 80 MG tablet Commonly known as: LIPITOR Take 80 mg by mouth daily at 6 PM.   budesonide-formoterol 160-4.5 MCG/ACT inhaler Commonly known as: SYMBICORT Inhale 2 puffs into the lungs 2 (two) times daily.   cephALEXin  500 MG capsule Commonly known as: KEFLEX Take 1 capsule (500 mg total) by mouth every 8 (eight) hours for 9 doses.   clopidogrel 75 MG tablet Commonly known as: PLAVIX Take 75 mg by mouth every evening.   conjugated estrogens 0.625 MG/GM vaginal cream Commonly known as: PREMARIN Place 8.54 Applicatorfuls vaginally every Wednesday. Pt only to use 1/4 of the applicator   diclofenac Sodium 1 % Gel Commonly known as: VOLTAREN Apply  2-4 g topically 2 (two) times daily as needed (joint pain).   dolutegravir-lamiVUDine 50-300 MG tablet Commonly known as: DOVATO Take 1 tablet by mouth daily.   furosemide 20 MG tablet Commonly known as: LASIX Take 20 mg by mouth daily as needed for fluid.   lidocaine 5 % Commonly known as: LIDODERM Place 1 patch onto the skin daily.   midodrine 10 MG tablet Commonly known as: PROAMATINE Take 1 tablet (10 mg total) by mouth 2 (two) times daily with a meal.   nitroGLYCERIN 0.4 MG SL tablet Commonly known as: NITROSTAT Place 1 tablet (0.4 mg total) under the tongue every 5 (five) minutes as needed for chest pain.   ondansetron 4 MG tablet Commonly known as: Zofran Take 1 tablet (4 mg total) by mouth every 8 (eight) hours as needed for nausea or vomiting.   pantoprazole 40 MG tablet Commonly known as: PROTONIX Take 40 mg by mouth daily.   polyethylene glycol 17 g packet Commonly known as: MIRALAX / GLYCOLAX Take 17 g by mouth 2 (two) times daily.   ranolazine 500 MG 12 hr tablet Commonly known as: RANEXA Take 500 mg by mouth 2 (two) times daily.   senna-docusate 8.6-50 MG tablet Commonly known as: Senokot-S Take 1 tablet by mouth 2 (two) times daily.   traZODone 50 MG tablet Commonly known as: DESYREL Take 50 mg by mouth at bedtime.       Allergies  Allergen Reactions   Isosorbide Nitrate Other (See Comments)    Headaches   Sulfamethoxazole-Trimethoprim Hives and Rash      The results of significant diagnostics from this hospitalization (including imaging, microbiology, ancillary and laboratory) are listed below for reference.    Significant Diagnostic Studies: CT HEAD WO CONTRAST (5MM)  Result Date: 07/20/2021 CLINICAL DATA:  Dizziness, persistent or recurrent with cardiac or vascular cause suspected. EXAM: CT HEAD WITHOUT CONTRAST TECHNIQUE: Contiguous axial images were obtained from the base of the skull through the vertex without intravenous contrast.  RADIATION DOSE REDUCTION: This exam was performed according to the departmental dose-optimization program which includes automated exposure control, adjustment of the mA and/or kV according to patient size and/or use of iterative reconstruction technique. COMPARISON:  Three days ago FINDINGS: Brain: No evidence of acute infarction, hemorrhage, hydrocephalus, extra-axial collection or mass lesion/mass effect. Patchy low-density in the cerebral white matter attributed to chronic small vessel ischemia. Age normal brain volume Vascular: No hyperdense vessel or unexpected calcification. Skull: Normal. Negative for fracture or focal lesion. Sinuses/Orbits: No acute finding. IMPRESSION: 1. No acute finding. 2. Chronic small vessel ischemia in the hemispheric white matter. Electronically Signed   By: Jorje Guild M.D.   On: 07/20/2021 05:24   CT HEAD WO CONTRAST (5MM)  Result Date: 07/17/2021 CLINICAL DATA:  Trauma. EXAM: CT HEAD WITHOUT CONTRAST CT CERVICAL SPINE WITHOUT CONTRAST TECHNIQUE: Multidetector CT imaging of the head and cervical spine was performed following the standard protocol without intravenous contrast. Multiplanar CT image reconstructions of the cervical spine were also generated. RADIATION DOSE REDUCTION: This exam  was performed according to the departmental dose-optimization program which includes automated exposure control, adjustment of the mA and/or kV according to patient size and/or use of iterative reconstruction technique. COMPARISON:  Head CT dated 09/04/2018. FINDINGS: CT HEAD FINDINGS Brain: The ventricles and sulci appropriate size for patient's age. Mild periventricular and deep white matter chronic microvascular ischemic changes noted. There is no acute intracranial hemorrhage. No mass effect or midline shift no extra-axial fluid collection. Vascular: No hyperdense vessel or unexpected calcification. Skull: Normal. Negative for fracture or focal lesion. Sinuses/Orbits: No acute  finding. Other: None CT CERVICAL SPINE FINDINGS Alignment: No acute subluxation. Skull base and vertebrae: No acute fracture.  Osteopenia. Soft tissues and spinal canal: No prevertebral fluid or swelling. No visible canal hematoma. Disc levels: No acute findings. Degenerative changes primarily at C4-C5 and C5-C6. Upper chest: No acute findings. Other: None IMPRESSION: 1. No acute intracranial pathology. Mild chronic microvascular ischemic changes. 2. No acute cervical spine fracture or subluxation. Electronically Signed   By: Anner Crete M.D.   On: 07/17/2021 00:45   CT CERVICAL SPINE WO CONTRAST  Result Date: 08/01/2021 CLINICAL DATA:  Chronic neck pain. EXAM: CT CERVICAL SPINE WITHOUT CONTRAST TECHNIQUE: Multidetector CT imaging of the cervical spine was performed without intravenous contrast. Multiplanar CT image reconstructions were also generated. RADIATION DOSE REDUCTION: This exam was performed according to the departmental dose-optimization program which includes automated exposure control, adjustment of the mA and/or kV according to patient size and/or use of iterative reconstruction technique. COMPARISON:  None Available. FINDINGS: Alignment: C1 to the superior endplate of T2 is imaged. Normal alignment of the cervical spine. No anterolisthesis or retrolisthesis. The bilateral facets are normally aligned. Skull base and vertebrae: The dens is normally positioned between the lateral masses of C1. Normal atlantodental atlantoaxial articulations. Soft tissues and spinal canal: Prevertebral soft tissues are normal. Disc levels: Mild-to-moderate multilevel cervical spine DDD, worse at C4-C5 and C5-C6 with disc space height loss endplate irregularity and small posteriorly directed disc osteophyte complexes at locations. Upper chest: Limited visualization of the lung apices demonstrates grossly symmetric biapical pleuroparenchymal thickening. Other: Normal noncontrast appearance of the thyroid gland.  IMPRESSION: Mild-to-moderate multilevel cervical spine DDD, worse at C4-C5 and C5-C6. Electronically Signed   By: Sandi Mariscal M.D.   On: 08/01/2021 13:04   CT CERVICAL SPINE WO CONTRAST  Result Date: 07/17/2021 CLINICAL DATA:  Trauma. EXAM: CT HEAD WITHOUT CONTRAST CT CERVICAL SPINE WITHOUT CONTRAST TECHNIQUE: Multidetector CT imaging of the head and cervical spine was performed following the standard protocol without intravenous contrast. Multiplanar CT image reconstructions of the cervical spine were also generated. RADIATION DOSE REDUCTION: This exam was performed according to the departmental dose-optimization program which includes automated exposure control, adjustment of the mA and/or kV according to patient size and/or use of iterative reconstruction technique. COMPARISON:  Head CT dated 09/04/2018. FINDINGS: CT HEAD FINDINGS Brain: The ventricles and sulci appropriate size for patient's age. Mild periventricular and deep white matter chronic microvascular ischemic changes noted. There is no acute intracranial hemorrhage. No mass effect or midline shift no extra-axial fluid collection. Vascular: No hyperdense vessel or unexpected calcification. Skull: Normal. Negative for fracture or focal lesion. Sinuses/Orbits: No acute finding. Other: None CT CERVICAL SPINE FINDINGS Alignment: No acute subluxation. Skull base and vertebrae: No acute fracture.  Osteopenia. Soft tissues and spinal canal: No prevertebral fluid or swelling. No visible canal hematoma. Disc levels: No acute findings. Degenerative changes primarily at C4-C5 and C5-C6. Upper chest: No acute findings. Other:  None IMPRESSION: 1. No acute intracranial pathology. Mild chronic microvascular ischemic changes. 2. No acute cervical spine fracture or subluxation. Electronically Signed   By: Anner Crete M.D.   On: 07/17/2021 00:45   CT ABDOMEN PELVIS W CONTRAST  Result Date: 07/18/2021 CLINICAL DATA:  Abdominal pain, acute, nonlocalized EXAM: CT  ABDOMEN AND PELVIS WITH CONTRAST TECHNIQUE: Multidetector CT imaging of the abdomen and pelvis was performed using the standard protocol following bolus administration of intravenous contrast. RADIATION DOSE REDUCTION: This exam was performed according to the departmental dose-optimization program which includes automated exposure control, adjustment of the mA and/or kV according to patient size and/or use of iterative reconstruction technique. CONTRAST:  171m OMNIPAQUE IOHEXOL 300 MG/ML  SOLN COMPARISON:  06/11/2021 FINDINGS: Lower chest: Mild bibasilar pulmonary fibrotic change again noted. Cardiac size within normal limits. Moderate hiatal hernia. Hepatobiliary: No focal liver abnormality is seen. Status post cholecystectomy. No biliary dilatation. Pancreas: Unremarkable Spleen: Unremarkable Adrenals/Urinary Tract: Adrenal glands are unremarkable. Kidneys are normal, without renal calculi, focal lesion, or hydronephrosis. Bladder contains a small amount of nondependent gas, but is otherwise unremarkable. Stomach/Bowel: There is large volume stool throughout the colon and within the rectal vault without evidence of obstruction. This appears slightly progressive when compared to prior examination. Distal gastric surgery again identified with a surgical staple line noted. The stomach, small bowel, and large bowel are otherwise unremarkable. Appendix absent. Vascular/Lymphatic: Aortic atherosclerosis. No enlarged abdominal or pelvic lymph nodes. Reproductive: Status post hysterectomy. No adnexal masses. Other: Tiny fat containing right inguinal hernia.  No ascites. Musculoskeletal: No acute bone abnormality. No lytic or blastic bone lesion. IMPRESSION: Large volume stool noted throughout the colon without evidence of obstruction. Fecal load appears slightly progressive since prior examination. Moderate hiatal hernia. Postsurgical changes again noted within the distal stomach without evidence of obstruction. Aortic  Atherosclerosis (ICD10-I70.0). Electronically Signed   By: AFidela SalisburyM.D.   On: 07/18/2021 22:27   DG Chest Portable 1 View  Result Date: 07/16/2021 CLINICAL DATA:  Trauma. Multiple falls, on blood thinners. Weakness. EXAM: PORTABLE CHEST 1 VIEW COMPARISON:  Radiograph 06/12/2021, CT 09/16/2020 FINDINGS: Stable heart size and mediastinal contours, borderline cardiomegaly. Coronary stent is visualized. Aortic atherosclerosis. Chronic interstitial coarsening which is stable by radiograph. No pneumothorax or large pleural effusion. No confluent consolidation. No displaced rib fractures. IMPRESSION: No acute findings.  Chronic interstitial lung disease. Electronically Signed   By: MKeith RakeM.D.   On: 07/16/2021 23:40   CT HEAD CODE STROKE WO CONTRAST  Result Date: 08/01/2021 CLINICAL DATA:  Code stroke. Neuro deficit, acute, stroke suspected. Left-sided numbness. EXAM: CT HEAD WITHOUT CONTRAST TECHNIQUE: Contiguous axial images were obtained from the base of the skull through the vertex without intravenous contrast. RADIATION DOSE REDUCTION: This exam was performed according to the departmental dose-optimization program which includes automated exposure control, adjustment of the mA and/or kV according to patient size and/or use of iterative reconstruction technique. COMPARISON:  Head CT 07/20/2021 FINDINGS: Brain: There is no evidence of an acute infarct, intracranial hemorrhage, mass, midline shift, or extra-axial fluid collection. The ventricles and sulci are within normal limits for age. Hypodensities in the cerebral white matter are unchanged and nonspecific but compatible with mild chronic small vessel ischemic disease. Vascular: No hyperdense vessel. Skull: No fracture or suspicious osseous lesion. Sinuses/Orbits: Mild mucosal thickening in the right sphenoid sinus. Clear mastoid air cells. Unremarkable orbits. Other: None. ASPECTS ([facility] Score) - Ganglionic level  infarction (caudate, lentiform nuclei, internal  capsule, insula, M1-M3 cortex): 7 - Supraganglionic infarction (M4-M6 cortex): 3 Total score (0-10 with 10 being normal): 10 IMPRESSION: 1. No evidence of acute intracranial abnormality. ASPECTS of 10. 2. Mild chronic small vessel ischemic disease. These results were communicated to Dr. Theda Sers at 12:54 pm on 08/01/2021 by text page via the [facility] messaging system. Electronically Signed   By: Logan Bores M.D.   On: 08/01/2021 12:56    Microbiology: Recent Results (from the past 240 hour(s))  Resp Panel by RT-PCR (Flu A&B, Covid) Nasopharyngeal Swab     Status: None   Collection Time: 08/01/21 12:30 PM   Specimen: Nasopharyngeal Swab; Nasopharyngeal(NP) swabs in vial transport medium  Result Value Ref Range Status   SARS Coronavirus 2 by RT PCR NEGATIVE NEGATIVE Final    Comment: (NOTE) SARS-CoV-2 target nucleic acids are NOT DETECTED.  The SARS-CoV-2 RNA is generally detectable in upper respiratory specimens during the acute phase of infection. The lowest concentration of SARS-CoV-2 viral copies this assay can detect is 138 copies/mL. A negative result does not preclude SARS-Cov-2 infection and should not be used as the sole basis for treatment or other patient management decisions. A negative result may occur with  improper specimen collection/handling, submission of specimen other than nasopharyngeal swab, presence of viral mutation(s) within the areas targeted by this assay, and inadequate number of viral copies(<138 copies/mL). A negative result must be combined with clinical observations, patient history, and epidemiological information. The expected result is Negative.  Fact Sheet for Patients:  EntrepreneurPulse.com.au  Fact Sheet for Healthcare Providers:  IncredibleEmployment.be  This test is no t yet approved or cleared by the [place] FDA and  has been authorized for detection and/or  diagnosis of SARS-CoV-2 by FDA under an Emergency Use Authorization (EUA). This EUA will remain  in effect (meaning this test can be used) for the duration of the COVID-19 declaration under Section 564(b)(1) of the Act, 21 U.S.C.section 360bbb-3(b)(1), unless the authorization is terminated  or revoked sooner.       Influenza A by PCR NEGATIVE NEGATIVE Final   Influenza B by PCR NEGATIVE NEGATIVE Final    Comment: (NOTE) The Xpert Xpress SARS-CoV-2/FLU/RSV plus assay is intended as an aid in the diagnosis of influenza from Nasopharyngeal swab specimens and should not be used as a sole basis for treatment. Nasal washings and aspirates are unacceptable for Xpert Xpress SARS-CoV-2/FLU/RSV testing.  Fact Sheet for Patients: EntrepreneurPulse.com.au  Fact Sheet for Healthcare Providers: IncredibleEmployment.be  This test is not yet approved or cleared by the [place] FDA and has been authorized for detection and/or diagnosis of SARS-CoV-2 by FDA under an Emergency Use Authorization (EUA). This EUA will remain in effect (meaning this test can be used) for the duration of the COVID-19 declaration under Section 564(b)(1) of the Act, 21 U.S.C. section 360bbb-3(b)(1), unless the authorization is terminated or revoked.  Performed at [facility], [place] [address], [place], [place] [address]   Urine Culture     Status: Abnormal (Preliminary result)   Collection Time: 08/01/21  1:31 PM   Specimen: Urine, Clean Catch  Result Value Ref Range Status   Specimen Description URINE, CLEAN CATCH  Final   Special Requests NONE  Final   Culture (A)  Final    60,000 COLONIES/mL ESCHERICHIA COLI 50,000 COLONIES/mL ENTEROCOCCUS FAECIUM SUSCEPTIBILITIES TO FOLLOW Performed at [facility], [place] [address], [place], [place] [address]    Report Status PENDING  Incomplete   Organism ID, Bacteria  ESCHERICHIA COLI (A)  Final      Susceptibility    Escherichia coli - MIC*    AMPICILLIN <=2 SENSITIVE Sensitive     CEFAZOLIN <=4 SENSITIVE Sensitive     CEFEPIME <=0.12 SENSITIVE Sensitive     CEFTRIAXONE <=0.25 SENSITIVE Sensitive     CIPROFLOXACIN <=0.25 SENSITIVE Sensitive     GENTAMICIN <=1 SENSITIVE Sensitive     IMIPENEM <=0.25 SENSITIVE Sensitive     NITROFURANTOIN <=16 SENSITIVE Sensitive     TRIMETH/SULFA <=20 SENSITIVE Sensitive     AMPICILLIN/SULBACTAM <=2 SENSITIVE Sensitive     PIP/TAZO <=4 SENSITIVE Sensitive     * 60,000 COLONIES/mL ESCHERICHIA COLI     Labs: Basic Metabolic Panel: Recent Labs  Lab 08/01/21 1242 08/01/21 1329 08/03/21 0402  NA 132* 139 140  K 3.9 4.2 3.8  CL 98 105 111  CO2 21*  --  21*  GLUCOSE 117* 116* 98  BUN '9 14 12  '$ CREATININE 0.68 0.50 0.56  CALCIUM 9.7  --  8.5*   Liver Function Tests: Recent Labs  Lab 08/01/21 1242  AST 43*  ALT 24  ALKPHOS 47  BILITOT 0.9  PROT 7.1  ALBUMIN 2.5*   No results for input(s): LIPASE, AMYLASE in the last 168 hours. No results for input(s): AMMONIA in the last 168 hours. CBC: Recent Labs  Lab 08/01/21 1242 08/01/21 1329 08/03/21 0402  WBC 10.1  --  7.7  NEUTROABS 7.3  --   --   HGB 13.9 14.3 11.4*  HCT 41.8 42.0 35.2*  MCV 101.7*  --  101.4*  PLT 298  --  203   Cardiac Enzymes: Recent Labs  Lab 08/02/21 0420  CKTOTAL 109   BNP: BNP (last 3 results) No results for input(s): BNP in the last 8760 hours.  ProBNP (last 3 results) No results for input(s): PROBNP in the last 8760 hours.  CBG: No results for input(s): GLUCAP in the last 168 hours.     Signed:  Nita Sells MD   [facility] 08/03/2021, 12:31 PM

## 2021-08-03 NOTE — TOC Transition Note (Signed)
Transition of Care [facility]) - CM/SW Discharge Note   Patient Details  Name: [name] [name] MRN: [id number] Date of Birth: 10-31-1950  [facility] CM/SW Contact:  Geralynn Ochs, LCSW Phone Number: 08/03/2021, 1:25 PM   Clinical Narrative:   CSW received update that insurance has been approved on patient, and bed is available at [facility]. CSW sent discharge information to [facility] and scheduled transport with [facility] for next available.  Nurse to call report to [phone], Room 3238.    Final next level of care: Skilled Nursing Facility Barriers to Discharge: Barriers Resolved   Patient Goals and CMS Choice   CMS Medicare.gov Compare Post Acute Care list provided to:: Patient Choice offered to / list presented to : Patient  Discharge Placement              Patient chooses bed at: [facility] Patient to be transferred to facility by: [place] Name of family member notified: Self Patient and family notified of of transfer: 08/03/21  Discharge Plan and Services In-house Referral: Clinical Social Work Discharge Planning Services: CM Consult Post Acute Care Choice: [place]                               Social Determinants of Health (SDOH) Interventions     Readmission Risk Interventions    07/20/2021    2:13 PM  Readmission Risk Prevention Plan  Transportation Screening Complete  Home Care Screening Complete

## 2021-08-03 NOTE — Plan of Care (Signed)

## 2021-08-04 LAB — URINE CULTURE: Culture: 60000 — AB

## 2021-08-09 ENCOUNTER — Encounter: Payer: Self-pay | Admitting: Family Medicine

## 2021-08-09 ENCOUNTER — Non-Acute Institutional Stay: Payer: Medicare Other | Admitting: Family Medicine

## 2021-08-09 VITALS — BP 122/66 | HR 78 | Resp 20

## 2021-08-09 DIAGNOSIS — B20 Human immunodeficiency virus [HIV] disease: Secondary | ICD-10-CM

## 2021-08-09 DIAGNOSIS — R112 Nausea with vomiting, unspecified: Secondary | ICD-10-CM

## 2021-08-09 DIAGNOSIS — A491 Streptococcal infection, unspecified site: Secondary | ICD-10-CM | POA: Insufficient documentation

## 2021-08-09 DIAGNOSIS — R262 Difficulty in walking, not elsewhere classified: Secondary | ICD-10-CM

## 2021-08-09 NOTE — Progress Notes (Signed)
[facility] Palliative Care Consult Note Telephone: [phone]  Fax: [phone]    Date of encounter: 08/09/21 11:24 AM PATIENT NAME: [name] [name] [address] [place] [address]   [phone] (home)  DOB: Aug 05, 1950 MRN: [id number] PRIMARY CARE PROVIDER:    [place],  [place] [address] [phone]  REFERRING PROVIDER:   [place], [place],  [place] [address] [phone]  RESPONSIBLE PARTY:    Contact Information     Name Relation Home Work [place] Daughter [phone]  [phone]        I met face to face with patient in her skilled nursing facility. Palliative Care was asked to follow this patient by consultation request of  [place]* to address advance care planning and complex medical decision making. This is a follow up visit.                                   ASSESSMENT, SYMPTOM MANAGEMENT AND PLAN / RECOMMENDATIONS:   VRE UTI/HIV positive Remains symptomatic after completion of Keflex for E coli UTI, secondary organism was Enterococcus faecium resistant to Ampicillin and Nitrofurantoin as well. Noted sensitivity to Linezolid.   On Dovato HAART.  No noted interaction between Dovato and Linezolid. Sent message to consult with Infectious Disease-Dr Netta Cedars via Epic to ensure appropriate antibiotic therapy, length of therapy and any alternatives.  2.   Nausea and vomiting May partially be secondary to UTI/possible pyelonephritis Continue Zofran ODT prn  3.  Impaired ambulation Likely secondary to UTI,  recent sepsis and decreased mobility. Treat infection and recommend continued PT   Advance Care Planning/Goals of Care: Goals include to maximize quality of life and symptom management:  Identification of a healthcare agent-daughter [name] [name] Review of an advance directive  document-MOST on file with SNF . Decision not to resuscitate or to de-escalate disease focused treatments due to poor prognosis. CODE STATUS: MOST as of 07/20/21: DNAR Full scope of medical intervention for breathing Antibiotics and IV fluids as indicated Does not address feeding tubes at all    Follow up Palliative Care Visit: Palliative care will continue to follow for complex medical decision making, advance care planning, and clarification of goals. Return 4 weeks or prn.    This visit was coded based on medical decision making (MDM).  PPS: 50%  HOSPICE ELIGIBILITY/DIAGNOSIS: TBD  Chief Complaint:  Pt with recent hospitalization for urosepsis with inability to get out of the car into her home following d/c and had to return to SNF.  HISTORY OF PRESENT ILLNESS:  [name] [name] is a 71 y.o. year old female with recent admission for urosepsis with E coli and E faecium. She has chronic hypoxic respiratory failure with idiopathic pulmonary fibrosis.  She also has allergic rhinitis, PUD, GERD, nausea/vomiting, goiter, low back pain, recurrent UTI, constipation, macrocytic anemia with Vitamin B12 deficiency and HLD.  She is also HIV positive on suppressive therapy and has not discussed this with her daughter so she doesn't want that information shared.  with Pt c/o increased SOB, DOE, was unable to walk when she got home from the hospital and was brought back to rehab.  Denies falls.  Had some nausea early this am with vomiting, was having pain prior and since in suprapubic region of low abdomen.  She states some small improvement in nausea with Zofran but she has no pain meds. States having regular bowel movements.  She indicates that Pulmonology has said that they have done all for her they could do and she would need to have a lung transplant. She currently has increased SOB and DOE with orthopnea, no PND. She states she needs 2 caregivers to do a transplant but her daughter works and there is   no one else to help.  She is working with PT and able to ambulate small distances with PT. CBC as of 08/03/21:  WBC  7.7, HGB 11.4 stable, HCT 35.2%.  AST elevated 08/01/21 at 43 but trending down.  CMP 08/01/21 low Na 132, CO2 21, and Albumin 2.5, normal renal function. Urine culture positive for 60,000 colonies E coli and 50,000 E faecium with VRE isolated.  VRE enterococcus faecium resistant to Ampicillin Nitrofurantoin and Vancomycin, sensitive to Linezolid. Discharge note from hospitalization states pt was kept on "standard antibiotics" for coverage and transitioned to oral Keflex for 5 days after confirmation with ID.  VRE status and sensitivities of Enterococcus faecium were not known at time of d/c.  History obtained from review of EMR, discussion with facility staff and/or [name] [name].  I reviewed available labs, medications, imaging, studies and related documents from the EMR.  Records reviewed and summarized above.   ROS  General: NAD EYES: denies vision changes ENMT: denies dysphagia Cardiovascular: denies chest pain, endorses worsening DOE, has orthopnea, no PND Pulmonary: denies cough, endorses increased SOB Abdomen: endorses fair appetite, denies constipation, endorses continence of bowel GU: denies dysuria, endorses continence of urine MSK:  endorses increased weakness, no falls reported Skin: denies rashes or wounds Neurological: endorses suprapubic pain, denies insomnia Psych: Endorses positive mood Heme/lymph/immuno: denies bruises, abnormal bleeding  Physical Exam: Current and past weights: Constitutional: NAD General: frail appearing EYES: anicteric sclera, lids intact, no discharge  ENMT: intact hearing, oral mucous membranes moist, dentition intact CV: S1S2, RRR, no LE edema Pulmonary: CTAB, diminished in bases, no increased work of breathing, no cough, on 2L [place] Abdomen: normo-active BS + 4 quadrants, soft and non tender, no ascites GU: deferred MSK: no sarcopenia,  moves all extremities, minimally ambulatory Skin: warm and dry, no rashes or wounds on visible skin Neuro:  no generalized weakness,  no cognitive impairment Psych: non-anxious affect, A and O x 3 Hem/lymph/immuno:  scattered ecchymoses of varying sizes and stages of healing of RUE   Thank you for the opportunity to participate in the care of [name] [name].  The palliative care team will continue to follow. Please call our office at [phone] if we can be of additional assistance.   Marijo Conception, FNP   COVID-19 PATIENT SCREENING TOOL Asked and negative response unless otherwise noted:   Have you had symptoms of covid, tested positive or been in contact with someone with symptoms/positive test in the past 5-10 days?  Unknown

## 2021-08-10 ENCOUNTER — Telehealth: Payer: Self-pay | Admitting: Family Medicine

## 2021-08-10 DIAGNOSIS — Z1624 Resistance to multiple antibiotics: Secondary | ICD-10-CM

## 2021-08-10 NOTE — Telephone Encounter (Signed)
TCT Dr Francee Nodal, infectious disease physician consulted when pt was in ED.  Advised that pt had a culture come back after d/c, + for VRE and is symptomatic with dysuria and nausea/vomiting but has completed Keflex for E coli.  She states that they believed the VRE Enterococcus faecium to be a contaminant as pt had 6-10 squamos cells per HPF, was not symptomatic at d/c althought VRE had not been treated.  She advised obtaining labs and more midstream collection, not treating currently.  She states pt's urine always grows out E coli.  Damaris Hippo FNP-C

## 2021-09-08 ENCOUNTER — Emergency Department (HOSPITAL_COMMUNITY): Payer: Medicare Other

## 2021-09-08 ENCOUNTER — Inpatient Hospital Stay (HOSPITAL_COMMUNITY)
Admission: EM | Admit: 2021-09-08 | Discharge: 2021-09-11 | DRG: 871 | Disposition: A | Discharge status: Home Health | Payer: Medicare Other | Attending: Internal Medicine | Admitting: Internal Medicine

## 2021-09-08 ENCOUNTER — Inpatient Hospital Stay: Payer: Self-pay

## 2021-09-08 DIAGNOSIS — A419 Sepsis, unspecified organism: Principal | ICD-10-CM

## 2021-09-08 DIAGNOSIS — E872 Acidosis, unspecified: Secondary | ICD-10-CM

## 2021-09-08 DIAGNOSIS — J189 Pneumonia, unspecified organism: Secondary | ICD-10-CM

## 2021-09-08 DIAGNOSIS — E876 Hypokalemia: Secondary | ICD-10-CM

## 2021-09-08 DIAGNOSIS — Z21 Asymptomatic human immunodeficiency virus [HIV] infection status: Secondary | ICD-10-CM | POA: Diagnosis present

## 2021-09-08 DIAGNOSIS — S2242XA Multiple fractures of ribs, left side, initial encounter for closed fracture: Secondary | ICD-10-CM | POA: Diagnosis present

## 2021-09-08 DIAGNOSIS — Z7902 Long term (current) use of antithrombotics/antiplatelets: Secondary | ICD-10-CM | POA: Diagnosis not present

## 2021-09-08 DIAGNOSIS — Z8249 Family history of ischemic heart disease and other diseases of the circulatory system: Secondary | ICD-10-CM

## 2021-09-08 DIAGNOSIS — R6520 Severe sepsis without septic shock: Secondary | ICD-10-CM | POA: Diagnosis present

## 2021-09-08 DIAGNOSIS — Z79899 Other long term (current) drug therapy: Secondary | ICD-10-CM | POA: Diagnosis not present

## 2021-09-08 DIAGNOSIS — Y92008 Other place in unspecified non-institutional (private) residence as the place of occurrence of the external cause: Secondary | ICD-10-CM | POA: Diagnosis not present

## 2021-09-08 DIAGNOSIS — Z20822 Contact with and (suspected) exposure to covid-19: Secondary | ICD-10-CM | POA: Diagnosis present

## 2021-09-08 DIAGNOSIS — R296 Repeated falls: Secondary | ICD-10-CM | POA: Diagnosis present

## 2021-09-08 DIAGNOSIS — Z833 Family history of diabetes mellitus: Secondary | ICD-10-CM

## 2021-09-08 DIAGNOSIS — B9620 Unspecified Escherichia coli [E. coli] as the cause of diseases classified elsewhere: Secondary | ICD-10-CM | POA: Diagnosis present

## 2021-09-08 DIAGNOSIS — J8410 Pulmonary fibrosis, unspecified: Secondary | ICD-10-CM | POA: Diagnosis present

## 2021-09-08 DIAGNOSIS — I9589 Other hypotension: Secondary | ICD-10-CM | POA: Diagnosis present

## 2021-09-08 DIAGNOSIS — Z7951 Long term (current) use of inhaled steroids: Secondary | ICD-10-CM

## 2021-09-08 DIAGNOSIS — I2510 Atherosclerotic heart disease of native coronary artery without angina pectoris: Secondary | ICD-10-CM | POA: Diagnosis present

## 2021-09-08 DIAGNOSIS — W1830XA Fall on same level, unspecified, initial encounter: Secondary | ICD-10-CM | POA: Diagnosis present

## 2021-09-08 LAB — MRSA NEXT GEN BY PCR, NASAL: MRSA by PCR Next Gen: NOT DETECTED

## 2021-09-08 LAB — COMPREHENSIVE METABOLIC PANEL
ALT: 16 U/L (ref 0–44)
AST: 29 U/L (ref 15–41)
Albumin: 2.6 g/dL — ABNORMAL LOW (ref 3.5–5.0)
Alkaline Phosphatase: 58 U/L (ref 38–126)
Anion gap: 17 — ABNORMAL HIGH (ref 5–15)
BUN: 12 mg/dL (ref 8–23)
CO2: 16 mmol/L — ABNORMAL LOW (ref 22–32)
Calcium: 8.8 mg/dL — ABNORMAL LOW (ref 8.9–10.3)
Chloride: 108 mmol/L (ref 98–111)
Creatinine, Ser: 0.87 mg/dL (ref 0.44–1.00)
GFR, Estimated: >60 mL/min (ref >60)
Glucose, Bld: 180 mg/dL — ABNORMAL HIGH (ref 70–99)
Potassium: 3.3 mmol/L — ABNORMAL LOW (ref 3.5–5.1)
Sodium: 141 mmol/L (ref 135–145)
Total Bilirubin: 0.7 mg/dL (ref 0.3–1.2)
Total Protein: 5.3 g/dL — ABNORMAL LOW (ref 6.5–8.1)

## 2021-09-08 LAB — I-STAT VENOUS BLOOD GAS, ED
Acid-base deficit: 7 mmol/L — ABNORMAL HIGH (ref 0.0–2.0)
Bicarbonate: 15.4 mmol/L — ABNORMAL LOW (ref 20.0–28.0)
Calcium, Ion: 1.06 mmol/L — ABNORMAL LOW (ref 1.15–1.40)
HCT: 36 % (ref 36.0–46.0)
Hemoglobin: 12.2 g/dL (ref 12.0–15.0)
O2 Saturation: 85 %
Potassium: 3.3 mmol/L — ABNORMAL LOW (ref 3.5–5.1)
Sodium: 140 mmol/L (ref 135–145)
TCO2: 16 mmol/L — ABNORMAL LOW (ref 22–32)
pCO2, Ven: 22.4 mmHg — ABNORMAL LOW (ref 44–60)
pH, Ven: 7.445 — ABNORMAL HIGH (ref 7.25–7.43)
pO2, Ven: 46 mmHg — ABNORMAL HIGH (ref 32–45)

## 2021-09-08 LAB — CBC WITH DIFFERENTIAL/PLATELET
Abs Immature Granulocytes: 0.07 10*3/uL (ref 0.00–0.07)
Basophils Absolute: 0 10*3/uL (ref 0.0–0.1)
Basophils Relative: 0 %
Eosinophils Absolute: 0 10*3/uL (ref 0.0–0.5)
Eosinophils Relative: 0 %
HCT: 39.3 % (ref 36.0–46.0)
Hemoglobin: 12.7 g/dL (ref 12.0–15.0)
Immature Granulocytes: 1 %
Lymphocytes Relative: 7 %
Lymphs Abs: 0.7 10*3/uL (ref 0.7–4.0)
MCH: 34.4 pg — ABNORMAL HIGH (ref 26.0–34.0)
MCHC: 32.3 g/dL (ref 30.0–36.0)
MCV: 106.5 fL — ABNORMAL HIGH (ref 80.0–100.0)
Monocytes Absolute: 1.3 10*3/uL — ABNORMAL HIGH (ref 0.1–1.0)
Monocytes Relative: 12 %
Neutro Abs: 8.9 10*3/uL — ABNORMAL HIGH (ref 1.7–7.7)
Neutrophils Relative %: 80 %
Platelets: 206 10*3/uL (ref 150–400)
RBC: 3.69 MIL/uL — ABNORMAL LOW (ref 3.87–5.11)
RDW: 15.1 % (ref 11.5–15.5)
WBC: 11 10*3/uL — ABNORMAL HIGH (ref 4.0–10.5)
nRBC: 0 % (ref 0.0–0.2)

## 2021-09-08 LAB — PROTIME-INR
INR: 1.1 (ref 0.8–1.2)
Prothrombin Time: 14.1 seconds (ref 11.4–15.2)

## 2021-09-08 LAB — CBC
HCT: 34 % — ABNORMAL LOW (ref 36.0–46.0)
Hemoglobin: 11.2 g/dL — ABNORMAL LOW (ref 12.0–15.0)
MCH: 34 pg (ref 26.0–34.0)
MCHC: 32.9 g/dL (ref 30.0–36.0)
MCV: 103.3 fL — ABNORMAL HIGH (ref 80.0–100.0)
Platelets: 193 10*3/uL (ref 150–400)
RBC: 3.29 MIL/uL — ABNORMAL LOW (ref 3.87–5.11)
RDW: 15 % (ref 11.5–15.5)
WBC: 10.5 10*3/uL (ref 4.0–10.5)
nRBC: 0 % (ref 0.0–0.2)

## 2021-09-08 LAB — APTT: aPTT: 23 seconds — ABNORMAL LOW (ref 24–36)

## 2021-09-08 LAB — URINALYSIS, ROUTINE W REFLEX MICROSCOPIC
Bilirubin Urine: NEGATIVE
Glucose, UA: NEGATIVE mg/dL
Hgb urine dipstick: NEGATIVE
Ketones, ur: NEGATIVE mg/dL
Leukocytes,Ua: NEGATIVE
Nitrite: NEGATIVE
Protein, ur: NEGATIVE mg/dL
Specific Gravity, Urine: 1.018 (ref 1.005–1.030)
pH: 5 (ref 5.0–8.0)

## 2021-09-08 LAB — MAGNESIUM: Magnesium: 1.8 mg/dL (ref 1.7–2.4)

## 2021-09-08 LAB — I-STAT BETA HCG BLOOD, ED (MC, WL, AP ONLY): I-stat hCG, quantitative: <5 m[IU]/mL (ref <5)

## 2021-09-08 LAB — LACTIC ACID, PLASMA
Lactic Acid, Venous: 7.8 mmol/L (ref 0.5–1.9)
Lactic Acid, Venous: 3 mmol/L (ref 0.5–1.9)
Lactic Acid, Venous: 2.4 mmol/L (ref 0.5–1.9)
Lactic Acid, Venous: 1.8 mmol/L (ref 0.5–1.9)

## 2021-09-08 LAB — CREATININE, SERUM
Creatinine, Ser: 0.61 mg/dL (ref 0.44–1.00)
GFR, Estimated: >60 mL/min (ref >60)

## 2021-09-08 LAB — RESP PANEL BY RT-PCR (FLU A&B, COVID) ARPGX2
Influenza A by PCR: NEGATIVE
Influenza B by PCR: NEGATIVE
SARS Coronavirus 2 by RT PCR: NEGATIVE

## 2021-09-08 LAB — SALICYLATE LEVEL: Salicylate Lvl: <7 mg/dL — ABNORMAL LOW (ref 7.0–30.0)

## 2021-09-08 MED ORDER — MIDODRINE HCL 5 MG PO TABS
10.0000 mg | ORAL_TABLET | Freq: Two times a day (BID) | ORAL | Status: DC
  Administered 2021-09-08 – 2021-09-11 (×7): 10 mg via ORAL
  Filled 2021-09-08 (×7): qty 2

## 2021-09-08 MED ORDER — SODIUM CHLORIDE 0.9 % IV SOLN: INTRAVENOUS | Status: DC

## 2021-09-08 MED ORDER — ONDANSETRON HCL 4 MG/2ML IJ SOLN
4.0000 mg | Freq: Four times a day (QID) | INTRAMUSCULAR | Status: DC | PRN
Start: 2021-09-08 — End: 2021-09-11
  Administered 2021-09-08 – 2021-09-10 (×6): 4 mg via INTRAVENOUS
  Filled 2021-09-08 (×6): qty 2

## 2021-09-08 MED ORDER — METRONIDAZOLE 500 MG/100ML IV SOLN
500.0000 mg | Freq: Once | INTRAVENOUS | Status: AC
  Administered 2021-09-08: 500 mg via INTRAVENOUS
  Filled 2021-09-08: qty 100

## 2021-09-08 MED ORDER — IPRATROPIUM-ALBUTEROL 0.5-2.5 (3) MG/3ML IN SOLN: 3.0000 mL | RESPIRATORY_TRACT | Status: DC | PRN

## 2021-09-08 MED ORDER — OXYCODONE HCL 5 MG PO TABS
5.0000 mg | ORAL_TABLET | ORAL | Status: DC | PRN
  Administered 2021-09-08: 5 mg via ORAL
  Filled 2021-09-08: qty 1

## 2021-09-08 MED ORDER — FLUTICASONE FUROATE-VILANTEROL 100-25 MCG/ACT IN AEPB
1.0000 | INHALATION_SPRAY | Freq: Every day | RESPIRATORY_TRACT | Status: DC
  Administered 2021-09-09 – 2021-09-11 (×3): 1 via RESPIRATORY_TRACT
  Filled 2021-09-08: qty 28

## 2021-09-08 MED ORDER — NITROGLYCERIN 0.4 MG SL SUBL: 0.4000 mg | SUBLINGUAL_TABLET | SUBLINGUAL | Status: DC | PRN

## 2021-09-08 MED ORDER — LACTATED RINGERS IV BOLUS (SEPSIS): 1000.0000 mL | Freq: Once | INTRAVENOUS | Status: DC

## 2021-09-08 MED ORDER — POTASSIUM CHLORIDE CRYS ER 20 MEQ PO TBCR
60.0000 meq | EXTENDED_RELEASE_TABLET | Freq: Once | ORAL | Status: DC
Start: 2021-09-08 — End: 2021-09-09
  Filled 2021-09-08: qty 3

## 2021-09-08 MED ORDER — CHLORHEXIDINE GLUCONATE CLOTH 2 % EX PADS
6.0000 | MEDICATED_PAD | Freq: Every day | CUTANEOUS | Status: DC
  Administered 2021-09-08 – 2021-09-11 (×4): 6 via TOPICAL

## 2021-09-08 MED ORDER — TRAZODONE HCL 50 MG PO TABS
50.0000 mg | ORAL_TABLET | Freq: Every day | ORAL | Status: DC
  Administered 2021-09-08 – 2021-09-10 (×3): 50 mg via ORAL
  Filled 2021-09-08 (×3): qty 1

## 2021-09-08 MED ORDER — VANCOMYCIN HCL IN DEXTROSE 1-5 GM/200ML-% IV SOLN
1000.0000 mg | Freq: Once | INTRAVENOUS | Status: AC
  Administered 2021-09-08: 1000 mg via INTRAVENOUS
  Filled 2021-09-08: qty 200

## 2021-09-08 MED ORDER — ACETAMINOPHEN 325 MG PO TABS
650.0000 mg | ORAL_TABLET | Freq: Four times a day (QID) | ORAL | Status: DC
  Administered 2021-09-08 – 2021-09-11 (×10): 650 mg via ORAL
  Filled 2021-09-08 (×11): qty 2

## 2021-09-08 MED ORDER — SODIUM CHLORIDE 0.9% FLUSH: 10.0000 mL | INTRAVENOUS | Status: DC | PRN

## 2021-09-08 MED ORDER — ENOXAPARIN SODIUM 40 MG/0.4ML IJ SOSY
40.0000 mg | PREFILLED_SYRINGE | INTRAMUSCULAR | Status: DC
  Administered 2021-09-08 – 2021-09-11 (×4): 40 mg via SUBCUTANEOUS
  Filled 2021-09-08 (×4): qty 0.4

## 2021-09-08 MED ORDER — LACTATED RINGERS IV SOLN: INTRAVENOUS | Status: DC

## 2021-09-08 MED ORDER — SODIUM CHLORIDE 0.9 % IV SOLN: 2.0000 g | Freq: Two times a day (BID) | INTRAVENOUS | Status: DC

## 2021-09-08 MED ORDER — SODIUM CHLORIDE 0.9% FLUSH
10.0000 mL | Freq: Two times a day (BID) | INTRAVENOUS | Status: DC
  Administered 2021-09-08 – 2021-09-11 (×6): 10 mL

## 2021-09-08 MED ORDER — ACETAMINOPHEN 325 MG PO TABS: 650.0000 mg | ORAL_TABLET | Freq: Four times a day (QID) | ORAL | Status: DC | PRN

## 2021-09-08 MED ORDER — SODIUM CHLORIDE 0.9 % IV BOLUS
1000.0000 mL | Freq: Once | INTRAVENOUS | Status: AC
  Administered 2021-09-08: 1000 mL via INTRAVENOUS

## 2021-09-08 MED ORDER — HYDROMORPHONE HCL 1 MG/ML IJ SOLN
0.5000 mg | INTRAMUSCULAR | Status: DC | PRN
  Administered 2021-09-08 (×2): 0.5 mg via INTRAVENOUS
  Administered 2021-09-09: 1 mg via INTRAVENOUS
  Filled 2021-09-08 (×2): qty 1
  Filled 2021-09-08: qty 0.5

## 2021-09-08 MED ORDER — SODIUM CHLORIDE 0.9 % IV SOLN
2.0000 g | INTRAVENOUS | Status: DC
  Administered 2021-09-08 – 2021-09-11 (×4): 2 g via INTRAVENOUS
  Filled 2021-09-08 (×5): qty 20

## 2021-09-08 MED ORDER — METHOCARBAMOL 500 MG PO TABS
500.0000 mg | ORAL_TABLET | Freq: Four times a day (QID) | ORAL | Status: DC | PRN
  Administered 2021-09-09 – 2021-09-10 (×4): 500 mg via ORAL
  Filled 2021-09-08 (×4): qty 1

## 2021-09-08 MED ORDER — RANOLAZINE ER 500 MG PO TB12
500.0000 mg | ORAL_TABLET | Freq: Two times a day (BID) | ORAL | Status: DC
  Administered 2021-09-08 – 2021-09-11 (×6): 500 mg via ORAL
  Filled 2021-09-08 (×8): qty 1

## 2021-09-08 MED ORDER — SODIUM CHLORIDE 0.9 % IV SOLN
2.0000 g | Freq: Once | INTRAVENOUS | Status: AC
  Administered 2021-09-08: 2 g via INTRAVENOUS
  Filled 2021-09-08: qty 12.5

## 2021-09-08 MED ORDER — GABAPENTIN 300 MG PO CAPS
300.0000 mg | ORAL_CAPSULE | Freq: Every day | ORAL | Status: DC
  Administered 2021-09-08 – 2021-09-10 (×3): 300 mg via ORAL
  Filled 2021-09-08 (×3): qty 1

## 2021-09-08 MED ORDER — VANCOMYCIN HCL 750 MG/150ML IV SOLN: 750.0000 mg | INTRAVENOUS | Status: DC

## 2021-09-08 MED ORDER — OXYCODONE HCL 5 MG PO TABS
5.0000 mg | ORAL_TABLET | ORAL | Status: DC | PRN
  Administered 2021-09-08 – 2021-09-10 (×8): 5 mg via ORAL
  Filled 2021-09-08 (×8): qty 1

## 2021-09-08 MED ORDER — PANTOPRAZOLE SODIUM 40 MG PO TBEC
40.0000 mg | DELAYED_RELEASE_TABLET | Freq: Every day | ORAL | Status: DC
  Administered 2021-09-08 – 2021-09-11 (×4): 40 mg via ORAL
  Filled 2021-09-08 (×4): qty 1

## 2021-09-08 MED ORDER — DARIFENACIN HYDROBROMIDE ER 7.5 MG PO TB24
7.5000 mg | ORAL_TABLET | Freq: Every day | ORAL | Status: DC
  Administered 2021-09-08 – 2021-09-11 (×4): 7.5 mg via ORAL
  Filled 2021-09-08 (×4): qty 1

## 2021-09-08 MED ORDER — SODIUM CHLORIDE 0.9 % IV SOLN
500.0000 mg | INTRAVENOUS | Status: DC
  Administered 2021-09-08 – 2021-09-09 (×2): 500 mg via INTRAVENOUS
  Filled 2021-09-08 (×2): qty 5

## 2021-09-08 MED ORDER — CLOPIDOGREL BISULFATE 75 MG PO TABS
75.0000 mg | ORAL_TABLET | Freq: Every day | ORAL | Status: DC
  Administered 2021-09-08 – 2021-09-11 (×4): 75 mg via ORAL
  Filled 2021-09-08 (×4): qty 1

## 2021-09-08 MED ORDER — DOLUTEGRAVIR-LAMIVUDINE 50-300 MG PO TABS
1.0000 | ORAL_TABLET | Freq: Every day | ORAL | Status: DC
  Administered 2021-09-08 – 2021-09-11 (×4): 1 via ORAL
  Filled 2021-09-08 (×4): qty 1

## 2021-09-08 MED ORDER — DOCUSATE SODIUM 100 MG PO CAPS
100.0000 mg | ORAL_CAPSULE | Freq: Two times a day (BID) | ORAL | Status: DC
  Administered 2021-09-08 – 2021-09-11 (×7): 100 mg via ORAL
  Filled 2021-09-08 (×7): qty 1

## 2021-09-08 MED ORDER — FENTANYL CITRATE PF 50 MCG/ML IJ SOSY
25.0000 ug | PREFILLED_SYRINGE | INTRAMUSCULAR | Status: AC | PRN
  Administered 2021-09-08: 25 ug via INTRAVENOUS
  Filled 2021-09-08: qty 1

## 2021-09-08 MED ORDER — MAGNESIUM SULFATE 2 GM/50ML IV SOLN
2.0000 g | Freq: Once | INTRAVENOUS | Status: AC
Start: 2021-09-08 — End: 2021-09-08
  Administered 2021-09-08: 2 g via INTRAVENOUS
  Filled 2021-09-08: qty 50

## 2021-09-08 MED ORDER — ATORVASTATIN CALCIUM 80 MG PO TABS
80.0000 mg | ORAL_TABLET | Freq: Every evening | ORAL | Status: DC
  Administered 2021-09-08 – 2021-09-10 (×3): 80 mg via ORAL
  Filled 2021-09-08 (×3): qty 1

## 2021-09-08 MED ORDER — ACETAMINOPHEN 650 MG RE SUPP: 650.0000 mg | Freq: Four times a day (QID) | RECTAL | Status: DC | PRN

## 2021-09-08 NOTE — Progress Notes (Signed)
PT Cancellation Note  Patient Details Name: Haille Pardi MRN: [id number] DOB: 1950-11-03   Cancelled Treatment:    Reason Eval/Treat Not Completed: Medical issues which prohibited therapy Pt nauseous and vomiting. RN in room and reports pt not up to PT. Will follow up as schedule allows.   Lou Miner, DPT  Acute Rehabilitation Services  Office: [phone]    Rudean Hitt 09/08/2021, 11:47 AM

## 2021-09-08 NOTE — ED Notes (Signed)
Patient transported to CT 

## 2021-09-08 NOTE — H&P (Signed)
History and Physical    Patient: [name] [name] IWL:[id number] DOB: April 17, 1950 DOA: 09/08/2021 DOS: the patient was seen and examined on 09/08/2021 PCP: Pcp, No  Patient coming from: Home  Chief Complaint:  Chief Complaint  Patient presents with   Fall   Code Sepsis   HPI: [name] [name] is a 71 y.o. female with medical history significant of HIV since the 1990's, pulm fibrosis followed by [facility], hypotension on midodrine, CAD who presents after fall overnight. Pt states around midnight on the evening prior to admit, pt got out of bed to go to bathroom. Pt felt light headed and fell. Pt believes she fell onto home O2 tank resulting in marked L sided chest pain. Pt unable to get up even with assistance from family, prompting ED visit.   In Ed, pt was found to have L nondisplaced rib fx and R sided PNA. Pt was found to be tachycardic with WBC of 11k. Pt also found to have lactate of 7.8. Of note, pt noted to be hypotensive en route to ED. Pt was started on broad spec abx and IVF hydration. Repeat lactate down to 3.0.   Review of Systems: As mentioned in the history of present illness. All other systems reviewed and are negative.  PMH -Pulm fibrosis -HIV -Hypotension -CAD  Social History:  has no history on file for tobacco use, alcohol use, and drug use.  Allergies  Allergen Reactions   Bactrim [Sulfamethoxazole-Trimethoprim] Rash    Family hx: -CAD -DM -HTN  Prior to Admission medications   Not on File   Home meds Current Meds  Medication Sig   acetaminophen (TYLENOL) 500 MG tablet Take 1,000 mg by mouth every 6 (six) hours as needed for mild pain.   albuterol (VENTOLIN HFA) 108 (90 Base) MCG/ACT inhaler Inhale 1-2 puffs into the lungs every 6 (six) hours as needed for shortness of breath.   atorvastatin (LIPITOR) 80 MG tablet Take 80 mg by mouth every evening.   BREO ELLIPTA 100-25 MCG/ACT AEPB Inhale 1 puff into the lungs daily.   clopidogrel (PLAVIX) 75 MG tablet  Take 75 mg by mouth daily.   [employer] D3 25 MCG (1000 UT) capsule Take 1,000 Units by mouth daily.   diclofenac Sodium (VOLTAREN) 1 % GEL Apply 2-3 g topically 3 (three) times daily as needed for pain.   DOVATO 50-300 MG tablet Take 1 tablet by mouth daily.   furosemide (LASIX) 20 MG tablet Take 20 mg by mouth daily as needed for edema.   gabapentin (NEURONTIN) 300 MG capsule Take 300 mg by mouth daily.   midodrine (PROAMATINE) 10 MG tablet Take 10 mg by mouth 2 (two) times daily.   nitroGLYCERIN (NITROSTAT) 0.4 MG SL tablet Place 0.4 mg under the tongue every 5 (five) minutes as needed for chest pain.   ondansetron (ZOFRAN) 4 MG tablet Take 4 mg by mouth every 8 (eight) hours as needed for nausea/vomiting.   pantoprazole (PROTONIX) 40 MG tablet Take 40 mg by mouth daily.   ranolazine (RANEXA) 500 MG 12 hr tablet Take 500 mg by mouth 2 (two) times daily.   traZODone (DESYREL) 50 MG tablet Take 50 mg by mouth at bedtime.   trospium (SANCTURA) 20 MG tablet Take 20 mg by mouth 2 (two) times daily.     Physical Exam: Vitals:   09/08/21 0430 09/08/21 0500 09/08/21 0600 09/08/21 0900  BP: 109/89 103/66 (!) 111/59 115/60  Pulse: 94 94 97 83  Resp: '16 18 18 '$ 20  Temp:   98.3 F (36.8 C)   TempSrc:   Tympanic   SpO2: 97% 100% 97% 95%  Weight:      Height:       General exam: Awake, laying in bed, in nad Respiratory system: Normal respiratory effort, no wheezing Cardiovascular system: regular rate, s1, s2 Gastrointestinal system: Soft, nondistended, positive BS Central nervous system: CN2-12 grossly intact, strength intact Extremities: Perfused, no clubbing Skin: Normal skin turgor, no notable skin lesions seen Psychiatry: Mood normal // no visual hallucinations   Data Reviewed:  Labs reviewed: Lactate 3.0, K 3.3, Cr 0.87  Assessment and Plan: Sepsis secondary to PNA present on admit -Presents with elevated lactic acid with leukocytosis, tachycardia in setting of R sided PNA on cxr,  reviewed -Initially given broad spec abx in ED -Will cont on azithro and rocephin -Follow blood cx -Already given IVF bolus in ED with lactate improved to 3.0 from 7.8 -Cont basal IVF, follow serial lactate trends -Repeat CBC in AM  Hypokalemia -would replace -Check Mg level -Repeat lytes in AM  L rib fractures -Cont with analgesia as needed -Consult PT/OT  Lactic acidosis -Likely secondary to presenting sepsis -Cont IVF hydration as tolerated -Follow serial lactate trends  Falls -Multiple falls reported prior to admit -consult PT/OT  Pulm fibrosis -Followed by Pulmonary at [facility] -Cont home regimen -Baseline on 2LNC -Cont O2 as needed -Cont on PRN duonebs  HIV -Followed by [facility] -Cont home antiviral as tolerated -Pt reports last CD4 as "good"  Chronic hypotension -Reports hx hypotension at baseline -Cont on home midodrine  CAD -Seems stable at this time -cont home ASA and plavix and ranexa   Advance Care Planning:   Code Status: Full Code full  Consults:   Family Communication: Pt in room, family not at bedside  Severity of Illness: The appropriate patient status for this patient is INPATIENT. Inpatient status is judged to be reasonable and necessary in order to provide the required intensity of service to ensure the patient's safety. The patient's presenting symptoms, physical exam findings, and initial radiographic and laboratory data in the context of their chronic comorbidities is felt to place them at high risk for further clinical deterioration. Furthermore, it is not anticipated that the patient will be medically stable for discharge from the hospital within 2 midnights of admission.   * I certify that at the point of admission it is my clinical judgment that the patient will require inpatient hospital care spanning beyond 2 midnights from the point of admission due to high intensity of service, high risk for further deterioration and high frequency of  surveillance required.*  Author: Marylu Lund, MD 09/08/2021 9:24 AM  For on call review [email or web address].

## 2021-09-08 NOTE — Progress Notes (Signed)
Peripherally Inserted Central Catheter Placement  The IV Nurse has discussed with the patient and/or persons authorized to consent for the patient, the purpose of this procedure and the potential benefits and risks involved with this procedure.  The benefits include less needle sticks, lab draws from the catheter, and the patient may be discharged home with the catheter. Risks include, but not limited to, infection, bleeding, blood clot (thrombus formation), and puncture of an artery; nerve damage and irregular heartbeat and possibility to perform a PICC exchange if needed/ordered by physician.  Alternatives to this procedure were also discussed.  Bard Power PICC patient education guide, fact sheet on infection prevention and patient information card has been provided to patient /or left at bedside.    PICC Placement Documentation  PICC Double Lumen 00/92/33 Right Basilic 34 cm 1 cm (Active)  Indication for Insertion or Continuance of Line Poor Vasculature-patient has had multiple peripheral attempts or PIVs lasting less than 24 hours 09/08/21 1300  Exposed Catheter (cm) 1 cm 09/08/21 1300  Site Assessment Clean, Dry, Intact 09/08/21 1300  Lumen #1 Status Flushed;Saline locked;Blood return noted 09/08/21 1300  Lumen #2 Status Flushed;Saline locked;Blood return noted 09/08/21 1300  Dressing Type Transparent;Securing device 09/08/21 1300  Dressing Status Antimicrobial disc in place 09/08/21 1300  Safety Lock Not Applicable 00/76/22 6333  Line Care Connections checked and tightened 09/08/21 1300  Line Adjustment (NICU/IV Team Only) No 09/08/21 1300  Dressing Intervention New dressing 09/08/21 1300  Dressing Change Due 09/15/21 09/08/21 [place] 09/08/2021, 1:06 PM

## 2021-09-08 NOTE — Progress Notes (Signed)
Orthopedic Tech Progress Note Patient Details:  [name] [name] May 11, 1950 [id number]  Level 1 trauma, downgraded to a level 2 trauma   Patient ID: Dakisha Narcisa Ganesh, female   DOB: 12/29/1950, 71 y.o.   MRN: [id number]  Janit Pagan 09/08/2021, 12:43 AM

## 2021-09-08 NOTE — Progress Notes (Signed)
Pharmacy Antibiotic Note  [name] [name] is a 71 y.o. female admitted on 09/08/2021 with sepsis.  Pharmacy has been consulted for Vancomycin/Cefepime dosing. WBC 11. Renal function. Lactic acid elevate but trending down.   Plan: Vancomycin 750 mg IV q24h >>>Estimated AUC: 430 Cefepime 2g IV q12h Trend WBC, temp, renal function  F/U infectious work-up Drug levels as indicated   Height: '4\' 11"'$  (149.9 cm) Weight: 63 kg (139 lb) IBW/kg (Calculated) : 43.2  Temp (24hrs), Avg:99.6 F (37.6 C), Min:99.6 F (37.6 C), Max:99.6 F (37.6 C)  Recent Labs  Lab 09/08/21 0052 09/08/21 0230  WBC 11.0*  --   CREATININE 0.87  --   LATICACIDVEN 7.8* 3.0*    Estimated Creatinine Clearance: 48.6 mL/min (by C-G formula based on SCr of 0.87 mg/dL).    Allergies  Allergen Reactions   Bactrim [Sulfamethoxazole-Trimethoprim]     Narda Bonds, PharmD, BCPS Clinical Pharmacist Phone: [phone]

## 2021-09-08 NOTE — Progress Notes (Signed)
Chaplain responded to level I that was downgraded to level II fall on thinners.  Patient was at home where she lives with her daughter.  EMT indicated daughter would be coming.  Patient unavailable at this time.  Chaplain available as needed for patient/family support. [place], [place].    09/08/21 0046  Clinical Encounter Type  Visited With Health care provider;Patient not available  Visit Type Initial;Trauma;ED  Referral From Nurse  Consult/Referral To Chaplain

## 2021-09-08 NOTE — Sepsis Progress Note (Signed)
Following per sepsis protocol   

## 2021-09-09 ENCOUNTER — Other Ambulatory Visit: Payer: Self-pay

## 2021-09-09 DIAGNOSIS — J189 Pneumonia, unspecified organism: Secondary | ICD-10-CM | POA: Diagnosis not present

## 2021-09-09 DIAGNOSIS — E876 Hypokalemia: Secondary | ICD-10-CM | POA: Diagnosis not present

## 2021-09-09 DIAGNOSIS — A419 Sepsis, unspecified organism: Secondary | ICD-10-CM | POA: Diagnosis not present

## 2021-09-09 LAB — CBC
HCT: 32.8 % — ABNORMAL LOW (ref 36.0–46.0)
Hemoglobin: 10.3 g/dL — ABNORMAL LOW (ref 12.0–15.0)
MCH: 32.6 pg (ref 26.0–34.0)
MCHC: 31.4 g/dL (ref 30.0–36.0)
MCV: 103.8 fL — ABNORMAL HIGH (ref 80.0–100.0)
Platelets: 173 10*3/uL (ref 150–400)
RBC: 3.16 MIL/uL — ABNORMAL LOW (ref 3.87–5.11)
RDW: 15 % (ref 11.5–15.5)
WBC: 6.5 10*3/uL (ref 4.0–10.5)
nRBC: 0 % (ref 0.0–0.2)

## 2021-09-09 LAB — T-HELPER CELLS (CD4) COUNT (NOT AT ARMC)
CD4 % Helper T Cell: 24 % — ABNORMAL LOW (ref 33–65)
CD4 T Cell Abs: 231 /uL — ABNORMAL LOW (ref 400–1790)

## 2021-09-09 LAB — COMPREHENSIVE METABOLIC PANEL
ALT: 14 U/L (ref 0–44)
AST: 21 U/L (ref 15–41)
Albumin: 2 g/dL — ABNORMAL LOW (ref 3.5–5.0)
Alkaline Phosphatase: 48 U/L (ref 38–126)
Anion gap: 7 (ref 5–15)
BUN: 10 mg/dL (ref 8–23)
CO2: 20 mmol/L — ABNORMAL LOW (ref 22–32)
Calcium: 7.8 mg/dL — ABNORMAL LOW (ref 8.9–10.3)
Chloride: 115 mmol/L — ABNORMAL HIGH (ref 98–111)
Creatinine, Ser: 0.43 mg/dL — ABNORMAL LOW (ref 0.44–1.00)
GFR, Estimated: >60 mL/min (ref >60)
Glucose, Bld: 84 mg/dL (ref 70–99)
Potassium: 3.2 mmol/L — ABNORMAL LOW (ref 3.5–5.1)
Sodium: 142 mmol/L (ref 135–145)
Total Bilirubin: 1 mg/dL (ref 0.3–1.2)
Total Protein: 4.3 g/dL — ABNORMAL LOW (ref 6.5–8.1)

## 2021-09-09 MED ORDER — AZITHROMYCIN 500 MG PO TABS
500.0000 mg | ORAL_TABLET | Freq: Every day | ORAL | Status: DC
  Administered 2021-09-10 – 2021-09-11 (×2): 500 mg via ORAL
  Filled 2021-09-09 (×2): qty 1

## 2021-09-09 MED ORDER — POTASSIUM CHLORIDE CRYS ER 20 MEQ PO TBCR
60.0000 meq | EXTENDED_RELEASE_TABLET | Freq: Once | ORAL | Status: AC
Start: 2021-09-09 — End: 2021-09-09
  Administered 2021-09-09: 60 meq via ORAL
  Filled 2021-09-09: qty 3

## 2021-09-09 NOTE — Progress Notes (Signed)
Progress Note   Patient: [name] [name]:[id number] DOB: May 27, 1950 DOA: 09/08/2021     1 DOS: the patient was seen and examined on 09/09/2021   Brief hospital course: 71 y.o. female with medical history significant of HIV since the 1990's, pulm fibrosis followed by [facility], hypotension on midodrine, CAD who presents after fall overnight. Pt states around midnight on the evening prior to admit, pt got out of bed to go to bathroom. Pt felt light headed and fell. Pt believes she fell onto home O2 tank resulting in marked L sided chest pain. Pt unable to get up even with assistance from family, prompting ED visit.    In Ed, pt was found to have L nondisplaced rib fx and R sided PNA. Pt was found to be tachycardic with WBC of 11k. Pt also found to have lactate of 7.8. Of note, pt noted to be hypotensive en route to ED. Pt was started on broad spec abx and IVF hydration.  Assessment and Plan: Sepsis secondary to PNA present on admit -Presents with elevated lactic acid with leukocytosis, tachycardia in setting of R sided PNA on cxr, reviewed -Initially given broad spec abx in ED -Will cont on azithro and rocephin -Follow blood cx, neg thus far -Lactate normalized with IVF   Hypokalemia -will replace -Repeat lytes in AM   L rib fractures -Cont with analgesia as needed -Consulted PT/OT, recs for HHPT   Lactic acidosis -Likely secondary to presenting sepsis -resolved with IVF  Falls -Multiple falls reported prior to admit -consulted PT/OT with recs for HHPT   Pulm fibrosis -Followed by Pulmonary at [facility] -Cont home regimen -Baseline on 2LNC -Cont O2 as needed -remains stable   HIV -Followed by [facility] -Cont home antiviral as tolerated -CD4 of 24%   Chronic hypotension -Reports hx hypotension at baseline -Cont on home midodrine   CAD -Seems stable at this time -cont home ASA and plavix and ranexa  Ecoli bacturia  - Presenting UA unremarkable, reviewed -Urine cx pos for  >100,000 ecoli -On rocephin per above      Subjective: Complaining of feeling nauseated this AM  Physical Exam: Vitals:   09/09/21 0325 09/09/21 0749 09/09/21 0816 09/09/21 1100  BP: 111/65 (!) 113/58  127/71  Pulse: 61 79 73 77  Resp: 18 (!) 22 19 15   Temp: 98.3 F (36.8 C) 97.6 F (36.4 C)  97.6 F (36.4 C)  TempSrc: Oral Oral  Oral  SpO2: 100% 100% 100% 100%  Weight:      Height:       General exam: Awake, sitting in chair, appears uncomfortable Respiratory system: Normal respiratory effort, no wheezing Cardiovascular system: regular rate, s1, s2 Gastrointestinal system: Soft, nondistended, positive BS Central nervous system: CN2-12 grossly intact, strength intact Extremities: Perfused, no clubbing Skin: Normal skin turgor, no notable skin lesions seen Psychiatry: Mood normal // no visual hallucinations   Data Reviewed:  Labs reviewed: K 3.2, Cr 0.43  Family Communication: Pt in room, family not at bedside  Disposition: Status is: Inpatient Remains inpatient appropriate because: Severity of illness  Planned Discharge Destination: Home    Author: Rickey Barbara, MD 09/09/2021 3:48 PM  For on call review [email or web address].

## 2021-09-09 NOTE — Hospital Course (Signed)
71 y.o. female with medical history significant of HIV since the 1990's, pulm fibrosis followed by [facility], hypotension on midodrine, CAD who presents after fall overnight. Pt states around midnight on the evening prior to admit, pt got out of bed to go to bathroom. Pt felt light headed and fell. Pt believes she fell onto home O2 tank resulting in marked L sided chest pain. Pt unable to get up even with assistance from family, prompting ED visit.    In Ed, pt was found to have L nondisplaced rib fx and R sided PNA. Pt was found to be tachycardic with WBC of 11k. Pt also found to have lactate of 7.8. Of note, pt noted to be hypotensive en route to ED. Pt was started on broad spec abx and IVF hydration.

## 2021-09-09 NOTE — Evaluation (Addendum)
Physical Therapy Evaluation Patient Details Name: [name] [name] MRN: [id number] DOB: 04/23/1950 Today's Date: 09/09/2021  History of Present Illness  Pt is a 71 y.o. female admitted 09/08/21 after feeling lightheaded and falling (pt suspects onto O2 tank) sustaining L-side chest pain. Workup revealed L nondisplaced rib fx, R-sided PNA, tachycardia, hypotension. PMH includes HIV (since 1990s), pulmonary fibrosis, hypotension on midodrine, CAD.   Clinical Impression  Pt presents with an overall decrease in functional mobility secondary to above. PTA, pt reports ambulatory with rollator, sponge bathes at sink, lives with daughter and grandson; pt inconsistent historian, but suspect that family assists with ADL/iADLs as needed. Today, pt requiring minA for standing and short ambulation distance with RW; mobility limited by c/o dizziness, fatigue, L-side abdominal pain, and nausea/vomiting (of note, pt premedicated with pain meds). Pt would benefit from continued acute PT services to maximize functional mobility and independence prior to d/c with HHPT services.     Orthostatic BPs Supine 113/58  Sitting 127/73  Standing 104/67  Post-ambulation 126/78    SpO2 100% on RA, HR 82   Recommendations for follow up therapy are one component of a multi-disciplinary discharge planning process, led by the attending physician.  Recommendations may be updated based on patient status, additional functional criteria and insurance authorization.  Follow Up Recommendations Home health PT      Assistance Recommended at Discharge Frequent or constant Supervision/Assistance  Patient can return home with the following  A little help with walking and/or transfers;A little help with bathing/dressing/bathroom;Assistance with cooking/housework;Assist for transportation;Help with stairs or ramp for entrance;Direct supervision/assist for financial management;Direct supervision/assist for medications management     Equipment Recommendations  (TBD)  Recommendations for Other Services   Mobility Specialist   Functional Status Assessment Patient has had a recent decline in their functional status and demonstrates the ability to make significant improvements in function in a reasonable and predictable amount of time.     Precautions / Restrictions Precautions Precautions: Fall;Other (comment) Precaution Comments: L rib fx; wears 2L O2 baseline Restrictions Weight Bearing Restrictions: No      Mobility  Bed Mobility Overal bed mobility: Needs Assistance Bed Mobility: Supine to Sit     Supine to sit: Min assist, HOB elevated     General bed mobility comments: minA for trunk elevation, pt able to scoot self to EOB without assist    Transfers Overall transfer level: Needs assistance Equipment used: Rolling walker (2 wheels) Transfers: Sit to/from Stand Sit to Stand: Min assist           General transfer comment: minA for trunk elevation, pt c/o dizziness and nausea upon standing, noted drop in BP    Ambulation/Gait Ambulation/Gait assistance: Min assist Gait Distance (Feet): 12 Feet Assistive device: Rolling walker (2 wheels) Gait Pattern/deviations: Step-to pattern, Step-through pattern, Decreased stride length, Trunk flexed Gait velocity: Decreased   Pre-gait activities: prolonged static standing for BP measurement, pt requiring max encouragement to stay standing with intermittent minA to maintain upright General Gait Details: slow, fatigued, mildly unsteady gait with RW and intermittent minA for stability and RW management; further distance deferred due to c/o dizziness, nausea and lethargy  Stairs            Wheelchair Mobility    Modified Rankin (Stroke Patients Only)       Balance Overall balance assessment: Needs assistance   Sitting balance-Leahy Scale: Fair     Standing balance support: No upper extremity supported, Bilateral upper extremity supported,  During functional  activity Standing balance-Leahy Scale: Poor Standing balance comment: able to briefly take BUE support off RW with close min guard, static and dynamic stability improved with BUE support                             Pertinent Vitals/Pain Pain Assessment Pain Assessment: Faces Faces Pain Scale: Hurts little more Pain Location: L-side ribs Pain Descriptors / Indicators: Discomfort, Grimacing, Guarding Pain Intervention(s): Monitored during session, Premedicated before session    Home Living Family/patient expects to be discharged to:: Private residence Living Arrangements: Children;Other relatives Available Help at Discharge: Family;Available 24 hours/day Type of Home: Apartment         Home Layout: One level Home Equipment: Rollator (4 wheels) Additional Comments: Lives with daughter and grandson    Prior Function Prior Level of Function : Needs assist;Patient poor historian/Family not available             Mobility Comments: Ambulatory with rollator. working with HHPT/OT ADLs Comments: inconsistent historian - initially reports she gets into shower, later reports she sponge bathes at sink. suspect family assists with iADLs     Hand Dominance        Extremity/Trunk Assessment   Upper Extremity Assessment Upper Extremity Assessment: Generalized weakness    Lower Extremity Assessment Lower Extremity Assessment: Generalized weakness       Communication   Communication: No difficulties  Cognition Arousal/Alertness: Awake/alert Behavior During Therapy: Flat affect Overall Cognitive Status: No family/caregiver present to determine baseline cognitive functioning Area of Impairment: Attention, Following commands, Safety/judgement, Awareness, Problem solving                   Current Attention Level: Selective   Following Commands: Follows one step commands with increased time Safety/Judgement: Decreased awareness of safety,  Decreased awareness of deficits Awareness: Emergent Problem Solving: Slow processing, Requires verbal cues General Comments: pt premedicated with pain meds - likely exacerbating symptoms and apparent cognitive deficits; unsure baseline        General Comments General comments (skin integrity, edema, etc.): BP from 127/73 sitting, down to 104/67 standing; SpO2 100% on RA    Exercises     Assessment/Plan    PT Assessment Patient needs continued PT services  PT Problem List Decreased strength;Decreased activity tolerance;Decreased balance;Decreased mobility;Decreased knowledge of use of DME;Cardiopulmonary status limiting activity;Decreased knowledge of precautions;Pain       PT Treatment Interventions DME instruction;Gait training;Stair training;Functional mobility training;Therapeutic activities;Therapeutic exercise;Balance training;Patient/family education    PT Goals (Current goals can be found in the Care Plan section)  Acute Rehab PT Goals Patient Stated Goal: feel better PT Goal Formulation: With patient Time For Goal Achievement: 09/23/21 Potential to Achieve Goals: Good    Frequency Min 3X/week     Co-evaluation               AM-PAC PT "6 Clicks" Mobility  Outcome Measure Help needed turning from your back to your side while in a flat bed without using bedrails?: A Little Help needed moving from lying on your back to sitting on the side of a flat bed without using bedrails?: A Little Help needed moving to and from a bed to a chair (including a wheelchair)?: A Little Help needed standing up from a chair using your arms (e.g., wheelchair or bedside chair)?: A Little Help needed to walk in hospital room?: A Little Help needed climbing 3-5 steps with a railing? : A Lot 6  Click Score: 17    End of Session Equipment Utilized During Treatment: Gait belt Activity Tolerance: Patient limited by fatigue;Other (comment) (nausea/vomiting, dizziness) Patient left: in  chair;with call bell/phone within reach;with chair alarm set;with nursing/sitter in room Nurse Communication: Mobility status;Other (comment) (pt requests nausea meds) PT Visit Diagnosis: Other abnormalities of gait and mobility (R26.89);Muscle weakness (generalized) (M62.81);Pain    Time: 1308-6578 PT Time Calculation (min) (ACUTE ONLY): 20 min   Charges:   PT Evaluation $PT Eval Moderate Complexity: 1 Mod         Ina Homes, PT, DPT Acute Rehabilitation Services  Personal: Secure Chat Rehab Office: [phone]  Malachy Chamber 09/09/2021, 11:16 AM

## 2021-09-09 NOTE — Evaluation (Signed)
Occupational Therapy Evaluation Patient Details Name: [name] [name] MRN: [id number] DOB: 1950/07/10 Today's Date: 09/09/2021   History of Present Illness Pt is a 71 y.o. female admitted 09/08/21 after feeling lightheaded and falling (pt suspects onto O2 tank) sustaining L-side chest pain. Workup revealed L nondisplaced rib fx, R-sided PNA, tachycardia, hypotension. PMH includes HIV (since 1990s), pulmonary fibrosis, hypotension on midodrine, CAD.   Clinical Impression   Pt is poor historian and no family present during evaluation, uses Rollator at home and reports independent in ADL, assist from family for IADL. Today she is min A for transfers and short in room mobility. Total A for LB dressing due to pain in ribs limiting ROM for LB access, she was set up for grooming tasks in reclienr, and will benefit from continued OT in the acute setting and afterwards at [facility] to maximize safety and independence in ADL and functional transfers. Next session to demonstrate AE for LB ADL and continue with OOB activity.       Recommendations for follow up therapy are one component of a multi-disciplinary discharge planning process, led by the attending physician.  Recommendations may be updated based on patient status, additional functional criteria and insurance authorization.   Follow Up Recommendations  Home health OT (continue HHOT)    Assistance Recommended at Discharge Intermittent Supervision/Assistance  Patient can return home with the following A little help with walking and/or transfers;A lot of help with bathing/dressing/bathroom;Assistance with cooking/housework;Direct supervision/assist for medications management;Direct supervision/assist for financial management;Assist for transportation;Help with stairs or ramp for entrance    Functional Status Assessment  Patient has had a recent decline in their functional status and demonstrates the ability to make significant improvements in function in  a reasonable and predictable amount of time.  Equipment Recommendations  BSC/3in1    Recommendations for Other Services PT consult     Precautions / Restrictions Precautions Precautions: Fall;Other (comment) Precaution Comments: L rib fx; wears 2L O2 baseline Restrictions Weight Bearing Restrictions: No      Mobility Bed Mobility Overal bed mobility: Needs Assistance Bed Mobility: Supine to Sit     Supine to sit: Min assist, HOB elevated     General bed mobility comments: minA for trunk elevation, pt able to scoot self to EOB without assist    Transfers Overall transfer level: Needs assistance Equipment used: Rolling walker (2 wheels) Transfers: Sit to/from Stand Sit to Stand: Min assist           General transfer comment: minA for trunk elevation, pt c/o dizziness and nausea upon standing, noted drop in BP      Balance Overall balance assessment: Needs assistance   Sitting balance-Leahy Scale: Fair     Standing balance support: No upper extremity supported, Bilateral upper extremity supported, During functional activity Standing balance-Leahy Scale: Poor Standing balance comment: able to briefly take BUE support off RW with close min guard, static and dynamic stability improved with BUE support                           ADL either performed or assessed with clinical judgement   ADL Overall ADL's : Needs assistance/impaired Eating/Feeding: Set up;Sitting   Grooming: Set up;Sitting Grooming Details (indicate cue type and reason): in recliner Upper Body Bathing: Moderate assistance Upper Body Bathing Details (indicate cue type and reason): for back Lower Body Bathing: Maximal assistance;Sitting/lateral leans Lower Body Bathing Details (indicate cue type and reason): Pt reports that family has  ordered a shower chair, but it has not come yet. Upper Body Dressing : Moderate assistance;Sitting   Lower Body Dressing: Maximal assistance;Sit to/from  stand Lower Body Dressing Details (indicate cue type and reason): rib pain impacting ability to get to LB for ADL Toilet Transfer: Minimal assistance;Ambulation;Rolling walker (2 wheels) Toilet Transfer Details (indicate cue type and reason): increased time and verbal cues for safe use of RW Toileting- Clothing Manipulation and Hygiene: Moderate assistance;Sit to/from stand Toileting - Clothing Manipulation Details (indicate cue type and reason): Pt typically uses adulat diaper/pull up in home environment Tub/ Shower Transfer: Minimal assistance;Ambulation;Rolling walker (2 wheels) Tub/Shower Transfer Details (indicate cue type and reason): Pt reports that she sponge bathes, and then that she stands - reports that she is waiting on shower chair?!?! Functional mobility during ADLs: Minimal assistance;Rolling walker (2 wheels) General ADL Comments: decreased reliability as historian, decreased access to LB due to rub fx pain,     Vision         Perception     Praxis      Pertinent Vitals/Pain Pain Assessment Pain Assessment: Faces Faces Pain Scale: Hurts little more Pain Location: L-side ribs Pain Descriptors / Indicators: Discomfort, Grimacing, Guarding Pain Intervention(s): Limited activity within patient's tolerance, Monitored during session, Repositioned     Hand Dominance     Extremity/Trunk Assessment Upper Extremity Assessment Upper Extremity Assessment: Generalized weakness   Lower Extremity Assessment Lower Extremity Assessment: Defer to PT evaluation   Cervical / Trunk Assessment Cervical / Trunk Assessment: Other exceptions (fwd head and rounded shoulders)   Communication Communication Communication: No difficulties   Cognition Arousal/Alertness: Awake/alert Behavior During Therapy: Flat affect Overall Cognitive Status: No family/caregiver present to determine baseline cognitive functioning Area of Impairment: Attention, Following commands, Safety/judgement,  Awareness, Problem solving                   Current Attention Level: Selective   Following Commands: Follows one step commands with increased time Safety/Judgement: Decreased awareness of safety, Decreased awareness of deficits Awareness: Emergent Problem Solving: Slow processing, Requires verbal cues General Comments: pt premedicated with pain meds - likely exacerbating symptoms and apparent cognitive deficits; unsure baseline     General Comments  BP from 127/73 sitting, down to 104/67 standing; SpO2 100% on RA    Exercises     Shoulder Instructions      Home Living Family/patient expects to be discharged to:: Private residence Living Arrangements: Children;Other relatives Available Help at Discharge: Family;Available 24 hours/day Type of Home: Apartment       Home Layout: One level     Bathroom Shower/Tub: Chief Strategy Officer: Standard     Home Equipment: Rollator (4 wheels)   Additional Comments: Lives with daughter and grandson      Prior Functioning/Environment Prior Level of Function : Needs assist;Patient poor historian/Family not available             Mobility Comments: Ambulatory with rollator. working with HHPT/OT ADLs Comments: inconsistent historian - initially reports she gets into shower, later reports she sponge bathes at sink. suspect family assists with iADLs        OT Problem List: Decreased strength;Decreased activity tolerance;Impaired balance (sitting and/or standing);Decreased safety awareness;Pain      OT Treatment/Interventions: Self-care/ADL training;DME and/or AE instruction;Energy conservation;Therapeutic activities;Patient/family education;Balance training    OT Goals(Current goals can be found in the care plan section) Acute Rehab OT Goals Patient Stated Goal: to feel better OT Goal Formulation: With patient  Time For Goal Achievement: 09/23/21 Potential to Achieve Goals: Good ADL Goals Pt Will Perform  Grooming: with supervision;standing Pt Will Perform Upper Body Dressing: with modified independence;sitting Pt Will Perform Lower Body Dressing: with supervision;with adaptive equipment;sit to/from stand Pt Will Transfer to Toilet: with supervision;ambulating Pt Will Perform Toileting - Clothing Manipulation and hygiene: with min guard assist;with caregiver independent in assisting;sit to/from stand Additional ADL Goal #1: Pt will verbalize 3 strategies to decrease risk of falls in the home environment during ADL  OT Frequency: Min 2X/week    Co-evaluation PT/OT/SLP Co-Evaluation/Treatment: Yes Reason for Co-Treatment: For patient/therapist safety;To address functional/ADL transfers PT goals addressed during session: Mobility/safety with mobility;Balance;Proper use of DME;Strengthening/ROM OT goals addressed during session: ADL's and self-care;Strengthening/ROM;Proper use of Adaptive equipment and DME      AM-PAC OT "6 Clicks" Daily Activity     Outcome Measure Help from another person eating meals?: None Help from another person taking care of personal grooming?: A Little Help from another person toileting, which includes using toliet, bedpan, or urinal?: A Lot Help from another person bathing (including washing, rinsing, drying)?: A Lot Help from another person to put on and taking off regular upper body clothing?: A Little Help from another person to put on and taking off regular lower body clothing?: A Lot 6 Click Score: 16   End of Session Equipment Utilized During Treatment: Gait belt;Rolling walker (2 wheels);Oxygen (2L at beginning and end of session) Nurse Communication: Mobility status;Other (comment) (requests nausea medicine)  Activity Tolerance: Other (comment);Patient tolerated treatment well (nausea) Patient left: in chair;with call bell/phone within reach;with chair alarm set;with nursing/sitter in room  OT Visit Diagnosis: Unsteadiness on feet (R26.81);Repeated falls  (R29.6);Muscle weakness (generalized) (M62.81);History of falling (Z91.81);Other symptoms and signs involving cognitive function                Time: 1610-9604 OT Time Calculation (min): 23 min Charges:  OT General Charges $OT Visit: 1 Visit OT Evaluation $OT Eval Moderate Complexity: 1 Mod  Nyoka Cowden OTR/L Acute Rehabilitation Services Office: [phone]  Evern Bio [facility] 09/09/2021, 12:02 PM

## 2021-09-10 DIAGNOSIS — J189 Pneumonia, unspecified organism: Secondary | ICD-10-CM | POA: Diagnosis not present

## 2021-09-10 DIAGNOSIS — A419 Sepsis, unspecified organism: Secondary | ICD-10-CM | POA: Diagnosis not present

## 2021-09-10 DIAGNOSIS — E872 Acidosis, unspecified: Secondary | ICD-10-CM | POA: Diagnosis not present

## 2021-09-10 DIAGNOSIS — E876 Hypokalemia: Secondary | ICD-10-CM | POA: Diagnosis not present

## 2021-09-10 LAB — HIV-1 RNA QUANT-NO REFLEX-BLD
HIV 1 RNA Quant: <20 copies/mL
LOG10 HIV-1 RNA: UNDETERMINED log10copy/mL

## 2021-09-10 LAB — CBC
HCT: 28.6 % — ABNORMAL LOW (ref 36.0–46.0)
Hemoglobin: 9.5 g/dL — ABNORMAL LOW (ref 12.0–15.0)
MCH: 34.1 pg — ABNORMAL HIGH (ref 26.0–34.0)
MCHC: 33.2 g/dL (ref 30.0–36.0)
MCV: 102.5 fL — ABNORMAL HIGH (ref 80.0–100.0)
Platelets: 150 10*3/uL (ref 150–400)
RBC: 2.79 MIL/uL — ABNORMAL LOW (ref 3.87–5.11)
RDW: 14.8 % (ref 11.5–15.5)
WBC: 5 10*3/uL (ref 4.0–10.5)
nRBC: 0 % (ref 0.0–0.2)

## 2021-09-10 LAB — COMPREHENSIVE METABOLIC PANEL
ALT: 13 U/L (ref 0–44)
AST: 17 U/L (ref 15–41)
Albumin: 1.6 g/dL — ABNORMAL LOW (ref 3.5–5.0)
Alkaline Phosphatase: 43 U/L (ref 38–126)
Anion gap: 5 (ref 5–15)
BUN: 5 mg/dL — ABNORMAL LOW (ref 8–23)
CO2: 19 mmol/L — ABNORMAL LOW (ref 22–32)
Calcium: 6.9 mg/dL — ABNORMAL LOW (ref 8.9–10.3)
Chloride: 116 mmol/L — ABNORMAL HIGH (ref 98–111)
Creatinine, Ser: <0.3 mg/dL — ABNORMAL LOW (ref 0.44–1.00)
Glucose, Bld: 74 mg/dL (ref 70–99)
Potassium: 3.3 mmol/L — ABNORMAL LOW (ref 3.5–5.1)
Sodium: 140 mmol/L (ref 135–145)
Total Bilirubin: 0.7 mg/dL (ref 0.3–1.2)
Total Protein: 3.5 g/dL — ABNORMAL LOW (ref 6.5–8.1)

## 2021-09-10 LAB — TROPONIN I (HIGH SENSITIVITY)
Troponin I (High Sensitivity): 5 ng/L (ref <18)
Troponin I (High Sensitivity): 7 ng/L (ref <18)

## 2021-09-10 LAB — HEMOGLOBIN A1C
Hgb A1c MFr Bld: 5.1 % (ref 4.8–5.6)
Mean Plasma Glucose: 100 mg/dL

## 2021-09-10 MED ORDER — LIDOCAINE 5 % EX PTCH
1.0000 | MEDICATED_PATCH | CUTANEOUS | Status: DC
Start: 2021-09-10 — End: 2021-09-10

## 2021-09-10 MED ORDER — HYDROMORPHONE HCL 1 MG/ML IJ SOLN: 0.5000 mg | INTRAMUSCULAR | Status: DC | PRN

## 2021-09-10 MED ORDER — LIDOCAINE 5 % EX PTCH
1.0000 | MEDICATED_PATCH | CUTANEOUS | Status: DC
  Administered 2021-09-10 – 2021-09-11 (×2): 1 via TRANSDERMAL
  Filled 2021-09-10 (×2): qty 1

## 2021-09-10 MED ORDER — GABAPENTIN 300 MG PO CAPS
300.0000 mg | ORAL_CAPSULE | Freq: Two times a day (BID) | ORAL | Status: DC
  Administered 2021-09-10 – 2021-09-11 (×2): 300 mg via ORAL
  Filled 2021-09-10 (×2): qty 1

## 2021-09-10 MED ORDER — SENNOSIDES-DOCUSATE SODIUM 8.6-50 MG PO TABS
2.0000 | ORAL_TABLET | Freq: Every day | ORAL | Status: DC
  Administered 2021-09-10: 2 via ORAL
  Filled 2021-09-10: qty 2

## 2021-09-10 MED ORDER — PROCHLORPERAZINE EDISYLATE 10 MG/2ML IJ SOLN
10.0000 mg | Freq: Four times a day (QID) | INTRAMUSCULAR | Status: DC | PRN
Start: 2021-09-10 — End: 2021-09-11
  Administered 2021-09-10 – 2021-09-11 (×2): 10 mg via INTRAVENOUS
  Filled 2021-09-10 (×2): qty 2

## 2021-09-10 MED ORDER — TRAMADOL HCL 50 MG PO TABS
50.0000 mg | ORAL_TABLET | Freq: Four times a day (QID) | ORAL | Status: DC | PRN
  Administered 2021-09-10 – 2021-09-11 (×3): 50 mg via ORAL
  Filled 2021-09-10 (×3): qty 1

## 2021-09-10 MED ORDER — TRAMADOL HCL 50 MG PO TABS: 50.0000 mg | ORAL_TABLET | Freq: Four times a day (QID) | ORAL | Status: DC | PRN

## 2021-09-10 NOTE — ED Provider Notes (Signed)
[facility] 4E CV SURGICAL PROGRESSIVE CARE Provider Note   CSN: [id number] Arrival date & time: 09/08/21  0038     History  Chief Complaint  Patient presents with   Fall   Code Sepsis    [name] [name] is a 71 y.o. female.  Patient brought to the emergency department for fall at home.  Patient initially labeled a level 1 trauma because she was a fall on thinners and hypotensive.  She was, however, found to be febrile by EMS and low blood pressure was thought to be secondary to infection and possible sepsis.  Patient administered IV fluids by EMS with improvement of her blood pressure.  Upon arrival to the ER, patient complaining of left-sided rib and chest pain after the fall.       Home Medications Prior to Admission medications   Medication Sig Start Date End Date Taking? Authorizing Provider  acetaminophen (TYLENOL) 500 MG tablet Take 1,000 mg by mouth every 6 (six) hours as needed for mild pain.   Yes [provider]  albuterol (VENTOLIN HFA) 108 (90 Base) MCG/ACT inhaler Inhale 1-2 puffs into the lungs every 6 (six) hours as needed for shortness of breath. 08/21/21  Yes [provider]  atorvastatin (LIPITOR) 80 MG tablet Take 80 mg by mouth every evening. 08/17/21  Yes [provider]  BREO ELLIPTA 100-25 MCG/ACT AEPB Inhale 1 puff into the lungs daily. 08/21/21  Yes [provider]  clopidogrel (PLAVIX) 75 MG tablet Take 75 mg by mouth daily. 08/21/21  Yes [provider]  [employer] D3 25 MCG (1000 UT) capsule Take 1,000 Units by mouth daily. 04/07/21  Yes [provider]  diclofenac Sodium (VOLTAREN) 1 % GEL Apply 2-3 g topically 3 (three) times daily as needed for pain. 07/29/21  Yes [provider]  DOVATO 50-300 MG tablet Take 1 tablet by mouth daily. 08/15/21  Yes [provider]  furosemide (LASIX) 20 MG tablet Take 20 mg by mouth daily as needed for edema. 07/20/21  Yes [provider]  gabapentin  (NEURONTIN) 300 MG capsule Take 300 mg by mouth daily. 08/23/21  Yes [provider]  midodrine (PROAMATINE) 10 MG tablet Take 10 mg by mouth 2 (two) times daily. 07/20/21  Yes [provider]  nitroGLYCERIN (NITROSTAT) 0.4 MG SL tablet Place 0.4 mg under the tongue every 5 (five) minutes as needed for chest pain. 07/20/21  Yes [provider]  ondansetron (ZOFRAN) 4 MG tablet Take 4 mg by mouth every 8 (eight) hours as needed for nausea/vomiting. 08/21/21  Yes [provider]  pantoprazole (PROTONIX) 40 MG tablet Take 40 mg by mouth daily. 08/23/21  Yes [provider]  ranolazine (RANEXA) 500 MG 12 hr tablet Take 500 mg by mouth 2 (two) times daily. 08/17/21  Yes [provider]  traZODone (DESYREL) 50 MG tablet Take 50 mg by mouth at bedtime. 07/25/21  Yes [provider]  trospium (SANCTURA) 20 MG tablet Take 20 mg by mouth 2 (two) times daily. 07/20/21  Yes [provider]  methocarbamol (ROBAXIN) 500 MG tablet Take 500 mg by mouth every 6 (six) hours as needed for muscle spasms. 08/21/21   [provider]      Allergies    Bactrim [sulfamethoxazole-trimethoprim]    Review of Systems   Review of Systems  Physical Exam Updated Vital Signs BP 104/61 (BP Location: Left Arm)   Pulse 68   Temp 97.6 F (36.4 C) (Oral)   Resp  11   Ht  (1.499 m)   Wt 63 kg   SpO2 100%   BMI 28.07 kg/m  Physical Exam Vitals and nursing note reviewed.  Constitutional:      General: She is not in acute distress.    Appearance: She is well-developed.  HENT:     Head: Normocephalic and atraumatic.     Mouth/Throat:     Mouth: Mucous membranes are moist.  Eyes:     General: Vision grossly intact. Gaze aligned appropriately.     Extraocular Movements: Extraocular movements intact.     Conjunctiva/sclera: Conjunctivae normal.  Cardiovascular:     Rate and Rhythm: Regular rhythm. Tachycardia present.     Pulses: Normal  pulses.     Heart sounds: Normal heart sounds, S1 normal and S2 normal. No murmur heard.    No friction rub. No gallop.  Pulmonary:     Effort: Pulmonary effort is normal. Tachypnea present. No respiratory distress.     Breath sounds: Normal breath sounds.  Chest:     Chest wall: Deformity and tenderness (Left side) present.  Abdominal:     General: Bowel sounds are normal.     Palpations: Abdomen is soft.     Tenderness: There is no abdominal tenderness. There is no guarding or rebound.     Hernia: No hernia is present.  Musculoskeletal:        General: No swelling.     Cervical back: Full passive range of motion without pain, normal range of motion and neck supple. No spinous process tenderness or muscular tenderness. Normal range of motion.     Right lower leg: No edema.     Left lower leg: No edema.  Skin:    General: Skin is warm and dry.     Capillary Refill: Capillary refill takes less than 2 seconds.     Findings: No ecchymosis, erythema, rash or wound.  Neurological:     General: No focal deficit present.     Mental Status: She is alert and oriented to person, place, and time.     GCS: GCS eye subscore is 4. GCS verbal subscore is 5. GCS motor subscore is 6.     Cranial Nerves: Cranial nerves 2-12 are intact.     Sensory: Sensation is intact.     Motor: Motor function is intact.     Coordination: Coordination is intact.  Psychiatric:        Attention and Perception: Attention normal.        Mood and Affect: Mood normal.        Speech: Speech normal.        Behavior: Behavior normal.     ED Results / Procedures / Treatments   Labs (all labs ordered are listed, but only abnormal results are displayed) Labs Reviewed  URINE CULTURE - Abnormal; Notable for the following components:      Result Value   Culture >=100,000 COLONIES/mL ESCHERICHIA COLI (*)    All other components within normal limits  LACTIC ACID, PLASMA - Abnormal; Notable for the following components:    Lactic Acid, Venous 7.8 (*)    All other components within normal limits  LACTIC ACID, PLASMA - Abnormal; Notable for the following components:   Lactic Acid, Venous 3.0 (*)    All other components within normal limits  COMPREHENSIVE METABOLIC PANEL - Abnormal; Notable for the following components:   Potassium 3.3 (*)    CO2 16 (*)    Glucose, Bld  180 (*)    Calcium 8.8 (*)    Total Protein 5.3 (*)    Albumin 2.6 (*)    Anion gap 17 (*)    All other components within normal limits  CBC WITH DIFFERENTIAL/PLATELET - Abnormal; Notable for the following components:   WBC 11.0 (*)    RBC 3.69 (*)    MCV 106.5 (*)    MCH 34.4 (*)    Neutro Abs 8.9 (*)    Monocytes Absolute 1.3 (*)    All other components within normal limits  APTT - Abnormal; Notable for the following components:   aPTT 23 (*)    All other components within normal limits  URINALYSIS, ROUTINE W REFLEX MICROSCOPIC - Abnormal; Notable for the following components:   Color, Urine AMBER (*)    APPearance CLOUDY (*)    Bacteria, UA MANY (*)    All other components within normal limits  SALICYLATE LEVEL - Abnormal; Notable for the following components:   Salicylate Lvl <7.0 (*)    All other components within normal limits  LACTIC ACID, PLASMA - Abnormal; Notable for the following components:   Lactic Acid, Venous 2.4 (*)    All other components within normal limits  CBC - Abnormal; Notable for the following components:   RBC 3.29 (*)    Hemoglobin 11.2 (*)    HCT 34.0 (*)    MCV 103.3 (*)    All other components within normal limits  COMPREHENSIVE METABOLIC PANEL - Abnormal; Notable for the following components:   Potassium 3.2 (*)    Chloride 115 (*)    CO2 20 (*)    Creatinine, Ser 0.43 (*)    Calcium 7.8 (*)    Total Protein 4.3 (*)    Albumin 2.0 (*)    All other components within normal limits  CBC - Abnormal; Notable for the following components:   RBC 3.16 (*)    Hemoglobin 10.3 (*)    HCT 32.8 (*)     MCV 103.8 (*)    All other components within normal limits  T-HELPER CELLS (CD4) COUNT (NOT AT [facility]) - Abnormal; Notable for the following components:   CD4 T Cell Abs 231 (*)    CD4 % Helper T Cell 24 (*)    All other components within normal limits  I-STAT VENOUS BLOOD GAS, ED - Abnormal; Notable for the following components:   pH, Ven 7.445 (*)    pCO2, Ven 22.4 (*)    pO2, Ven 46 (*)    Bicarbonate 15.4 (*)    TCO2 16 (*)    Acid-base deficit 7.0 (*)    Potassium 3.3 (*)    Calcium, Ion 1.06 (*)    All other components within normal limits  CULTURE, BLOOD (ROUTINE X 2)  CULTURE, BLOOD (ROUTINE X 2)  RESP PANEL BY RT-PCR (FLU A&B, COVID) ARPGX2  MRSA NEXT GEN BY PCR, NASAL  PROTIME-INR  LACTIC ACID, PLASMA  MAGNESIUM  CREATININE, SERUM  HEMOGLOBIN A1C  HIV-1 RNA QUANT-NO REFLEX-BLD  COMPREHENSIVE METABOLIC PANEL  CBC  I-STAT BETA HCG BLOOD, ED ([facility], [facility], [facility] ONLY)    EKG EKG Interpretation  Date/Time:  Thursday September 08 2021 00:49:48 EDT Ventricular Rate:  114 PR Interval:  165 QRS Duration: 94 QT Interval:  324 QTC Calculation: 447 R Axis:   259 Text Interpretation: Sinus tachycardia Probable RVH w/ secondary repol abnormality Anterolateral infarct, age indeterminate Confirmed by Gilda Crease [phone]) on 09/08/2021 12:50:35 AM  Radiology [place]  EKG SITE RITE  Result Date: 09/08/2021 If Site Rite image not attached, placement could not be confirmed due to current cardiac rhythm.   Procedures Procedures    Medications Ordered in ED Medications  0.9 %  sodium chloride infusion ( Intravenous Infusion Verify 09/10/21 0056)  acetaminophen (TYLENOL) tablet 650 mg (650 mg Oral Patient Refused/Not Given 09/09/21 2358)  acetaminophen (TYLENOL) tablet 650 mg (has no administration in time range)    Or  acetaminophen (TYLENOL) suppository 650 mg (has no administration in time range)  cefTRIAXone (ROCEPHIN) 2 g in sodium chloride 0.9 % 100 mL IVPB (0 g Intravenous  Stopped 09/09/21 0925)  oxyCODONE (Oxy IR/ROXICODONE) immediate release tablet 5 mg (5 mg Oral Given 09/09/21 2150)  HYDROmorphone (DILAUDID) injection 0.5-1 mg (1 mg Intravenous Given 09/09/21 0838)  docusate sodium (COLACE) capsule 100 mg (100 mg Oral Given 09/09/21 2150)  ipratropium-albuterol (DUONEB) 0.5-2.5 (3) MG/3ML nebulizer solution 3 mL (has no administration in time range)  enoxaparin (LOVENOX) injection 40 mg (40 mg Subcutaneous Given 09/09/21 0836)  atorvastatin (LIPITOR) tablet 80 mg (80 mg Oral Given 09/09/21 1633)  clopidogrel (PLAVIX) tablet 75 mg (75 mg Oral Given 09/09/21 0836)  dolutegravir-lamiVUDine (DOVATO) 50-300 MG per tablet 1 tablet (1 tablet Oral Given 09/09/21 0837)  gabapentin (NEURONTIN) capsule 300 mg (300 mg Oral Given 09/09/21 0836)  methocarbamol (ROBAXIN) tablet 500 mg (500 mg Oral Given 09/09/21 2150)  midodrine (PROAMATINE) tablet 10 mg (10 mg Oral Given 09/09/21 2150)  nitroGLYCERIN (NITROSTAT) SL tablet 0.4 mg (has no administration in time range)  pantoprazole (PROTONIX) EC tablet 40 mg (40 mg Oral Given 09/09/21 0836)  ranolazine (RANEXA) 12 hr tablet 500 mg (500 mg Oral Given 09/09/21 2150)  traZODone (DESYREL) tablet 50 mg (50 mg Oral Given 09/09/21 2150)  darifenacin (ENABLEX) 24 hr tablet 7.5 mg (7.5 mg Oral Given 09/09/21 0836)  fluticasone furoate-vilanterol (BREO ELLIPTA) 100-25 MCG/ACT 1 puff (1 puff Inhalation Given 09/09/21 0816)  ondansetron (ZOFRAN) injection 4 mg (4 mg Intravenous Given 09/09/21 2150)  sodium chloride flush (NS) 0.9 % injection 10-40 mL ( Intracatheter Not Given 09/09/21 2233)  sodium chloride flush (NS) 0.9 % injection 10-40 mL (has no administration in time range)  Chlorhexidine Gluconate Cloth 2 % PADS 6 each (6 each Topical Given 09/09/21 0620)  azithromycin (ZITHROMAX) tablet 500 mg (has no administration in time range)  ceFEPIme (MAXIPIME) 2 g in sodium chloride 0.9 % 100 mL IVPB (0 g Intravenous Stopped 09/08/21 0423)  metroNIDAZOLE  (FLAGYL) IVPB 500 mg (0 mg Intravenous Stopped 09/08/21 0458)  vancomycin (VANCOCIN) IVPB 1000 mg/200 mL premix (0 mg Intravenous Stopped 09/08/21 0614)  sodium chloride 0.9 % bolus 1,000 mL (0 mLs Intravenous Stopped 09/08/21 0527)  sodium chloride 0.9 % bolus 1,000 mL (0 mLs Intravenous Stopped 09/08/21 0615)  fentaNYL (SUBLIMAZE) injection 25 mcg (25 mcg Intravenous Given 09/08/21 0525)  magnesium sulfate IVPB 2 g 50 mL (0 g Intravenous Stopped 09/08/21 1645)  potassium chloride SA (KLOR-CON M) CR tablet 60 mEq (60 mEq Oral Given 09/09/21 1308)    ED Course/ Medical Decision Making/ A&P                           Medical Decision Making Amount and/or Complexity of Data Reviewed Labs: ordered. Radiology: ordered. ECG/medicine tests: ordered.  Risk Prescription drug management. Decision regarding hospitalization.   Presents to the emergency department after a fall.  Initially a level 1 trauma because of hypotension, downgraded  to a level 2 simply because of fall on blood thinners.  No associated neurologic deficit noted.  CT head and cervical spine negative.  Patient febrile for EMS with hypotension.  Sepsis considered more likely a cause for the low blood pressure than her fall.  She was found to have a marked elevation of her lactic acid upon arrival.  She was administered broad-spectrum antibiotics and IV fluids and blood pressures have normalized.  Will require hospitalization for further management of sepsis.  CRITICAL CARE Performed by: Gilda Crease   Total critical care time: 35 minutes  Critical care time was exclusive of separately billable procedures and treating other patients.  Critical care was necessary to treat or prevent imminent or life-threatening deterioration.  Critical care was time spent personally by me on the following activities: development of treatment plan with patient and/or surrogate as well as nursing, discussions with consultants, evaluation of  patient's response to treatment, examination of patient, obtaining history from patient or surrogate, ordering and performing treatments and interventions, ordering and review of laboratory studies, ordering and review of radiographic studies, pulse oximetry and re-evaluation of patient's condition.         Final Clinical Impression(s) / ED Diagnoses Final diagnoses:  Sepsis, due to unspecified organism, unspecified whether acute organ dysfunction present [facility])    Rx / DC Orders ED Discharge Orders     None         Arian Mcquitty, Canary Brim, MD 09/10/21 [phone]

## 2021-09-10 NOTE — Progress Notes (Signed)
Progress Note   Patient: [name] [name] NWG:[id number] DOB: 1950-09-08 DOA: 09/08/2021     2 DOS: the patient was seen and examined on 09/10/2021   Brief hospital course: 71 y.o. female with medical history significant of HIV since the 1990's, pulm fibrosis followed by [facility], hypotension on midodrine, CAD who presents after fall overnight. Pt states around midnight on the evening prior to admit, pt got out of bed to go to bathroom. Pt felt light headed and fell. Pt believes she fell onto home O2 tank resulting in marked L sided chest pain. Pt unable to get up even with assistance from family, prompting ED visit.    In Ed, pt was found to have L nondisplaced rib fx and R sided PNA. Pt was found to be tachycardic with WBC of 11k. Pt also found to have lactate of 7.8. Of note, pt noted to be hypotensive en route to ED. Pt was started on broad spec abx and IVF hydration.  Assessment and Plan: Sepsis secondary to PNA present on admit -Presents with elevated lactic acid with leukocytosis, tachycardia in setting of R sided PNA on cxr, reviewed -Initially given broad spec abx in ED -Will cont on azithro and rocephin -Blood cx neg -Lactate normalized with IVF   Hypokalemia -continue to replace -Repeat lytes in AM   L rib fractures -Cont with analgesia as needed -Consulted PT/OT, recs for HHPT   Lactic acidosis -Likely secondary to presenting sepsis -resolved with IVF  Falls -Multiple falls reported prior to admit -consulted PT/OT with recs for HHPT   Pulm fibrosis -Followed by [facility]monary at [facility] -Cont home regimen -Baseline on 2LNC -Cont O2 as needed -remains stable   HIV -Followed by [facility] -Cont home antiviral as tolerated -CD4 of 24%   Chronic hypotension -Reports hx hypotension at baseline -Cont on home midodrine   CAD -Seems stable at this time -cont home ASA and plavix and ranexa  Ecoli bacturia  - Presenting UA unremarkable, reviewed -Urine cx pos for >100,000  ecoli -On rocephin per above   Nausea/vomiting -Trop neg - Pt reports moving bowels - Consider alternative to current analgesia. Will d/w pharmacy     Subjective: Nauseated this AM  Physical Exam: Vitals:   09/10/21 0331 09/10/21 0844 09/10/21 0918 09/10/21 1224  BP: 115/79  124/80 (!) 103/54  Pulse: 76  76 84  Resp: 12  18 14   Temp: (!) 97.5 F (36.4 C)  97.9 F (36.6 C) 98.1 F (36.7 C)  TempSrc: Oral  Oral Oral  SpO2: 99% 98% 99% 100%  Weight:      Height:       General exam: Conversant, in no acute distress Respiratory system: normal chest rise, clear, no audible wheezing Cardiovascular system: regular rhythm, s1-s2 Gastrointestinal system: Nondistended, nontender, pos BS Central nervous system: No seizures, no tremors Extremities: No cyanosis, no joint deformities Skin: No rashes, no pallor Psychiatry: Affect normal // no auditory hallucinations   Data Reviewed:  Labs reviewed: K 3.3, Cr <0.3  Family Communication: Pt in room, family not at bedside  Disposition: Status is: Inpatient Remains inpatient appropriate because: Severity of illness  Planned Discharge Destination: Home    Author: Rickey Barbara, MD 09/10/2021 1:17 PM  For on call review [email or web address].

## 2021-09-11 DIAGNOSIS — E876 Hypokalemia: Secondary | ICD-10-CM | POA: Diagnosis not present

## 2021-09-11 DIAGNOSIS — A419 Sepsis, unspecified organism: Secondary | ICD-10-CM | POA: Diagnosis not present

## 2021-09-11 DIAGNOSIS — J189 Pneumonia, unspecified organism: Secondary | ICD-10-CM | POA: Diagnosis not present

## 2021-09-11 DIAGNOSIS — E872 Acidosis, unspecified: Secondary | ICD-10-CM | POA: Diagnosis not present

## 2021-09-11 LAB — COMPREHENSIVE METABOLIC PANEL
ALT: 14 U/L (ref 0–44)
AST: 23 U/L (ref 15–41)
Albumin: 1.7 g/dL — ABNORMAL LOW (ref 3.5–5.0)
Alkaline Phosphatase: 41 U/L (ref 38–126)
Anion gap: 4 — ABNORMAL LOW (ref 5–15)
BUN: <5 mg/dL — ABNORMAL LOW (ref 8–23)
CO2: 21 mmol/L — ABNORMAL LOW (ref 22–32)
Calcium: 7.8 mg/dL — ABNORMAL LOW (ref 8.9–10.3)
Chloride: 115 mmol/L — ABNORMAL HIGH (ref 98–111)
Creatinine, Ser: 0.4 mg/dL — ABNORMAL LOW (ref 0.44–1.00)
GFR, Estimated: >60 mL/min (ref >60)
Glucose, Bld: 81 mg/dL (ref 70–99)
Potassium: 3.5 mmol/L (ref 3.5–5.1)
Sodium: 140 mmol/L (ref 135–145)
Total Bilirubin: 0.4 mg/dL (ref 0.3–1.2)
Total Protein: 3.9 g/dL — ABNORMAL LOW (ref 6.5–8.1)

## 2021-09-11 LAB — CBC
HCT: 29 % — ABNORMAL LOW (ref 36.0–46.0)
Hemoglobin: 9.8 g/dL — ABNORMAL LOW (ref 12.0–15.0)
MCH: 34.3 pg — ABNORMAL HIGH (ref 26.0–34.0)
MCHC: 33.8 g/dL (ref 30.0–36.0)
MCV: 101.4 fL — ABNORMAL HIGH (ref 80.0–100.0)
Platelets: 181 10*3/uL (ref 150–400)
RBC: 2.86 MIL/uL — ABNORMAL LOW (ref 3.87–5.11)
RDW: 15 % (ref 11.5–15.5)
WBC: 4 10*3/uL (ref 4.0–10.5)
nRBC: 0 % (ref 0.0–0.2)

## 2021-09-11 LAB — URINE CULTURE: Culture: >=100000 — AB

## 2021-09-11 MED ORDER — PROCHLORPERAZINE MALEATE 5 MG PO TABS: 5.0000 mg | ORAL_TABLET | Freq: Four times a day (QID) | ORAL | 0 refills | Status: AC | PRN

## 2021-09-11 MED ORDER — LIDOCAINE 5 % EX PTCH
1.0000 | MEDICATED_PATCH | CUTANEOUS | 0 refills | Status: AC
Start: 2021-09-11 — End: 2021-10-01

## 2021-09-11 MED ORDER — GABAPENTIN 300 MG PO CAPS: 300.0000 mg | ORAL_CAPSULE | Freq: Two times a day (BID) | ORAL | 0 refills | Status: AC

## 2021-09-11 MED ORDER — AZITHROMYCIN 500 MG PO TABS: 500.0000 mg | ORAL_TABLET | Freq: Every day | ORAL | 0 refills | Status: AC

## 2021-09-11 MED ORDER — TRAMADOL HCL 50 MG PO TABS: 50.0000 mg | ORAL_TABLET | Freq: Four times a day (QID) | ORAL | 0 refills | Status: AC | PRN

## 2021-09-11 MED ORDER — CEFDINIR 300 MG PO CAPS: 300.0000 mg | ORAL_CAPSULE | Freq: Two times a day (BID) | ORAL | 0 refills | Status: AC

## 2021-09-11 MED ORDER — DOCUSATE SODIUM 100 MG PO CAPS: 100.0000 mg | ORAL_CAPSULE | Freq: Two times a day (BID) | ORAL | 0 refills | Status: AC

## 2021-09-11 NOTE — TOC Initial Note (Signed)
Transition of Care [facility]) - Initial/Assessment Note   Patient Details  Name: [name] [name] MRN: [id number] Date of Birth: 1950/12/23  Transition of Care [facility]) CM/SW Contact:    Gayla Medicus., RN Phone Number: 09/11/2021, 10:58 AM  Clinical Narrative:               [name] [name] is a 71 y.o. female with medical history significant of HIV since the 1990's, pulm fibrosis followed by [facility], hypotension on midodrine, CAD who presented  after a fall.    RNCM spoke to patient and offered services.  Patient is already active with [facility] and would like to continue with their services.  Jason at [employer] of patient's orders and services confirmed.  Jasmine at [place] contacted with DME order for [facility] and confirmation received.  BSC to be delivered to patient's room prior to discharge.   Expected Discharge Plan: [place] Barriers to Discharge: No Barriers Identified  Patient Goals and CMS Choice Patient states their goals for this hospitalization and ongoing recovery are:: to return home   Choice offered to / list presented to : Patient  Expected Discharge Plan and Services Expected Discharge Plan: [place]   Discharge Planning Services: CM Consult Post Acute Care Choice: Home Health, Durable Medical Equipment Living arrangements for the past 2 months: Apartment Expected Discharge Date: 09/11/21               DME Arranged: 3-N-1 DME Agency: [employer] Date DME Agency Contacted: 09/11/21 Time DME Agency Contacted: 22 Representative spoke with at DME Agency: [place]: PT, OT [place] Agency: Other - See comment ([place]) Date [place]: 09/11/21 Time [place]: 61 Representative spoke with at [place]: Corene Cornea  Prior Living Arrangements/Services Living arrangements for the past 2 months: Apartment Lives with:: Adult Children Patient language and need for interpreter reviewed:: Yes Do you feel safe  going back to the place where you live?: Yes      Need for Family Participation in Patient Care: Yes (Comment) Care giver support system in place?: Yes (comment) Current home services: Home PT, Home OT Criminal Activity/Legal Involvement Pertinent to Current Situation/Hospitalization: No - Comment as needed  Activities of Daily Living Home Assistive Devices/Equipment: Eyeglasses ADL Screening (condition at time of admission) Patient's cognitive ability adequate to safely complete daily activities?: Yes Is the patient deaf or have difficulty hearing?: No Does the patient have difficulty seeing, even when wearing glasses/contacts?: No Does the patient have difficulty concentrating, remembering, or making decisions?: No Patient able to express need for assistance with ADLs?: Yes Does the patient have difficulty dressing or bathing?: No Independently performs ADLs?: Yes (appropriate for developmental age) Does the patient have difficulty walking or climbing stairs?: Yes Weakness of Legs: Both Weakness of Arms/Hands: None  Permission Sought/Granted                 Emotional Assessment Appearance:: Appears stated age Attitude/Demeanor/Rapport: Gracious Affect (typically observed): Accepting Orientation: : Oriented to Self, Oriented to Place, Oriented to  Time, Oriented to Situation   Psych Involvement: No (comment)  Admission diagnosis:  Sepsis ([place]) [A41.9] Pneumonia [J18.9] Patient Active Problem List   Diagnosis Date Noted   Sepsis due to pneumonia ([place]) 09/08/2021   CAP (community acquired pneumonia) 09/08/2021   Lactic acidosis 09/08/2021   Hypokalemia 09/08/2021   Pneumonia 09/08/2021   PCP:  Pcp, No Pharmacy:   [id number] [place]  [address] [place] [address] Phone: [phone] Fax: [phone]  Social Determinants of Health (SDOH) Interventions   Readmission Risk  Interventions     No data to display

## 2021-09-11 NOTE — Progress Notes (Signed)
RA DL PICC removed per protocol per MD order. Manual pressure applied for 5 mins. Vaseline gauze, gauze, and Tegaderm applied over insertion site. No bleeding or swelling noted. Instructed patient to remain in bed for thirty mins. Educated patient about S/S of infection and when to call MD; no heavy lifting or pressure on right side for 24 hours; keep dressing dry and intact for 24 hours. Pt verbalized comprehension. 

## 2021-09-11 NOTE — Discharge Summary (Signed)
Physician Discharge Summary   Patient: [name] [name] MRN: [id number] DOB: [birth year]  Admit date:     09/08/2021  Discharge date: 09/11/21  Discharge Physician: Marylu Lund   PCP: Pcp, No   Recommendations at discharge:    Follow up with PCP in 1-2 weeks Follow up with primary infectious disease provider as scheduled  Discharge Diagnoses: Principal Problem:   Sepsis due to pneumonia [facility]) Active Problems:   CAP (community acquired pneumonia)   Lactic acidosis   Hypokalemia   Pneumonia  Resolved Problems:   * No resolved hospital problems. *  Hospital Course: 71 y.o. female with medical history significant of HIV since the 1990's, pulm fibrosis followed by [facility], hypotension on midodrine, CAD who presents after fall overnight. Pt states around midnight on the evening prior to admit, pt got out of bed to go to bathroom. Pt felt light headed and fell. Pt believes she fell onto home O2 tank resulting in marked L sided chest pain. Pt unable to get up even with assistance from family, prompting ED visit.    In Ed, pt was found to have L nondisplaced rib fx and R sided PNA. Pt was found to be tachycardic with WBC of 11k. Pt also found to have lactate of 7.8. Of note, pt noted to be hypotensive en route to ED. Pt was started on broad spec abx and IVF hydration.  Assessment and Plan: Sepsis secondary to PNA present on admit -Presents with elevated lactic acid with leukocytosis, tachycardia in setting of R sided PNA on cxr, reviewed -Initially given broad spec abx in ED -Had bee continued on azithro and rocephin, to complete course of azithromycin and omnicef on d/c -Blood cx neg -Lactate normalized with IVF   Hypokalemia -replaced   L rib fractures -Cont with analgesia as needed -Consulted PT/OT, recs for HHPT   Lactic acidosis -Likely secondary to presenting sepsis -resolved with IVF   Falls -Multiple falls reported prior to admit -consulted PT/OT with recs for HHPT    Pulm fibrosis -Followed by Pulmonary at [facility] -Cont home regimen -Baseline on 2LNC -Cont O2 as needed -remains stable   HIV -Followed by [facility] -Cont home antiviral as tolerated -CD4 of 24% -patient reports having follow up appointment with primary ID at [facility] this week. Advised to keep appointment    Chronic hypotension -Reports hx hypotension at baseline -Cont on home midodrine   CAD -Seems stable at this time -cont home ASA and plavix and ranexa   Ecoli bacturia  - Presenting UA unremarkable, reviewed -Urine cx pos for >100,000 ecoli -On abx per above   Nausea/vomiting -Trop neg - Pt reports moving bowels - Changed oxycodone to ultram with lidocaine patch -Nausea resolved         Consultants:  Procedures performed:   Disposition: Home Diet recommendation:  Regular diet DISCHARGE MEDICATION: Allergies as of 09/11/2021       Reactions   Bactrim [sulfamethoxazole-trimethoprim] Rash        Medication List     TAKE these medications    acetaminophen 500 MG tablet Commonly known as: TYLENOL Take 1,000 mg by mouth every 6 (six) hours as needed for mild pain.   albuterol 108 (90 Base) MCG/ACT inhaler Commonly known as: VENTOLIN HFA Inhale 1-2 puffs into the lungs every 6 (six) hours as needed for shortness of breath.   atorvastatin 80 MG tablet Commonly known as: LIPITOR Take 80 mg by mouth every evening.   azithromycin 500 MG tablet Commonly known as:  ZITHROMAX Take 1 tablet (500 mg total) by mouth daily for 2 days.   Breo Ellipta 100-25 MCG/ACT Aepb Generic drug: fluticasone furoate-vilanterol Inhale 1 puff into the lungs daily.   cefdinir 300 MG capsule Commonly known as: OMNICEF Take 1 capsule (300 mg total) by mouth 2 (two) times daily for 2 days.   clopidogrel 75 MG tablet Commonly known as: PLAVIX Take 75 mg by mouth daily.   [employer] D3 25 MCG (1000 UT) capsule Generic drug: Cholecalciferol Take 1,000 Units by mouth daily.   diclofenac  Sodium 1 % Gel Commonly known as: VOLTAREN Apply 2-3 g topically 3 (three) times daily as needed for pain.   docusate sodium 100 MG capsule Commonly known as: COLACE Take 1 capsule (100 mg total) by mouth 2 (two) times daily.   Dovato 50-300 MG tablet Generic drug: dolutegravir-lamiVUDine Take 1 tablet by mouth daily.   furosemide 20 MG tablet Commonly known as: LASIX Take 20 mg by mouth daily as needed for edema.   gabapentin 300 MG capsule Commonly known as: NEURONTIN Take 1 capsule (300 mg total) by mouth 2 (two) times daily. What changed: when to take this   lidocaine 5 % Commonly known as: LIDODERM Place 1 patch onto the skin daily for 20 days. Remove & Discard patch within 12 hours or as directed by MD   methocarbamol 500 MG tablet Commonly known as: ROBAXIN Take 500 mg by mouth every 6 (six) hours as needed for muscle spasms.   midodrine 10 MG tablet Commonly known as: PROAMATINE Take 10 mg by mouth 2 (two) times daily.   nitroGLYCERIN 0.4 MG SL tablet Commonly known as: NITROSTAT Place 0.4 mg under the tongue every 5 (five) minutes as needed for chest pain.   ondansetron 4 MG tablet Commonly known as: ZOFRAN Take 4 mg by mouth every 8 (eight) hours as needed for nausea/vomiting.   pantoprazole 40 MG tablet Commonly known as: PROTONIX Take 40 mg by mouth daily.   prochlorperazine 5 MG tablet Commonly known as: COMPAZINE Take 1 tablet (5 mg total) by mouth every 6 (six) hours as needed for nausea or vomiting.   ranolazine 500 MG 12 hr tablet Commonly known as: RANEXA Take 500 mg by mouth 2 (two) times daily.   traMADol 50 MG tablet Commonly known as: ULTRAM Take 1 tablet (50 mg total) by mouth every 6 (six) hours as needed for moderate pain.   traZODone 50 MG tablet Commonly known as: DESYREL Take 50 mg by mouth at bedtime.   trospium 20 MG tablet Commonly known as: SANCTURA Take 20 mg by mouth 2 (two) times daily.               Durable  Medical Equipment  (From admission, onward)           Start     Ordered   09/11/21 1008  For home use only DME 3 n 1  Once        09/11/21 1008   09/11/21 1008  For home use only DME Bedside commode  Once       Question Answer Comment  Patient needs a bedside commode to treat with the following condition Rib fracture   Patient needs a bedside commode to treat with the following condition Fall      09/11/21 1008            Discharge Exam: Filed Weights   09/08/21 0043  Weight: 63 kg   General exam: Awake, laying  in bed, in nad Respiratory system: Normal respiratory effort, no wheezing Cardiovascular system: regular rate, s1, s2 Gastrointestinal system: Soft, nondistended, positive BS Central nervous system: CN2-12 grossly intact, strength intact Extremities: Perfused, no clubbing Skin: Normal skin turgor, no notable skin lesions seen Psychiatry: Mood normal // no visual hallucinations   Condition at discharge: fair  The results of significant diagnostics from this hospitalization (including imaging, microbiology, ancillary and laboratory) are listed below for reference.   Imaging Studies: [place] EKG SITE RITE  Result Date: 09/08/2021 If Site Rite image not attached, placement could not be confirmed due to current cardiac rhythm.  CT CERVICAL SPINE WO CONTRAST  Result Date: 09/08/2021 CLINICAL DATA:  Neck trauma.  Fall. EXAM: CT CERVICAL SPINE WITHOUT CONTRAST TECHNIQUE: Multidetector CT imaging of the cervical spine was performed without intravenous contrast. Multiplanar CT image reconstructions were also generated. RADIATION DOSE REDUCTION: This exam was performed according to the departmental dose-optimization program which includes automated exposure control, adjustment of the mA and/or kV according to patient size and/or use of iterative reconstruction technique. COMPARISON:  08/01/2021 FINDINGS: Alignment: Normal alignment. Skull base and vertebrae: No acute fracture.  No primary bone lesion or focal pathologic process. Soft tissues and spinal canal: No prevertebral fluid or swelling. No visible canal hematoma. Disc levels: Degenerative changes with disc space narrowing and endplate osteophyte formation most prominent at C4-5 and C5-6 levels. Upper chest: Infiltration or edema is suggested in the visualized lung apices. Other: None. IMPRESSION: 1. Normal alignment. 2. No acute displaced fractures are identified. 3. Degenerative changes. 4. Incidental note of infiltration or edema in the lung apices. Electronically Signed   By: Lucienne Capers M.D.   On: 09/08/2021 02:13   CT HEAD WO CONTRAST (5MM)  Result Date: 09/08/2021 CLINICAL DATA:  Head trauma from a fall.  Code sepsis. EXAM: CT HEAD WITHOUT CONTRAST TECHNIQUE: Contiguous axial images were obtained from the base of the skull through the vertex without intravenous contrast. RADIATION DOSE REDUCTION: This exam was performed according to the departmental dose-optimization program which includes automated exposure control, adjustment of the mA and/or kV according to patient size and/or use of iterative reconstruction technique. COMPARISON:  08/01/2021 FINDINGS: Brain: No evidence of acute infarction, hemorrhage, hydrocephalus, extra-axial collection or mass lesion/mass effect. Vascular: No hyperdense vessel or unexpected calcification. Skull: Normal. Negative for fracture or focal lesion. Sinuses/Orbits: Mild mucosal thickening in the paranasal sinuses. No acute air-fluid levels. Mastoid air cells are clear. Other: None. IMPRESSION: No acute intracranial abnormalities. Electronically Signed   By: Lucienne Capers M.D.   On: 09/08/2021 02:10   DG Hip Unilat W or Wo Pelvis 2-3 Views Left  Result Date: 09/08/2021 CLINICAL DATA:  Fall.  Down graded trauma. EXAM: DG HIP (WITH OR WITHOUT PELVIS) 2-3V LEFT COMPARISON:  None Available. FINDINGS: Pelvis and left hip appear intact. No acute displaced fractures are identified. SI  joints and symphysis pubis are nondisplaced. No focal bone lesions. Calcified phleboliths in the pelvis. IMPRESSION: No acute bony abnormalities. Electronically Signed   By: Lucienne Capers M.D.   On: 09/08/2021 01:50   DG Ribs Unilateral W/Chest Left  Result Date: 09/08/2021 CLINICAL DATA:  Fall.  Down graded trauma EXAM: LEFT RIBS AND CHEST - 3+ VIEW COMPARISON:  07/16/2021 FINDINGS: Shallow inspiration. Heart size and pulmonary vascularity are normal. Patchy perihilar infiltration on the right may represent edema or pneumonia. No pleural effusions. No pneumothorax. Mediastinal contours appear intact. Acute nondisplaced fractures are demonstrated in the left anterior seventh, eighth, and ninth  ribs. Surgical clips in the left upper quadrant. IMPRESSION: 1. Patchy perihilar infiltration in the right lung. No pneumothorax. 2. Multiple acute nondisplaced anterior left rib fractures. Electronically Signed   By: Lucienne Capers M.D.   On: 09/08/2021 01:46    Microbiology: Results for orders placed or performed during the hospital encounter of 09/08/21  Resp Panel by RT-PCR (Flu A&B, Covid) Anterior Nasal Swab     Status: None   Collection Time: 09/08/21 12:50 AM   Specimen: Anterior Nasal Swab  Result Value Ref Range Status   SARS Coronavirus 2 by RT PCR NEGATIVE NEGATIVE Final    Comment: (NOTE) SARS-CoV-2 target nucleic acids are NOT DETECTED.  The SARS-CoV-2 RNA is generally detectable in upper respiratory specimens during the acute phase of infection. The lowest concentration of SARS-CoV-2 viral copies this assay can detect is 138 copies/mL. A negative result does not preclude SARS-Cov-2 infection and should not be used as the sole basis for treatment or other patient management decisions. A negative result may occur with  improper specimen collection/handling, submission of specimen other than nasopharyngeal swab, presence of viral mutation(s) within the areas targeted by this assay, and  inadequate number of viral copies(<138 copies/mL). A negative result must be combined with clinical observations, patient history, and epidemiological information. The expected result is Negative.  Fact Sheet for Patients:  EntrepreneurPulse.com.au  Fact Sheet for Healthcare Providers:  IncredibleEmployment.be  This test is no t yet approved or cleared by the [place] FDA and  has been authorized for detection and/or diagnosis of SARS-CoV-2 by FDA under an Emergency Use Authorization (EUA). This EUA will remain  in effect (meaning this test can be used) for the duration of the COVID-19 declaration under Section 564(b)(1) of the Act, 21 U.S.C.section 360bbb-3(b)(1), unless the authorization is terminated  or revoked sooner.       Influenza A by PCR NEGATIVE NEGATIVE Final   Influenza B by PCR NEGATIVE NEGATIVE Final    Comment: (NOTE) The Xpert Xpress SARS-CoV-2/FLU/RSV plus assay is intended as an aid in the diagnosis of influenza from Nasopharyngeal swab specimens and should not be used as a sole basis for treatment. Nasal washings and aspirates are unacceptable for Xpert Xpress SARS-CoV-2/FLU/RSV testing.  Fact Sheet for Patients: EntrepreneurPulse.com.au  Fact Sheet for Healthcare Providers: IncredibleEmployment.be  This test is not yet approved or cleared by the [place] FDA and has been authorized for detection and/or diagnosis of SARS-CoV-2 by FDA under an Emergency Use Authorization (EUA). This EUA will remain in effect (meaning this test can be used) for the duration of the COVID-19 declaration under Section 564(b)(1) of the Act, 21 U.S.C. section 360bbb-3(b)(1), unless the authorization is terminated or revoked.  Performed at [facility], [place] [address], [place], [place] [address]   Blood Culture (routine x 2)     Status: None (Preliminary result)   Collection Time: 09/08/21  12:52 AM   Specimen: BLOOD  Result Value Ref Range Status   Specimen Description BLOOD RIGHT ANTECUBITAL  Final   Special Requests   Final    BOTTLES DRAWN AEROBIC AND ANAEROBIC Blood Culture adequate volume   Culture   Final    NO GROWTH 3 DAYS Performed at [facility], [place] [address], [place], [place] [address]    Report Status PENDING  Incomplete  Urine Culture     Status: Abnormal   Collection Time: 09/08/21 12:55 AM   Specimen: In/Out Cath Urine  Result Value Ref Range Status  Specimen Description IN/OUT CATH URINE  Final   Special Requests   Final    NONE Performed at [facility], [place] [address], [place], [place] [address]    Culture >=100,000 COLONIES/mL ESCHERICHIA COLI (A)  Final   Report Status 09/11/2021 FINAL  Final   Organism ID, Bacteria ESCHERICHIA COLI (A)  Final      Susceptibility   Escherichia coli - MIC*    AMPICILLIN <=2 SENSITIVE Sensitive     CEFAZOLIN <=4 SENSITIVE Sensitive     CEFEPIME <=0.12 SENSITIVE Sensitive     CEFTRIAXONE <=0.25 SENSITIVE Sensitive     CIPROFLOXACIN <=0.25 SENSITIVE Sensitive     GENTAMICIN <=1 SENSITIVE Sensitive     IMIPENEM <=0.25 SENSITIVE Sensitive     NITROFURANTOIN <=16 SENSITIVE Sensitive     TRIMETH/SULFA <=20 SENSITIVE Sensitive     AMPICILLIN/SULBACTAM <=2 SENSITIVE Sensitive     PIP/TAZO <=4 SENSITIVE Sensitive     * >=100,000 COLONIES/mL ESCHERICHIA COLI  Blood Culture (routine x 2)     Status: None (Preliminary result)   Collection Time: 09/08/21  2:50 AM   Specimen: BLOOD  Result Value Ref Range Status   Specimen Description BLOOD LEFT ANTECUBITAL  Final   Special Requests   Final    AEROBIC BOTTLE ONLY Blood Culture results may not be optimal due to an inadequate volume of blood received in culture bottles   Culture   Final    NO GROWTH 3 DAYS Performed at [place] [address], [place], [place] [address]    Report Status PENDING  Incomplete  MRSA Next Gen by PCR, Nasal      Status: None   Collection Time: 09/08/21 10:18 AM   Specimen: Nasal Mucosa; Nasal Swab  Result Value Ref Range Status   MRSA by PCR Next Gen NOT DETECTED NOT DETECTED Final    Comment: (NOTE) The GeneXpert MRSA Assay (FDA approved for NASAL specimens only), is one component of a comprehensive MRSA colonization surveillance program. It is not intended to diagnose MRSA infection nor to guide or monitor treatment for MRSA infections. Test performance is not FDA approved in patients less than 62 years old. Performed at [facility], [place] [address], [place], [place] [address]     Labs: CBC: Recent Labs  Lab 09/08/21 0052 09/08/21 0103 09/08/21 0932 09/09/21 0600 09/10/21 0606 09/11/21 0514  WBC 11.0*  --  10.5 6.5 5.0 4.0  NEUTROABS 8.9*  --   --   --   --   --   HGB 12.7 12.2 11.2* 10.3* 9.5* 9.8*  HCT 39.3 36.0 34.0* 32.8* 28.6* 29.0*  MCV 106.5*  --  103.3* 103.8* 102.5* 101.4*  PLT 206  --  193 173 150 361   Basic Metabolic Panel: Recent Labs  Lab 09/08/21 0052 09/08/21 0103 09/08/21 0932 09/09/21 0600 09/10/21 0606 09/11/21 0514  NA 141 140  --  142 140 140  K 3.3* 3.3*  --  3.2* 3.3* 3.5  CL 108  --   --  115* 116* 115*  CO2 16*  --   --  20* 19* 21*  GLUCOSE 180*  --   --  84 74 81  BUN 12  --   --  10 5* <5*  CREATININE 0.87  --  0.61 0.43* <0.30* 0.40*  CALCIUM 8.8*  --   --  7.8* 6.9* 7.8*  MG  --   --  1.8  --   --   --  Liver Function Tests: Recent Labs  Lab 09/08/21 0052 09/09/21 0600 09/10/21 0606 09/11/21 0514  AST '29 21 17 23  '$ ALT '16 14 13 14  '$ ALKPHOS 58 48 43 41  BILITOT 0.7 1.0 0.7 0.4  PROT 5.3* 4.3* 3.5* 3.9*  ALBUMIN 2.6* 2.0* 1.6* 1.7*   CBG: No results for input(s): "GLUCAP" in the last 168 hours.  Discharge time spent: less than 30 minutes.  Signed: Marylu Lund, MD [facility] 09/11/2021

## 2021-09-13 LAB — CULTURE, BLOOD (ROUTINE X 2)
Culture: NO GROWTH
Culture: NO GROWTH
Special Requests: ADEQUATE

## 2021-11-11 ENCOUNTER — Other Ambulatory Visit: Payer: Self-pay

## 2021-11-11 ENCOUNTER — Emergency Department (HOSPITAL_COMMUNITY): Payer: Medicare Other

## 2021-11-11 ENCOUNTER — Encounter (HOSPITAL_COMMUNITY): Payer: Self-pay

## 2021-11-11 ENCOUNTER — Inpatient Hospital Stay (HOSPITAL_COMMUNITY)
Admission: EM | Admit: 2021-11-11 | Discharge: 2021-12-11 | DRG: 193 | Disposition: E | Payer: Medicare Other | Attending: Internal Medicine | Admitting: Internal Medicine

## 2021-11-11 DIAGNOSIS — R7303 Prediabetes: Secondary | ICD-10-CM | POA: Diagnosis present

## 2021-11-11 DIAGNOSIS — E872 Acidosis, unspecified: Secondary | ICD-10-CM | POA: Diagnosis present

## 2021-11-11 DIAGNOSIS — R112 Nausea with vomiting, unspecified: Secondary | ICD-10-CM | POA: Diagnosis not present

## 2021-11-11 DIAGNOSIS — E876 Hypokalemia: Secondary | ICD-10-CM | POA: Diagnosis present

## 2021-11-11 DIAGNOSIS — J9611 Chronic respiratory failure with hypoxia: Secondary | ICD-10-CM | POA: Diagnosis present

## 2021-11-11 DIAGNOSIS — K760 Fatty (change of) liver, not elsewhere classified: Secondary | ICD-10-CM | POA: Diagnosis present

## 2021-11-11 DIAGNOSIS — J9621 Acute and chronic respiratory failure with hypoxia: Secondary | ICD-10-CM | POA: Diagnosis present

## 2021-11-11 DIAGNOSIS — Z515 Encounter for palliative care: Secondary | ICD-10-CM

## 2021-11-11 DIAGNOSIS — Z7951 Long term (current) use of inhaled steroids: Secondary | ICD-10-CM

## 2021-11-11 DIAGNOSIS — R54 Age-related physical debility: Secondary | ICD-10-CM | POA: Diagnosis present

## 2021-11-11 DIAGNOSIS — I951 Orthostatic hypotension: Secondary | ICD-10-CM | POA: Diagnosis present

## 2021-11-11 DIAGNOSIS — Z87891 Personal history of nicotine dependence: Secondary | ICD-10-CM

## 2021-11-11 DIAGNOSIS — Z9981 Dependence on supplemental oxygen: Secondary | ICD-10-CM

## 2021-11-11 DIAGNOSIS — M810 Age-related osteoporosis without current pathological fracture: Secondary | ICD-10-CM | POA: Diagnosis present

## 2021-11-11 DIAGNOSIS — Z9049 Acquired absence of other specified parts of digestive tract: Secondary | ICD-10-CM

## 2021-11-11 DIAGNOSIS — I5030 Unspecified diastolic (congestive) heart failure: Secondary | ICD-10-CM | POA: Diagnosis present

## 2021-11-11 DIAGNOSIS — K219 Gastro-esophageal reflux disease without esophagitis: Secondary | ICD-10-CM | POA: Diagnosis present

## 2021-11-11 DIAGNOSIS — I2510 Atherosclerotic heart disease of native coronary artery without angina pectoris: Secondary | ICD-10-CM | POA: Diagnosis present

## 2021-11-11 DIAGNOSIS — D649 Anemia, unspecified: Secondary | ICD-10-CM | POA: Diagnosis present

## 2021-11-11 DIAGNOSIS — Z79899 Other long term (current) drug therapy: Secondary | ICD-10-CM

## 2021-11-11 DIAGNOSIS — F419 Anxiety disorder, unspecified: Secondary | ICD-10-CM | POA: Diagnosis present

## 2021-11-11 DIAGNOSIS — J189 Pneumonia, unspecified organism: Principal | ICD-10-CM | POA: Diagnosis present

## 2021-11-11 DIAGNOSIS — J45909 Unspecified asthma, uncomplicated: Secondary | ICD-10-CM | POA: Diagnosis present

## 2021-11-11 DIAGNOSIS — R6521 Severe sepsis with septic shock: Secondary | ICD-10-CM | POA: Diagnosis not present

## 2021-11-11 DIAGNOSIS — Z79818 Long term (current) use of other agents affecting estrogen receptors and estrogen levels: Secondary | ICD-10-CM

## 2021-11-11 DIAGNOSIS — I110 Hypertensive heart disease with heart failure: Secondary | ICD-10-CM | POA: Diagnosis present

## 2021-11-11 DIAGNOSIS — Z888 Allergy status to other drugs, medicaments and biological substances status: Secondary | ICD-10-CM

## 2021-11-11 DIAGNOSIS — M62838 Other muscle spasm: Secondary | ICD-10-CM | POA: Diagnosis present

## 2021-11-11 DIAGNOSIS — K5900 Constipation, unspecified: Secondary | ICD-10-CM | POA: Diagnosis present

## 2021-11-11 DIAGNOSIS — B20 Human immunodeficiency virus [HIV] disease: Secondary | ICD-10-CM | POA: Diagnosis present

## 2021-11-11 DIAGNOSIS — Z20822 Contact with and (suspected) exposure to covid-19: Secondary | ICD-10-CM | POA: Diagnosis present

## 2021-11-11 DIAGNOSIS — Z8249 Family history of ischemic heart disease and other diseases of the circulatory system: Secondary | ICD-10-CM

## 2021-11-11 DIAGNOSIS — Z833 Family history of diabetes mellitus: Secondary | ICD-10-CM

## 2021-11-11 DIAGNOSIS — Z951 Presence of aortocoronary bypass graft: Secondary | ICD-10-CM

## 2021-11-11 DIAGNOSIS — E785 Hyperlipidemia, unspecified: Secondary | ICD-10-CM | POA: Diagnosis present

## 2021-11-11 DIAGNOSIS — K449 Diaphragmatic hernia without obstruction or gangrene: Secondary | ICD-10-CM | POA: Diagnosis present

## 2021-11-11 DIAGNOSIS — J84112 Idiopathic pulmonary fibrosis: Secondary | ICD-10-CM | POA: Diagnosis present

## 2021-11-11 DIAGNOSIS — Z66 Do not resuscitate: Secondary | ICD-10-CM | POA: Diagnosis not present

## 2021-11-11 DIAGNOSIS — E860 Dehydration: Secondary | ICD-10-CM | POA: Diagnosis present

## 2021-11-11 DIAGNOSIS — N390 Urinary tract infection, site not specified: Secondary | ICD-10-CM | POA: Diagnosis present

## 2021-11-11 DIAGNOSIS — Z7982 Long term (current) use of aspirin: Secondary | ICD-10-CM

## 2021-11-11 DIAGNOSIS — R252 Cramp and spasm: Secondary | ICD-10-CM | POA: Diagnosis present

## 2021-11-11 DIAGNOSIS — Z882 Allergy status to sulfonamides status: Secondary | ICD-10-CM

## 2021-11-11 DIAGNOSIS — K222 Esophageal obstruction: Secondary | ICD-10-CM | POA: Diagnosis present

## 2021-11-11 DIAGNOSIS — Z7902 Long term (current) use of antithrombotics/antiplatelets: Secondary | ICD-10-CM

## 2021-11-11 DIAGNOSIS — A419 Sepsis, unspecified organism: Secondary | ICD-10-CM | POA: Clinically undetermined

## 2021-11-11 DIAGNOSIS — Z21 Asymptomatic human immunodeficiency virus [HIV] infection status: Secondary | ICD-10-CM | POA: Diagnosis present

## 2021-11-11 DIAGNOSIS — G5793 Unspecified mononeuropathy of bilateral lower limbs: Secondary | ICD-10-CM | POA: Diagnosis present

## 2021-11-11 DIAGNOSIS — J690 Pneumonitis due to inhalation of food and vomit: Secondary | ICD-10-CM | POA: Diagnosis present

## 2021-11-11 LAB — I-STAT VENOUS BLOOD GAS, ED
Acid-base deficit: 2 mmol/L (ref 0.0–2.0)
Bicarbonate: 24.1 mmol/L (ref 20.0–28.0)
Calcium, Ion: 1.12 mmol/L — ABNORMAL LOW (ref 1.15–1.40)
HCT: 36 % (ref 36.0–46.0)
Hemoglobin: 12.2 g/dL (ref 12.0–15.0)
O2 Saturation: 60 %
Potassium: 2.4 mmol/L — CL (ref 3.5–5.1)
Sodium: 142 mmol/L (ref 135–145)
TCO2: 25 mmol/L (ref 22–32)
pCO2, Ven: 43.5 mmHg — ABNORMAL LOW (ref 44–60)
pH, Ven: 7.351 (ref 7.25–7.43)
pO2, Ven: 33 mmHg (ref 32–45)

## 2021-11-11 LAB — CBC WITH DIFFERENTIAL/PLATELET
Abs Immature Granulocytes: 0.07 10*3/uL (ref 0.00–0.07)
Basophils Absolute: 0 10*3/uL (ref 0.0–0.1)
Basophils Relative: 0 %
Eosinophils Absolute: 0 10*3/uL (ref 0.0–0.5)
Eosinophils Relative: 0 %
HCT: 38.2 % (ref 36.0–46.0)
Hemoglobin: 13.1 g/dL (ref 12.0–15.0)
Immature Granulocytes: 1 %
Lymphocytes Relative: 14 %
Lymphs Abs: 1.2 10*3/uL (ref 0.7–4.0)
MCH: 32.8 pg (ref 26.0–34.0)
MCHC: 34.3 g/dL (ref 30.0–36.0)
MCV: 95.7 fL (ref 80.0–100.0)
Monocytes Absolute: 1.5 10*3/uL — ABNORMAL HIGH (ref 0.1–1.0)
Monocytes Relative: 19 %
Neutro Abs: 5.5 10*3/uL (ref 1.7–7.7)
Neutrophils Relative %: 66 %
Platelets: 260 10*3/uL (ref 150–400)
RBC: 3.99 MIL/uL (ref 3.87–5.11)
RDW: 15.5 % (ref 11.5–15.5)
WBC: 8.3 10*3/uL (ref 4.0–10.5)
nRBC: 0 % (ref 0.0–0.2)

## 2021-11-11 LAB — URINALYSIS, ROUTINE W REFLEX MICROSCOPIC
Bilirubin Urine: NEGATIVE
Glucose, UA: NEGATIVE mg/dL
Hgb urine dipstick: NEGATIVE
Ketones, ur: NEGATIVE mg/dL
Leukocytes,Ua: NEGATIVE
Nitrite: NEGATIVE
Protein, ur: NEGATIVE mg/dL
Specific Gravity, Urine: >1.046 — ABNORMAL HIGH (ref 1.005–1.030)
pH: 6 (ref 5.0–8.0)

## 2021-11-11 LAB — COMPREHENSIVE METABOLIC PANEL
ALT: 42 U/L (ref 0–44)
AST: 96 U/L — ABNORMAL HIGH (ref 15–41)
Albumin: 2.7 g/dL — ABNORMAL LOW (ref 3.5–5.0)
Alkaline Phosphatase: 57 U/L (ref 38–126)
Anion gap: 17 — ABNORMAL HIGH (ref 5–15)
BUN: 11 mg/dL (ref 8–23)
CO2: 22 mmol/L (ref 22–32)
Calcium: 9.5 mg/dL (ref 8.9–10.3)
Chloride: 101 mmol/L (ref 98–111)
Creatinine, Ser: 0.77 mg/dL (ref 0.44–1.00)
GFR, Estimated: >60 mL/min (ref >60)
Glucose, Bld: 100 mg/dL — ABNORMAL HIGH (ref 70–99)
Potassium: 3.3 mmol/L — ABNORMAL LOW (ref 3.5–5.1)
Sodium: 140 mmol/L (ref 135–145)
Total Bilirubin: 1.3 mg/dL — ABNORMAL HIGH (ref 0.3–1.2)
Total Protein: 5.9 g/dL — ABNORMAL LOW (ref 6.5–8.1)

## 2021-11-11 LAB — LACTIC ACID, PLASMA
Lactic Acid, Venous: 2.8 mmol/L (ref 0.5–1.9)
Lactic Acid, Venous: 1.6 mmol/L (ref 0.5–1.9)

## 2021-11-11 LAB — CBG MONITORING, ED: Glucose-Capillary: 118 mg/dL — ABNORMAL HIGH (ref 70–99)

## 2021-11-11 LAB — MAGNESIUM: Magnesium: 2 mg/dL (ref 1.7–2.4)

## 2021-11-11 LAB — LIPASE, BLOOD: Lipase: 22 U/L (ref 11–51)

## 2021-11-11 LAB — SARS CORONAVIRUS 2 BY RT PCR: SARS Coronavirus 2 by RT PCR: NEGATIVE

## 2021-11-11 MED ORDER — SENNOSIDES-DOCUSATE SODIUM 8.6-50 MG PO TABS
1.0000 | ORAL_TABLET | Freq: Two times a day (BID) | ORAL | Status: DC
Start: 2021-11-11 — End: 2021-11-17
  Administered 2021-11-11 – 2021-11-16 (×5): 1 via ORAL
  Filled 2021-11-11 (×7): qty 1

## 2021-11-11 MED ORDER — MORPHINE SULFATE (PF) 2 MG/ML IV SOLN
2.0000 mg | Freq: Once | INTRAVENOUS | Status: AC
  Administered 2021-11-11: 2 mg via INTRAVENOUS
  Filled 2021-11-11: qty 1

## 2021-11-11 MED ORDER — ONDANSETRON HCL 4 MG/2ML IJ SOLN
4.0000 mg | Freq: Four times a day (QID) | INTRAMUSCULAR | Status: DC | PRN
  Administered 2021-11-11 – 2021-11-16 (×5): 4 mg via INTRAVENOUS
  Filled 2021-11-11 (×5): qty 2

## 2021-11-11 MED ORDER — ASPIRIN 81 MG PO TBEC
81.0000 mg | DELAYED_RELEASE_TABLET | Freq: Every day | ORAL | Status: DC
  Administered 2021-11-13 – 2021-11-16 (×4): 81 mg via ORAL
  Filled 2021-11-11 (×5): qty 1

## 2021-11-11 MED ORDER — ONDANSETRON 4 MG PO TBDP
4.0000 mg | ORAL_TABLET | Freq: Once | ORAL | Status: AC
  Administered 2021-11-11: 4 mg via ORAL
  Filled 2021-11-11: qty 1

## 2021-11-11 MED ORDER — DOLUTEGRAVIR-LAMIVUDINE 50-300 MG PO TABS
1.0000 | ORAL_TABLET | Freq: Every day | ORAL | Status: DC
  Administered 2021-11-12 – 2021-11-16 (×5): 1 via ORAL
  Filled 2021-11-11 (×8): qty 1

## 2021-11-11 MED ORDER — RANOLAZINE ER 500 MG PO TB12
500.0000 mg | ORAL_TABLET | Freq: Two times a day (BID) | ORAL | Status: DC
  Administered 2021-11-11 – 2021-11-16 (×5): 500 mg via ORAL
  Filled 2021-11-11 (×13): qty 1

## 2021-11-11 MED ORDER — ONDANSETRON HCL 4 MG/2ML IJ SOLN: 4.0000 mg | Freq: Once | INTRAMUSCULAR | Status: DC

## 2021-11-11 MED ORDER — CLOPIDOGREL BISULFATE 75 MG PO TABS
75.0000 mg | ORAL_TABLET | Freq: Every day | ORAL | Status: DC
  Administered 2021-11-13 – 2021-11-16 (×2): 75 mg via ORAL
  Filled 2021-11-11 (×4): qty 1

## 2021-11-11 MED ORDER — LACTATED RINGERS IV SOLN: INTRAVENOUS | Status: AC

## 2021-11-11 MED ORDER — SODIUM CHLORIDE 0.9 % IV BOLUS
1000.0000 mL | Freq: Once | INTRAVENOUS | Status: AC
  Administered 2021-11-11: 1000 mL via INTRAVENOUS

## 2021-11-11 MED ORDER — ALBUTEROL SULFATE (2.5 MG/3ML) 0.083% IN NEBU
2.5000 mg | INHALATION_SOLUTION | Freq: Four times a day (QID) | RESPIRATORY_TRACT | Status: DC | PRN
Start: 2021-11-11 — End: 2021-11-17

## 2021-11-11 MED ORDER — ATORVASTATIN CALCIUM 40 MG PO TABS
80.0000 mg | ORAL_TABLET | Freq: Every evening | ORAL | Status: DC
  Administered 2021-11-13 – 2021-11-16 (×4): 80 mg via ORAL
  Filled 2021-11-11: qty 1
  Filled 2021-11-11: qty 2
  Filled 2021-11-11 (×3): qty 1

## 2021-11-11 MED ORDER — ALBUTEROL SULFATE HFA 108 (90 BASE) MCG/ACT IN AERS: 1.0000 | INHALATION_SPRAY | Freq: Four times a day (QID) | RESPIRATORY_TRACT | Status: DC | PRN

## 2021-11-11 MED ORDER — IOHEXOL 300 MG/ML  SOLN
100.0000 mL | Freq: Once | INTRAMUSCULAR | Status: AC | PRN
  Administered 2021-11-11: 100 mL via INTRAVENOUS

## 2021-11-11 MED ORDER — BISACODYL 10 MG RE SUPP: 10.0000 mg | Freq: Every day | RECTAL | Status: DC | PRN

## 2021-11-11 MED ORDER — ONDANSETRON HCL 4 MG PO TABS
4.0000 mg | ORAL_TABLET | Freq: Four times a day (QID) | ORAL | Status: DC | PRN
  Administered 2021-11-12: 4 mg via ORAL
  Filled 2021-11-11: qty 1

## 2021-11-11 MED ORDER — MIDODRINE HCL 5 MG PO TABS
10.0000 mg | ORAL_TABLET | Freq: Two times a day (BID) | ORAL | Status: DC
  Administered 2021-11-14 – 2021-11-16 (×6): 10 mg via ORAL
  Filled 2021-11-11 (×8): qty 2

## 2021-11-11 MED ORDER — SODIUM CHLORIDE 0.9 % IV BOLUS
1000.0000 mL | Freq: Once | INTRAVENOUS | Status: AC
Start: 2021-11-11 — End: 2021-11-11
  Administered 2021-11-11: 1000 mL via INTRAVENOUS

## 2021-11-11 MED ORDER — PANTOPRAZOLE SODIUM 40 MG PO TBEC
40.0000 mg | DELAYED_RELEASE_TABLET | Freq: Every day | ORAL | Status: DC
  Administered 2021-11-11 – 2021-11-16 (×5): 40 mg via ORAL
  Filled 2021-11-11 (×6): qty 1

## 2021-11-11 MED ORDER — POLYETHYLENE GLYCOL 3350 17 G PO PACK
17.0000 g | PACK | Freq: Two times a day (BID) | ORAL | Status: DC
  Administered 2021-11-11 – 2021-11-16 (×6): 17 g via ORAL
  Filled 2021-11-11 (×7): qty 1

## 2021-11-11 MED ORDER — ACETAMINOPHEN 325 MG PO TABS
650.0000 mg | ORAL_TABLET | Freq: Four times a day (QID) | ORAL | Status: DC | PRN
  Administered 2021-11-11 – 2021-11-15 (×4): 650 mg via ORAL
  Filled 2021-11-11 (×5): qty 2

## 2021-11-11 MED ORDER — TRAZODONE HCL 50 MG PO TABS
50.0000 mg | ORAL_TABLET | Freq: Every day | ORAL | Status: DC
  Administered 2021-11-11 – 2021-11-13 (×2): 50 mg via ORAL
  Filled 2021-11-11 (×3): qty 1

## 2021-11-11 MED ORDER — METOCLOPRAMIDE HCL 5 MG/ML IJ SOLN
10.0000 mg | Freq: Three times a day (TID) | INTRAMUSCULAR | Status: DC
  Administered 2021-11-11 – 2021-11-12 (×2): 10 mg via INTRAVENOUS
  Filled 2021-11-11 (×5): qty 2

## 2021-11-11 MED ORDER — FLUTICASONE FUROATE-VILANTEROL 100-25 MCG/ACT IN AEPB
1.0000 | INHALATION_SPRAY | Freq: Every day | RESPIRATORY_TRACT | Status: DC
Start: 2021-11-12 — End: 2021-11-17
  Administered 2021-11-12 – 2021-11-16 (×4): 1 via RESPIRATORY_TRACT
  Filled 2021-11-11: qty 28

## 2021-11-11 MED ORDER — ACETAMINOPHEN 650 MG RE SUPP: 650.0000 mg | Freq: Four times a day (QID) | RECTAL | Status: DC | PRN

## 2021-11-11 MED ORDER — ENOXAPARIN SODIUM 40 MG/0.4ML IJ SOSY
40.0000 mg | PREFILLED_SYRINGE | INTRAMUSCULAR | Status: DC
  Administered 2021-11-12 – 2021-11-16 (×5): 40 mg via SUBCUTANEOUS
  Filled 2021-11-11 (×5): qty 0.4

## 2021-11-11 NOTE — ED Notes (Signed)
Patient to CT scan

## 2021-11-11 NOTE — H&P (Signed)
History and Physical    [name] [name] GHW:[id number] DOB: May 03, 1950 DOA: 12/03/2021  PCP: Pcp, No  Patient coming from: Home  I have personally briefly reviewed patient's old medical records in [place]  Chief Complaint: Nausea and vomiting  HPI: [name] [name] is a 71 y.o. female with medical history significant for pulmonary fibrosis on 2 L O2 prn qhs, asthma, CAD, HIV, HLD, orthostatic hypotension who presented to the ED for evaluation of nausea and vomiting.  Patient reports 5 days of persistent nausea and vomiting.  She has not been able to maintain any adequate oral intake.  She has felt dehydrated.  She has had some associated abdominal pain.  She denies any diarrhea, chest pain, dyspnea, dysuria.  She reports a history of gastric resection due to polyps.  ED Course  Labs/Imaging on admission: I have personally reviewed following labs and imaging studies.  Initial vitals showed BP 124/78, pulse 104, RR 18, temp 97.8 F, SPO2 98% on room air.  Labs show WBC 8.3, hemoglobin 13.1, platelets 15.5, sodium 140 potassium 3.3 and AST 96 (may be affected by hemolysis), BUN 11, creatinine 0.77, serum glucose 100, lipase 22, magnesium 2.0, lactic acid 2.8 > 1.6.  SARS-CoV-2 PCR negative.  Portable chest x-ray negative for focal consolidation, edema, effusion.  CT abdomen/pelvis with contrast showed increased stool in rectum and proximal half of the colon.  Prior gastric resection with moderate to large hiatal hernia seen.  Fatty infiltration of liver and new cortical scar in upper pole right kidney seen.  No acute intra-abdominal or intrapelvic abnormalities.  Patient was given 1 L normal saline, Zofran, morphine.  Fecal disimpaction performed by EDP.  The hospitalist service was consulted to admit for further evaluation and management.  Review of Systems: All systems reviewed and are negative except as documented in history of present illness above.   Past Medical History:   Diagnosis Date   Asthma    Asthma exacerbation 04/21/2018   Bradycardia    Sinus bradycardia   CAD (coronary artery disease)    Minimal by history, Question coronary artery spasm   Closed fracture of sacrum (HCC) 12/23/2010   Colon polyps    Dyslipidemia    GERD (gastroesophageal reflux disease)    Esophageal stricture   H/O: hysterectomy    Hiatal hernia    HIV positive ([place])    Needle stick 1993   Hypertension    Influenza due to identified novel influenza A virus with other respiratory manifestations 04/22/2018   Palpitations    Peptic ulcer disease    Pulmonary fibrosis ([place])    on 2L home O2, prn at night   Shortness of breath    Extensive evaluation Dr. Winona Legato, [facility], May, 7893, etiology uncertain, probable bronchiolitis possibility of lung biopsy at that time   Weight loss     Past Surgical History:  Procedure Laterality Date   ABDOMINAL HYSTERECTOMY     APPENDECTOMY     CARDIAC CATHETERIZATION N/A 03/21/2016   Procedure: Right/Left Heart Cath and Coronary Angiography;  Surgeon: Belva Crome, MD;  Location: [place] [facility];  Service: Cardiovascular;  Laterality: N/A;   CHOLECYSTECTOMY     CORONARY STENT PLACEMENT  12/2017   GASTRECTOMY     HC OCCIPITAL NERVE BLOCK  08/01/2021   VENTRAL HERNIA REPAIR      Social History:  reports that she quit smoking about 25 years ago. Her smoking use included cigarettes. She has never used smokeless tobacco. She  reports that she does not drink alcohol and does not use drugs.  Allergies  Allergen Reactions   Isosorbide Nitrate Other (See Comments)    Headaches   Bactrim [Sulfamethoxazole-Trimethoprim] Rash   Sulfamethoxazole-Trimethoprim Hives and Rash    Family History  Problem Relation Age of Onset   Coronary artery disease Other        family hx of on mother, father and siblings   Bradycardia Sister    Diabetes Mother    Heart attack Mother    Heart disease Mother    Hypertension Mother    Stroke Mother     Bradycardia Brother      Prior to Admission medications   Medication Sig Start Date End Date Taking? Authorizing Provider  acetaminophen (TYLENOL) 500 MG tablet Take 1,000 mg by mouth every 6 (six) hours as needed for mild pain.    [provider]  albuterol (VENTOLIN HFA) 108 (90 Base) MCG/ACT inhaler Inhale 1-2 puffs into the lungs every 6 (six) hours as needed for wheezing or shortness of breath.    [provider]  albuterol (VENTOLIN HFA) 108 (90 Base) MCG/ACT inhaler Inhale 1-2 puffs into the lungs every 6 (six) hours as needed for shortness of breath. 08/21/21   [provider]  aspirin EC 81 MG tablet Take 1 tablet (81 mg total) by mouth daily. 03/17/16   Dorothy Spark, MD  atorvastatin (LIPITOR) 80 MG tablet Take 80 mg by mouth daily at 6 PM.    [provider]  atorvastatin (LIPITOR) 80 MG tablet Take 80 mg by mouth every evening. 08/17/21   [provider]  BREO ELLIPTA 100-25 MCG/ACT AEPB Inhale 1 puff into the lungs daily. 08/21/21   [provider]  budesonide-formoterol (SYMBICORT) 160-4.5 MCG/ACT inhaler Inhale 2 puffs into the lungs 2 (two) times daily.     [provider]  clopidogrel (PLAVIX) 75 MG tablet Take 75 mg by mouth every evening.    [provider]  clopidogrel (PLAVIX) 75 MG tablet Take 75 mg by mouth daily. 08/21/21   [provider]  conjugated estrogens (PREMARIN) vaginal cream Place 4.33 Applicatorfuls vaginally every Wednesday. Pt only to use 1/4 of the applicator     [provider]  [employer] D3 25 MCG (1000 UT) capsule Take 1,000 Units by mouth daily. 04/07/21   [provider]  diclofenac Sodium (VOLTAREN) 1 % GEL Apply 2-4 g topically 2 (two) times daily as needed (joint pain). 05/18/21   [provider]  diclofenac Sodium (VOLTAREN) 1 % GEL Apply 2-3 g topically 3 (three) times daily as needed for pain. 07/29/21   [provider]  docusate sodium  (COLACE) 100 MG capsule Take 1 capsule (100 mg total) by mouth 2 (two) times daily. 09/11/21   Donne Hazel, MD  dolutegravir-lamiVUDine (DOVATO) 50-300 MG tablet Take 1 tablet by mouth daily.    [provider]  DOVATO 50-300 MG tablet Take 1 tablet by mouth daily. 08/15/21   [provider]  furosemide (LASIX) 20 MG tablet Take 20 mg by mouth daily as needed for fluid. 06/01/21   [provider]  furosemide (LASIX) 20 MG tablet Take 20 mg by mouth daily as needed for edema. 07/20/21   [provider]  gabapentin (NEURONTIN) 300 MG capsule Take 1 capsule (300 mg total) by mouth 2 (two) times daily. 09/11/21 10/11/21  Donne Hazel, MD  lidocaine (LIDODERM) 5 % Place 1 patch onto the skin daily. 07/28/21  [provider]  methocarbamol (ROBAXIN) 500 MG tablet Take 500 mg by mouth every 6 (six) hours as needed for muscle spasms. 08/21/21   [provider]  midodrine (PROAMATINE) 10 MG tablet Take 1 tablet (10 mg total) by mouth 2 (two) times daily with a meal. 06/17/21   Domenic Polite, MD  midodrine (PROAMATINE) 10 MG tablet Take 10 mg by mouth 2 (two) times daily. 07/20/21   [provider]  nitroGLYCERIN (NITROSTAT) 0.4 MG SL tablet Place 1 tablet (0.4 mg total) under the tongue every 5 (five) minutes as needed for chest pain. 03/21/16   Belva Crome, MD  nitroGLYCERIN (NITROSTAT) 0.4 MG SL tablet Place 0.4 mg under the tongue every 5 (five) minutes as needed for chest pain. 07/20/21   [provider]  ondansetron (ZOFRAN) 4 MG tablet Take 1 tablet (4 mg total) by mouth every 8 (eight) hours as needed for nausea or vomiting. 06/17/21   Domenic Polite, MD  ondansetron (ZOFRAN) 4 MG tablet Take 4 mg by mouth every 8 (eight) hours as needed for nausea/vomiting. 08/21/21   [provider]  pantoprazole (PROTONIX) 40 MG tablet Take 40 mg by mouth daily. 05/24/21   [provider]  pantoprazole (PROTONIX) 40 MG tablet Take 40 mg  by mouth daily. 08/23/21   [provider]  polyethylene glycol (MIRALAX / GLYCOLAX) 17 g packet Take 17 g by mouth 2 (two) times daily. 07/20/21   Patrecia Pour, MD  prochlorperazine (COMPAZINE) 5 MG tablet Take 1 tablet (5 mg total) by mouth every 6 (six) hours as needed for nausea or vomiting. 09/11/21   Donne Hazel, MD  ranolazine (RANEXA) 500 MG 12 hr tablet Take 500 mg by mouth 2 (two) times daily. 05/16/21   [provider]  ranolazine (RANEXA) 500 MG 12 hr tablet Take 500 mg by mouth 2 (two) times daily. 08/17/21   [provider]  senna-docusate (SENOKOT-S) 8.6-50 MG tablet Take 1 tablet by mouth 2 (two) times daily. Patient not taking: Reported on 08/01/2021 07/20/21   Patrecia Pour, MD  traMADol (ULTRAM) 50 MG tablet Take 1 tablet (50 mg total) by mouth every 6 (six) hours as needed for moderate pain. 09/11/21   Donne Hazel, MD  traZODone (DESYREL) 50 MG tablet Take 50 mg by mouth at bedtime. 05/04/21   [provider]  traZODone (DESYREL) 50 MG tablet Take 50 mg by mouth at bedtime. 07/25/21   [provider]  trospium (SANCTURA) 20 MG tablet Take 20 mg by mouth 2 (two) times daily. 07/20/21   [provider]    Physical Exam: Vitals:   11/22/2021 1830 12/01/2021 1919 12/08/2021 2007 11/22/2021 2046  BP: (!) 148/88  134/73 119/74  Pulse: (!) 101  97 90  Resp: '14  15 17  '$ Temp:  97.6 F (36.4 C) 97.7 F (36.5 C) 97.7 F (36.5 C)  TempSrc:  Oral Oral Oral  SpO2: 97%  96% 99%  Weight:      Height:       Constitutional: Chronically ill-appearing elderly woman resting in bed, NAD, calm Eyes: EOMI, lids and conjunctivae normal ENMT: Mucous membranes are dry. Posterior pharynx clear of any exudate or lesions. Neck: normal, supple, no masses. Respiratory: clear to auscultation bilaterally, no wheezing, no crackles. Normal respiratory effort. No accessory muscle use.  Cardiovascular: Regular rate and rhythm, no murmurs / rubs / gallops. No  extremity edema. 2+ pedal pulses. Abdomen: no tenderness, no masses palpated.  Bowel sounds present. Musculoskeletal: no clubbing / cyanosis. Good ROM, no contractures. Normal muscle tone.  Skin: no rashes, lesions, ulcers. No induration Neurologic: Sensation intact. Strength equal bilaterally. Psychiatric: Alert and oriented x 3.  EKG: Personally reviewed. Sinus tachycardia, rate 107, motion artifact throughout.  Assessment/Plan Principal Problem:   Nausea and vomiting Active Problems:   Constipation   Chronic respiratory failure with hypoxia (HCC) pulmonary fibrosis asthma   CAD (coronary artery disease)   HIV (human immunodeficiency virus infection) ([place])   [name] [name] is a 71 y.o. female with medical history significant for pulmonary fibrosis on 2 L O2 prn qhs, asthma, CAD, HIV, HLD, orthostatic hypotension who is admitted with dehydration due to nausea and vomiting.  Assessment and Plan: * Nausea and vomiting Persistent nausea and vomiting for 5 days.  History of prior gastric resection.  CT A/P without acute abnormality.  Dehydrated on admission. -Advance diet as tolerated -Continue IV fluid hydration overnight -Start on scheduled Reglan with Zofran as needed  Constipation Disimpaction performed by EDP.  Start on scheduled Senokot/MiraLAX.  Chronic respiratory failure with hypoxia (HCC) pulmonary fibrosis asthma Stable.  Uses 2 L O2 via [place] qhs only as needed. -Continue Breo, albuterol as needed, supplemental O2 as needed  HIV (human immunodeficiency virus infection) ([place]) Continue Dovato.  CAD (coronary artery disease) Stable, denies any chest pain.  Continue aspirin, Plavix, atorvastatin, Ranexa.  DVT prophylaxis: enoxaparin (LOVENOX) injection 40 mg Start: 11/12/21 1000 Code Status: Full code, confirmed with patient on admission Family Communication: Discussed with patient, she has discussed with family Disposition Plan: From home and likely discharge to  home pending clinical progress Consults called: None Severity of Illness: The appropriate patient status for this patient is OBSERVATION. Observation status is judged to be reasonable and necessary in order to provide the required intensity of service to ensure the patient's safety. The patient's presenting symptoms, physical exam findings, and initial radiographic and laboratory data in the context of their medical condition is felt to place them at decreased risk for further clinical deterioration. Furthermore, it is anticipated that the patient will be medically stable for discharge from the hospital within 2 midnights of admission.   Zada Finders MD [facility]  If 7PM-7AM, please contact night-coverage [email or web address]  12/05/2021, 9:16 PM

## 2021-11-11 NOTE — Assessment & Plan Note (Signed)
Persistent nausea and vomiting for 5 days.  History of prior gastric resection.  CT A/P without acute abnormality.  Dehydrated on admission. -Advance diet as tolerated -Continue IV fluid hydration overnight -Start on scheduled Reglan with Zofran as needed

## 2021-11-11 NOTE — Assessment & Plan Note (Signed)
Disimpaction performed by EDP.  Start on scheduled Senokot/MiraLAX.

## 2021-11-11 NOTE — ED Notes (Signed)
ED TO INPATIENT HANDOFF REPORT  ED Nurse Name and Phone #: Caryl Pina RN [phone]  S Name/Age/Gender [name] [name] 71 y.o. female Room/Bed: 002C/002C  Code Status   Code Status: Prior  Home/SNF/Other Home Patient oriented to: self, place, time, and situation Is this baseline? Yes   Triage Complete: Triage complete  Chief Complaint Nausea and vomiting [R11.2]  Triage Note Patient is c/o vomiting for 6 days, with decreased p o intake, fevers and increased weakness. Denies urinary or respiratory symptoms.   Allergies Allergies  Allergen Reactions   Isosorbide Nitrate Other (See Comments)    Headaches   Bactrim [Sulfamethoxazole-Trimethoprim] Rash   Sulfamethoxazole-Trimethoprim Hives and Rash    Level of Care/Admitting Diagnosis ED Disposition     ED Disposition  Admit   Condition  --   Comment  Hospital Area: [facility] [100100]  Level of Care: Med-Surg [16]  May place patient in observation at [facility] or [facility] if equivalent level of care is available:: No  Covid Evaluation: Asymptomatic - no recent exposure (last 10 days) testing not required  Diagnosis: Nausea and vomiting [744752]  Admitting Physician: Lenore Cordia [[id number]]  Attending Physician: Lenore Cordia [[id number]]          B Medical/Surgery History Past Medical History:  Diagnosis Date   Asthma    Asthma exacerbation 04/21/2018   Bradycardia    Sinus bradycardia   CAD (coronary artery disease)    Minimal by history, Question coronary artery spasm   Closed fracture of sacrum ([place]) 12/23/2010   Colon polyps    Dyslipidemia    GERD (gastroesophageal reflux disease)    Esophageal stricture   H/O: hysterectomy    Hiatal hernia    HIV positive ([place])    Needle stick 1993   Hypertension    Influenza due to identified novel influenza A virus with other respiratory manifestations 04/22/2018   Palpitations    Peptic ulcer disease    Pulmonary fibrosis ([place])    on  2L home O2, prn at night   Shortness of breath    Extensive evaluation Dr. Winona Legato, [facility], May, 6295, etiology uncertain, probable bronchiolitis possibility of lung biopsy at that time   Weight loss    Past Surgical History:  Procedure Laterality Date   ABDOMINAL HYSTERECTOMY     APPENDECTOMY     CARDIAC CATHETERIZATION N/A 03/21/2016   Procedure: Right/Left Heart Cath and Coronary Angiography;  Surgeon: Belva Crome, MD;  Location: [facility];  Service: Cardiovascular;  Laterality: N/A;   CHOLECYSTECTOMY     CORONARY STENT PLACEMENT  12/2017   GASTRECTOMY     HC OCCIPITAL NERVE BLOCK  08/01/2021   VENTRAL HERNIA REPAIR       A IV Location/Drains/Wounds Patient Lines/Drains/Airways Status     Active Line/Drains/Airways     Name Placement date Placement time Site Days   Peripheral IV 12/07/2021 22 G 2.5" Anterior;Right;Upper Arm 11/30/2021  1437  Arm  less than 1            Intake/Output Last 24 hours No intake or output data in the 24 hours ending 12/04/2021 1906  Labs/Imaging Results for orders placed or performed during the hospital encounter of 11/15/2021 (from the past 48 hour(s))  CBG monitoring, ED     Status: Abnormal   Collection Time: 11/19/2021 11:50 AM  Result Value Ref Range   Glucose-Capillary 118 (H) 70 - 99 mg/dL    Comment: Glucose reference range  applies only to samples taken after fasting for at least 8 hours.  SARS Coronavirus 2 by RT PCR (hospital order, performed in [facility] hospital lab) *cepheid single result test* Anterior Nasal Swab     Status: None   Collection Time: 11/14/2021 12:06 PM   Specimen: Anterior Nasal Swab  Result Value Ref Range   SARS Coronavirus 2 by RT PCR NEGATIVE NEGATIVE    Comment: (NOTE) SARS-CoV-2 target nucleic acids are NOT DETECTED.  The SARS-CoV-2 RNA is generally detectable in upper and lower respiratory specimens during the acute phase of infection. The lowest concentration of SARS-CoV-2 viral copies this  assay can detect is 250 copies / mL. A negative result does not preclude SARS-CoV-2 infection and should not be used as the sole basis for treatment or other patient management decisions.  A negative result may occur with improper specimen collection / handling, submission of specimen other than nasopharyngeal swab, presence of viral mutation(s) within the areas targeted by this assay, and inadequate number of viral copies (<250 copies / mL). A negative result must be combined with clinical observations, patient history, and epidemiological information.  Fact Sheet for Patients:   https://www.patel.info/  Fact Sheet for Healthcare Providers: https://hall.com/  This test is not yet approved or  cleared by the [place] FDA and has been authorized for detection and/or diagnosis of SARS-CoV-2 by FDA under an Emergency Use Authorization (EUA).  This EUA will remain in effect (meaning this test can be used) for the duration of the COVID-19 declaration under Section 564(b)(1) of the Act, 21 U.S.C. section 360bbb-3(b)(1), unless the authorization is terminated or revoked sooner.  Performed at [facility], [place] [address], [place], [place] [address]   Lactic acid, plasma     Status: Abnormal   Collection Time: 11/20/2021  2:03 PM  Result Value Ref Range   Lactic Acid, Venous 2.8 (HH) 0.5 - 1.9 mmol/L    Comment: CRITICAL RESULT CALLED TO, READ BACK BY AND VERIFIED WITH A.Seerat Peaden,RN '@1558'$  12/06/2021 VANG.J Performed at [facility], [place] [address] [place], [place] [address]   Comprehensive metabolic panel     Status: Abnormal   Collection Time: 12/04/2021  3:00 PM  Result Value Ref Range   Sodium 140 135 - 145 mmol/L   Potassium 3.3 (L) 3.5 - 5.1 mmol/L    Comment: HEMOLYSIS AT THIS LEVEL MAY AFFECT RESULT   Chloride 101 98 - 111 mmol/L   CO2 22 22 - 32 mmol/L   Glucose, Bld 100 (H) 70 - 99 mg/dL    Comment: Glucose reference  range applies only to samples taken after fasting for at least 8 hours.   BUN 11 8 - 23 mg/dL   Creatinine, Ser 0.77 0.44 - 1.00 mg/dL   Calcium 9.5 8.9 - 10.3 mg/dL   Total Protein 5.9 (L) 6.5 - 8.1 g/dL   Albumin 2.7 (L) 3.5 - 5.0 g/dL   AST 96 (H) 15 - 41 U/L    Comment: HEMOLYSIS AT THIS LEVEL MAY AFFECT RESULT   ALT 42 0 - 44 U/L    Comment: HEMOLYSIS AT THIS LEVEL MAY AFFECT RESULT   Alkaline Phosphatase 57 38 - 126 U/L   Total Bilirubin 1.3 (H) 0.3 - 1.2 mg/dL    Comment: HEMOLYSIS AT THIS LEVEL MAY AFFECT RESULT   GFR, Estimated >60 >60 mL/min    Comment: (NOTE) Calculated using the CKD-EPI Creatinine Equation (2021)    Anion gap 17 (H) 5 - 15  Comment: Performed at [facility], [place] [address] [place], [place] [address]  Lipase, blood     Status: None   Collection Time: 11/16/2021  3:00 PM  Result Value Ref Range   Lipase 22 11 - 51 U/L    Comment: Performed at [facility] [address], [place], [place] [address]  Magnesium     Status: None   Collection Time: 11/23/2021  3:00 PM  Result Value Ref Range   Magnesium 2.0 1.7 - 2.4 mg/dL    Comment: Performed at [facility], [place] [address], [place], [place] [address]  CBC with Differential     Status: Abnormal   Collection Time: 11/13/2021  3:29 PM  Result Value Ref Range   WBC 8.3 4.0 - 10.5 K/uL   RBC 3.99 3.87 - 5.11 MIL/uL   Hemoglobin 13.1 12.0 - 15.0 g/dL   HCT 38.2 36.0 - 46.0 %   MCV 95.7 80.0 - 100.0 fL   MCH 32.8 26.0 - 34.0 pg   MCHC 34.3 30.0 - 36.0 g/dL   RDW 15.5 11.5 - 15.5 %   Platelets 260 150 - 400 K/uL   nRBC 0.0 0.0 - 0.2 %   Neutrophils Relative % 66 %   Neutro Abs 5.5 1.7 - 7.7 K/uL   Lymphocytes Relative 14 %   Lymphs Abs 1.2 0.7 - 4.0 K/uL   Monocytes Relative 19 %   Monocytes Absolute 1.5 (H) 0.1 - 1.0 K/uL   Eosinophils Relative 0 %   Eosinophils Absolute 0.0 0.0 - 0.5 K/uL   Basophils Relative 0 %   Basophils Absolute 0.0 0.0 - 0.1 K/uL   Immature Granulocytes 1 %    Abs Immature Granulocytes 0.07 0.00 - 0.07 K/uL    Comment: Performed at [facility] [address] [place], [place] [address]  Lactic acid, plasma     Status: None   Collection Time: 12/04/2021  5:02 PM  Result Value Ref Range   Lactic Acid, Venous 1.6 0.5 - 1.9 mmol/L    Comment: Performed at [facility] [address], [place], [place] [address]   CT ABDOMEN PELVIS W CONTRAST  Result Date: 12/03/2021 CLINICAL DATA:  Abdominal pain, vomiting for 6 days, fever, decreased p.o. intake, increased weakness; history pulmonary fibrosis, GERD, coronary artery disease, asthma, HIV EXAM: CT ABDOMEN AND PELVIS WITH CONTRAST TECHNIQUE: Multidetector CT imaging of the abdomen and pelvis was performed using the standard protocol following bolus administration of intravenous contrast. RADIATION DOSE REDUCTION: This exam was performed according to the departmental dose-optimization program which includes automated exposure control, adjustment of the mA and/or kV according to patient size and/or use of iterative reconstruction technique. CONTRAST:  152m OMNIPAQUE IOHEXOL 300 MG/ML SOLN IV. No oral contrast. COMPARISON:  07/18/2021 FINDINGS: Lower chest: Bibasilar pulmonary fibrosis.  No acute infiltrate. Hepatobiliary: Gallbladder surgically absent. Fatty infiltration of liver. No focal hepatic mass. Pancreas: Atrophic pancreas Spleen: Normal appearance Adrenals/Urinary Tract: Adrenal glands normal appearance. Cortical scar at upper pole RIGHT kidney image 22 new since 07/18/2021. Kidneys, ureters, and bladder otherwise normal appearance. No urinary tract calcification or dilatation. Stomach/Bowel: Increased stool in rectum and in proximal half of colon. Appendix surgically absent by history. Prior gastric resection with moderate to large hiatal hernia. Small bowel gas pattern normal. No evidence of bowel wall thickening or obstruction. Vascular/Lymphatic: Atherosclerotic calcifications aorta and iliac  arteries. Aorta normal caliber. No adenopathy. Reproductive: Uterus surgically absent. Nonvisualization of ovaries.  Other: No free air or free fluid. No hernia or inflammatory process. Musculoskeletal: Bones demineralized. IMPRESSION: Increased stool in rectum and proximal half of colon. Prior gastric resection with moderate to large hiatal hernia. Fatty infiltration of liver. New cortical scar at upper pole RIGHT kidney. No acute intra-abdominal or intrapelvic abnormalities. Aortic Atherosclerosis (ICD10-I70.0). Electronically Signed   By: Lavonia Dana M.D.   On: 12/03/2021 15:10   DG Chest Portable 1 View  Result Date: 11/26/2021 CLINICAL DATA:  Sob weak EXAM: PORTABLE CHEST - 1 VIEW COMPARISON:  09/08/2021 FINDINGS: Relatively low lung volumes. Coarse chronic appearing peripheral interstitial markings. Perihilar opacity seen previously have improved. Heart size and mediastinal contours are within normal limits. Coronary stent. No effusion. Surgical clips in the upper abdomen.  Regional bones unremarkable. IMPRESSION: No acute cardiopulmonary disease. Electronically Signed   By: Lucrezia Europe M.D.   On: 11/12/2021 13:04    Pending Labs Unresulted Labs (From admission, onward)     Start     Ordered   11/22/2021 1530  CBC  Once,   STAT        11/30/2021 1530   11/23/2021 1140  Urinalysis, Routine w reflex microscopic  Once,   URGENT        12/05/2021 1139            Vitals/Pain Today's Vitals   12/01/2021 1645 12/08/2021 1745 11/20/2021 1815 12/03/2021 1830  BP: 112/74 115/86 (!) 144/97 (!) 148/88  Pulse: 92 88 95 (!) 101  Resp: '13 12 12 14  '$ Temp:      TempSrc:      SpO2: 97% 97% 96% 97%  Weight:      Height:      PainSc:        Isolation Precautions Airborne and Contact precautions  Medications Medications  sodium chloride 0.9 % bolus 1,000 mL (0 mLs Intravenous Stopped 12/07/2021 1710)  ondansetron (ZOFRAN-ODT) disintegrating tablet 4 mg (4 mg Oral Given 11/28/2021 1253)  iohexol (OMNIPAQUE) 300 MG/ML  solution 100 mL (100 mLs Intravenous Contrast Given 11/15/2021 1502)  morphine (PF) 2 MG/ML injection 2 mg (2 mg Intravenous Given 12/01/2021 1532)  sodium chloride 0.9 % bolus 1,000 mL (1,000 mLs Intravenous New Bag/Given 11/22/2021 1656)    Mobility walks with device Moderate fall risk   Focused Assessments    R Recommendations: See Admitting Provider Note  Report given to:   Additional Notes:

## 2021-11-11 NOTE — Assessment & Plan Note (Signed)
Continue Dovato.

## 2021-11-11 NOTE — ED Provider Notes (Signed)
[facility] EMERGENCY DEPARTMENT Provider Note   CSN: [id number] Arrival date & time: 12/08/2021  1100     History  Chief Complaint  Patient presents with   Vomiting   Fever    [name] [name] is a 71 y.o. female with a past medical history of CAD, anxiety, HIV and multiple other medical problems presenting today due to viral symptoms.  Reports that last Saturday she started to have generalized abdominal pain with nausea and vomiting.  Denies any diarrhea or constipation..  Says that she was having fevers as high as 102.  No known sick contacts.  No changes in her diet.  Says that she has been throwing up every day since then and feels very weak and fatigued.  Saturday she had an episode of yellow/green sputum.  History of cholecystectomy   Fever      Home Medications Prior to Admission medications   Medication Sig Start Date End Date Taking? Authorizing Provider  acetaminophen (TYLENOL) 500 MG tablet Take 1,000 mg by mouth every 6 (six) hours as needed for mild pain.    [provider]  albuterol (VENTOLIN HFA) 108 (90 Base) MCG/ACT inhaler Inhale 1-2 puffs into the lungs every 6 (six) hours as needed for wheezing or shortness of breath.    [provider]  albuterol (VENTOLIN HFA) 108 (90 Base) MCG/ACT inhaler Inhale 1-2 puffs into the lungs every 6 (six) hours as needed for shortness of breath. 08/21/21   [provider]  aspirin EC 81 MG tablet Take 1 tablet (81 mg total) by mouth daily. 03/17/16   Dorothy Spark, MD  atorvastatin (LIPITOR) 80 MG tablet Take 80 mg by mouth daily at 6 PM.    [provider]  atorvastatin (LIPITOR) 80 MG tablet Take 80 mg by mouth every evening. 08/17/21   [provider]  BREO ELLIPTA 100-25 MCG/ACT AEPB Inhale 1 puff into the lungs daily. 08/21/21   [provider]  budesonide-formoterol (SYMBICORT) 160-4.5 MCG/ACT inhaler Inhale 2 puffs into the lungs 2 (two) times daily.      [provider]  clopidogrel (PLAVIX) 75 MG tablet Take 75 mg by mouth every evening.    [provider]  clopidogrel (PLAVIX) 75 MG tablet Take 75 mg by mouth daily. 08/21/21   [provider]  conjugated estrogens (PREMARIN) vaginal cream Place 1.44 Applicatorfuls vaginally every Wednesday. Pt only to use 1/4 of the applicator     [provider]  [employer] D3 25 MCG (1000 UT) capsule Take 1,000 Units by mouth daily. 04/07/21   [provider]  diclofenac Sodium (VOLTAREN) 1 % GEL Apply 2-4 g topically 2 (two) times daily as needed (joint pain). 05/18/21   [provider]  diclofenac Sodium (VOLTAREN) 1 % GEL Apply 2-3 g topically 3 (three) times daily as needed for pain. 07/29/21   [provider]  docusate sodium (COLACE) 100 MG capsule Take 1 capsule (100 mg total) by mouth 2 (two) times daily. 09/11/21   Donne Hazel, MD  dolutegravir-lamiVUDine (DOVATO) 50-300 MG tablet Take 1 tablet by mouth daily.    [provider]  DOVATO 50-300 MG tablet Take 1 tablet by mouth daily. 08/15/21   [provider]  furosemide (LASIX) 20 MG tablet Take 20 mg by mouth daily as needed for fluid. 06/01/21   [provider]  furosemide (LASIX) 20 MG tablet Take 20 mg by mouth daily as needed for edema. 07/20/21   [provider]  gabapentin (NEURONTIN) 300 MG capsule Take 1 capsule (300 mg total) by mouth 2 (two) times daily. 09/11/21 10/11/21  Donne Hazel, MD  lidocaine (LIDODERM) 5 % Place 1 patch onto the skin daily. 07/28/21   [provider]  methocarbamol (ROBAXIN) 500 MG tablet Take 500 mg by mouth every 6 (six) hours as needed for muscle spasms. 08/21/21   [provider]  midodrine (PROAMATINE) 10 MG tablet Take 1 tablet (10 mg total) by mouth 2 (two) times daily with a meal. 06/17/21   Domenic Polite, MD  midodrine (PROAMATINE) 10 MG tablet Take 10 mg by mouth 2 (two) times daily. 07/20/21   [provider]  nitroGLYCERIN (NITROSTAT) 0.4 MG SL tablet Place 1 tablet (0.4 mg total) under the tongue every 5 (five) minutes as needed for chest pain. 03/21/16   Belva Crome, MD  nitroGLYCERIN (NITROSTAT) 0.4 MG SL tablet Place 0.4 mg under the tongue every 5 (five) minutes as needed for chest pain. 07/20/21   [provider]  ondansetron (ZOFRAN) 4 MG tablet Take 1 tablet (4 mg total) by mouth every 8 (eight) hours as needed for nausea or vomiting. 06/17/21   Domenic Polite, MD  ondansetron (ZOFRAN) 4 MG tablet Take 4 mg by mouth every 8 (eight) hours as needed for nausea/vomiting. 08/21/21   [provider]  pantoprazole (PROTONIX) 40 MG tablet Take 40 mg by mouth daily. 05/24/21   [provider]  pantoprazole (PROTONIX) 40 MG tablet Take 40 mg by mouth daily. 08/23/21   [provider]  polyethylene glycol (MIRALAX / GLYCOLAX) 17 g packet Take 17 g by mouth 2 (two) times daily. 07/20/21   Patrecia Pour, MD  prochlorperazine (COMPAZINE) 5 MG tablet Take 1 tablet (5 mg total) by mouth every 6 (six) hours as needed for nausea or vomiting. 09/11/21   Donne Hazel, MD  ranolazine (RANEXA) 500 MG 12 hr tablet Take 500 mg by mouth 2 (two) times daily. 05/16/21   [provider]  ranolazine (RANEXA) 500 MG 12 hr tablet Take 500 mg by mouth 2 (two) times daily. 08/17/21   [provider]  senna-docusate (SENOKOT-S) 8.6-50 MG tablet Take 1 tablet by mouth 2 (two) times daily. Patient not taking: Reported on 08/01/2021 07/20/21   Patrecia Pour, MD  traMADol (ULTRAM) 50 MG tablet Take 1 tablet (50 mg total) by mouth every 6 (six) hours as needed for moderate pain. 09/11/21   Donne Hazel, MD  traZODone (DESYREL) 50 MG tablet Take 50 mg by mouth at bedtime. 05/04/21   [provider]  traZODone (DESYREL) 50 MG tablet Take 50 mg by mouth at bedtime. 07/25/21   [provider]  trospium (SANCTURA) 20 MG tablet Take 20 mg by mouth 2 (two) times  daily. 07/20/21   [provider]      Allergies    Isosorbide nitrate, Bactrim [sulfamethoxazole-trimethoprim], and Sulfamethoxazole-trimethoprim    Review of Systems   Review of Systems  Constitutional:  Positive for fever.    Physical Exam Updated Vital Signs BP 124/78   Pulse (!) 104   Temp 97.8 F (36.6 C) (Oral)   Resp 18   SpO2 98%  Physical Exam Vitals and nursing note reviewed.  Constitutional:      General: She is not in acute distress.    Appearance: Normal appearance. She is ill-appearing.  HENT:     Head: Normocephalic and atraumatic.     Mouth/Throat:  Mouth: Mucous membranes are dry.     Comments: Very dry mucous membranes Eyes:     General: No scleral icterus.    Conjunctiva/sclera: Conjunctivae normal.  Cardiovascular:     Rate and Rhythm: Regular rhythm. Tachycardia present.  Pulmonary:     Effort: Pulmonary effort is normal. No respiratory distress.  Abdominal:     General: Abdomen is flat.     Palpations: Abdomen is soft.     Tenderness: There is abdominal tenderness (Right upper quadrant).  Genitourinary:    Comments: Rectal exam performed in presence of RN.  Impacted stool in the rectum.  Digitally disimpacted by me.. Skin:    General: Skin is warm and dry.     Coloration: Skin is jaundiced.     Findings: No rash.  Neurological:     Mental Status: She is alert.  Psychiatric:        Mood and Affect: Mood normal.     ED Results / Procedures / Treatments   Labs (all labs ordered are listed, but only abnormal results are displayed) Labs Reviewed  COMPREHENSIVE METABOLIC PANEL - Abnormal; Notable for the following components:      Result Value   Potassium 3.3 (*)    Glucose, Bld 100 (*)    Total Protein 5.9 (*)    Albumin 2.7 (*)    AST 96 (*)    Total Bilirubin 1.3 (*)    Anion gap 17 (*)    All other components within normal limits  LACTIC ACID, PLASMA - Abnormal; Notable for the following components:   Lactic Acid,  Venous 2.8 (*)    All other components within normal limits  CBC WITH DIFFERENTIAL/PLATELET - Abnormal; Notable for the following components:   Monocytes Absolute 1.5 (*)    All other components within normal limits  CBG MONITORING, ED - Abnormal; Notable for the following components:   Glucose-Capillary 118 (*)    All other components within normal limits  SARS CORONAVIRUS 2 BY RT PCR  LACTIC ACID, PLASMA  LIPASE, BLOOD  MAGNESIUM  URINALYSIS, ROUTINE W REFLEX MICROSCOPIC  CBC    EKG None  Radiology No results found.  Procedures Fecal disimpaction  Date/Time: 12/08/2021 6:24 PM  Performed by: Rhae Hammock, PA-C Authorized by: Rhae Hammock, PA-C  Consent: Verbal consent obtained. Consent given by: patient Patient understanding: patient states understanding of the procedure being performed Imaging studies: imaging studies available Patient identity confirmed: arm band Local anesthesia used: no  Anesthesia: Local anesthesia used: no  Sedation: Patient sedated: no  Patient tolerance: patient tolerated the procedure well with no immediate complications      Medications Ordered in ED Medications  sodium chloride 0.9 % bolus 1,000 mL (0 mLs Intravenous Stopped 11/19/2021 1710)  ondansetron (ZOFRAN-ODT) disintegrating tablet 4 mg (4 mg Oral Given 11/27/2021 1253)  iohexol (OMNIPAQUE) 300 MG/ML solution 100 mL (100 mLs Intravenous Contrast Given 11/22/2021 1502)  morphine (PF) 2 MG/ML injection 2 mg (2 mg Intravenous Given 12/08/2021 1532)  sodium chloride 0.9 % bolus 1,000 mL (1,000 mLs Intravenous New Bag/Given 11/14/2021 1656)    ED Course/ Medical Decision Making/ A&P Clinical Course as of 12/08/2021 1754  Fri Nov 11, 2021  1254 Patient is a hard stick, nursing attempting to get a line. ODT zofran in the interim  [MR]  5176 Patient reevaluated and reports feeling much better with fluids, Zofran and morphine.  She was asleep but easily arousable.  No longer tachycardic  [MR]  Clinical Course User Index [MR] Geneen Dieter, Cecilio Asper, PA-C                           Medical Decision Making Amount and/or Complexity of Data Reviewed Labs: ordered. Radiology: ordered.  Risk Prescription drug management. Decision regarding hospitalization.   This is a 71 year old female who presents to the ED for concern of abdominal pain, nausea and vomiting.  Differential includes but is not limited to pancreatitis, hepatitis, PUD, GERD, gastroenteritis, gastritis and viral illness.   This is not an exhaustive differential.    Past Medical History / Co-morbidities / Social History: CAD, anxiety, HIV and sepsis secondary to pneumonia in July   Additional history: I reviewed patient's hospitalization in July.  She had a lactic acidosis at that time that resolved with fluid resuscitation.   Physical Exam: Pertinent physical exam findings include Cachectic, pale and volume down female.  Lab Tests: I ordered, and personally interpreted labs.  The pertinent results include: Lactic 2.8, likely secondary to hypovolemia Second lactic within normal limits after hydration   Imaging Studies: I ordered and independently visualized and interpreted CT and chest x-ray and I agree with the radiologist that there is a large stool burden.    Medications: I ordered medication including Zofran and morphine. Reevaluation of the patient after these medicines showed that the patient improved. I have reviewed the patients home medicines and have made adjustments as needed.    MDM/Disposition: This is a 71 year old female presenting today with nausea, vomiting and generalized abdominal pain.  CT with large stool burden.  Lactic 2.8, likely secondary to hypovolemia.  Resolving with IV fluids.  At this time and after conversations with my attending physician I believe patient is a candidate for admission due to decreased p.o. intake, nausea, vomiting and elevated lactic.  She is agreeable.     I discussed this case with my attending physician Dr. Vallery Ridge who saw the patient, cosigned this note including patient's presenting symptoms, physical exam, and planned diagnostics and interventions. Attending physician stated agreement with plan or made changes to plan which were implemented.     Final Clinical Impression(s) / ED Diagnoses Final diagnoses:  Nausea and vomiting, unspecified vomiting type  Constipation, unspecified constipation type  Lactic acidosis    Rx / DC Orders ED Discharge Orders     None      Admit to Dr. Posey Pronto with [facility]   Rhae Hammock, PA-C 11/26/2021 1900    Long, W[facility], MD 11/12/21 [phone]

## 2021-11-11 NOTE — Assessment & Plan Note (Addendum)
Stable.  Uses 2 L O2 via [place] qhs only as needed. -Continue Breo, albuterol as needed, supplemental O2 as needed

## 2021-11-11 NOTE — ED Triage Notes (Signed)
Patient is c/o vomiting for 6 days, with decreased p o intake, fevers and increased weakness. Denies urinary or respiratory symptoms.

## 2021-11-11 NOTE — ED Provider Notes (Signed)
I provided a substantive portion of the care of this patient.  I personally performed the entirety of the medical decision making for this encounter. {Remember to document shared critical care using "edcritical" dot phrase:1}    

## 2021-11-11 NOTE — Assessment & Plan Note (Signed)
Stable, denies any chest pain.  Continue aspirin, Plavix, atorvastatin, Ranexa.

## 2021-11-11 NOTE — Hospital Course (Signed)
[name] [name] is a 71 y.o. female with medical history significant for pulmonary fibrosis on 2 L O2 prn qhs, asthma, CAD, HIV, HLD, orthostatic hypotension who is admitted with dehydration due to nausea and vomiting.

## 2021-11-12 DIAGNOSIS — K760 Fatty (change of) liver, not elsewhere classified: Secondary | ICD-10-CM | POA: Diagnosis present

## 2021-11-12 DIAGNOSIS — E872 Acidosis, unspecified: Secondary | ICD-10-CM

## 2021-11-12 DIAGNOSIS — R252 Cramp and spasm: Secondary | ICD-10-CM | POA: Diagnosis present

## 2021-11-12 DIAGNOSIS — E876 Hypokalemia: Secondary | ICD-10-CM | POA: Diagnosis present

## 2021-11-12 DIAGNOSIS — Z20822 Contact with and (suspected) exposure to covid-19: Secondary | ICD-10-CM | POA: Diagnosis present

## 2021-11-12 DIAGNOSIS — K5900 Constipation, unspecified: Secondary | ICD-10-CM

## 2021-11-12 DIAGNOSIS — M62838 Other muscle spasm: Secondary | ICD-10-CM | POA: Diagnosis present

## 2021-11-12 DIAGNOSIS — J189 Pneumonia, unspecified organism: Secondary | ICD-10-CM | POA: Diagnosis present

## 2021-11-12 DIAGNOSIS — I2510 Atherosclerotic heart disease of native coronary artery without angina pectoris: Secondary | ICD-10-CM | POA: Diagnosis not present

## 2021-11-12 DIAGNOSIS — J9601 Acute respiratory failure with hypoxia: Secondary | ICD-10-CM | POA: Diagnosis not present

## 2021-11-12 DIAGNOSIS — J690 Pneumonitis due to inhalation of food and vomit: Secondary | ICD-10-CM | POA: Diagnosis present

## 2021-11-12 DIAGNOSIS — K222 Esophageal obstruction: Secondary | ICD-10-CM | POA: Diagnosis present

## 2021-11-12 DIAGNOSIS — J9621 Acute and chronic respiratory failure with hypoxia: Secondary | ICD-10-CM | POA: Diagnosis present

## 2021-11-12 DIAGNOSIS — B20 Human immunodeficiency virus [HIV] disease: Secondary | ICD-10-CM

## 2021-11-12 DIAGNOSIS — J45909 Unspecified asthma, uncomplicated: Secondary | ICD-10-CM | POA: Diagnosis present

## 2021-11-12 DIAGNOSIS — J9611 Chronic respiratory failure with hypoxia: Secondary | ICD-10-CM | POA: Diagnosis not present

## 2021-11-12 DIAGNOSIS — D649 Anemia, unspecified: Secondary | ICD-10-CM | POA: Diagnosis present

## 2021-11-12 DIAGNOSIS — E785 Hyperlipidemia, unspecified: Secondary | ICD-10-CM | POA: Diagnosis present

## 2021-11-12 DIAGNOSIS — J84112 Idiopathic pulmonary fibrosis: Secondary | ICD-10-CM | POA: Diagnosis present

## 2021-11-12 DIAGNOSIS — Z515 Encounter for palliative care: Secondary | ICD-10-CM | POA: Diagnosis not present

## 2021-11-12 DIAGNOSIS — R6521 Severe sepsis with septic shock: Secondary | ICD-10-CM | POA: Diagnosis not present

## 2021-11-12 DIAGNOSIS — E860 Dehydration: Secondary | ICD-10-CM | POA: Diagnosis present

## 2021-11-12 DIAGNOSIS — R112 Nausea with vomiting, unspecified: Secondary | ICD-10-CM | POA: Diagnosis not present

## 2021-11-12 DIAGNOSIS — I5030 Unspecified diastolic (congestive) heart failure: Secondary | ICD-10-CM | POA: Diagnosis present

## 2021-11-12 DIAGNOSIS — N390 Urinary tract infection, site not specified: Secondary | ICD-10-CM | POA: Diagnosis present

## 2021-11-12 DIAGNOSIS — A419 Sepsis, unspecified organism: Secondary | ICD-10-CM | POA: Diagnosis not present

## 2021-11-12 DIAGNOSIS — I110 Hypertensive heart disease with heart failure: Secondary | ICD-10-CM | POA: Diagnosis present

## 2021-11-12 DIAGNOSIS — Z66 Do not resuscitate: Secondary | ICD-10-CM | POA: Diagnosis not present

## 2021-11-12 DIAGNOSIS — Z21 Asymptomatic human immunodeficiency virus [HIV] infection status: Secondary | ICD-10-CM | POA: Diagnosis present

## 2021-11-12 LAB — COMPREHENSIVE METABOLIC PANEL
ALT: 30 U/L (ref 0–44)
AST: 59 U/L — ABNORMAL HIGH (ref 15–41)
Albumin: 2.1 g/dL — ABNORMAL LOW (ref 3.5–5.0)
Alkaline Phosphatase: 43 U/L (ref 38–126)
Anion gap: 5 (ref 5–15)
BUN: 6 mg/dL — ABNORMAL LOW (ref 8–23)
CO2: 23 mmol/L (ref 22–32)
Calcium: 8.2 mg/dL — ABNORMAL LOW (ref 8.9–10.3)
Chloride: 114 mmol/L — ABNORMAL HIGH (ref 98–111)
Creatinine, Ser: 0.53 mg/dL (ref 0.44–1.00)
GFR, Estimated: >60 mL/min (ref >60)
Glucose, Bld: 84 mg/dL (ref 70–99)
Potassium: 2.8 mmol/L — ABNORMAL LOW (ref 3.5–5.1)
Sodium: 142 mmol/L (ref 135–145)
Total Bilirubin: 1 mg/dL (ref 0.3–1.2)
Total Protein: 4.6 g/dL — ABNORMAL LOW (ref 6.5–8.1)

## 2021-11-12 LAB — GLUCOSE, CAPILLARY
Glucose-Capillary: 66 mg/dL — ABNORMAL LOW (ref 70–99)
Glucose-Capillary: 70 mg/dL (ref 70–99)
Glucose-Capillary: 76 mg/dL (ref 70–99)
Glucose-Capillary: 79 mg/dL (ref 70–99)
Glucose-Capillary: 88 mg/dL (ref 70–99)
Glucose-Capillary: 99 mg/dL (ref 70–99)

## 2021-11-12 LAB — CBC
HCT: 36.1 % (ref 36.0–46.0)
Hemoglobin: 12.1 g/dL (ref 12.0–15.0)
MCH: 32.6 pg (ref 26.0–34.0)
MCHC: 33.5 g/dL (ref 30.0–36.0)
MCV: 97.3 fL (ref 80.0–100.0)
Platelets: 232 10*3/uL (ref 150–400)
RBC: 3.71 MIL/uL — ABNORMAL LOW (ref 3.87–5.11)
RDW: 15.9 % — ABNORMAL HIGH (ref 11.5–15.5)
WBC: 7.4 10*3/uL (ref 4.0–10.5)
nRBC: 0 % (ref 0.0–0.2)

## 2021-11-12 LAB — MAGNESIUM: Magnesium: 1.7 mg/dL (ref 1.7–2.4)

## 2021-11-12 LAB — TSH: TSH: 2.054 u[IU]/mL (ref 0.350–4.500)

## 2021-11-12 LAB — PHOSPHORUS: Phosphorus: 2.9 mg/dL (ref 2.5–4.6)

## 2021-11-12 MED ORDER — SODIUM CHLORIDE 0.9 % IV SOLN
12.5000 mg | Freq: Once | INTRAVENOUS | Status: AC
  Administered 2021-11-12: 12.5 mg via INTRAVENOUS
  Filled 2021-11-12: qty 0.5

## 2021-11-12 MED ORDER — DEXTROSE-NACL 5-0.9 % IV SOLN: INTRAVENOUS | Status: DC

## 2021-11-12 MED ORDER — MAGNESIUM SULFATE 2 GM/50ML IV SOLN
2.0000 g | Freq: Once | INTRAVENOUS | Status: AC
  Administered 2021-11-12: 2 g via INTRAVENOUS
  Filled 2021-11-12: qty 50

## 2021-11-12 MED ORDER — POTASSIUM CHLORIDE 10 MEQ/100ML IV SOLN
10.0000 meq | INTRAVENOUS | Status: AC
  Administered 2021-11-12 (×6): 10 meq via INTRAVENOUS
  Filled 2021-11-12 (×6): qty 100

## 2021-11-12 MED ORDER — METHOCARBAMOL 1000 MG/10ML IJ SOLN
500.0000 mg | Freq: Two times a day (BID) | INTRAVENOUS | Status: DC
  Administered 2021-11-12 (×2): 500 mg via INTRAVENOUS
  Filled 2021-11-12 (×3): qty 5

## 2021-11-12 NOTE — Progress Notes (Signed)
[name] [name] GGY:[id number] DOB: [birth year] DOA: 12/03/2021 PCP: Pcp, No   Subj: 71 y.o. WF PMHx pulmonary fibrosis on 2 L O2 prn qhs, asthma, CAD, HIV, HLD, orthostatic hypotension, essential HTN, palpations, Hx gastric resection due to polyps  Presented to the ED for evaluation of nausea and vomiting.   Patient reports 5 days of persistent nausea and vomiting.  She has not been able to maintain any adequate oral intake.  She has felt dehydrated.  She has had some associated abdominal pain.  She denies any diarrhea, chest pain, dyspnea, dysuria.   PCXR negative for focal consolidation, edema, effusion.   CT abdomen/pelvis with contrast showed increased stool in rectum and proximal half of the colon.  Prior gastric resection with moderate to large hiatal hernia seen.  Fatty infiltration of liver and new cortical scar in upper pole right kidney seen.  No acute intra-abdominal or intrapelvic abnormalities.   Patient was given 1 L normal saline, Zofran, morphine.  Fecal disimpaction performed by EDP.  The hospitalist service was consulted to admit for further evaluation and management.   Obj: 9/2 A/O x4 positive for refractory nausea, negative vomiting, negative diarrhea.  Positive refractory leg cramping (pain).  Afebrile overnight,   Objective: VITAL SIGNS: Temp: 98 F (36.7 C) (09/02 0802) Temp Source: Oral (09/02 0802) BP: 118/62 (09/02 0802) Pulse Rate: 95 (09/02 0802) SPO2; FIO2:   Intake/Output Summary (Last 24 hours) at 11/12/2021 1833 Last data filed at 11/12/2021 1430 Gross per 24 hour  Intake 1424.42 ml  Output 0 ml  Net 1424.42 ml     Exam: General: A/O x4 No acute respiratory distress Lungs: Clear to auscultation bilaterally without wheezes or crackles Cardiovascular: Regular rate and rhythm without murmur gallop or rub normal S1 and S2 Abdomen: Positive generalized pain to palpation , nondistended, soft, bowel sounds positive, no rebound, no ascites, no  appreciable mass Extremities: No significant cyanosis, clubbing, or edema bilateral lower extremities Skin: Negative rashes, lesions, ulcers Psychiatric:  Negative depression, negative anxiety, negative fatigue, negative mania  Central nervous system:  Cranial nerves II through XII intact, tongue/uvula midline, all extremities muscle strength 5/5, sensation intact throughout, negative dysarthria, negative expressive aphasia, negative receptive aphasia.  . Mobility Assessment (last 72 hours)     Mobility Assessment     Row Name 11/12/21 08:02:23 11/12/21 0100         Does patient have an order for bedrest or is patient medically unstable No - Continue assessment Yes- Bedfast (Level 1) - Complete      What is the highest level of mobility based on the progressive mobility assessment? Level 3 (Stands with assist) - Balance while standing  and cannot march in place --      Is the above level different from baseline mobility prior to current illness? Yes - Recommend PT order --                 DVT prophylaxis: Lovenox Code Status: Full Family Communication:  Status is: Inpatient    Dispo: The patient is from: Home              Anticipated d/c is to: Home              Anticipated d/c date is: > 3 days              Patient currently is not medically stable to d/c.    Procedures/Significant Events:    Consultants:     Cultures  Antimicrobials:   A/P   Refractory Nausea and Vomiting Persistent nausea and vomiting for 5 days.  History of prior gastric resection.  CT A/P without acute abnormality.  Dehydrated on admission. -Advance diet as tolerated -Continue IV fluid hydration overnight -Thorazine 12.5 mg x 1 -D5-0.9% saline 63m/hr   Constipation -Disimpaction performed by EDP.   -Start on scheduled Senokot/MiraLAX.   Chronic respiratory failure with hypoxia (HCC) pulmonary fibrosis asthma -Stable.  Uses 2 L O2 via  qhs only as needed. -Continue  Breo, albuterol as needed, supplemental O2 as needed   HIV (human immunodeficiency virus infection) ([place] Continue Dovato.   CAD (coronary artery disease) -Stable, denies any chest pain.  Continue aspirin, Plavix, atorvastatin, Ranexa.  QTc prolongation - Ensure judicious use of QTc prolonging medication - Correct all electrolyte abnormalities - 9/3 obtain repeat EKG   Hypokalemia - Potassium goal> 4 - 9/2 Potassium IV 60 mEq  Hypomagnesmia - Magnesium goal> 2 - Magnesium IV 2 g  Muscle spasms/cramps - Robaxin IV 500 mg BID      Care during the described time interval was provided by me .  I have reviewed this patient's available data, including medical history, events of note, physical examination, and all test results as part of my evaluation.

## 2021-11-12 NOTE — Plan of Care (Signed)

## 2021-11-13 DIAGNOSIS — I2510 Atherosclerotic heart disease of native coronary artery without angina pectoris: Secondary | ICD-10-CM | POA: Diagnosis not present

## 2021-11-13 DIAGNOSIS — J9611 Chronic respiratory failure with hypoxia: Secondary | ICD-10-CM | POA: Diagnosis not present

## 2021-11-13 DIAGNOSIS — R112 Nausea with vomiting, unspecified: Secondary | ICD-10-CM | POA: Diagnosis not present

## 2021-11-13 DIAGNOSIS — K5900 Constipation, unspecified: Secondary | ICD-10-CM | POA: Diagnosis not present

## 2021-11-13 LAB — GLUCOSE, CAPILLARY
Glucose-Capillary: 96 mg/dL (ref 70–99)
Glucose-Capillary: 99 mg/dL (ref 70–99)
Glucose-Capillary: 118 mg/dL — ABNORMAL HIGH (ref 70–99)

## 2021-11-13 LAB — PHOSPHORUS: Phosphorus: 2 mg/dL — ABNORMAL LOW (ref 2.5–4.6)

## 2021-11-13 LAB — CBC WITH DIFFERENTIAL/PLATELET
Abs Immature Granulocytes: 0.05 10*3/uL (ref 0.00–0.07)
Basophils Absolute: 0 10*3/uL (ref 0.0–0.1)
Basophils Relative: 1 %
Eosinophils Absolute: 0.1 10*3/uL (ref 0.0–0.5)
Eosinophils Relative: 1 %
HCT: 33.7 % — ABNORMAL LOW (ref 36.0–46.0)
Hemoglobin: 11.3 g/dL — ABNORMAL LOW (ref 12.0–15.0)
Immature Granulocytes: 1 %
Lymphocytes Relative: 22 %
Lymphs Abs: 1.3 10*3/uL (ref 0.7–4.0)
MCH: 32.9 pg (ref 26.0–34.0)
MCHC: 33.5 g/dL (ref 30.0–36.0)
MCV: 98.3 fL (ref 80.0–100.0)
Monocytes Absolute: 0.8 10*3/uL (ref 0.1–1.0)
Monocytes Relative: 14 %
Neutro Abs: 3.7 10*3/uL (ref 1.7–7.7)
Neutrophils Relative %: 61 %
Platelets: 177 10*3/uL (ref 150–400)
RBC: 3.43 MIL/uL — ABNORMAL LOW (ref 3.87–5.11)
RDW: 16.1 % — ABNORMAL HIGH (ref 11.5–15.5)
WBC: 5.9 10*3/uL (ref 4.0–10.5)
nRBC: 0 % (ref 0.0–0.2)

## 2021-11-13 LAB — COMPREHENSIVE METABOLIC PANEL
ALT: 29 U/L (ref 0–44)
AST: 77 U/L — ABNORMAL HIGH (ref 15–41)
Albumin: 1.8 g/dL — ABNORMAL LOW (ref 3.5–5.0)
Alkaline Phosphatase: 39 U/L (ref 38–126)
Anion gap: 7 (ref 5–15)
BUN: 6 mg/dL — ABNORMAL LOW (ref 8–23)
CO2: 17 mmol/L — ABNORMAL LOW (ref 22–32)
Calcium: 7.4 mg/dL — ABNORMAL LOW (ref 8.9–10.3)
Chloride: 112 mmol/L — ABNORMAL HIGH (ref 98–111)
Creatinine, Ser: 0.5 mg/dL (ref 0.44–1.00)
GFR, Estimated: >60 mL/min (ref >60)
Glucose, Bld: 103 mg/dL — ABNORMAL HIGH (ref 70–99)
Potassium: 3.1 mmol/L — ABNORMAL LOW (ref 3.5–5.1)
Sodium: 136 mmol/L (ref 135–145)
Total Bilirubin: 0.6 mg/dL (ref 0.3–1.2)
Total Protein: 4.3 g/dL — ABNORMAL LOW (ref 6.5–8.1)

## 2021-11-13 LAB — MAGNESIUM: Magnesium: 2.1 mg/dL (ref 1.7–2.4)

## 2021-11-13 MED ORDER — CALCIUM GLUCONATE-NACL 1-0.675 GM/50ML-% IV SOLN
1.0000 g | Freq: Once | INTRAVENOUS | Status: AC
  Administered 2021-11-13: 1000 mg via INTRAVENOUS
  Filled 2021-11-13: qty 50

## 2021-11-13 MED ORDER — SODIUM PHOSPHATES 45 MMOLE/15ML IV SOLN
30.0000 mmol | Freq: Once | INTRAVENOUS | Status: AC
  Administered 2021-11-13: 30 mmol via INTRAVENOUS
  Filled 2021-11-13: qty 10

## 2021-11-13 MED ORDER — POTASSIUM CHLORIDE 10 MEQ/100ML IV SOLN
10.0000 meq | INTRAVENOUS | Status: AC
  Administered 2021-11-13 (×6): 10 meq via INTRAVENOUS
  Filled 2021-11-13 (×6): qty 100

## 2021-11-13 NOTE — Progress Notes (Signed)
[name] [name] JIR:[id number] DOB: [birth year] DOA: 11/28/2021 PCP: Pcp, No   Subj: 71 y.o. WF PMHx pulmonary fibrosis on 2 L O2 prn qhs, asthma, CAD, HIV, HLD, orthostatic hypotension, essential HTN, palpations, Hx gastric resection due to polyps  Presented to the ED for evaluation of nausea and vomiting.   Patient reports 5 days of persistent nausea and vomiting.  She has not been able to maintain any adequate oral intake.  She has felt dehydrated.  She has had some associated abdominal pain.  She denies any diarrhea, chest pain, dyspnea, dysuria.   PCXR negative for focal consolidation, edema, effusion.   CT abdomen/pelvis with contrast showed increased stool in rectum and proximal half of the colon.  Prior gastric resection with moderate to large hiatal hernia seen.  Fatty infiltration of liver and new cortical scar in upper pole right kidney seen.  No acute intra-abdominal or intrapelvic abnormalities.   Patient was given 1 L normal saline, Zofran, morphine.  Fecal disimpaction performed by EDP.  The hospitalist service was consulted to admit for further evaluation and management.   Obj: 9/3 afebrile overnight positive leg cramps/pain but improved.  Patient states last time she walked was April.  States last time she had BM was April    Objective: VITAL SIGNS: Temp: 97.4 F (36.3 C) (09/03 0806) Temp Source: Oral (09/03 0806) BP: 129/80 (09/03 0806) Pulse Rate: 85 (09/03 0806) SPO2; FIO2:   Intake/Output Summary (Last 24 hours) at 11/13/2021 1028 Last data filed at 11/13/2021 0630 Gross per 24 hour  Intake 961.96 ml  Output 750 ml  Net 211.96 ml      Exam: General: A/O x4 No acute respiratory distress Lungs: Clear to auscultation bilaterally without wheezes or crackles Cardiovascular: Regular rate and rhythm without murmur gallop or rub normal S1 and S2 Abdomen: Positive generalized pain to palpation , nondistended, soft, bowel sounds positive, no rebound, no ascites, no  appreciable mass Extremities: No significant cyanosis, clubbing, or edema bilateral lower extremities Skin: Negative rashes, lesions, ulcers Psychiatric:  Negative depression, negative anxiety, negative fatigue, negative mania  Central nervous system:  Cranial nerves II through XII intact, tongue/uvula midline, all extremities muscle strength 5/5, sensation intact throughout, negative dysarthria, negative expressive aphasia, negative receptive aphasia.  . Mobility Assessment (last 72 hours)     Mobility Assessment     Row Name 11/12/21 2037 11/12/21 08:02:23 11/12/21 0100       Does patient have an order for bedrest or is patient medically unstable No - Continue assessment No - Continue assessment Yes- Bedfast (Level 1) - Complete     What is the highest level of mobility based on the progressive mobility assessment? Level 2 (Chairfast) - Balance while sitting on edge of bed and cannot stand Level 3 (Stands with assist) - Balance while standing  and cannot march in place --     Is the above level different from baseline mobility prior to current illness? Yes - Recommend PT order Yes - Recommend PT order --                DVT prophylaxis: Lovenox Code Status: Full Family Communication:  Status is: Inpatient    Dispo: The patient is from: Home              Anticipated d/c is to: Home              Anticipated d/c date is: > 3 days  Patient currently is not medically stable to d/c.    Procedures/Significant Events:    Consultants:     Cultures   Antimicrobials:   A/P   Refractory Nausea and Vomiting Persistent nausea and vomiting for 5 days.  History of prior gastric resection.  CT A/P without acute abnormality.  Dehydrated on admission. -Advance diet as tolerated -Continue IV fluid hydration overnight -Thorazine 12.5 mg x 1 -D5-0.9% saline 71m/hr   Constipation -Disimpaction performed by EDP.   -Start on scheduled Senokot/MiraLAX.    Chronic respiratory failure with hypoxia (HCC) pulmonary fibrosis asthma -Stable.  Uses 2 L O2 via [place] qhs only as needed. -Continue Breo, albuterol as needed, supplemental O2 as needed   HIV (human immunodeficiency virus infection) ([place] -Continue Dovato.   CAD (coronary artery disease) -Stable, denies any chest pain.  Continue aspirin, Plavix, atorvastatin, Ranexa.  QTc prolongation - Ensure judicious use of QTc prolonging medication - Correct all electrolyte abnormalities - 9/3 QTc = 492    Hypokalemia - Potassium goal> 4 - 9/3 potassium IV 60 mEq  Hypomagnesmia - Magnesium goal> 2 - Magnesium IV 2 g  Hypophosphatemia - Phosphorus goal> 2.5 - 9/3 sodium phosphate IV 30 mmol  Hypocalcemia - Calcium goal> 8.9 - 9/3 Corrected calcium= 9.1 - 9/3 calcium gluconate 1 g  Muscle spasms/cramps - Robaxin IV 500 mg BID      Care during the described time interval was provided by me .  I have reviewed this patient's available data, including medical history, events of note, physical examination, and all test results as part of my evaluation.

## 2021-11-14 ENCOUNTER — Inpatient Hospital Stay (HOSPITAL_COMMUNITY): Payer: Medicare Other

## 2021-11-14 DIAGNOSIS — K5900 Constipation, unspecified: Secondary | ICD-10-CM | POA: Diagnosis not present

## 2021-11-14 DIAGNOSIS — R112 Nausea with vomiting, unspecified: Secondary | ICD-10-CM | POA: Diagnosis not present

## 2021-11-14 DIAGNOSIS — I2510 Atherosclerotic heart disease of native coronary artery without angina pectoris: Secondary | ICD-10-CM | POA: Diagnosis not present

## 2021-11-14 DIAGNOSIS — J9611 Chronic respiratory failure with hypoxia: Secondary | ICD-10-CM | POA: Diagnosis not present

## 2021-11-14 LAB — CBC WITH DIFFERENTIAL/PLATELET
Abs Immature Granulocytes: 0.07 10*3/uL (ref 0.00–0.07)
Basophils Absolute: 0 10*3/uL (ref 0.0–0.1)
Basophils Relative: 1 %
Eosinophils Absolute: 0.1 10*3/uL (ref 0.0–0.5)
Eosinophils Relative: 1 %
HCT: 37.9 % (ref 36.0–46.0)
Hemoglobin: 12.8 g/dL (ref 12.0–15.0)
Immature Granulocytes: 1 %
Lymphocytes Relative: 16 %
Lymphs Abs: 1.2 10*3/uL (ref 0.7–4.0)
MCH: 32.9 pg (ref 26.0–34.0)
MCHC: 33.8 g/dL (ref 30.0–36.0)
MCV: 97.4 fL (ref 80.0–100.0)
Monocytes Absolute: 1 10*3/uL (ref 0.1–1.0)
Monocytes Relative: 13 %
Neutro Abs: 5.4 10*3/uL (ref 1.7–7.7)
Neutrophils Relative %: 68 %
Platelets: 209 10*3/uL (ref 150–400)
RBC: 3.89 MIL/uL (ref 3.87–5.11)
RDW: 16.4 % — ABNORMAL HIGH (ref 11.5–15.5)
WBC: 7.8 10*3/uL (ref 4.0–10.5)
nRBC: 0 % (ref 0.0–0.2)

## 2021-11-14 LAB — COMPREHENSIVE METABOLIC PANEL
ALT: 39 U/L (ref 0–44)
AST: 116 U/L — ABNORMAL HIGH (ref 15–41)
Albumin: 2.1 g/dL — ABNORMAL LOW (ref 3.5–5.0)
Alkaline Phosphatase: 46 U/L (ref 38–126)
Anion gap: 7 (ref 5–15)
BUN: 5 mg/dL — ABNORMAL LOW (ref 8–23)
CO2: 18 mmol/L — ABNORMAL LOW (ref 22–32)
Calcium: 7.9 mg/dL — ABNORMAL LOW (ref 8.9–10.3)
Chloride: 113 mmol/L — ABNORMAL HIGH (ref 98–111)
Creatinine, Ser: 0.59 mg/dL (ref 0.44–1.00)
GFR, Estimated: >60 mL/min (ref >60)
Glucose, Bld: 86 mg/dL (ref 70–99)
Potassium: 4.4 mmol/L (ref 3.5–5.1)
Sodium: 138 mmol/L (ref 135–145)
Total Bilirubin: 0.8 mg/dL (ref 0.3–1.2)
Total Protein: 4.7 g/dL — ABNORMAL LOW (ref 6.5–8.1)

## 2021-11-14 LAB — GLUCOSE, CAPILLARY
Glucose-Capillary: 101 mg/dL — ABNORMAL HIGH (ref 70–99)
Glucose-Capillary: 109 mg/dL — ABNORMAL HIGH (ref 70–99)
Glucose-Capillary: 78 mg/dL (ref 70–99)
Glucose-Capillary: 83 mg/dL (ref 70–99)
Glucose-Capillary: 99 mg/dL (ref 70–99)

## 2021-11-14 LAB — MAGNESIUM: Magnesium: 1.7 mg/dL (ref 1.7–2.4)

## 2021-11-14 LAB — PHOSPHORUS: Phosphorus: 3.3 mg/dL (ref 2.5–4.6)

## 2021-11-14 MED ORDER — TRAMADOL HCL 50 MG PO TABS
50.0000 mg | ORAL_TABLET | Freq: Four times a day (QID) | ORAL | Status: DC | PRN
  Administered 2021-11-14 – 2021-11-15 (×2): 50 mg via ORAL
  Filled 2021-11-14 (×2): qty 1

## 2021-11-14 MED ORDER — METHOCARBAMOL 500 MG PO TABS: 500.0000 mg | ORAL_TABLET | Freq: Four times a day (QID) | ORAL | Status: DC | PRN

## 2021-11-14 MED ORDER — GABAPENTIN 300 MG PO CAPS
300.0000 mg | ORAL_CAPSULE | Freq: Two times a day (BID) | ORAL | Status: DC
  Administered 2021-11-14: 300 mg via ORAL
  Filled 2021-11-14 (×2): qty 1

## 2021-11-14 NOTE — Progress Notes (Signed)
[name] [name] GGE:[id number] DOB: 1950-09-07 DOA: 11/27/2021 PCP: Pcp, No   Subj: 71 y.o. WF PMHx pulmonary fibrosis on 2 L O2 prn qhs, asthma, CAD, HIV, HLD, orthostatic hypotension, essential HTN, palpations, Hx gastric resection due to polyps  Presented to the ED for evaluation of nausea and vomiting.   Patient reports 5 days of persistent nausea and vomiting.  She has not been able to maintain any adequate oral intake.  She has felt dehydrated.  She has had some associated abdominal pain.  She denies any diarrhea, chest pain, dyspnea, dysuria.   PCXR negative for focal consolidation, edema, effusion.   CT abdomen/pelvis with contrast showed increased stool in rectum and proximal half of the colon.  Prior gastric resection with moderate to large hiatal hernia seen.  Fatty infiltration of liver and new cortical scar in upper pole right kidney seen.  No acute intra-abdominal or intrapelvic abnormalities.   Patient was given 1 L normal saline, Zofran, morphine.  Fecal disimpaction performed by EDP.  The hospitalist service was consulted to admit for further evaluation and management.   Obj: 9/4 afebrile overnight A/O x4 today feels like she wants to eat.  Family present and will assist patient in eating lunch.  Daughter confirmed that last time patient ambulated without assistance was April.    Objective: VITAL SIGNS: Temp: 97.6 F (36.4 C) (09/04 0816) Temp Source: Oral (09/04 0816) BP: 101/51 (09/04 0816) Pulse Rate: 93 (09/04 0816) SPO2; FIO2:   Intake/Output Summary (Last 24 hours) at 11/14/2021 4650 Last data filed at 11/13/2021 2009 Gross per 24 hour  Intake 889.63 ml  Output --  Net 889.63 ml      Exam: General: A/O x4 No acute respiratory distress Lungs: Clear to auscultation bilaterally without wheezes or crackles Cardiovascular: Regular rate and rhythm without murmur gallop or rub normal S1 and S2 Abdomen: Positive generalized pain to palpation , nondistended,  soft, bowel sounds positive, no rebound, no ascites, no appreciable mass Extremities: No significant cyanosis, clubbing, or edema bilateral lower extremities Skin: Negative rashes, lesions, ulcers Psychiatric:  Negative depression, negative anxiety, negative fatigue, negative mania  Central nervous system:  Cranial nerves II through XII intact, tongue/uvula midline, all extremities muscle strength 5/5, sensation intact throughout, negative dysarthria, negative expressive aphasia, negative receptive aphasia.  . Mobility Assessment (last 72 hours)     Mobility Assessment     Row Name 11/13/21 2009 11/12/21 2037 11/12/21 08:02:23 11/12/21 0100     Does patient have an order for bedrest or is patient medically unstable No - Continue assessment No - Continue assessment No - Continue assessment Yes- Bedfast (Level 1) - Complete    What is the highest level of mobility based on the progressive mobility assessment? Level 2 (Chairfast) - Balance while sitting on edge of bed and cannot stand Level 2 (Chairfast) - Balance while sitting on edge of bed and cannot stand Level 3 (Stands with assist) - Balance while standing  and cannot march in place --    Is the above level different from baseline mobility prior to current illness? Yes - Recommend PT order Yes - Recommend PT order Yes - Recommend PT order --               DVT prophylaxis: Lovenox Code Status: Full Family Communication:  Status is: Inpatient    Dispo: The patient is from: Home              Anticipated d/c is to: Home  Anticipated d/c date is: > 3 days              Patient currently is not medically stable to d/c.    Procedures/Significant Events: 9/4 CT L-spine WO contrast Segmentation: Partially sacralized L5.   Vertebrae: No acute lumbar spine fracture.   Paraspinal and other soft tissues: Prior cholecystectomy. Scattered atherosclerosis. No other significant findings.   Disc levels: Mild disc bulging  at L3-L4 and L4-L5 with ligament flavum thickening facet arthropathy results in no significant canal stenosis at L3-L4 with a least mild spinal canal stenosis at L4-L5. There is mild bilateral neural foraminal narrowing, right greater than left at L4-L5.   IMPRESSION: No evidence of acute lumbar spine fracture.   Multilevel degenerative changes most prominent at L3-L4 and L4-L5. Mild spinal canal stenosis and mild right greater left neural foraminal stenosis at L4-L5.  Consultants:     Cultures   Antimicrobials:   A/P   Refractory Nausea and Vomiting Persistent nausea and vomiting for 5 days.  History of prior gastric resection.  CT A/P without acute abnormality.  Dehydrated on admission. -Advance diet as tolerated -Continue IV fluid hydration overnight -Thorazine 12.5 mg x 1 -D5-0.9% saline 18m/hr -9/4 resolved patient wants to eat lunch today.  Will advance diet as tolerated   Constipation -Disimpaction performed by EDP.   -Start on scheduled Senokot/MiraLAX.   Chronic respiratory failure with hypoxia (HCC) pulmonary fibrosis asthma -Stable.  Uses 2 L O2 via [place] qhs only as needed. -Continue Breo, albuterol as needed, supplemental O2 as needed   HIV (human immunodeficiency virus infection) ([place] -Continue Dovato.   CAD (coronary artery disease) -Stable, denies any chest pain.  Continue aspirin, Plavix, atorvastatin, Ranexa.  QTc prolongation - Ensure judicious use of QTc prolonging medication - Correct all electrolyte abnormalities - 9/3 QTc = 492    Hypokalemia - Potassium goal> 4 - 9/3 potassium IV 60 mEq  Hypomagnesmia - Magnesium goal> 2 - Magnesium IV 2 g  Hypophosphatemia - Phosphorus goal> 2.5 - 9/3 sodium phosphate IV 30 mmol  Hypocalcemia - Calcium goal> 8.9 - 9/3 Corrected calcium= 9.1 - 9/3 calcium gluconate 1 g  Muscle spasms/cramps - Robaxin IV 500 mg BID  Goals of care - 9/4 PT/OT consult.  Patient with multiple medical  problems, deteriorating ability to ambulate and perform ADLs (began in April).  Evaluate for CIR vs SNF    Care during the described time interval was provided by me .  I have reviewed this patient's available data, including medical history, events of note, physical examination, and all test results as part of my evaluation.

## 2021-11-15 ENCOUNTER — Inpatient Hospital Stay (HOSPITAL_COMMUNITY): Payer: Medicare Other

## 2021-11-15 DIAGNOSIS — I2510 Atherosclerotic heart disease of native coronary artery without angina pectoris: Secondary | ICD-10-CM | POA: Diagnosis not present

## 2021-11-15 DIAGNOSIS — J9611 Chronic respiratory failure with hypoxia: Secondary | ICD-10-CM | POA: Diagnosis not present

## 2021-11-15 DIAGNOSIS — K5900 Constipation, unspecified: Secondary | ICD-10-CM | POA: Diagnosis not present

## 2021-11-15 DIAGNOSIS — R112 Nausea with vomiting, unspecified: Secondary | ICD-10-CM | POA: Diagnosis not present

## 2021-11-15 DIAGNOSIS — Z21 Asymptomatic human immunodeficiency virus [HIV] infection status: Secondary | ICD-10-CM

## 2021-11-15 LAB — COMPREHENSIVE METABOLIC PANEL
ALT: 29 U/L (ref 0–44)
AST: 72 U/L — ABNORMAL HIGH (ref 15–41)
Albumin: 1.6 g/dL — ABNORMAL LOW (ref 3.5–5.0)
Alkaline Phosphatase: 48 U/L (ref 38–126)
Anion gap: 10 (ref 5–15)
BUN: 5 mg/dL — ABNORMAL LOW (ref 8–23)
CO2: 14 mmol/L — ABNORMAL LOW (ref 22–32)
Calcium: 7.8 mg/dL — ABNORMAL LOW (ref 8.9–10.3)
Chloride: 116 mmol/L — ABNORMAL HIGH (ref 98–111)
Creatinine, Ser: 0.57 mg/dL (ref 0.44–1.00)
GFR, Estimated: >60 mL/min (ref >60)
Glucose, Bld: 113 mg/dL — ABNORMAL HIGH (ref 70–99)
Potassium: 4.4 mmol/L (ref 3.5–5.1)
Sodium: 140 mmol/L (ref 135–145)
Total Bilirubin: 0.5 mg/dL (ref 0.3–1.2)
Total Protein: 3.7 g/dL — ABNORMAL LOW (ref 6.5–8.1)

## 2021-11-15 LAB — GLUCOSE, CAPILLARY
Glucose-Capillary: 108 mg/dL — ABNORMAL HIGH (ref 70–99)
Glucose-Capillary: 154 mg/dL — ABNORMAL HIGH (ref 70–99)
Glucose-Capillary: 119 mg/dL — ABNORMAL HIGH (ref 70–99)
Glucose-Capillary: 120 mg/dL — ABNORMAL HIGH (ref 70–99)

## 2021-11-15 LAB — MAGNESIUM: Magnesium: 1.6 mg/dL — ABNORMAL LOW (ref 1.7–2.4)

## 2021-11-15 LAB — PHOSPHORUS: Phosphorus: 2.8 mg/dL (ref 2.5–4.6)

## 2021-11-15 LAB — HIV-1 RNA QUANT-NO REFLEX-BLD
HIV 1 RNA Quant: 140 copies/mL
LOG10 HIV-1 RNA: 2.146 log10copy/mL

## 2021-11-15 MED ORDER — TAMSULOSIN HCL 0.4 MG PO CAPS
0.4000 mg | ORAL_CAPSULE | Freq: Every day | ORAL | Status: DC
Start: 2021-11-15 — End: 2021-11-16
  Administered 2021-11-15 – 2021-11-16 (×2): 0.4 mg via ORAL
  Filled 2021-11-15 (×2): qty 1

## 2021-11-15 MED ORDER — MAGNESIUM SULFATE 50 % IJ SOLN
3.0000 g | Freq: Once | INTRAVENOUS | Status: AC
  Administered 2021-11-15: 3 g via INTRAVENOUS
  Filled 2021-11-15 (×2): qty 6

## 2021-11-15 MED ORDER — GABAPENTIN 100 MG PO CAPS
200.0000 mg | ORAL_CAPSULE | Freq: Two times a day (BID) | ORAL | Status: DC
  Administered 2021-11-15 – 2021-11-16 (×3): 200 mg via ORAL
  Filled 2021-11-15 (×3): qty 2

## 2021-11-15 MED ORDER — GADOBUTROL 1 MMOL/ML IV SOLN
6.0000 mL | Freq: Once | INTRAVENOUS | Status: AC | PRN
Start: 2021-11-15 — End: 2021-11-15
  Administered 2021-11-15: 6 mL via INTRAVENOUS

## 2021-11-15 NOTE — Progress Notes (Signed)
[name] [name] FKC:[id number] DOB: 13-Jul-1950 DOA: 11/14/2021 PCP: Pcp, No   Subj: 71 y.o. WF PMHx pulmonary fibrosis on 2 L O2 prn qhs, asthma, CAD, HIV, HLD, orthostatic hypotension, essential HTN, palpations, Hx gastric resection due to polyps  Presented to the ED for evaluation of nausea and vomiting.   Patient reports 5 days of persistent nausea and vomiting.  She has not been able to maintain any adequate oral intake.  She has felt dehydrated.  She has had some associated abdominal pain.  She denies any diarrhea, chest pain, dyspnea, dysuria.   PCXR negative for focal consolidation, edema, effusion.   CT abdomen/pelvis with contrast showed increased stool in rectum and proximal half of the colon.  Prior gastric resection with moderate to large hiatal hernia seen.  Fatty infiltration of liver and new cortical scar in upper pole right kidney seen.  No acute intra-abdominal or intrapelvic abnormalities.   Patient was given 1 L normal saline, Zofran, morphine.  Fecal disimpaction performed by EDP.  The hospitalist service was consulted to admit for further evaluation and management.   Obj: 9/5 A/O x4, patient complains of bilateral lower extremity neuropathy pain.  Patient states she has taken Flomax in the past without any side effects.   Objective: VITAL SIGNS: Temp: 97.8 F (36.6 C) (09/05 0753) Temp Source: Oral (09/05 0753) BP: 110/59 (09/05 0844) Pulse Rate: 93 (09/05 0844) SPO2; FIO2:   Intake/Output Summary (Last 24 hours) at 11/15/2021 0949 Last data filed at 11/15/2021 0400 Gross per 24 hour  Intake 2410.18 ml  Output 525 ml  Net 1885.18 ml      Exam: General: A/O x4 No acute respiratory distress Lungs: Clear to auscultation bilaterally without wheezes or crackles Cardiovascular: Regular rate and rhythm without murmur gallop or rub normal S1 and S2 Abdomen: Positive generalized pain to palpation , nondistended, soft, bowel sounds positive, no rebound, no ascites,  no appreciable mass Extremities: No significant cyanosis, clubbing, or edema bilateral lower extremities Skin: Negative rashes, lesions, ulcers Psychiatric:  Negative depression, negative anxiety, negative fatigue, negative mania  Central nervous system:  Cranial nerves II through XII intact, tongue/uvula midline, all extremities muscle strength 5/5, sensation intact throughout, negative dysarthria, negative expressive aphasia, negative receptive aphasia.  . Mobility Assessment (last 72 hours)     Mobility Assessment     Row Name 11/15/21 0811 11/14/21 1935 11/14/21 0734 11/13/21 2009 11/12/21 2037   Does patient have an order for bedrest or is patient medically unstable No - Continue assessment No - Continue assessment No - Continue assessment No - Continue assessment No - Continue assessment   What is the highest level of mobility based on the progressive mobility assessment? Level 2 (Chairfast) - Balance while sitting on edge of bed and cannot stand Level 2 (Chairfast) - Balance while sitting on edge of bed and cannot stand Level 2 (Chairfast) - Balance while sitting on edge of bed and cannot stand Level 2 (Chairfast) - Balance while sitting on edge of bed and cannot stand Level 2 (Chairfast) - Balance while sitting on edge of bed and cannot stand   Is the above level different from baseline mobility prior to current illness? Yes - Recommend PT order -- Yes - Recommend PT order Yes - Recommend PT order Yes - Recommend PT order              DVT prophylaxis: Lovenox Code Status: Full Family Communication:  Status is: Inpatient    Dispo: The patient is from:  Home              Anticipated d/c is to: Home              Anticipated d/c date is: > 3 days              Patient currently is not medically stable to d/c.    Procedures/Significant Events: 9/4 CT L-spine WO contrast Segmentation: Partially sacralized L5.   Vertebrae: No acute lumbar spine fracture.   Paraspinal and  other soft tissues: Prior cholecystectomy. Scattered atherosclerosis. No other significant findings.   Disc levels: Mild disc bulging at L3-L4 and L4-L5 with ligament flavum thickening facet arthropathy results in no significant canal stenosis at L3-L4 with a least mild spinal canal stenosis at L4-L5. There is mild bilateral neural foraminal narrowing, right greater than left at L4-L5.   IMPRESSION: No evidence of acute lumbar spine fracture.   Multilevel degenerative changes most prominent at L3-L4 and L4-L5. Mild spinal canal stenosis and mild right greater left neural foraminal stenosis at L4-L5. 9/5 MRI brain - Negative acute or subacute findings  Consultants:     Cultures   Antimicrobials:   A/P   Refractory Nausea and Vomiting Persistent nausea and vomiting for 5 days.  History of prior gastric resection.  CT A/P without acute abnormality.  Dehydrated on admission. -Advance diet as tolerated -Continue IV fluid hydration overnight -Thorazine 12.5 mg x 1 -D5-0.9% saline 19m/hr -9/4 resolved patient wants to eat lunch today.  Will advance diet as tolerated   Constipation -Disimpaction performed by EDP.   -Start on scheduled Senokot/MiraLAX.   Chronic respiratory failure with hypoxia (HCC) pulmonary fibrosis asthma -Stable.  Uses 2 L O2 via [place] qhs only as needed. -Continue Breo, albuterol as needed, supplemental O2 as needed   HIV (human immunodeficiency virus infection) ([place] -Continue Dovato.   CAD (coronary artery disease) -Stable, denies any chest pain.  Continue aspirin, Plavix, atorvastatin, Ranexa.  QTc prolongation - Ensure judicious use of QTc prolonging medication - Correct all electrolyte abnormalities - 9/3 QTc = 492    Hypokalemia - Potassium goal> 4 - 9/3 potassium IV 60 mEq  Hypomagnesmia - Magnesium goal> 2 - 9/5 Magnesium IV 3 g  Hypophosphatemia - Phosphorus goal> 2.5 - 9/3 sodium phosphate IV 30 mmol  Hypocalcemia -  Calcium goal> 8.9 - 9/3 Corrected calcium= 9.1 - 9/3 calcium gluconate 1 g  Muscle spasms/cramps - 9/5 Robaxin IV 500 mg BID (hold) urinary retention  Urinary retention - 9/5 Flomax 0.4 mg daily  Neuropathy - Gabapentin 200 mg BID    Goals of care - 9/4 PT/OT consult.  Patient with multiple medical problems, deteriorating ability to ambulate and perform ADLs (began in April).  Evaluate for CIR vs SNF    Care during the described time interval was provided by me .  I have reviewed this patient's available data, including medical history, events of note, physical examination, and all test results as part of my evaluation.

## 2021-11-15 NOTE — Progress Notes (Signed)
Dr. Sherral Hammers notified pt having retention. Ok to place foley

## 2021-11-15 NOTE — Evaluation (Signed)
Occupational Therapy Evaluation Patient Details Name: [name] [name] MRN: [id number] DOB: Dec 17, 1950 Today's Date: 11/15/2021   History of Present Illness [name] [name] is a 71 y.o. female presented to the ED for evaluation of nausea and vomiting--found to have stool burden. PHMx: pulmonary fibrosis on 2 L O2 prn qhs, asthma, CAD, HIV, HLD, orthostatic hypotension.   Clinical Impression   This 71 yo female admitted with above presents to acute OT today with decreased control/ataxic movements of Bil UEs and trunk, decreased AROM/coordination of Bil UEs, low tone in trunk, decreased AROM of legs with pain anytime they are moved/touched, decreased sitting balance and coughing when drinking thin liquids through straws. Per pt she has not been able to really ambulate since April of this year and since the last 2 weeks she has needed more A for basic ADLs and mobility. She will continue to benefit from acute OT with follow up at SNF.     Recommendations for follow up therapy are one component of a multi-disciplinary discharge planning process, led by the attending physician.  Recommendations may be updated based on patient status, additional functional criteria and insurance authorization.   Follow Up Recommendations  Skilled nursing-short term rehab (<3 hours/day)    Assistance Recommended at Discharge Frequent or constant Supervision/Assistance  Patient can return home with the following Two people to help with walking and/or transfers;A lot of help with bathing/dressing/bathroom;Assistance with cooking/housework;Assistance with feeding;Help with stairs or ramp for entrance;Assist for transportation;Direct supervision/assist for financial management;Direct supervision/assist for medications management    Functional Status Assessment  Patient has had a recent decline in their functional status and demonstrates the ability to make significant improvements in function in a reasonable and  predictable amount of time.  Equipment Recommendations  BSC/3in1       Precautions / Restrictions Precautions Precautions: Fall Restrictions Weight Bearing Restrictions: No      Mobility Bed Mobility Overal bed mobility: Needs Assistance Bed Mobility: Rolling, Supine to Sit, Sit to Supine Rolling: Min assist   Supine to sit: Max assist, +2 for physical assistance Sit to supine: Total assist, +2 for physical assistance   General bed mobility comments: VCs needs for all movements as well as physical A at legs and trunk due to ataxic movements    Transfers Overall transfer level: Needs assistance Equipment used: 2 person hand held assist Transfers: Sit to/from Stand Sit to Stand: Total assist, +2 physical assistance           General transfer comment: Pt only minimally able to clear buttocks off the bed.      Balance Overall balance assessment: Needs assistance Sitting-balance support: Bilateral upper extremity supported, Feet supported Sitting balance-Leahy Scale: Poor Sitting balance - Comments: cannot maintain sitting EOB by herself and when she tries to move an arm from supporting her she loses her balance even more.   Standing balance support: Bilateral upper extremity supported Standing balance-Leahy Scale: Zero                             ADL either performed or assessed with clinical judgement   ADL Overall ADL's : Needs assistance/impaired Eating/Feeding: Maximal assistance;Bed level   Grooming: Maximal assistance;Bed level   Upper Body Bathing: Maximal assistance;Bed level   Lower Body Bathing: Total assistance;Bed level   Upper Body Dressing : Total assistance;Bed level   Lower Body Dressing: Total assistance;Bed level  Vision Patient Visual Report: No change from baseline              Pertinent Vitals/Pain Pain Assessment Pain Assessment: Faces Faces Pain Scale: Hurts even more Pain Location: in  legs when assisting her with moving them in bed and to EOB (reports its the whole leg) Pain Descriptors / Indicators: Aching, Sore Pain Intervention(s): Limited activity within patient's tolerance, Monitored during session, Repositioned     Hand Dominance Right   Extremity/Trunk Assessment Upper Extremity Assessment Upper Extremity Assessment: RUE deficits/detail;LUE deficits/detail RUE Deficits / Details: Ataxic movements RUE Sensation: decreased proprioception RUE Coordination: decreased fine motor;decreased gross motor LUE Deficits / Details: Ataxic movements LUE Sensation: decreased proprioception LUE Coordination: decreased fine motor;decreased gross motor           Communication Communication Communication: No difficulties (soft spoken at times)   Cognition Arousal/Alertness: Awake/alert Behavior During Therapy: WFL for tasks assessed/performed Overall Cognitive Status: No family/caregiver present to determine baseline cognitive functioning                                 General Comments: A & O x4; attempted to follow all movements asked of her     General Comments  VSS            Home Living Family/patient expects to be discharged to:: Private residence Living Arrangements: Children Available Help at Discharge: Family;Available 24 hours/day Type of Home: Apartment Home Access: Stairs to enter Entrance Stairs-Number of Steps: 14 Entrance Stairs-Rails: Right Home Layout: One level     Bathroom Shower/Tub: Teacher, early years/pre: Standard     Home Equipment: Rollator (4 wheels)   Additional Comments: Lives with daughter and grandson      Prior Functioning/Environment Prior Level of Function : Needs assist;Patient poor historian/Family not available             Mobility Comments: Per pt she was able to go down steps with A of one person but needed to be lifted up the steps as of 2 weeks ago. Reports she could stand pivot  with a RW up until 2 weeks ago, but has not really ambulated since April of this year.          OT Problem List: Decreased strength;Impaired balance (sitting and/or standing);Decreased coordination;Impaired UE functional use;Impaired tone;Pain      OT Treatment/Interventions: Self-care/ADL training;Patient/family education;Therapeutic exercise;Therapeutic activities;Balance training    OT Goals(Current goals can be found in the care plan section) Acute Rehab OT Goals Patient Stated Goal: agreeable to sit EOB, but not to get OOB today OT Goal Formulation: With patient Time For Goal Achievement: 11/29/21 Potential to Achieve Goals: Good  OT Frequency: Min 2X/week    Co-evaluation PT/OT/SLP Co-Evaluation/Treatment: Yes Reason for Co-Treatment: For patient/therapist safety;To address functional/ADL transfers PT goals addressed during session: Mobility/safety with mobility;Balance;Strengthening/ROM OT goals addressed during session: Strengthening/ROM;ADL's and self-care      AM-PAC OT "6 Clicks" Daily Activity     Outcome Measure Help from another person eating meals?: A Lot Help from another person taking care of personal grooming?: A Lot Help from another person toileting, which includes using toliet, bedpan, or urinal?: Total Help from another person bathing (including washing, rinsing, drying)?: A Lot Help from another person to put on and taking off regular upper body clothing?: Total Help from another person to put on and taking off regular lower body clothing?: Total 6 Click Score:  9   End of Session Equipment Utilized During Treatment: Gait belt Nurse Communication: Mobility status (cleaned pt up from small bowel smears on bed pad, recommend SLP for swallowing due to coughing with thin liquids with straws and NT also states she feels pt's swallowing has changed since pt has been here.)  Activity Tolerance: Patient tolerated treatment well Patient left: in bed;with call  bell/phone within reach;with bed alarm set  OT Visit Diagnosis: Unsteadiness on feet (R26.81);Other abnormalities of gait and mobility (R26.89);Muscle weakness (generalized) (M62.81);Pain;Other (comment) (ataxia) Pain - Right/Left:  (both) Pain - part of body: Leg                Time: 1751-0258 OT Time Calculation (min): 28 min Charges:  OT General Charges $OT Visit: 1 Visit OT Evaluation $OT Eval Moderate Complexity: G[place]am, OTR/L Acute [employer] [facility] [phone] Office [phone]    Almon Register 11/15/2021, 11:02 AM

## 2021-11-15 NOTE — Progress Notes (Signed)
Dr. Sherral Hammers notified Phlebotomy has attempted drawing morning labs multiple times. She is a difficult stick. We are unable to get STAT labs at this time. She may benefit from a midline if necessary. No response at this time.

## 2021-11-15 NOTE — Progress Notes (Signed)
SLP Cancellation Note  Patient Details Name: Nieves Chapa MRN: [id number] DOB: 1950-12-05   Cancelled treatment:       Reason Eval/Treat Not Completed: Patient at procedure or test/unavailable- will continue efforts.  Kassity Woodson L. Tivis Ringer, MA CCC/SLP Clinical Specialist - Acute Care SLP Acute Rehabilitation Services Office number [phone]    Juan Quam Laurice 11/15/2021, 2:09 PM

## 2021-11-15 NOTE — Progress Notes (Signed)
IVT consulted for therapeutic phlebotomy.  RN states pt needs blood draw; Phlebotomy unsuccessful in collection.  Advised RN to follow up w/MD for next step. Pt currently has 2 PIV's in flowsheet.

## 2021-11-15 NOTE — Evaluation (Signed)
Clinical/Bedside Swallow Evaluation Patient Details  Name: Vercie Pokorny MRN: [id number] Date of Birth: 05-16-1950  Today's Date: 11/15/2021 Time: SLP Start Time (ACUTE ONLY): 8185 SLP Stop Time (ACUTE ONLY): 1728 SLP Time Calculation (min) (ACUTE ONLY): 14 min  Past Medical History:  Past Medical History:  Diagnosis Date   Asthma    Asthma exacerbation 04/21/2018   Bradycardia    Sinus bradycardia   CAD (coronary artery disease)    Minimal by history, Question coronary artery spasm   Closed fracture of sacrum (HCC) 12/23/2010   Colon polyps    Dyslipidemia    GERD (gastroesophageal reflux disease)    Esophageal stricture   H/O: hysterectomy    Hiatal hernia    HIV positive ([place])    Needle stick 1993   Hypertension    Influenza due to identified novel influenza A virus with other respiratory manifestations 04/22/2018   Palpitations    Peptic ulcer disease    Pulmonary fibrosis ([place])    on 2L home O2, prn at night   Shortness of breath    Extensive evaluation Dr. Winona Legato, [facility], May, 6314, etiology uncertain, probable bronchiolitis possibility of lung biopsy at that time   Weight loss    Past Surgical History:  Past Surgical History:  Procedure Laterality Date   ABDOMINAL HYSTERECTOMY     APPENDECTOMY     CARDIAC CATHETERIZATION N/A 03/21/2016   Procedure: Right/Left Heart Cath and Coronary Angiography;  Surgeon: Belva Crome, MD;  Location: [facility];  Service: Cardiovascular;  Laterality: N/A;   CHOLECYSTECTOMY     CORONARY STENT PLACEMENT  12/2017   GASTRECTOMY     HC OCCIPITAL NERVE BLOCK  08/01/2021   VENTRAL HERNIA REPAIR     HPI:  Linah Jourdin Gens is a 71 y.o. female who presented to the ED for evaluation of nausea and vomiting--found to have stool burden. PT/OT evals 9/5 found decreased control/ataxic movements of Bil UEs, significant weakness, unable to stand. MRI pending.  PHMx: pulmonary fibrosis on 2 L O2 prn qhs, hx gastric resection, HH,   GERD, esophageal stricture, asthma, CAD, HIV, HLD, orthostatic hypotension.    Assessment / Plan / Recommendation  Clinical Impression  Pt presented with normal swallowing function.  Oral mechanism exam was unremarkable. She is edentulous and reports her dentures are at home. There were no indications of an oropharyngeal dysphagia: oral control/mastication was functional; swallow response appeared to be timely; there were no s/s of aspiration. She was fed six teaspoons of pudding and several sips of water, then reported feeling nauseous. Emesis bag was provided; called nursing to room.    There are no s/s of an oropharyngeal dysphagia. Defer diet recommendations to MD given her current GI issues. She will need assistance with feeding given bil UE ataxia.  SLP will sign off. SLP Visit Diagnosis: Dysphagia, unspecified (R13.10)    Aspiration Risk  No limitations    Diet Recommendation   Advance per MD  Medication Administration: Whole meds with liquid    Other  Recommendations Oral Care Recommendations: Oral care BID    Recommendations for follow up therapy are one component of a multi-disciplinary discharge planning process, led by the attending physician.  Recommendations may be updated based on patient status, additional functional criteria and insurance authorization.  Follow up Recommendations No SLP follow up        [place] Date of Onset: 11/30/2021 HPI: Jaquasha Lakechia Nay is a 71 y.o. female who  presented to the ED for evaluation of nausea and vomiting--found to have stool burden. PT/OT evals 9/5 found decreased control/ataxic movements of Bil UEs, significant weakness, unable to stand. MRI pending.  PHMx: pulmonary fibrosis on 2 L O2 prn qhs, hx gastric resection, HH,  GERD, esophageal stricture, asthma, CAD, HIV, HLD, orthostatic hypotension. Type of Study: Bedside Swallow Evaluation Previous Swallow Assessment: no Diet Prior to this Study: Other (Comment) (full  liquids) Temperature Spikes Noted: No Respiratory Status: Room air History of Recent Intubation: No Behavior/Cognition: Alert;Cooperative Oral Care Completed by SLP: No Oral Cavity - Dentition: Edentulous Vision: Functional for self-feeding Self-Feeding Abilities: Total assist Patient Positioning: Upright in bed Baseline Vocal Quality: Normal Volitional Cough: Strong Volitional Swallow: Able to elicit    Oral/Motor/Sensory Function Overall Oral Motor/Sensory Function: Within functional limits   Ice Chips Ice chips: Within functional limits   Thin Liquid Thin Liquid: Within functional limits    Nectar Thick Nectar Thick Liquid: Not tested   Honey Thick Honey Thick Liquid: Not tested   Puree Puree: Within functional limits   Solid     Solid: Not tested      Juan Quam Laurice 11/15/2021,5:45 PM  Estill Bamberg L. Tivis Ringer, MA CCC/SLP Clinical Specialist - [place] Office number [phone]

## 2021-11-15 NOTE — Evaluation (Signed)
Physical Therapy Evaluation Patient Details Name: [name] [name] MRN: [id number] DOB: [birth year] Today's Date: 11/15/2021  History of Present Illness  [name] [name] is a 71 y.o. female presented to the ED for evaluation of nausea and vomiting--found to have stool burden. PHMx: pulmonary fibrosis on 2 L O2 prn qhs, asthma, CAD, HIV, HLD, orthostatic hypotension.  Clinical Impression   Pt presents with debility, impaired coordination Ues>Les, impaired balance with history of falls, max difficulty mobilizing, and decreased activity tolerance. Pt to benefit from acute PT to address deficits. Pt requiring max-total +2 for transfer-level mobility, and could not even come to full standing given LE weakness and fatigue. PT recommending ST-SNF level of care post-acutely, typically lives with daughter but is alone for a majority of the day. PT to progress mobility as tolerated, and will continue to follow acutely.         Recommendations for follow up therapy are one component of a multi-disciplinary discharge planning process, led by the attending physician.  Recommendations may be updated based on patient status, additional functional criteria and insurance authorization.  Follow Up Recommendations Skilled nursing-short term rehab (<3 hours/day) Can patient physically be transported by private vehicle: No    Assistance Recommended at Discharge Frequent or constant Supervision/Assistance  Patient can return home with the following  Two people to help with walking and/or transfers;Two people to help with bathing/dressing/bathroom;Assistance with feeding;Assistance with cooking/housework    Equipment Recommendations None recommended by PT  Recommendations for Other Services       Functional Status Assessment Patient has had a recent decline in their functional status and demonstrates the ability to make significant improvements in function in a reasonable and predictable amount of time.      Precautions / Restrictions Precautions Precautions: Fall Restrictions Weight Bearing Restrictions: No      Mobility  Bed Mobility Overal bed mobility: Needs Assistance Bed Mobility: Rolling, Supine to Sit, Sit to Supine Rolling: Min assist   Supine to sit: Max assist, +2 for physical assistance Sit to supine: Total assist, +2 for physical assistance   General bed mobility comments: VCs needs for all movements as well as physical A at legs and trunk due to ataxic movements    Transfers Overall transfer level: Needs assistance Equipment used: 2 person hand held assist Transfers: Sit to/from Stand Sit to Stand: Total assist, +2 physical assistance           General transfer comment: Pt only minimally able to clear buttocks off the bed.    Ambulation/Gait                  Stairs            Wheelchair Mobility    Modified Rankin (Stroke Patients Only)       Balance Overall balance assessment: Needs assistance Sitting-balance support: Bilateral upper extremity supported, Feet supported Sitting balance-Leahy Scale: Poor Sitting balance - Comments: requires truncal support   Standing balance support: Bilateral upper extremity supported Standing balance-Leahy Scale: Zero Standing balance comment: total +2                             Pertinent Vitals/Pain Pain Assessment Pain Assessment: Faces Faces Pain Scale: Hurts even more Pain Location: in legs when assisting her with moving them in bed and to EOB (reports its the whole leg) Pain Descriptors / Indicators: Aching, Sore Pain Intervention(s): Monitored during session, Limited activity within patient's tolerance,  Repositioned    Home Living Family/patient expects to be discharged to:: Private residence Living Arrangements: Children Available Help at Discharge: Family;Available 24 hours/day Type of Home: Apartment Home Access: Stairs to enter Entrance Stairs-Rails: Right Entrance  Stairs-Number of Steps: 14   Home Layout: One level Home Equipment: Rollator (4 wheels) Additional Comments: Lives with daughter and grandson    Prior Function Prior Level of Function : Needs assist;Patient poor historian/Family not available             Mobility Comments: Per pt she was able to go down steps with A of one person but needed to be lifted up the steps as of 2 weeks ago. Reports she could stand pivot with a RW up until 2 weeks ago, but has not really ambulated since April of this year.       Hand Dominance   Dominant Hand: Right    Extremity/Trunk Assessment   Upper Extremity Assessment Upper Extremity Assessment: Defer to OT evaluation RUE Deficits / Details: Ataxic movements RUE Sensation: decreased proprioception RUE Coordination: decreased fine motor;decreased gross motor LUE Deficits / Details: Ataxic movements LUE Sensation: decreased proprioception LUE Coordination: decreased fine motor;decreased gross motor    Lower Extremity Assessment Lower Extremity Assessment: Generalized weakness;RLE deficits/detail;LLE deficits/detail RLE Deficits / Details: low tone, <1/2 ROM in supine secondary to weakness RLE Sensation: decreased proprioception RLE Coordination: decreased gross motor;decreased fine motor LLE Deficits / Details: low tone, <1/2 ROM in supine secondary to weakness LLE Sensation: decreased proprioception LLE Coordination: decreased gross motor;decreased fine motor    Cervical / Trunk Assessment Cervical / Trunk Assessment: Normal  Communication   Communication: No difficulties (soft spoken at times)  Cognition Arousal/Alertness: Awake/alert Behavior During Therapy: WFL for tasks assessed/performed Overall Cognitive Status: No family/caregiver present to determine baseline cognitive functioning                                 General Comments: A & O x4; attempted to follow all movements asked of her        General  Comments General comments (skin integrity, edema, etc.): vss    Exercises     Assessment/Plan    PT Assessment Patient needs continued PT services  PT Problem List Decreased strength;Decreased mobility;Decreased balance;Decreased knowledge of use of DME;Decreased safety awareness;Decreased activity tolerance;Decreased range of motion;Decreased coordination;Impaired tone       PT Treatment Interventions DME instruction;Therapeutic activities;Gait training;Therapeutic exercise;Patient/family education;Balance training;Stair training;Functional mobility training;Neuromuscular re-education    PT Goals (Current goals can be found in the Care Plan section)  Acute Rehab PT Goals PT Goal Formulation: With patient Time For Goal Achievement: 11/29/21 Potential to Achieve Goals: Good    Frequency Min 2X/week     Co-evaluation PT/OT/SLP Co-Evaluation/Treatment: Yes Reason for Co-Treatment: For patient/therapist safety;To address functional/ADL transfers PT goals addressed during session: Mobility/safety with mobility;Balance OT goals addressed during session: Strengthening/ROM;ADL's and self-care       AM-PAC PT "6 Clicks" Mobility  Outcome Measure Help needed turning from your back to your side while in a flat bed without using bedrails?: A Lot Help needed moving from lying on your back to sitting on the side of a flat bed without using bedrails?: A Lot Help needed moving to and from a bed to a chair (including a wheelchair)?: Total Help needed standing up from a chair using your arms (e.g., wheelchair or bedside chair)?: Total Help needed to walk in hospital  room?: Total Help needed climbing 3-5 steps with a railing? : Total 6 Click Score: 8    End of Session   Activity Tolerance: Patient limited by fatigue Patient left: in bed;with bed alarm set;with call bell/phone within reach Nurse Communication: Mobility status PT Visit Diagnosis: Other abnormalities of gait and mobility  (R26.89);Muscle weakness (generalized) (M62.81)    Time: 3086-5784 PT Time Calculation (min) (ACUTE ONLY): 32 min   Charges:   PT Evaluation $PT Eval Low Complexity: 1 Low          Danaysia Rader S, PT DPT Acute Rehabilitation Services Pager [phone]  Office [phone]   [name] [name] 11/15/2021, 2:14 PM

## 2021-11-16 ENCOUNTER — Inpatient Hospital Stay (HOSPITAL_COMMUNITY): Payer: Medicare Other

## 2021-11-16 ENCOUNTER — Inpatient Hospital Stay: Payer: Self-pay

## 2021-11-16 DIAGNOSIS — R6521 Severe sepsis with septic shock: Secondary | ICD-10-CM

## 2021-11-16 DIAGNOSIS — Z21 Asymptomatic human immunodeficiency virus [HIV] infection status: Secondary | ICD-10-CM | POA: Diagnosis not present

## 2021-11-16 DIAGNOSIS — A419 Sepsis, unspecified organism: Secondary | ICD-10-CM

## 2021-11-16 DIAGNOSIS — J9601 Acute respiratory failure with hypoxia: Secondary | ICD-10-CM

## 2021-11-16 DIAGNOSIS — R112 Nausea with vomiting, unspecified: Secondary | ICD-10-CM | POA: Diagnosis not present

## 2021-11-16 LAB — CBC WITH DIFFERENTIAL/PLATELET
Abs Immature Granulocytes: 0.12 10*3/uL — ABNORMAL HIGH (ref 0.00–0.07)
Basophils Absolute: 0 10*3/uL (ref 0.0–0.1)
Basophils Relative: 0 %
Eosinophils Absolute: 0.1 10*3/uL (ref 0.0–0.5)
Eosinophils Relative: 1 %
HCT: 34 % — ABNORMAL LOW (ref 36.0–46.0)
Hemoglobin: 11.1 g/dL — ABNORMAL LOW (ref 12.0–15.0)
Immature Granulocytes: 1 %
Lymphocytes Relative: 12 %
Lymphs Abs: 1.3 10*3/uL (ref 0.7–4.0)
MCH: 32.2 pg (ref 26.0–34.0)
MCHC: 32.6 g/dL (ref 30.0–36.0)
MCV: 98.6 fL (ref 80.0–100.0)
Monocytes Absolute: 1.3 10*3/uL — ABNORMAL HIGH (ref 0.1–1.0)
Monocytes Relative: 12 %
Neutro Abs: 8.2 10*3/uL — ABNORMAL HIGH (ref 1.7–7.7)
Neutrophils Relative %: 74 %
Platelets: 127 10*3/uL — ABNORMAL LOW (ref 150–400)
RBC: 3.45 MIL/uL — ABNORMAL LOW (ref 3.87–5.11)
RDW: 16.5 % — ABNORMAL HIGH (ref 11.5–15.5)
Smear Review: NORMAL
WBC: 11 10*3/uL — ABNORMAL HIGH (ref 4.0–10.5)
nRBC: 0 % (ref 0.0–0.2)

## 2021-11-16 LAB — URINALYSIS, MICROSCOPIC (REFLEX): WBC, UA: >50 WBC/hpf (ref 0–5)

## 2021-11-16 LAB — URINALYSIS, ROUTINE W REFLEX MICROSCOPIC
Glucose, UA: NEGATIVE mg/dL
Ketones, ur: NEGATIVE mg/dL
Nitrite: POSITIVE — AB
Protein, ur: 100 mg/dL — AB
Specific Gravity, Urine: >1.03 — ABNORMAL HIGH (ref 1.005–1.030)
pH: 5.5 (ref 5.0–8.0)

## 2021-11-16 LAB — COMPREHENSIVE METABOLIC PANEL
ALT: 29 U/L (ref 0–44)
AST: 53 U/L — ABNORMAL HIGH (ref 15–41)
Albumin: <1.5 g/dL — ABNORMAL LOW (ref 3.5–5.0)
Alkaline Phosphatase: 48 U/L (ref 38–126)
Anion gap: 10 (ref 5–15)
BUN: 9 mg/dL (ref 8–23)
CO2: 14 mmol/L — ABNORMAL LOW (ref 22–32)
Calcium: 7.9 mg/dL — ABNORMAL LOW (ref 8.9–10.3)
Chloride: 115 mmol/L — ABNORMAL HIGH (ref 98–111)
Creatinine, Ser: 0.68 mg/dL (ref 0.44–1.00)
GFR, Estimated: >60 mL/min (ref >60)
Glucose, Bld: 117 mg/dL — ABNORMAL HIGH (ref 70–99)
Potassium: 3 mmol/L — ABNORMAL LOW (ref 3.5–5.1)
Sodium: 139 mmol/L (ref 135–145)
Total Bilirubin: 0.7 mg/dL (ref 0.3–1.2)
Total Protein: 3.8 g/dL — ABNORMAL LOW (ref 6.5–8.1)

## 2021-11-16 LAB — GLUCOSE, CAPILLARY
Glucose-Capillary: 122 mg/dL — ABNORMAL HIGH (ref 70–99)
Glucose-Capillary: 103 mg/dL — ABNORMAL HIGH (ref 70–99)
Glucose-Capillary: 91 mg/dL (ref 70–99)
Glucose-Capillary: 78 mg/dL (ref 70–99)
Glucose-Capillary: 81 mg/dL (ref 70–99)

## 2021-11-16 LAB — BLOOD GAS, VENOUS
Acid-base deficit: 11.3 mmol/L — ABNORMAL HIGH (ref 0.0–2.0)
Bicarbonate: 12.7 mmol/L — ABNORMAL LOW (ref 20.0–28.0)
O2 Saturation: 98.9 %
Patient temperature: 37
pCO2, Ven: 24 mmHg — ABNORMAL LOW (ref 44–60)
pH, Ven: 7.33 (ref 7.25–7.43)
pO2, Ven: 77 mmHg — ABNORMAL HIGH (ref 32–45)

## 2021-11-16 LAB — MAGNESIUM: Magnesium: 2.1 mg/dL (ref 1.7–2.4)

## 2021-11-16 LAB — LACTIC ACID, PLASMA
Lactic Acid, Venous: 7.8 mmol/L (ref 0.5–1.9)
Lactic Acid, Venous: 9 mmol/L (ref 0.5–1.9)

## 2021-11-16 LAB — MRSA NEXT GEN BY PCR, NASAL: MRSA by PCR Next Gen: NOT DETECTED

## 2021-11-16 LAB — PHOSPHORUS: Phosphorus: 2.1 mg/dL — ABNORMAL LOW (ref 2.5–4.6)

## 2021-11-16 MED ORDER — SODIUM CHLORIDE 0.9 % IV SOLN: 2.0000 g | Freq: Three times a day (TID) | INTRAVENOUS | Status: DC

## 2021-11-16 MED ORDER — SODIUM CHLORIDE 0.9 % IV SOLN: 1.0000 g | INTRAVENOUS | Status: DC

## 2021-11-16 MED ORDER — SODIUM CHLORIDE 0.9% FLUSH: 10.0000 mL | INTRAVENOUS | Status: DC | PRN

## 2021-11-16 MED ORDER — CHLORHEXIDINE GLUCONATE CLOTH 2 % EX PADS
6.0000 | MEDICATED_PAD | Freq: Every day | CUTANEOUS | Status: DC
  Administered 2021-11-16: 6 via TOPICAL

## 2021-11-16 MED ORDER — NOREPINEPHRINE 4 MG/250ML-% IV SOLN
2.0000 ug/min | INTRAVENOUS | Status: DC
  Administered 2021-11-16: 2 ug/min via INTRAVENOUS
  Filled 2021-11-16: qty 250

## 2021-11-16 MED ORDER — SODIUM CHLORIDE 0.9 % IV SOLN: INTRAVENOUS | Status: AC

## 2021-11-16 MED ORDER — LACTATED RINGERS IV BOLUS
1000.0000 mL | Freq: Once | INTRAVENOUS | Status: AC
  Administered 2021-11-16: 1000 mL via INTRAVENOUS

## 2021-11-16 MED ORDER — DEXTROSE 5 % IV SOLN
30.0000 mmol | Freq: Once | INTRAVENOUS | Status: AC
Start: 2021-11-16 — End: 2021-11-17
  Administered 2021-11-16: 30 mmol via INTRAVENOUS
  Filled 2021-11-16: qty 10

## 2021-11-16 MED ORDER — NOREPINEPHRINE 4 MG/250ML-% IV SOLN
0.0000 ug/min | INTRAVENOUS | Status: DC
  Administered 2021-11-16: 4 ug/min via INTRAVENOUS
  Administered 2021-11-17: 50 ug/min via INTRAVENOUS
  Filled 2021-11-16 (×5): qty 250

## 2021-11-16 MED ORDER — SODIUM CHLORIDE 0.9 % IV SOLN
100.0000 mg | Freq: Two times a day (BID) | INTRAVENOUS | Status: DC
  Administered 2021-11-16: 100 mg via INTRAVENOUS
  Filled 2021-11-16 (×2): qty 100

## 2021-11-16 MED ORDER — SODIUM CHLORIDE 0.9% FLUSH: 10.0000 mL | Freq: Two times a day (BID) | INTRAVENOUS | Status: DC

## 2021-11-16 MED ORDER — LINEZOLID 600 MG/300ML IV SOLN
600.0000 mg | Freq: Two times a day (BID) | INTRAVENOUS | Status: DC
  Administered 2021-11-16 – 2021-11-17 (×2): 600 mg via INTRAVENOUS
  Filled 2021-11-16 (×2): qty 300

## 2021-11-16 MED ORDER — CEFEPIME HCL 2 G IV SOLR
2.0000 g | Freq: Two times a day (BID) | INTRAVENOUS | Status: DC
  Administered 2021-11-16: 2 g via INTRAVENOUS
  Filled 2021-11-16: qty 12.5

## 2021-11-16 MED ORDER — ALBUMIN HUMAN 25 % IV SOLN
25.0000 g | Freq: Four times a day (QID) | INTRAVENOUS | Status: AC
  Administered 2021-11-16 – 2021-11-17 (×4): 25 g via INTRAVENOUS
  Filled 2021-11-16 (×4): qty 100

## 2021-11-16 MED ORDER — SODIUM CHLORIDE 0.9 % IV SOLN: INTRAVENOUS | Status: DC

## 2021-11-16 MED ORDER — NOREPINEPHRINE 4 MG/250ML-% IV SOLN: 0.0000 ug/min | INTRAVENOUS | Status: DC

## 2021-11-16 MED ORDER — OXYCODONE HCL 5 MG PO TABS: 5.0000 mg | ORAL_TABLET | Freq: Four times a day (QID) | ORAL | Status: DC | PRN

## 2021-11-16 MED ORDER — SODIUM CHLORIDE 0.9 % IV SOLN
250.0000 mL | INTRAVENOUS | Status: DC
  Administered 2021-11-16: 250 mL via INTRAVENOUS

## 2021-11-16 MED ORDER — POTASSIUM CHLORIDE CRYS ER 20 MEQ PO TBCR
40.0000 meq | EXTENDED_RELEASE_TABLET | Freq: Once | ORAL | Status: AC
  Administered 2021-11-16: 40 meq via ORAL
  Filled 2021-11-16: qty 2

## 2021-11-16 MED ORDER — POTASSIUM CHLORIDE 10 MEQ/100ML IV SOLN
10.0000 meq | INTRAVENOUS | Status: AC
  Administered 2021-11-16 (×4): 10 meq via INTRAVENOUS
  Filled 2021-11-16 (×4): qty 100

## 2021-11-16 MED ORDER — SODIUM BICARBONATE 8.4 % IV SOLN
100.0000 meq | Freq: Once | INTRAVENOUS | Status: AC
  Administered 2021-11-16: 100 meq via INTRAVENOUS
  Filled 2021-11-16: qty 100

## 2021-11-16 MED ORDER — INSULIN ASPART 100 UNIT/ML IJ SOLN
0.0000 [IU] | INTRAMUSCULAR | Status: DC
  Administered 2021-11-17: 2 [IU] via SUBCUTANEOUS

## 2021-11-16 MED ORDER — STERILE WATER FOR INJECTION IV SOLN
INTRAVENOUS | Status: DC
  Filled 2021-11-16 (×2): qty 1000

## 2021-11-16 NOTE — Progress Notes (Signed)
Peripherally Inserted Central Catheter Placement  The IV Nurse has discussed with the patient and/or persons authorized to consent for the patient, the purpose of this procedure and the potential benefits and risks involved with this procedure.  The benefits include less needle sticks, lab draws from the catheter, and the patient may be discharged home with the catheter. Risks include, but not limited to, infection, bleeding, blood clot (thrombus formation), and puncture of an artery; nerve damage and irregular heartbeat and possibility to perform a PICC exchange if needed/ordered by physician.  Alternatives to this procedure were also discussed.  Bard Power PICC patient education guide, fact sheet on infection prevention and patient information card has been provided to patient /or left at bedside.    PICC Placement Documentation  PICC Triple Lumen 37/16/96 Right Basilic 37 cm 0 cm (Active)  Indication for Insertion or Continuance of Line Limited venous access - need for IV therapy >5 days (PICC only) 11/16/21 1811  Exposed Catheter (cm) 0 cm 11/16/21 1811  Site Assessment Clean, Dry, Intact 11/16/21 1811  Lumen #1 Status Flushed;Saline locked;Blood return noted 11/16/21 1811  Lumen #2 Status Flushed;Saline locked;Blood return noted 11/16/21 1811  Lumen #3 Status Flushed;Saline locked;Blood return noted 11/16/21 1811  Dressing Type Transparent;Securing device 11/16/21 1811  Dressing Status Antimicrobial disc in place 11/16/21 1811  Dressing Intervention New dressing;Other (Comment) 11/16/21 1811  Dressing Change Due 11/23/21 11/16/21 1811   Telephone consent signed by Daughter    [name] 11/16/2021, 6:12 PM

## 2021-11-16 NOTE — Progress Notes (Signed)
Pharmacy Antibiotic Note  [name] [name] is a 71 y.o. female admitted on 11/19/2021 with sepsis and UTI.  Pharmacy has been consulted for cefepime dosing.   Pt with a hx of UTIs and HIV who was admitted for N/V. She has a hx of VRE in the urine in the past. Currently on ceftriaxone for UTI. Dapto ordered empirically for VRE but PNA might need coverage also. D/w Mickel Baas Gleason, we will use Zyvox instead. Patient is on Dovato for HIV. Now patient with septic shock. Pharmacy consulted to dose cefepime. Doxycycline added per MD with plans to continue linezolid. Scr 0.68 stable.   Plan: Cefepime 2g IV q12h Continue linezolid '600mg'$  IV q12h per MD  Continue doxycycline 100 mg IV q12h per MD  F/u blood cultures, renal function, s/sx clinical improvement, LOT    Height: '4\' 11"'$  (149.9 cm) Weight: 63 kg (138 lb 14.2 oz) IBW/kg (Calculated) : 43.2  Temp (24hrs), Avg:97.9 F (36.6 C), Min:97.4 F (36.3 C), Max:98.4 F (36.9 C)  Recent Labs  Lab 11/12/2021 1403 12/01/2021 1500 12/01/2021 1529 11/30/2021 1702 11/12/21 0428 11/13/21 0334 11/14/21 0406 11/15/21 0548 11/16/21 0552 11/16/21 1705  WBC  --   --  8.3  --  7.4 5.9 7.8  --  11.0*  --   CREATININE  --    < >  --   --  0.53 0.50 0.59 0.57 0.68  --   LATICACIDVEN 2.8*  --   --  1.6  --   --   --   --   --  7.8*   < > = values in this interval not displayed.    Estimated Creatinine Clearance: 52.8 mL/min (by C-G formula based on SCr of 0.68 mg/dL).    Allergies  Allergen Reactions   Isosorbide Nitrate Other (See Comments)    Headaches   Bactrim [Sulfamethoxazole-Trimethoprim] Rash   Sulfamethoxazole-Trimethoprim Hives and Rash    Antimicrobials this admission: 9/6 ceftriaxone (dose never given)  9/6 linezolid>> 9/6 doxycycline >> 9/6 cefepime >>   Dose adjustments this admission:   Microbiology results: 9/6 blood cx>> 9/6 - MRSA PCR -neg   Eliseo Gum, PharmD PGY1 Pharmacy Resident   11/16/2021  8:11 PM

## 2021-11-16 NOTE — Progress Notes (Signed)
Pharmacy Antibiotic Note  [name] [name] is a 71 y.o. female admitted on 11/21/2021 with sepsis and UTI.  Pharmacy has been consulted for daptomycin dosing.  Pt with a  hx of UTIs and HIV who was admitted for N/V. She has a hx of VRE in the urine in the past. Currently on ceftriaxone for UTI. Dapto ordered empirically for VRE but PNA might need coverage also. D/w [name] [name], we will use Zyvox instead. Patient is on Dovato for HIV.   Scr 0.68  Plan: Linezolid '600mg'$  IV q12  Height: '4\' 11"'$  (149.9 cm) Weight: 63 kg (138 lb 14.2 oz) IBW/kg (Calculated) : 43.2  Temp (24hrs), Avg:97.9 F (36.6 C), Min:97.4 F (36.3 C), Max:98.4 F (36.9 C)  Recent Labs  Lab 12/10/2021 1403 12/10/2021 1500 11/15/2021 1529 12/05/2021 1702 11/12/21 0428 11/13/21 0334 11/14/21 0406 11/15/21 0548 11/16/21 0552  WBC  --   --  8.3  --  7.4 5.9 7.8  --  11.0*  CREATININE  --    < >  --   --  0.53 0.50 0.59 0.57 0.68  LATICACIDVEN 2.8*  --   --  1.6  --   --   --   --   --    < > = values in this interval not displayed.    Estimated Creatinine Clearance: 52.8 mL/min (by C-G formula based on SCr of 0.68 mg/dL).    Allergies  Allergen Reactions   Isosorbide Nitrate Other (See Comments)    Headaches   Bactrim [Sulfamethoxazole-Trimethoprim] Rash   Sulfamethoxazole-Trimethoprim Hives and Rash    Antimicrobials this admission: 9/6 ceftriaxone>> 9/6 linezolid>>  Dose adjustments this admission:   Microbiology results: 9/6 blood>>   Onnie Boer, PharmD, BCIDP, AAHIVP, CPP Infectious Disease Pharmacist 11/16/2021 3:58 PM

## 2021-11-16 NOTE — Progress Notes (Addendum)
10:00 Patient flagged Yellow MEWs MD Rodena Piety notified.     12:30 Reached out to MD Rodena Piety about low BP and decreased urine output. UA sent. Blood cultures ordered. Will resume care.   2:54 pm patient bp 62/52 map 30, Notified MD about patient status. Orders received for bolus. Rapid Nurse notified, came to bedside to assess. CC team at bedside, assessed patient, orders to transfer to CCU.    16:21 Called report to [facility] CCU RN. All questions answered and patient transferred.

## 2021-11-16 NOTE — Progress Notes (Signed)
eLink Physician-Brief Progress Note Patient Name: [name] [name] DOB: 01-05-1951 MRN: [id number]   Date of Service  11/16/2021  HPI/Events of Note  ABG reviewed, lactic acid up to 9.0, compensated metabolic acidosis.  eICU Interventions  Will order a CT chest / abdomen / pelvis without contrast given the rapid increase in lactic acid without a clear cut explanation (and the GI tract symptoms).        Kerry Kass Sussan Meter 11/16/2021, 9:27 PM

## 2021-11-16 NOTE — Progress Notes (Signed)
PROGRESS NOTE    Zelena Delaina Fetsch  MEQ:[id number] DOB: [birth year] DOA: 12/02/2021 PCP: Pcp, No   Brief Narrative: 71 year old female with history of HIV, pulmonary fibrosis O2 dependent at 2 L, history of asthma, CAD and hyperlipidemia admitted with nausea and vomiting.  She does have chronic orthostatic hypotension and is on midodrine prior to admission.  She had complaint of 5 days of nausea and vomiting prior to admission.  She has history of gastric resection due to polyps. CT of the abdomen and pelvis with contrast shows increased stool in the rectum and proximal half of the colon.  Prior gastric resection with moderate to large hiatal hernia.  Fatty infiltration of liver and new cortical scarring in the upper pole of the right kidney.  No acute findings.  Fecal disimpaction was performed by ED physician.  CT of the lumbar spine no evidence of acute lumbar spine fracture. MRI of the brain negative for acute or subacute findings.  Assessment & Plan:   Principal Problem:   Nausea and vomiting Active Problems:   Constipation   Chronic respiratory failure with hypoxia (HCC) pulmonary fibrosis asthma   CAD (coronary artery disease)   HIV (human immunodeficiency virus infection) (HCC)   Muscle spasm   Muscle cramps    #1 chronic hypotension on midodrine prior to admission to the hospital. Her albumin is very low will transfuse albumin today Continue midodrine 10 mg twice daily Hold medications that make her sedated gabapentin, Ultram  #2 constipation check KUB patient was disimpacted by the ED physician continue MiraLAX and Senokot  #3 chronic hypoxic respiratory failure with pulmonary fibrosis on 2 L of oxygen at home.  Stable.  #4 history of HIV on Dovato  #5 hypocalcemia hypokalemia/hypomagnesemia/hypophosphatemia due to poor p.o. intake replete and recheck in a.m.  #6 history of CAD continue aspirin Plavix statin and Ranexa  #7 QT prolongation correct electrolytes and  avoid QT prolonging agents   #8 neuropathy on gabapentin will hold as she is very drowsy today  #9 urinary retention was on Flomax will hold due to soft BP  Estimated body mass index is 28.05 kg/m as calculated from the following:   Height as of this encounter: '4\' 11"'$  (1.499 m).   Weight as of this encounter: 63 kg.  DVT prophylaxis: Lovenox Code Status: Full code Family Communication: None at bedside  disposition Plan:  Status is: Inpatient Remains inpatient appropriate because: Intractable nausea vomiting hypotension   Consultants:  None  Procedures: None Antimicrobials: None  Subjective: She is resting in bed she was able to answer my questions appropriately and follow commands  However her later staff is concerned that she is too drowsy and her blood pressure was soft this was after getting the morning medications which includes gabapentin  Objective: Vitals:   11/16/21 0748 11/16/21 0813 11/16/21 0900 11/16/21 1200  BP:  95/76 (!) 76/58 (!) 84/44  Pulse:  (!) 103 99 98  Resp:  '16 16 16  '$ Temp:  (!) 97.4 F (36.3 C) 97.9 F (36.6 C) 97.9 F (36.6 C)  TempSrc:  Oral Axillary Oral  SpO2: 97% 99% 99% 97%  Weight:      Height:        Intake/Output Summary (Last 24 hours) at 11/16/2021 1229 Last data filed at 11/16/2021 0700 Gross per 24 hour  Intake 275 ml  Output 500 ml  Net -225 ml   Filed Weights   11/30/2021 1536  Weight: 63 kg    Examination:  General  exam: Appears chronically ill-appearing Respiratory system: Clear to auscultation. Respiratory effort normal. Cardiovascular system: S1 & S2 heard, RRR. No JVD, murmurs, rubs, gallops or clicks. No pedal edema. Gastrointestinal system: Abdomen is nondistended, soft and nontender. No organomegaly or masses felt. Normal bowel sounds heard. Central nervous system: Alert and oriented. No focal neurological deficits. Extremities: No edema.  Data Reviewed: I have personally reviewed following labs and imaging  studies  CBC: Recent Labs  Lab 11/14/2021 1529 11/23/2021 1919 11/12/21 0428 11/13/21 0334 11/14/21 0406 11/16/21 0552  WBC 8.3  --  7.4 5.9 7.8 11.0*  NEUTROABS 5.5  --   --  3.7 5.4 8.2*  HGB 13.1 12.2 12.1 11.3* 12.8 11.1*  HCT 38.2 36.0 36.1 33.7* 37.9 34.0*  MCV 95.7  --  97.3 98.3 97.4 98.6  PLT 260  --  232 177 209 914*   Basic Metabolic Panel: Recent Labs  Lab 11/12/21 0428 11/12/21 1008 11/13/21 0334 11/14/21 0406 11/15/21 0548 11/16/21 0552  NA 142  --  136 138 140 139  K 2.8*  --  3.1* 4.4 4.4 3.0*  CL 114*  --  112* 113* 116* 115*  CO2 23  --  17* 18* 14* 14*  GLUCOSE 84  --  103* 86 113* 117*  BUN 6*  --  6* 5* 5* 9  CREATININE 0.53  --  0.50 0.59 0.57 0.68  CALCIUM 8.2*  --  7.4* 7.9* 7.8* 7.9*  MG  --  1.7 2.1 1.7 1.6* 2.1  PHOS  --  2.9 2.0* 3.3 2.8 2.1*   GFR: Estimated Creatinine Clearance: 52.8 mL/min (by C-G formula based on SCr of 0.68 mg/dL). Liver Function Tests: Recent Labs  Lab 11/12/21 0428 11/13/21 0334 11/14/21 0406 11/15/21 0548 11/16/21 0552  AST 59* 77* 116* 72* 53*  ALT 30 29 39 29 29  ALKPHOS 43 39 46 48 48  BILITOT 1.0 0.6 0.8 0.5 0.7  PROT 4.6* 4.3* 4.7* 3.7* 3.8*  ALBUMIN 2.1* 1.8* 2.1* 1.6* <1.5*   Recent Labs  Lab 12/08/2021 1500  LIPASE 22   No results for input(s): "AMMONIA" in the last 168 hours. Coagulation Profile: No results for input(s): "INR", "PROTIME" in the last 168 hours. Cardiac Enzymes: No results for input(s): "CKTOTAL", "CKMB", "CKMBINDEX", "TROPONINI" in the last 168 hours. BNP (last 3 results) No results for input(s): "PROBNP" in the last 8760 hours. HbA1C: No results for input(s): "HGBA1C" in the last 72 hours. CBG: Recent Labs  Lab 11/15/21 1144 11/15/21 1641 11/15/21 2038 11/16/21 0815 11/16/21 1145  GLUCAP 154* 119* 120* 122* 103*   Lipid Profile: No results for input(s): "CHOL", "HDL", "LDLCALC", "TRIG", "CHOLHDL", "LDLDIRECT" in the last 72 hours. Thyroid Function Tests: No  results for input(s): "TSH", "T4TOTAL", "FREET4", "T3FREE", "THYROIDAB" in the last 72 hours. Anemia Panel: No results for input(s): "VITAMINB12", "FOLATE", "FERRITIN", "TIBC", "IRON", "RETICCTPCT" in the last 72 hours. Sepsis Labs: Recent Labs  Lab 11/27/2021 1403 11/30/2021 1702  LATICACIDVEN 2.8* 1.6    Recent Results (from the past 240 hour(s))  SARS Coronavirus 2 by RT PCR (hospital order, performed in [facility] hospital lab) *cepheid single result test* Anterior Nasal Swab     Status: None   Collection Time: 11/28/2021 12:06 PM   Specimen: Anterior Nasal Swab  Result Value Ref Range Status   SARS Coronavirus 2 by RT PCR NEGATIVE NEGATIVE Final    Comment: (NOTE) SARS-CoV-2 target nucleic acids are NOT DETECTED.  The SARS-CoV-2 RNA is generally detectable in upper  and lower respiratory specimens during the acute phase of infection. The lowest concentration of SARS-CoV-2 viral copies this assay can detect is 250 copies / mL. A negative result does not preclude SARS-CoV-2 infection and should not be used as the sole basis for treatment or other patient management decisions.  A negative result may occur with improper specimen collection / handling, submission of specimen other than nasopharyngeal swab, presence of viral mutation(s) within the areas targeted by this assay, and inadequate number of viral copies (<250 copies / mL). A negative result must be combined with clinical observations, patient history, and epidemiological information.  Fact Sheet for Patients:   https://www.patel.info/  Fact Sheet for Healthcare Providers: https://hall.com/  This test is not yet approved or  cleared by the [place] FDA and has been authorized for detection and/or diagnosis of SARS-CoV-2 by FDA under an Emergency Use Authorization (EUA).  This EUA will remain in effect (meaning this test can be used) for the duration of the COVID-19  declaration under Section 564(b)(1) of the Act, 21 U.S.C. section 360bbb-3(b)(1), unless the authorization is terminated or revoked sooner.  Performed at [facility], [place] [address], [place], [place] [address]          Radiology Studies: MR BRAIN W WO CONTRAST  Result Date: 11/15/2021 CLINICAL DATA:  Altered mental status, stroke suspected EXAM: MRI HEAD WITHOUT AND WITH CONTRAST TECHNIQUE: Multiplanar, multiecho pulse sequences of the brain and surrounding structures were obtained without and with intravenous contrast. CONTRAST:  26m GADAVIST GADOBUTROL 1 MMOL/ML IV SOLN COMPARISON:  No prior MRI, correlation is made with CT head 09/08/2021 FINDINGS: Evaluation is somewhat limited by motion artifact. Brain: No restricted diffusion to suggest acute or subacute infarct. On diffusion-weighted sequences, there is a focus of T2 shine through in the right posterior frontal lobe, which is without ADC correlate. No acute hemorrhage, mass, mass effect, or midline shift. Small focus of hemosiderin deposition in the right posterior temporal lobe (series 7, image 50), likely sequela of remote hypertensive microhemorrhage. Scattered and somewhat confluence T2 hyperintense signal in the periventricular white matter, likely the sequela of moderate chronic small vessel ischemic disease. No abnormal parenchymal or meningeal enhancement. Vascular: Normal arterial flow voids. Skull and upper cervical spine: Hyperostosis frontalis. Normal marrow signal. Sinuses/Orbits: No acute finding. Other: Fluid in the right mastoid air cells. IMPRESSION: No acute intracranial process. No evidence of acute or subacute infarct. No abnormal enhancement. Electronically Signed   By: AMerilyn BabaM.D.   On: 11/15/2021 19:01        Scheduled Meds:  aspirin EC  81 mg Oral Daily   atorvastatin  80 mg Oral QPM   clopidogrel  75 mg Oral Daily   dolutegravir-lamiVUDine  1 tablet Oral Daily   enoxaparin (LOVENOX) injection   40 mg Subcutaneous Q24H   fluticasone furoate-vilanterol  1 puff Inhalation Daily   gabapentin  200 mg Oral BID   midodrine  10 mg Oral BID WC   pantoprazole  40 mg Oral Daily   polyethylene glycol  17 g Oral BID   ranolazine  500 mg Oral BID   senna-docusate  1 tablet Oral BID   tamsulosin  0.4 mg Oral Daily   traZODone  50 mg Oral QHS   Continuous Infusions:  sodium chloride 100 mL/hr at 11/16/21 1116     LOS: 4 days   Time spent: 36 min  EGeorgette Shell MD 11/16/2021, 12:29 PM

## 2021-11-16 NOTE — Progress Notes (Signed)
Pt had YELLOW MEWS this am. Dr. Rodena Piety notified, [facility].      11/16/21 0813  Assess: MEWS Score  Temp (!) 97.4 F (36.3 C)  BP 95/76  MAP (mmHg) 84  Pulse Rate (!) 103  Resp 16  SpO2 99 %  O2 Device Room Air  Assess: MEWS Score  MEWS Temp 0  MEWS Systolic 1  MEWS Pulse 1  MEWS RR 0  MEWS LOC 0  MEWS Score 2  MEWS Score Color Yellow  Assess: if the MEWS score is Yellow or Red  Were vital signs taken at a resting state? Yes  Focused Assessment No change from prior assessment  Does the patient meet 2 or more of the SIRS criteria? No  MEWS guidelines implemented *See Row Information* Yes  Take Vital Signs  Increase Vital Sign Frequency  Yellow: Q 2hr X 2 then Q 4hr X 2, if remains yellow, continue Q 4hrs  Escalate  MEWS: Escalate Yellow: discuss with charge nurse/RN and consider discussing with provider and RRT  Notify: Charge Nurse/RN  Name of Charge Nurse/RN Notified Stanton Kidney  Date Charge Nurse/RN Notified 11/16/21  Time Charge Nurse/RN Notified 0940  Notify: Provider  Provider Name/Title Dr. Landis Gandy  Date Provider Notified 11/16/21  Time Provider Notified 0940  Method of Notification Page  Notification Reason  (Yellow MEWS)  Provider response No new orders  Date of Provider Response 11/16/21  Time of Provider Response 0940  Assess: SIRS CRITERIA  SIRS Temperature  0  SIRS Pulse 1  SIRS Respirations  0  SIRS WBC 1  SIRS Score Sum  2

## 2021-11-16 NOTE — Progress Notes (Signed)
Attempted to reach daughter via cell phone for PICC consent.  No answer.  Will attempt to call again later.  Manda, RN updated.

## 2021-11-16 NOTE — Progress Notes (Signed)
[facility] Progress Note Patient Name: [name] [name] DOB: [birth year] MRN: [id number]   Date of Service  11/16/2021  HPI/Events of Note  Serum lactate 7.8, consistent with overt septic shock. Patient has had 1 liter of crystalloid + 50 gm of 25 % albumin so far volume-wise.  eICU Interventions  Ceftriaxone discontinued and Cefepime substituted, Doxycycline added, continue Linezolid. LR 1000 ml iv fluid bolus  over 2 hours, obtain VBG.        [name] [name] 11/16/2021, 7:54 PM

## 2021-11-16 NOTE — TOC Initial Note (Addendum)
Transition of Care [name] [name]) - Initial/Assessment Note    Patient Details  Name: [name] [name] MRN: [id number] Date of Birth: [birth year]  Transition of Care [facility]) CM/SW Contact:    Curlene Labrum, RN Phone Number: 11/16/2021, 9:32 AM  Clinical Narrative:                 CM met with the patient at the bedside to discuss need for [name] [name] placement.  The patient was agreeable to short term placement and patient's clinicals to be faxed out in the hub for bed offers and choice by the patient.  The patient states that she was recently admitted to [name] in the past but would like to discuss bed offers and placement once available.  The patient currently has DME at home including wheelchar, RW and a cane and resides in an upstairs apartment with her daughter, [name] that works at [employer] during the daytime hours.  CM will continue to follow the patient for needed [name] placement.  11/16/2021 1432 - I met with the patient at the bedside to discuss Medicare choice for [name] placement and the patient deferred her choice to be selected by her daughter.  I called and left a message with the patient's daughter on her voicemail.  I will follow up.  Expected Discharge Plan: Skilled Nursing Facility Barriers to Discharge: Continued Medical Work up   Patient Goals and CMS Choice Patient states their goals for this hospitalization and ongoing recovery are:: To get better CMS Medicare.gov Compare Post Acute Care list provided to:: Patient Choice offered to / list presented to : Patient  Expected Discharge Plan and Services Expected Discharge Plan: [name]   Discharge Planning Services: CM Consult Post Acute Care Choice: [place] Living arrangements for the past 2 months: Apartment                                      Prior Living Arrangements/Services Living arrangements for the past 2 months: Apartment Lives with:: Adult Children Patient language  and need for interpreter reviewed:: Yes Do you feel safe going back to the place where you live?: Yes      Need for Family Participation in Patient Care: Yes (Comment) Care giver support system in place?: Yes (comment)   Criminal Activity/Legal Involvement Pertinent to Current Situation/Hospitalization: No - Comment as needed  Activities of Daily Living Home Assistive Devices/Equipment: Wheelchair, Environmental consultant (specify type), Cane (specify quad or straight) ADL Screening (condition at time of admission) Patient's cognitive ability adequate to safely complete daily activities?: Yes Is the patient deaf or have difficulty hearing?: No Does the patient have difficulty seeing, even when wearing glasses/contacts?: No Does the patient have difficulty concentrating, remembering, or making decisions?: No Patient able to express need for assistance with ADLs?: Yes Does the patient have difficulty dressing or bathing?: No Independently performs ADLs?: No Communication: Appropriate for developmental age Dressing (OT): Needs assistance Is this a change from baseline?: Pre-admission baseline Grooming: Needs assistance Is this a change from baseline?: Pre-admission baseline Feeding: Needs assistance Is this a change from baseline?: Pre-admission baseline Bathing: Needs assistance Is this a change from baseline?: Pre-admission baseline Toileting: Needs assistance Is this a change from baseline?: Pre-admission baseline In/Out Bed: Needs assistance Is this a change from baseline?: Pre-admission baseline Does the patient have difficulty walking or climbing stairs?: No Weakness of Legs: Both Weakness of Arms/Hands: Both  Permission Sought/Granted Permission sought to share information with : Case Manager, Customer service manager, Family Supports Permission granted to share information with : Yes, Verbal Permission Granted     Permission granted to share info w AGENCY: [facility] facilities for  placement  Permission granted to share info w Relationship: daughter [name] - [phone]     Emotional Assessment Appearance:: Appears stated age Attitude/Demeanor/Rapport: Gracious Affect (typically observed): Accepting Orientation: : Oriented to Self, Oriented to Place, Oriented to  Time, Oriented to Situation Alcohol / Substance Use: Not Applicable Psych Involvement: No (comment)  Admission diagnosis:  Lactic acidosis [E87.20] Nausea and vomiting [R11.2] Constipation, unspecified constipation type [K59.00] Nausea and vomiting, unspecified vomiting type [R11.2] Patient Active Problem List   Diagnosis Date Noted   Muscle spasm 11/12/2021   Muscle cramps 11/12/2021   Nausea and vomiting 12/03/2021   Sepsis due to pneumonia ([place]) 09/08/2021   CAP (community acquired pneumonia) 09/08/2021   Lactic acidosis 09/08/2021   Hypokalemia 09/08/2021   Pneumonia 09/08/2021   VRE infection (vancomycin resistant enterococcus), with multi-drug resistance 08/09/2021   Chronic diastolic CHF (congestive heart failure) ([place]) 08/02/2021   Impaired ambulation 08/01/2021   Palliative care encounter 07/31/2021   Severe sepsis ([name] [name]) 07/17/2021   Acute cystitis 07/17/2021   Generalized weakness 07/17/2021   HLD (hyperlipidemia) 07/17/2021   Vitamin B12 deficiency 06/12/2021   Macrocytic anemia 06/12/2021   Hypotension 06/12/2021   Orthostatic hypotension near syncope 06/11/2021   Elevated AST (SGOT) 06/11/2021   Chronic respiratory failure with hypoxia (HCC) pulmonary fibrosis asthma 06/11/2021   Near syncope 06/11/2021   Intractable nausea and vomiting 07/05/2019   Nausea & vomiting abdominal pain 09/04/2018   Recurrent UTI 09/04/2018   Nausea and/or vomiting 09/04/2018   Hypokalemia 04/22/2018   HIV (human immunodeficiency virus infection) ([name] [name]) 04/21/2018   Abdominal pain 04/21/2018   Anxiety state 03/17/2016   Benign neoplasm of colon 03/17/2016   Human  immunodeficiency virus (HIV) disease ([name] [name]) 03/17/2016   Nontoxic uninodular goiter 03/17/2016   Other premature beats 03/17/2016   Rectocele 03/17/2016   Symptomatic bradycardia 03/17/2016   Atypical chest pain 03/17/2016   Lower extremity edema 10/13/2015   Localized acrodermatitis continua of [place] 06/29/2015   Cervical pain (neck) 06/14/2015   Dysphagia 12/14/2014   IPF (idiopathic pulmonary fibrosis) ([place]) 07/06/2014   Paronychia 06/16/2014   Bone pain 08/25/2013   Lumbar radicular pain 08/25/2013   Allergic rhinitis 07/14/2013   Post-operative state 03/25/2013   Preop cardiovascular exam 12/24/2012   Ejection fraction    Cystocele 12/11/2012   Insomnia 10/23/2012   Vaginal prolapse 04/02/2012   Urinary incontinence 02/28/2012   Other benign neoplasm of connective and other soft tissue of lower limb, including hip 09/08/2011   Osteoporosis 03/20/2011   HIV positive ([place])    CAD (coronary artery disease)    Palpitations    Dyslipidemia    Persistent asthma    Peptic ulcer disease    GERD (gastroesophageal reflux disease)    Bradycardia    Shortness of breath    Constipation 07/22/2007   WEIGHT LOSS-ABNORMAL 07/22/2007   ABDOMINAL PAIN-GENERALIZED 07/22/2007   PCP:  Pcp, No Pharmacy:   [id number] [place] [facility] [address]Phone: [phone]: [phone] [place]Phone: [phone]: [phone] [facility]esTransitions of Care  Pharmacy [address] [place] [address] Phone: [phone] Fax: [phone]     Social Determinants of Health (SDOH) Interventions    Readmission Risk Interventions    11/16/2021    9:28 AM 07/20/2021    2:13 PM  Readmission Risk Prevention Plan  Transportation Screening Complete Complete  Home Care  Screening  Complete  Medication Review (RN Care Manager) Complete   PCP or Specialist appointment within 3-5 days of discharge Complete   HRI or [place] Complete   SW Recovery Care/Counseling Consult Complete   Palliative Care Screening Complete   Skilled Nursing Facility Complete

## 2021-11-16 NOTE — Progress Notes (Signed)
Patient admitted to 72[facility] from [facility]W with personal belongings including cell phone, phone charger, shirt, pants and hair brush in patient's belonging's bag.

## 2021-11-16 NOTE — Consult Note (Signed)
NAME:  [name] [name], MRN:  [id number], DOB:  1950-07-17, LOS: 4 ADMISSION DATE:  12/07/2021, CONSULTATION DATE:  9/623 REFERRING MD:  Zigmund Daniel, CHIEF COMPLAINT:  SOB   History of Present Illness:  71 year old woman with hx of HIV on ART (CD4 231 09/09/21, viral load 140 11/14/21), IPF on 2L HOT, asthma p/w N/V x 5 days on 12/05/2021.  Admitted initially for obs, imaging showed constipation and she was disimpacted in ER.  Changed to inpatient as she was unable to take PO.  Was being worked up for placement due to ambulatory failure when she began having worsening SOB.  Bps remain soft but she is on chronic midodrine for this. PCCM consulted for worsening respiratory issues and acute on chronic hypotension.  Pertinent  Medical History  idiopathic pulmonary fibrosis (biopsy proven 2010) on pirfenidone (Oct 2016-now) asthma, HFpEF, well-controlled HIV (CD4 220 07/2020), dysphagia 2/2 esophageal strictures/moderate hiatal hernia/Schatzki ring and CAD s/p PCI, prediabetes, osteoporosis   Significant Hospital Events: Including procedures, antibiotic start and stop dates in addition to other pertinent events   9/1 admit 9/6 PCCM consult  Interim History / Subjective:  Consult  Objective   Blood pressure 91/74, pulse (!) 116, temperature 98.4 F (36.9 C), temperature source Axillary, resp. rate (!) 24, height '4\' 11"'$  (1.499 m), weight 63 kg, SpO2 97 %.        Intake/Output Summary (Last 24 hours) at 11/16/2021 1517 Last data filed at 11/16/2021 0700 Gross per 24 hour  Intake 220 ml  Output 500 ml  Net -280 ml   Filed Weights   11/27/2021 1536  Weight: 63 kg   General:  pale, ill-appearing F, awake fatigued HEENT: MM pink/dry, pupils equal Neuro: awake, lethargic, nodding to answer questions  CV: s1s2 rrr, no m/r/g PULM:  scattered crackles, tachypneic  GI: soft, non-distended, non-tender Extremities: warm/dry, 2+ edema  Skin: no rashes or lesions   K low Albumin low Phos low AST mildly  up but improving WBC up Plts down Stable anemia  Resolved Hospital Problem list     Assessment & Plan:   Septic Shock secondary to UTI  Acute on chronic hypotension History of VRE UTI P: -transfer to ICU, at high risk for decompesation -UA positive for nitrite -start Linezolid and Ceftriaxone -send bcx2  -IVF -consider levo if map <65 despite IV fluids -increase midodrine to 10 mg tid -consider echo      Acute on chronic respiratory failure Pulmonary Fibrosis Asthma: on symbicort Currently stable on [place] P: -CXR read with possible LLL PNA, suspect mostly chronic changes 2/2 chronic lung disease, but covered with Linezolid and Ceftriaxone -wean fio2 for sats >92%; baseline o2 on 2 L Radium -continue breo ellipta   Borderline AGMA P: -bicarb gtt -lactic acid   Hx of CAD P: -continue plavix, statin, ranexa   HIV P: -continue Dovato   N/V Constipation: s/p fecal disimpaction P: -continue bowel regimen -monitor for bowel mvt's   Hypocalcemia, hypokalemia, hypomagnesemia, hypophosphatemia Prolonged QT P: -trend and replete electrolytes as needed -telemetry monitoring -avoid qtc prolonging agents   Best Practice (right click and "Reselect all SmartList Selections" daily)   Diet/type: Regular consistency (see orders) DVT prophylaxis: LMWH GI prophylaxis: PPI Lines: N/A Foley:  Yes, and it is still needed Code Status:  full code Last date of multidisciplinary goals of care discussion [Dr. Tacy Learn attempted to reach family with no answer]  Labs   CBC: Recent Labs  Lab 11/30/2021 1529 11/15/2021 1919 11/12/21 0428  11/13/21 0334 11/14/21 0406 11/16/21 0552  WBC 8.3  --  7.4 5.9 7.8 11.0*  NEUTROABS 5.5  --   --  3.7 5.4 8.2*  HGB 13.1 12.2 12.1 11.3* 12.8 11.1*  HCT 38.2 36.0 36.1 33.7* 37.9 34.0*  MCV 95.7  --  97.3 98.3 97.4 98.6  PLT 260  --  232 177 209 127*    Basic Metabolic Panel: Recent Labs  Lab 11/12/21 0428 11/12/21 1008 11/13/21 0334  11/14/21 0406 11/15/21 0548 11/16/21 0552  NA 142  --  136 138 140 139  K 2.8*  --  3.1* 4.4 4.4 3.0*  CL 114*  --  112* 113* 116* 115*  CO2 23  --  17* 18* 14* 14*  GLUCOSE 84  --  103* 86 113* 117*  BUN 6*  --  6* 5* 5* 9  CREATININE 0.53  --  0.50 0.59 0.57 0.68  CALCIUM 8.2*  --  7.4* 7.9* 7.8* 7.9*  MG  --  1.7 2.1 1.7 1.6* 2.1  PHOS  --  2.9 2.0* 3.3 2.8 2.1*   GFR: Estimated Creatinine Clearance: 52.8 mL/min (by C-G formula based on SCr of 0.68 mg/dL). Recent Labs  Lab 11/29/2021 1403 11/26/2021 1529 11/21/2021 1702 11/12/21 0428 11/13/21 0334 11/14/21 0406 11/16/21 0552  WBC  --    < >  --  7.4 5.9 7.8 11.0*  LATICACIDVEN 2.8*  --  1.6  --   --   --   --    < > = values in this interval not displayed.    Liver Function Tests: Recent Labs  Lab 11/12/21 0428 11/13/21 0334 11/14/21 0406 11/15/21 0548 11/16/21 0552  AST 59* 77* 116* 72* 53*  ALT 30 29 39 29 29  ALKPHOS 43 39 46 48 48  BILITOT 1.0 0.6 0.8 0.5 0.7  PROT 4.6* 4.3* 4.7* 3.7* 3.8*  ALBUMIN 2.1* 1.8* 2.1* 1.6* <1.5*   Recent Labs  Lab 12/09/2021 1500  LIPASE 22   No results for input(s): "AMMONIA" in the last 168 hours.  ABG    Component Value Date/Time   PHART 7.336 (L) 03/21/2016 1011   PCO2ART 36.5 03/21/2016 1011   PO2ART 128.0 (H) 03/21/2016 1011   HCO3 24.1 11/23/2021 1919   TCO2 25 11/12/2021 1919   ACIDBASEDEF 2.0 11/14/2021 1919   O2SAT 60 11/11/2021 1919     Coagulation Profile: No results for input(s): "INR", "PROTIME" in the last 168 hours.  Cardiac Enzymes: No results for input(s): "CKTOTAL", "CKMB", "CKMBINDEX", "TROPONINI" in the last 168 hours.  HbA1C: Hgb A1c MFr Bld  Date/Time Value Ref Range Status  09/09/2021 06:00 AM 5.1 4.8 - 5.6 % Final    Comment:    (NOTE)         Prediabetes: 5.7 - 6.4         Diabetes: >6.4         Glycemic control for adults with diabetes: <7.0     CBG: Recent Labs  Lab 11/15/21 1641 11/15/21 2038 11/16/21 0815 11/16/21 1145  11/16/21 1509  GLUCAP 119* 120* 122* 103* 91    Review of Systems:   Please see the history of present illness. All other systems reviewed and are negative    Past Medical History:  She,  has a past medical history of Asthma, Asthma exacerbation (04/21/2018), Bradycardia, CAD (coronary artery disease), Closed fracture of sacrum ([place]) (12/23/2010), Colon polyps, Dyslipidemia, GERD (gastroesophageal reflux disease), H/O: hysterectomy, Hiatal hernia, HIV positive ([place]), Hypertension, Influenza  due to identified novel influenza A virus with other respiratory manifestations (04/22/2018), Palpitations, Peptic ulcer disease, Pulmonary fibrosis ([place]), Shortness of breath, and Weight loss.   Surgical History:   Past Surgical History:  Procedure Laterality Date   ABDOMINAL HYSTERECTOMY     APPENDECTOMY     CARDIAC CATHETERIZATION N/A 03/21/2016   Procedure: Right/Left Heart Cath and Coronary Angiography;  Surgeon: Belva Crome, MD;  Location: [facility];  Service: Cardiovascular;  Laterality: N/A;   CHOLECYSTECTOMY     CORONARY STENT PLACEMENT  12/2017   GASTRECTOMY     HC OCCIPITAL NERVE BLOCK  08/01/2021   VENTRAL HERNIA REPAIR       Social History:   reports that she quit smoking about 25 years ago. Her smoking use included cigarettes. She has never used smokeless tobacco. She reports that she does not drink alcohol and does not use drugs.   Family History:  Her family history includes Bradycardia in her brother and sister; Coronary artery disease in an other family member; Diabetes in her mother; Heart attack in her mother; Heart disease in her mother; Hypertension in her mother; Stroke in her mother.   Allergies Allergies  Allergen Reactions   Isosorbide Nitrate Other (See Comments)    Headaches   Bactrim [Sulfamethoxazole-Trimethoprim] Rash   Sulfamethoxazole-Trimethoprim Hives and Rash     Home Medications  Prior to Admission medications   Medication Sig Start Date End  Date Taking? Authorizing Provider  acetaminophen (TYLENOL) 500 MG tablet Take 1,000 mg by mouth every 6 (six) hours as needed for mild pain.   Yes [provider]  albuterol (VENTOLIN HFA) 108 (90 Base) MCG/ACT inhaler Inhale 1-2 puffs into the lungs every 6 (six) hours as needed for shortness of breath. 08/21/21  Yes [provider]  aspirin EC 81 MG tablet Take 1 tablet (81 mg total) by mouth daily. 03/17/16  Yes Dorothy Spark, MD  atorvastatin (LIPITOR) 80 MG tablet Take 80 mg by mouth every evening. 08/17/21  Yes [provider]  BREO ELLIPTA 100-25 MCG/ACT AEPB Inhale 1 puff into the lungs daily. 08/21/21  Yes [provider]  budesonide-formoterol (SYMBICORT) 160-4.5 MCG/ACT inhaler Inhale 2 puffs into the lungs 2 (two) times daily.    Yes [provider]  clopidogrel (PLAVIX) 75 MG tablet Take 75 mg by mouth daily. 08/21/21  Yes [provider]  conjugated estrogens (PREMARIN) vaginal cream Place 4.62 Applicatorfuls vaginally every Wednesday. Pt only to use 1/4 of the applicator    Yes [provider]  [facility] D3 25 MCG (1000 UT) capsule Take 1,000 Units by mouth daily. 04/07/21  Yes [provider]  diclofenac Sodium (VOLTAREN) 1 % GEL Apply 2-3 g topically 3 (three) times daily as needed for pain. 07/29/21  Yes [provider]  docusate sodium (COLACE) 100 MG capsule Take 1 capsule (100 mg total) by mouth 2 (two) times daily. 09/11/21  Yes Donne Hazel, MD  DOVATO 50-300 MG tablet Take 1 tablet by mouth daily. 08/15/21  Yes [provider]  estradiol (ESTRACE) 0.1 MG/GM vaginal cream Place 1 Applicatorful vaginally 3 (three) times a week. 10/24/21  Yes [provider]  furosemide (LASIX) 20 MG tablet Take 20 mg by mouth daily as needed for edema. 07/20/21  Yes [provider]  gabapentin (NEURONTIN) 300 MG capsule Take 1 capsule (300 mg total) by mouth 2 (two) times daily. 09/11/21 11/12/21 Yes Donne Hazel, MD  lidocaine (LIDODERM) 5 %  Place 1 patch onto the skin daily. 07/28/21  Yes [provider]  methocarbamol (ROBAXIN) 500 MG tablet Take 500 mg by mouth every 6 (six) hours as needed for muscle spasms. 08/21/21  Yes [provider]  metoprolol succinate (TOPROL-XL) 25 MG 24 hr tablet Take 12.5 mg by mouth daily. 08/21/21  Yes [provider]  midodrine (PROAMATINE) 10 MG tablet Take 1 tablet (10 mg total) by mouth 2 (two) times daily with a meal. 06/17/21  Yes Domenic Polite, MD  nitroGLYCERIN (NITROSTAT) 0.4 MG SL tablet Place 0.4 mg under the tongue every 5 (five) minutes as needed for chest pain. 07/20/21  Yes [provider]  ondansetron (ZOFRAN) 4 MG tablet Take 4 mg by mouth every 8 (eight) hours as needed for nausea/vomiting. 08/21/21  Yes [provider]  pantoprazole (PROTONIX) 40 MG tablet Take 40 mg by mouth daily. 08/23/21  Yes [provider]  polyethylene glycol (MIRALAX / GLYCOLAX) 17 g packet Take 17 g by mouth 2 (two) times daily. 07/20/21  Yes Patrecia Pour, MD  ranolazine (RANEXA) 500 MG 12 hr tablet Take 500 mg by mouth 2 (two) times daily. 08/17/21  Yes [provider]  traMADol (ULTRAM) 50 MG tablet Take 1 tablet (50 mg total) by mouth every 6 (six) hours as needed for moderate pain. 09/11/21  Yes Donne Hazel, MD  traZODone (DESYREL) 50 MG tablet Take 50 mg by mouth at bedtime. 07/25/21  Yes [provider]  trospium (SANCTURA) 20 MG tablet Take 20 mg by mouth 2 (two) times daily. 07/20/21  Yes [provider]  prochlorperazine (COMPAZINE) 5 MG tablet Take 1 tablet (5 mg total) by mouth every 6 (six) hours as needed for nausea or vomiting. Patient not taking: Reported on 11/12/2021 09/11/21   Donne Hazel, MD  senna-docusate (SENOKOT-S) 8.6-50 MG tablet Take 1 tablet by mouth 2 (two) times daily. Patient not taking: Reported on 08/01/2021 07/20/21   Patrecia Pour, MD     Critical care time: 40  minutes     CRITICAL CARE Performed by: Otilio Carpen Anneke Cundy   Total critical care time: 40 minutes  Critical care time was exclusive of separately billable procedures and treating other patients.  Critical care was necessary to treat or prevent imminent or life-threatening deterioration.  Critical care was time spent personally by me on the following activities: development of treatment plan with patient and/or surrogate as well as nursing, discussions with consultants, evaluation of patient's response to treatment, examination of patient, obtaining history from patient or surrogate, ordering and performing treatments and interventions, ordering and review of laboratory studies, ordering and review of radiographic studies, pulse oximetry and re-evaluation of patient's condition.   Otilio Carpen Margrette Wynia, PA-C [facility] See Amion for pager If no response to pager , please call [phone] until 7pm After 7:00 pm call Elink  [phone]

## 2021-11-16 NOTE — NC FL2 (Signed)
[place] LEVEL OF CARE SCREENING TOOL     IDENTIFICATION  Patient Name: [name] [name] Birthdate: [birth year] Sex: female Admission Date (Current Location): 11/22/2021  [place] and [place] Number:  Kathleen Argue [id number] [place] and Address:  [facility] [place] [address], [place], [place] [address]      Provider Number: [id number]  Attending Physician Name and Address:  Georgette Shell, MD  Relative Name and Phone Number:  [name] - daughter - [phone]    Current Level of Care: Hospital Recommended Level of Care: [place] Prior Approval Number:    Date Approved/Denied: 07/04/21 PASRR Number: [id number]  Discharge Plan: SNF    Current Diagnoses: Patient Active Problem List   Diagnosis Date Noted   Muscle spasm 11/12/2021   Muscle cramps 11/12/2021   Nausea and vomiting 11/26/2021   Sepsis due to pneumonia ([place]) 09/08/2021   CAP (community acquired pneumonia) 09/08/2021   Lactic acidosis 09/08/2021   Hypokalemia 09/08/2021   Pneumonia 09/08/2021   VRE infection (vancomycin resistant enterococcus), with multi-drug resistance 08/09/2021   Chronic diastolic CHF (congestive heart failure) ([place]) 08/02/2021   Impaired ambulation 08/01/2021   Palliative care encounter 07/31/2021   Severe sepsis ([place]) 07/17/2021   Acute cystitis 07/17/2021   Generalized weakness 07/17/2021   HLD (hyperlipidemia) 07/17/2021   Vitamin B12 deficiency 06/12/2021   Macrocytic anemia 06/12/2021   Hypotension 06/12/2021   Orthostatic hypotension near syncope 06/11/2021   Elevated AST (SGOT) 06/11/2021   Chronic respiratory failure with hypoxia (HCC) pulmonary fibrosis asthma 06/11/2021   Near syncope 06/11/2021   Intractable nausea and vomiting 07/05/2019   Nausea & vomiting abdominal pain 09/04/2018   Recurrent UTI 09/04/2018   Nausea and/or vomiting 09/04/2018   Hypokalemia 04/22/2018   HIV (human  immunodeficiency virus infection) ([place]) 04/21/2018   Abdominal pain 04/21/2018   Anxiety state 03/17/2016   Benign neoplasm of colon 03/17/2016   Human immunodeficiency virus (HIV) disease ([place]) 03/17/2016   Nontoxic uninodular goiter 03/17/2016   Other premature beats 03/17/2016   Rectocele 03/17/2016   Symptomatic bradycardia 03/17/2016   Atypical chest pain 03/17/2016   Lower extremity edema 10/13/2015   Localized acrodermatitis continua of [place] 06/29/2015   Cervical pain (neck) 06/14/2015   Dysphagia 12/14/2014   IPF (idiopathic pulmonary fibrosis) ([place]) 07/06/2014   Paronychia 06/16/2014   Bone pain 08/25/2013   Lumbar radicular pain 08/25/2013   Allergic rhinitis 07/14/2013   Post-operative state 03/25/2013   Preop cardiovascular exam 12/24/2012   Ejection fraction    Cystocele 12/11/2012   Insomnia 10/23/2012   Vaginal prolapse 04/02/2012   Urinary incontinence 02/28/2012   Other benign neoplasm of connective and other soft tissue of lower limb, including hip 09/08/2011   Osteoporosis 03/20/2011   HIV positive (HCC)    CAD (coronary artery disease)    Palpitations    Dyslipidemia    Persistent asthma    Peptic ulcer disease    GERD (gastroesophageal reflux disease)    Bradycardia    Shortness of breath    Constipation 07/22/2007   WEIGHT LOSS-ABNORMAL 07/22/2007   ABDOMINAL PAIN-GENERALIZED 07/22/2007    Orientation RESPIRATION BLADDER Height & Weight     Self, Time, Situation, Place  Normal Indwelling catheter Weight: 63 kg Height:  '4\' 11"'$  (149.9 cm)  BEHAVIORAL SYMPTOMS/MOOD NEUROLOGICAL BOWEL NUTRITION STATUS      Incontinent Diet (Full Liquid diet)  AMBULATORY STATUS COMMUNICATION OF NEEDS Skin   Total Care Verbally Normal  Personal Care Assistance Level of Assistance  Bathing, Feeding, Dressing Bathing Assistance: Maximum assistance Feeding assistance: Limited assistance Dressing Assistance: Maximum assistance      Functional Limitations Info  Sight, Hearing, Speech Sight Info: Adequate Hearing Info: Adequate Speech Info: Adequate    SPECIAL CARE FACTORS FREQUENCY  PT (By licensed PT), OT (By licensed OT)     PT Frequency: 3-5 x per week OT Frequency: 3-5 x per week            Contractures Contractures Info: Not present    Additional Factors Info  Code Status, Allergies, Psychotropic Code Status Info: Full Code Allergies Info: Isosorbide, Bactrim, Sulfa Psychotropic Info: Trazodone         Current Medications (11/16/2021):  This is the current hospital active medication list Current Facility-Administered Medications  Medication Dose Route Frequency Provider Last Rate Last Admin   acetaminophen (TYLENOL) tablet 650 mg  650 mg Oral Q6H PRN Lenore Cordia, MD   650 mg at 11/15/21 6734   Or   acetaminophen (TYLENOL) suppository 650 mg  650 mg Rectal Q6H PRN Zada Finders R, MD       albuterol (PROVENTIL) (2.5 MG/3ML) 0.083% nebulizer solution 2.5 mg  2.5 mg Nebulization Q6H PRN Lenore Cordia, MD       aspirin EC tablet 81 mg  81 mg Oral Daily Zada Finders R, MD   81 mg at 11/16/21 0917   atorvastatin (LIPITOR) tablet 80 mg  80 mg Oral QPM Zada Finders R, MD   80 mg at 11/15/21 1704   bisacodyl (DULCOLAX) suppository 10 mg  10 mg Rectal Daily PRN Lenore Cordia, MD       clopidogrel (PLAVIX) tablet 75 mg  75 mg Oral Daily Zada Finders R, MD   75 mg at 11/16/21 0917   dextrose 5 %-0.9 % sodium chloride infusion   Intravenous Continuous Allie Bossier, MD 75 mL/hr at 11/15/21 2152 New Bag at 11/15/21 2152   dolutegravir-lamiVUDine (DOVATO) 50-300 MG per tablet 1 tablet  1 tablet Oral Daily Zada Finders R, MD   1 tablet at 11/15/21 0956   enoxaparin (LOVENOX) injection 40 mg  40 mg Subcutaneous Q24H Zada Finders R, MD   40 mg at 11/16/21 0916   fluticasone furoate-vilanterol (BREO ELLIPTA) 100-25 MCG/ACT 1 puff  1 puff Inhalation Daily Lenore Cordia, MD   1 puff at 11/16/21 0747    gabapentin (NEURONTIN) capsule 200 mg  200 mg Oral BID Allie Bossier, MD   200 mg at 11/16/21 0917   midodrine (PROAMATINE) tablet 10 mg  10 mg Oral BID WC Zada Finders R, MD   10 mg at 11/16/21 0916   ondansetron (ZOFRAN) tablet 4 mg  4 mg Oral Q6H PRN Zada Finders R, MD   4 mg at 11/12/21 1103   Or   ondansetron (ZOFRAN) injection 4 mg  4 mg Intravenous Q6H PRN Zada Finders R, MD   4 mg at 11/15/21 1733   pantoprazole (PROTONIX) EC tablet 40 mg  40 mg Oral Daily Zada Finders R, MD   40 mg at 11/16/21 0916   polyethylene glycol (MIRALAX / GLYCOLAX) packet 17 g  17 g Oral BID Zada Finders R, MD   17 g at 11/16/21 0916   ranolazine (RANEXA) 12 hr tablet 500 mg  500 mg Oral BID Zada Finders R, MD   500 mg at 11/16/21 1937   senna-docusate (Senokot-S) tablet 1 tablet  1 tablet Oral BID Posey Pronto,  Cleaster Corin, MD   1 tablet at 11/16/21 0917   tamsulosin (FLOMAX) capsule 0.4 mg  0.4 mg Oral Daily Allie Bossier, MD   0.4 mg at 11/16/21 0917   traMADol (ULTRAM) tablet 50 mg  50 mg Oral Q6H PRN Allie Bossier, MD   50 mg at 11/15/21 1033   traZODone (DESYREL) tablet 50 mg  50 mg Oral QHS Lenore Cordia, MD   50 mg at 11/13/21 2151     Discharge Medications: Please see discharge summary for a list of discharge medications.  Relevant Imaging Results:  Relevant Lab Results:   Additional Information SSN - [id number]  Curlene Labrum, RN

## 2021-11-16 NOTE — Progress Notes (Signed)
[place] Progress Note Patient Name: [name] [name] DOB: 03-Mar-1951 MRN: [id number]   Date of Service  11/16/2021  HPI/Events of Note  Patient with increasing shortness of breath following fluid resuscitation.  eICU Interventions  BIPAP ordered, ABG  on BIPAP.        Kerry Kass Shereta Crothers 11/16/2021, 10:38 PM

## 2021-11-17 ENCOUNTER — Inpatient Hospital Stay (HOSPITAL_COMMUNITY): Payer: Medicare Other

## 2021-11-17 DIAGNOSIS — R112 Nausea with vomiting, unspecified: Secondary | ICD-10-CM | POA: Diagnosis not present

## 2021-11-17 LAB — CBC WITH DIFFERENTIAL/PLATELET
Abs Immature Granulocytes: 0 10*3/uL (ref 0.00–0.07)
Abs Immature Granulocytes: 0 10*3/uL (ref 0.00–0.07)
Basophils Absolute: 0 10*3/uL (ref 0.0–0.1)
Basophils Absolute: 0 10*3/uL (ref 0.0–0.1)
Basophils Relative: 0 %
Basophils Relative: 0 %
Eosinophils Absolute: 0 10*3/uL (ref 0.0–0.5)
Eosinophils Absolute: 0 10*3/uL (ref 0.0–0.5)
Eosinophils Relative: 0 %
Eosinophils Relative: 0 %
HCT: 29.7 % — ABNORMAL LOW (ref 36.0–46.0)
HCT: 30.1 % — ABNORMAL LOW (ref 36.0–46.0)
Hemoglobin: 10.1 g/dL — ABNORMAL LOW (ref 12.0–15.0)
Hemoglobin: 10.5 g/dL — ABNORMAL LOW (ref 12.0–15.0)
Lymphocytes Relative: 22 %
Lymphocytes Relative: 36 %
Lymphs Abs: 0.3 10*3/uL — ABNORMAL LOW (ref 0.7–4.0)
Lymphs Abs: 0.3 10*3/uL — ABNORMAL LOW (ref 0.7–4.0)
MCH: 33.2 pg (ref 26.0–34.0)
MCH: 33.3 pg (ref 26.0–34.0)
MCHC: 34 g/dL (ref 30.0–36.0)
MCHC: 34.9 g/dL (ref 30.0–36.0)
MCV: 97.7 fL (ref 80.0–100.0)
MCV: 95.6 fL (ref 80.0–100.0)
Metamyelocytes Relative: 2 %
Monocytes Absolute: 0.1 10*3/uL (ref 0.1–1.0)
Monocytes Absolute: 0 10*3/uL — ABNORMAL LOW (ref 0.1–1.0)
Monocytes Relative: 6 %
Monocytes Relative: 0 %
Myelocytes: 2 %
Myelocytes: 2 %
Neutro Abs: 1 10*3/uL — ABNORMAL LOW (ref 1.7–7.7)
Neutro Abs: 0.5 10*3/uL — ABNORMAL LOW (ref 1.7–7.7)
Neutrophils Relative %: 70 %
Neutrophils Relative %: 60 %
Platelets: 75 10*3/uL — ABNORMAL LOW (ref 150–400)
Platelets: 60 10*3/uL — ABNORMAL LOW (ref 150–400)
RBC: 3.04 MIL/uL — ABNORMAL LOW (ref 3.87–5.11)
RBC: 3.15 MIL/uL — ABNORMAL LOW (ref 3.87–5.11)
RDW: 16.7 % — ABNORMAL HIGH (ref 11.5–15.5)
RDW: 16.5 % — ABNORMAL HIGH (ref 11.5–15.5)
WBC: 1.4 10*3/uL — CL (ref 4.0–10.5)
WBC: 0.8 10*3/uL — CL (ref 4.0–10.5)
nRBC: 1.4 % — ABNORMAL HIGH (ref 0.0–0.2)
nRBC: 2.5 % — ABNORMAL HIGH (ref 0.0–0.2)

## 2021-11-17 LAB — COMPREHENSIVE METABOLIC PANEL
ALT: 59 U/L — ABNORMAL HIGH (ref 0–44)
ALT: 65 U/L — ABNORMAL HIGH (ref 0–44)
AST: 193 U/L — ABNORMAL HIGH (ref 15–41)
AST: 256 U/L — ABNORMAL HIGH (ref 15–41)
Albumin: 2.3 g/dL — ABNORMAL LOW (ref 3.5–5.0)
Albumin: 2.7 g/dL — ABNORMAL LOW (ref 3.5–5.0)
Alkaline Phosphatase: 48 U/L (ref 38–126)
Alkaline Phosphatase: 46 U/L (ref 38–126)
Anion gap: 11 (ref 5–15)
Anion gap: 16 — ABNORMAL HIGH (ref 5–15)
BUN: 10 mg/dL (ref 8–23)
BUN: 10 mg/dL (ref 8–23)
CO2: 14 mmol/L — ABNORMAL LOW (ref 22–32)
CO2: 14 mmol/L — ABNORMAL LOW (ref 22–32)
Calcium: 7.6 mg/dL — ABNORMAL LOW (ref 8.9–10.3)
Calcium: 7.4 mg/dL — ABNORMAL LOW (ref 8.9–10.3)
Chloride: 112 mmol/L — ABNORMAL HIGH (ref 98–111)
Chloride: 105 mmol/L (ref 98–111)
Creatinine, Ser: 0.8 mg/dL (ref 0.44–1.00)
Creatinine, Ser: 0.76 mg/dL (ref 0.44–1.00)
GFR, Estimated: >60 mL/min (ref >60)
GFR, Estimated: >60 mL/min (ref >60)
Glucose, Bld: 165 mg/dL — ABNORMAL HIGH (ref 70–99)
Glucose, Bld: 136 mg/dL — ABNORMAL HIGH (ref 70–99)
Potassium: 4.5 mmol/L (ref 3.5–5.1)
Potassium: 4.2 mmol/L (ref 3.5–5.1)
Sodium: 137 mmol/L (ref 135–145)
Sodium: 135 mmol/L (ref 135–145)
Total Bilirubin: 1.3 mg/dL — ABNORMAL HIGH (ref 0.3–1.2)
Total Bilirubin: 1.5 mg/dL — ABNORMAL HIGH (ref 0.3–1.2)
Total Protein: 4 g/dL — ABNORMAL LOW (ref 6.5–8.1)
Total Protein: 4.1 g/dL — ABNORMAL LOW (ref 6.5–8.1)

## 2021-11-17 LAB — POCT I-STAT 7, (LYTES, BLD GAS, ICA,H+H)
Acid-base deficit: 12 mmol/L — ABNORMAL HIGH (ref 0.0–2.0)
Bicarbonate: 13.1 mmol/L — ABNORMAL LOW (ref 20.0–28.0)
Calcium, Ion: 1.1 mmol/L — ABNORMAL LOW (ref 1.15–1.40)
HCT: 29 % — ABNORMAL LOW (ref 36.0–46.0)
Hemoglobin: 9.9 g/dL — ABNORMAL LOW (ref 12.0–15.0)
O2 Saturation: 95 %
Patient temperature: 97.7
Potassium: 4.5 mmol/L (ref 3.5–5.1)
Sodium: 136 mmol/L (ref 135–145)
TCO2: 14 mmol/L — ABNORMAL LOW (ref 22–32)
pCO2 arterial: 24.9 mmHg — ABNORMAL LOW (ref 32–48)
pH, Arterial: 7.326 — ABNORMAL LOW (ref 7.35–7.45)
pO2, Arterial: 78 mmHg — ABNORMAL LOW (ref 83–108)

## 2021-11-17 LAB — LACTIC ACID, PLASMA
Lactic Acid, Venous: 8.3 mmol/L (ref 0.5–1.9)
Lactic Acid, Venous: 8.8 mmol/L (ref 0.5–1.9)

## 2021-11-17 LAB — GLUCOSE, CAPILLARY
Glucose-Capillary: 74 mg/dL (ref 70–99)
Glucose-Capillary: 163 mg/dL — ABNORMAL HIGH (ref 70–99)
Glucose-Capillary: 129 mg/dL — ABNORMAL HIGH (ref 70–99)
Glucose-Capillary: 163 mg/dL — ABNORMAL HIGH (ref 70–99)

## 2021-11-17 LAB — MAGNESIUM: Magnesium: 1.6 mg/dL — ABNORMAL LOW (ref 1.7–2.4)

## 2021-11-17 LAB — PHOSPHORUS: Phosphorus: 3.2 mg/dL (ref 2.5–4.6)

## 2021-11-17 MED ORDER — METHYLPREDNISOLONE SODIUM SUCC 125 MG IJ SOLR
125.0000 mg | Freq: Every day | INTRAMUSCULAR | Status: DC
  Administered 2021-11-17: 125 mg via INTRAVENOUS
  Filled 2021-11-17: qty 2

## 2021-11-17 MED ORDER — POLYVINYL ALCOHOL 1.4 % OP SOLN: 1.0000 [drp] | Freq: Four times a day (QID) | OPHTHALMIC | Status: DC | PRN

## 2021-11-17 MED ORDER — MORPHINE 100MG IN NS 100ML (1MG/ML) PREMIX INFUSION
0.0000 mg/h | INTRAVENOUS | Status: DC
  Administered 2021-11-17: 5 mg/h via INTRAVENOUS
  Filled 2021-11-17: qty 100

## 2021-11-17 MED ORDER — MORPHINE BOLUS VIA INFUSION
5.0000 mg | INTRAVENOUS | Status: DC | PRN
  Administered 2021-11-17: 5 mg via INTRAVENOUS

## 2021-11-17 MED ORDER — SODIUM BICARBONATE 8.4 % IV SOLN: 100.0000 meq | Freq: Once | INTRAVENOUS | Status: AC

## 2021-11-17 MED ORDER — GLYCOPYRROLATE 1 MG PO TABS: 1.0000 mg | ORAL_TABLET | ORAL | Status: DC | PRN

## 2021-11-17 MED ORDER — SODIUM CHLORIDE 0.9 % IV SOLN: INTRAVENOUS | Status: DC

## 2021-11-17 MED ORDER — VASOPRESSIN 20 UNITS/100 ML INFUSION FOR SHOCK
0.0000 [IU]/min | INTRAVENOUS | Status: DC
  Administered 2021-11-17: 0.03 [IU]/min via INTRAVENOUS
  Filled 2021-11-17: qty 100

## 2021-11-17 MED ORDER — MAGNESIUM SULFATE 4 GM/100ML IV SOLN
4.0000 g | Freq: Once | INTRAVENOUS | Status: AC
  Administered 2021-11-17: 4 g via INTRAVENOUS
  Filled 2021-11-17: qty 100

## 2021-11-17 MED ORDER — SODIUM BICARBONATE 8.4 % IV SOLN: 50.0000 meq | Freq: Once | INTRAVENOUS | Status: AC

## 2021-11-17 MED ORDER — ACETAMINOPHEN 650 MG RE SUPP: 650.0000 mg | Freq: Four times a day (QID) | RECTAL | Status: DC | PRN

## 2021-11-17 MED ORDER — GLYCOPYRROLATE 0.2 MG/ML IJ SOLN: 0.2000 mg | INTRAMUSCULAR | Status: DC | PRN

## 2021-11-17 MED ORDER — LACTATED RINGERS IV BOLUS
1000.0000 mL | Freq: Once | INTRAVENOUS | Status: AC
  Administered 2021-11-17: 1000 mL via INTRAVENOUS

## 2021-11-17 MED ORDER — ACETAMINOPHEN 325 MG PO TABS: 650.0000 mg | ORAL_TABLET | Freq: Four times a day (QID) | ORAL | Status: DC | PRN

## 2021-11-17 MED ORDER — GLYCOPYRROLATE 0.2 MG/ML IJ SOLN
0.2000 mg | INTRAMUSCULAR | Status: DC | PRN
  Filled 2021-11-17: qty 1

## 2021-11-17 MED ORDER — SODIUM BICARBONATE 8.4 % IV SOLN
INTRAVENOUS | Status: AC
  Administered 2021-11-17: 100 meq via INTRAVENOUS
  Filled 2021-11-17: qty 100

## 2021-11-17 MED ORDER — SODIUM CHLORIDE 0.9 % IV SOLN: INTRAVENOUS | Status: DC | PRN

## 2021-11-17 MED ORDER — SODIUM BICARBONATE 8.4 % IV SOLN
INTRAVENOUS | Status: AC
  Administered 2021-11-17: 50 meq via INTRAVENOUS
  Filled 2021-11-17: qty 50

## 2021-11-21 LAB — CULTURE, BLOOD (ROUTINE X 2)
Culture: NO GROWTH
Culture: NO GROWTH
Special Requests: ADEQUATE

## 2021-12-11 NOTE — Progress Notes (Signed)
eLink Physician-Brief Progress Note Patient Name: Perris Tripathi DOB: 01/06/1951 MRN: [id number]   Date of Service  11-30-21  HPI/Events of Note  Patient hypotensive and somnolent on BIPAP.  eICU Interventions  Vasopressin gtt ordered, Bicarb 1 amp iv push ordered, Blood pressure now 91/56, PCCM ground crew arrived to evaluate patient for possible intubation.        Kerry Kass Baird Polinski Nov 30, 2021, 3:56 AM

## 2021-12-11 NOTE — Progress Notes (Signed)
   2021/11/20 0820  Clinical Encounter Type  Visited With Patient and family together  Visit Type Initial;Death  Referral From Nurse  Consult/Referral To Chaplain   Chaplain responded to a support call for end of life for a patient. Family was grieving appropriately We said a prayer and shared some memories.  Chaplain provided a patient care card to family for identifying the funeral home to the hospital.   [facility]  [phone]

## 2021-12-11 NOTE — Progress Notes (Addendum)
eLink Physician-Brief Progress Note Patient Name: [name] [name] DOB: 1950/07/31 MRN: [id number]   Date of Service  2021/11/26  HPI/Events of Note  Lactic acid 8.8 despite optimal volume resuscitation for her degree of chronic pulmonary fibrosis, CT chest / abdomen / pelvis does not indicate necrosis / acute abdomen anywhere, LV function was normal in April.  eICU Interventions  Will order a stat echocardiogram this morning to try to check LV function, and determine hemodynamic subset. Will trend  lactic acid level.        Kerry Kass Allyn Bertoni 11-26-21, 6:24 AM

## 2021-12-11 NOTE — Progress Notes (Signed)
Cross covering ICU physician  Called to bedside for pt's worsening hypotension and hypoxemia on vasopressors. Discussed with pt options as she has pulmonary fibrosis at baseline on oxygen at home with acute pneumonia. Pt states that she would not want intubation. Called pt's family, daughter Ms. Ronie Spies, she endorses that she is aware that her mother has stated that she would not want resuscitation or intubation.   At this time we will continue with niv to support pt, family is en route to be with pt. Cont with vasopressor support as able.   Change code status to DNR/DNI

## 2021-12-11 NOTE — Progress Notes (Signed)
[facility] ADULT ICU REPLACEMENT PROTOCOL   The patient does apply for the [facility] Adult ICU Electrolyte Replacment Protocol based on the criteria listed below:   1.Exclusion criteria: TCTS patients, ECMO patients, and Dialysis patients 2. Is GFR >/= 30 ml/min? Yes.    Patient's GFR today is >60 3. Is SCr </= 2? Yes.   Patient's SCr is 0.76 mg/dL 4. Did SCr increase >/= 0.5 in 24 hours? No. 5.Pt's weight >40kg  Yes.   6. Abnormal electrolyte(s): mag 1.6  7. Electrolytes replaced per protocol 8.  Call MD STAT for K+ </= 2.5, Phos </= 1, or Mag </= 1 Physician:  n/a  [name] [name] 12/11/2021 6:05 AM

## 2021-12-11 NOTE — Progress Notes (Signed)
A-line attempted L radial. Poor peripheral pulse noted bilat. With assistance of dop unable to thread cath adequately even w/blood flow. Second attempt not made due to inablility to find audible pulse w/dop/

## 2021-12-11 NOTE — Progress Notes (Signed)
11-30-21   I have seen and evaluated the patient for end of life care.  S:  Terminal decline overnight. Agonally breathing, mottled, on BIPAP.  O: Blood pressure (!) 78/46, pulse (!) 126, temperature (!) 93 F (33.9 C), temperature source Axillary, resp. rate 20, height '4\' 11"'$  (1.499 m), weight 63 kg, SpO2 91 %.  Ill appearing woman on BIPAP Pooling secretions +accessory muscle use Unresponsive Rhonci bilaterally  A:  Multiorgan failure from with presumed HCAP vs. Aspiration Profound septic shock Pulmonary fibrosis on HOT DNR  Progressive deterioration overnight now in non-survivable state  P:  - Daughters coming in - Start morphine drip for RR < 18 and/or signs of air hunger - When daughters get here, stop pressors - In hospital death expected  32 min cc time  Erskine Emery MD [facility] epic messenger for cross cover needs If after hours, please call E-link

## 2021-12-11 NOTE — Death Summary Note (Signed)
DEATH SUMMARY   Patient Details  Name: [name] [name] MRN: [id number] DOB: [birth year]  Admission/Discharge Information   Admit Date:  2021/11/15  Date of Death:  2021-11-21  Time of Death:  0815  Length of Stay: 5  Referring Physician: Pcp, No   Reason(s) for Hospitalization   Cause of death: pulmonary fibrosis Est duration: 10 years  Acute hypoxemic respiratory failure due to aspiration vs. HCAP vs. acute IPF exacerbation Profound septic shock HIV on ART IPF Chronic hypotension Frail elderly HFpEF Dysphagia 2/2 esophageal strictures/moderate hiatal hernia/Schatzki ring CAD s/p PCI Osteoporosis  Brief Hospital Course (including significant findings, care, treatment, and services provided and events leading to death)  71 year old woman with hx of HIV on ART (CD4 231 09/09/21, viral load 140 11/14/21), IPF on 2L HOT, asthma p/w N/V x 5 days on 11/15/21.  Admitted initially for obs, imaging showed constipation and she was disimpacted in ER.  Changed to inpatient as she was unable to take PO.  Was being worked up for placement due to ambulatory failure when she began having worsening SOB.  Bps remain soft but she is on chronic midodrine for this. PCCM consulted for worsening respiratory issues and acute on chronic hypotension.  Patient developed progressive respiratory failure and shock.  She did not want life support and was transitioned to comfort when family arrived.  She passed peacefully with them at bedside.  Pertinent Labs and Studies  Significant Diagnostic Studies CT CHEST ABDOMEN PELVIS WO CONTRAST  Result Date: 2021/11/21 CLINICAL DATA:  Sepsis EXAM: CT CHEST, ABDOMEN AND PELVIS WITHOUT CONTRAST TECHNIQUE: Multidetector CT imaging of the chest, abdomen and pelvis was performed following the standard protocol without IV contrast. RADIATION DOSE REDUCTION: This exam was performed according to the departmental dose-optimization program which includes automated exposure control,  adjustment of the mA and/or kV according to patient size and/or use of iterative reconstruction technique. COMPARISON:  CT chest 08/28/2019, CT abdomen pelvis 11/15/21 FINDINGS: CT CHEST FINDINGS Cardiovascular: Long segment left anterior descending coronary artery stenting has been performed. Global cardiac size within normal limits. No pericardial effusion. Central pulmonary arteries are of normal caliber. Mild atherosclerotic calcification within the thoracic aorta. No aortic aneurysm. Right upper extremity PICC line tip noted within the superior cavoatrial junction. Mediastinum/Nodes: Visualized thyroid is unremarkable. No pathologic thoracic adenopathy. Moderate hiatal hernia. Esophagus is unremarkable. Lungs/Pleura: Extensive multifocal ground-glass and consolidative infiltrates are seen within the lungs bilaterally, more focal within the right middle lobe, in keeping with multifocal infection in the appropriate clinical setting. Superimposed interlobular and intra lobular septal thickening, and subpleural reticulation is identified in keeping with at least mild pulmonary fibrosis, stable since prior examination. Trace bilateral pleural effusions are now present. No pneumothorax. No central obstructing lesion. Musculoskeletal: No acute bone abnormality. No lytic or blastic bone lesion. CT ABDOMEN PELVIS FINDINGS Hepatobiliary: No focal liver abnormality is seen. Status post cholecystectomy. No biliary dilatation. Pancreas: Atrophic but otherwise unremarkable. Spleen: Unremarkable Adrenals/Urinary Tract: Adrenal glands are unremarkable. Kidneys are normal, without renal calculi, focal lesion, or hydronephrosis. Bladder is decompressed with a Foley catheter balloon seen within its lumen. Stomach/Bowel: Mild circumferential bowel wall thickening is seen involving the ascending colon, new since prior examination, possibly reflecting an infectious or inflammatory colitis. No evidence of obstruction or  perforation. No free intraperitoneal gas or fluid. Surgical changes of probable Billroth 1 procedure are identified. The stomach, small bowel, and large bowel are otherwise unremarkable. The appendix is absent. Vascular/Lymphatic: Aortic atherosclerosis. No enlarged abdominal  or pelvic lymph nodes. Reproductive: Status post hysterectomy. No adnexal masses. Other: There is interval development of subcutaneous body wall edema, most prevalent within the flanks and visualized thighs bilaterally. Tiny fat containing right inguinal hernia. Musculoskeletal: No acute bone abnormality. No lytic or blastic bone lesion. IMPRESSION: 1. Multifocal pulmonary infiltrates, more focal within the right middle lobe, in keeping with multifocal infection in the appropriate clinical setting. 2. Superimposed mild pulmonary fibrosis, stable since prior examination. 3. Interval development of trace bilateral pleural effusions and subcutaneous body wall edema, reflecting changes of volume overload or anasarca 4. Mild circumferential bowel wall thickening involving the ascending colon, new since prior examination, possibly reflecting an infectious or inflammatory colitis. No evidence of obstruction or perforation. Aortic Atherosclerosis (ICD10-I70.0). Electronically Signed   By: Fidela Salisbury M.D.   On: 12/14/21 02:56   DG CHEST PORT 1 VIEW  Result Date: 11/16/2021 CLINICAL DATA:  PICC placement. EXAM: PORTABLE CHEST 1 VIEW COMPARISON:  Earlier today. FINDINGS: New right upper extremity PICC tip overlies the lower SVC. No pneumothorax. Stable cardiomegaly. Unchanged mediastinal contours. Patchy multifocal airspace opacities, similar to earlier today. No pleural fluid. IMPRESSION: 1. Tip of the right upper extremity PICC in the lower SVC. No pneumothorax. 2. Unchanged patchy multifocal airspace opacities suspicious for multifocal pneumonia. Electronically Signed   By: Keith Rake M.D.   On: 11/16/2021 18:59   [place] EKG SITE  RITE  Result Date: 11/16/2021 If Site Rite image not attached, placement could not be confirmed due to current cardiac rhythm.  DG Chest 1 View  Result Date: 11/16/2021 CLINICAL DATA:  Shortness of breath EXAM: CHEST  1 VIEW COMPARISON:  Previous studies including the examination of 11/30/2021 FINDINGS: Transverse diameter of heart is increased. Coronary artery stent is seen. There is interval appearance of patchy interstitial densities in perihilar regions and lower lung fields, more so in the left lower lung field. Costophrenic angles are clear. There is no pneumothorax. Surgical clips are seen in upper abdomen. IMPRESSION: There is interval appearance of increased interstitial markings in the parahilar regions and lower lung fields, more so in the left lower lung field suggesting multifocal pneumonia. Coronary artery disease. Electronically Signed   By: Elmer Picker M.D.   On: 11/16/2021 15:34   DG Abd 1 View  Result Date: 11/16/2021 CLINICAL DATA:  71 year old female with pain. EXAM: ABDOMEN - 1 VIEW COMPARISON:  12/04/2021 FINDINGS: Mild scattered gaseous distension of the small large bowel. Rectal stool ball is visualized. Surgical clips in the right upper quadrant. Surgical suture material at the mid abdomen, unchanged. No radio-opaque calculi or other significant radiographic abnormality are seen. IMPRESSION: Nonspecific bowel gas pattern. Electronically Signed   By: Ruthann Cancer M.D.   On: 11/16/2021 14:18   MR BRAIN W WO CONTRAST  Result Date: 11/15/2021 CLINICAL DATA:  Altered mental status, stroke suspected EXAM: MRI HEAD WITHOUT AND WITH CONTRAST TECHNIQUE: Multiplanar, multiecho pulse sequences of the brain and surrounding structures were obtained without and with intravenous contrast. CONTRAST:  33m GADAVIST GADOBUTROL 1 MMOL/ML IV SOLN COMPARISON:  No prior MRI, correlation is made with CT head 09/08/2021 FINDINGS: Evaluation is somewhat limited by motion artifact. Brain: No  restricted diffusion to suggest acute or subacute infarct. On diffusion-weighted sequences, there is a focus of T2 shine through in the right posterior frontal lobe, which is without ADC correlate. No acute hemorrhage, mass, mass effect, or midline shift. Small focus of hemosiderin deposition in the right posterior temporal lobe (series 7, image 50),  likely sequela of remote hypertensive microhemorrhage. Scattered and somewhat confluence T2 hyperintense signal in the periventricular white matter, likely the sequela of moderate chronic small vessel ischemic disease. No abnormal parenchymal or meningeal enhancement. Vascular: Normal arterial flow voids. Skull and upper cervical spine: Hyperostosis frontalis. Normal marrow signal. Sinuses/Orbits: No acute finding. Other: Fluid in the right mastoid air cells. IMPRESSION: No acute intracranial process. No evidence of acute or subacute infarct. No abnormal enhancement. Electronically Signed   By: Merilyn Baba M.D.   On: 11/15/2021 19:01   CT LUMBAR SPINE WO CONTRAST  Result Date: 11/14/2021 CLINICAL DATA:  Low back pain, > 4 wks, no prior imaging (Ped 0-17y) Patient with fall and now subsequent bilateral lower extremity weakness and foot drop EXAM: CT LUMBAR SPINE WITHOUT CONTRAST TECHNIQUE: Multidetector CT imaging of the lumbar spine was performed without intravenous contrast administration. Multiplanar CT image reconstructions were also generated. RADIATION DOSE REDUCTION: This exam was performed according to the departmental dose-optimization program which includes automated exposure control, adjustment of the mA and/or kV according to patient size and/or use of iterative reconstruction technique. COMPARISON:  CT 11/27/2021 FINDINGS: Segmentation: Partially sacralized L5. Alignment: Normal. Vertebrae: No acute lumbar spine fracture. Paraspinal and other soft tissues: Prior cholecystectomy. Scattered atherosclerosis. No other significant findings. Disc levels: Mild  disc bulging at L3-L4 and L4-L5 with ligament flavum thickening facet arthropathy results in no significant canal stenosis at L3-L4 with a least mild spinal canal stenosis at L4-L5. There is mild bilateral neural foraminal narrowing, right greater than left at L4-L5. IMPRESSION: No evidence of acute lumbar spine fracture. Multilevel degenerative changes most prominent at L3-L4 and L4-L5. Mild spinal canal stenosis and mild right greater left neural foraminal stenosis at L4-L5. Electronically Signed   By: Maurine Simmering M.D.   On: 11/14/2021 13:14   CT ABDOMEN PELVIS W CONTRAST  Result Date: 11/20/2021 CLINICAL DATA:  Abdominal pain, vomiting for 6 days, fever, decreased p.o. intake, increased weakness; history pulmonary fibrosis, GERD, coronary artery disease, asthma, HIV EXAM: CT ABDOMEN AND PELVIS WITH CONTRAST TECHNIQUE: Multidetector CT imaging of the abdomen and pelvis was performed using the standard protocol following bolus administration of intravenous contrast. RADIATION DOSE REDUCTION: This exam was performed according to the departmental dose-optimization program which includes automated exposure control, adjustment of the mA and/or kV according to patient size and/or use of iterative reconstruction technique. CONTRAST:  175m OMNIPAQUE IOHEXOL 300 MG/ML SOLN IV. No oral contrast. COMPARISON:  07/18/2021 FINDINGS: Lower chest: Bibasilar pulmonary fibrosis.  No acute infiltrate. Hepatobiliary: Gallbladder surgically absent. Fatty infiltration of liver. No focal hepatic mass. Pancreas: Atrophic pancreas Spleen: Normal appearance Adrenals/Urinary Tract: Adrenal glands normal appearance. Cortical scar at upper pole RIGHT kidney image 22 new since 07/18/2021. Kidneys, ureters, and bladder otherwise normal appearance. No urinary tract calcification or dilatation. Stomach/Bowel: Increased stool in rectum and in proximal half of colon. Appendix surgically absent by history. Prior gastric resection with moderate  to large hiatal hernia. Small bowel gas pattern normal. No evidence of bowel wall thickening or obstruction. Vascular/Lymphatic: Atherosclerotic calcifications aorta and iliac arteries. Aorta normal caliber. No adenopathy. Reproductive: Uterus surgically absent. Nonvisualization of ovaries. Other: No free air or free fluid. No hernia or inflammatory process. Musculoskeletal: Bones demineralized. IMPRESSION: Increased stool in rectum and proximal half of colon. Prior gastric resection with moderate to large hiatal hernia. Fatty infiltration of liver. New cortical scar at upper pole RIGHT kidney. No acute intra-abdominal or intrapelvic abnormalities. Aortic Atherosclerosis (ICD10-I70.0). Electronically Signed   By: MElta Guadeloupe  Thornton Papas M.D.   On: 11/26/2021 15:10   DG Chest Portable 1 View  Result Date: 11/23/2021 CLINICAL DATA:  Sob weak EXAM: PORTABLE CHEST - 1 VIEW COMPARISON:  09/08/2021 FINDINGS: Relatively low lung volumes. Coarse chronic appearing peripheral interstitial markings. Perihilar opacity seen previously have improved. Heart size and mediastinal contours are within normal limits. Coronary stent. No effusion. Surgical clips in the upper abdomen.  Regional bones unremarkable. IMPRESSION: No acute cardiopulmonary disease. Electronically Signed   By: Lucrezia Europe M.D.   On: 12/08/2021 13:04    Microbiology Recent Results (from the past 240 hour(s))  SARS Coronavirus 2 by RT PCR (hospital order, performed in [facility] hospital lab) *cepheid single result test* Anterior Nasal Swab     Status: None   Collection Time: 11/22/2021 12:06 PM   Specimen: Anterior Nasal Swab  Result Value Ref Range Status   SARS Coronavirus 2 by RT PCR NEGATIVE NEGATIVE Final    Comment: (NOTE) SARS-CoV-2 target nucleic acids are NOT DETECTED.  The SARS-CoV-2 RNA is generally detectable in upper and lower respiratory specimens during the acute phase of infection. The lowest concentration of SARS-CoV-2 viral copies this assay  can detect is 250 copies / mL. A negative result does not preclude SARS-CoV-2 infection and should not be used as the sole basis for treatment or other patient management decisions.  A negative result may occur with improper specimen collection / handling, submission of specimen other than nasopharyngeal swab, presence of viral mutation(s) within the areas targeted by this assay, and inadequate number of viral copies (<250 copies / mL). A negative result must be combined with clinical observations, patient history, and epidemiological information.  Fact Sheet for Patients:   https://www.patel.info/  Fact Sheet for Healthcare Providers: https://hall.com/  This test is not yet approved or  cleared by the [place] FDA and has been authorized for detection and/or diagnosis of SARS-CoV-2 by FDA under an Emergency Use Authorization (EUA).  This EUA will remain in effect (meaning this test can be used) for the duration of the COVID-19 declaration under Section 564(b)(1) of the Act, 21 U.S.C. section 360bbb-3(b)(1), unless the authorization is terminated or revoked sooner.  Performed at [facility], [place] [address], [place], [place] [address]   Culture, blood (Routine X 2) w Reflex to ID Panel     Status: None (Preliminary result)   Collection Time: 11/16/21  1:55 PM   Specimen: BLOOD  Result Value Ref Range Status   Specimen Description BLOOD RIGHT ANTECUBITAL  Final   Special Requests IN PEDIATRIC BOTTLE Blood Culture adequate volume  Final   Culture   Final    NO GROWTH < 24 HOURS Performed at [facility], [place] [address], [place], [place] [address]    Report Status PENDING  Incomplete  Culture, blood (Routine X 2) w Reflex to ID Panel     Status: None (Preliminary result)   Collection Time: 11/16/21  1:55 PM   Specimen: BLOOD  Result Value Ref Range Status   Specimen Description BLOOD RIGHT ANTECUBITAL  Final   Special  Requests   Final    IN PEDIATRIC BOTTLE Blood Culture results may not be optimal due to an inadequate volume of blood received in culture bottles   Culture   Final    NO GROWTH < 24 HOURS Performed at [facility], [place] [address] [place], [place] [address]    Report Status PENDING  Incomplete  MRSA Next Gen by PCR,  Nasal     Status: None   Collection Time: 11/16/21  4:58 PM   Specimen: Nasal Mucosa; Nasal Swab  Result Value Ref Range Status   MRSA by PCR Next Gen NOT DETECTED NOT DETECTED Final    Comment: (NOTE) The GeneXpert MRSA Assay (FDA approved for NASAL specimens only), is one component of a comprehensive MRSA colonization surveillance program. It is not intended to diagnose MRSA infection nor to guide or monitor treatment for MRSA infections. Test performance is not FDA approved in patients less than 68 years old. Performed at [facility], [place] [address] [place], [place] [address]     Lab Basic Metabolic Panel: Recent Labs  Lab 11/13/21 0334 11/14/21 0406 11/15/21 0548 11/16/21 0552 11/16/21 2300 12/13/21 0042 13-Dec-2021 0330  NA 136 138 140 139 137 136 135  K 3.1* 4.4 4.4 3.0* 4.5 4.5 4.2  CL 112* 113* 116* 115* 112*  --  105  CO2 17* 18* 14* 14* 14*  --  14*  GLUCOSE 103* 86 113* 117* 165*  --  136*  BUN 6* 5* 5* 9 10  --  10  CREATININE 0.50 0.59 0.57 0.68 0.80  --  0.76  CALCIUM 7.4* 7.9* 7.8* 7.9* 7.6*  --  7.4*  MG 2.1 1.7 1.6* 2.1  --   --  1.6*  PHOS 2.0* 3.3 2.8 2.1*  --   --  3.2   Liver Function Tests: Recent Labs  Lab 11/14/21 0406 11/15/21 0548 11/16/21 0552 11/16/21 2300 13-Dec-2021 0330  AST 116* 72* 53* 193* 256*  ALT 39 29 29 59* 65*  ALKPHOS 46 48 48 48 46  BILITOT 0.8 0.5 0.7 1.3* 1.5*  PROT 4.7* 3.7* 3.8* 4.0* 4.1*  ALBUMIN 2.1* 1.6* <1.5* 2.3* 2.7*   Recent Labs  Lab 11/22/2021 1500  LIPASE 22   No results for input(s): "AMMONIA" in the last 168 hours. CBC: Recent Labs  Lab 11/13/21 0334 11/14/21 0406  11/16/21 0552 2021/12/13 0042 12/13/21 0330 12-13-21 0430  WBC 5.9 7.8 11.0*  --  1.4* 0.8*  NEUTROABS 3.7 5.4 8.2*  --  1.0* 0.5*  HGB 11.3* 12.8 11.1* 9.9* 10.1* 10.5*  HCT 33.7* 37.9 34.0* 29.0* 29.7* 30.1*  MCV 98.3 97.4 98.6  --  97.7 95.6  PLT 177 209 127*  --  75* 60*   Cardiac Enzymes: No results for input(s): "CKTOTAL", "CKMB", "CKMBINDEX", "TROPONINI" in the last 168 hours. Sepsis Labs: Recent Labs  Lab 11/14/21 0406 11/16/21 0552 11/16/21 1705 11/16/21 2016 11/16/21 2300 2021/12/13 0200 12/13/2021 0330 12-13-2021 0430  WBC 7.8 11.0*  --   --   --   --  1.4* 0.8*  LATICACIDVEN  --   --  7.8* 9.0* 8.3* 8.8*  --   --       Candee Furbish 2021/12/13, 8:07 AM

## 2021-12-11 NOTE — Progress Notes (Signed)
Morphine started at 0754. Once patient's family in room, pressors turned off. Death confirmed with Thersa Salt at [phone]. [facility] notified. Family at bedside at time of death. Belongings given to family at bedside. Funeral home information gathered - please see post mortem flowsheet.

## 2021-12-11 NOTE — Progress Notes (Signed)
NAME:  [name] [name], MRN:  [id number], DOB:  Sep 12, 1950, LOS: 5 ADMISSION DATE:  11/12/2021, CONSULTATION DATE:  9/6 REFERRING MD:  Zigmund Daniel, CHIEF COMPLAINT:  SOB   History of Present Illness:  71 year old woman with hx of HIV on ART (CD4 231 09/09/21, viral load 140 11/14/21), IPF on 2L HOT, asthma p/w N/V x 5 days on 11/15/2021.  Admitted initially for obs, imaging showed constipation and she was disimpacted in ER.  Changed to inpatient as she was unable to take PO.  Was being worked up for placement due to ambulatory failure when she began having worsening SOB.  Bps remain soft but she is on chronic midodrine for this. PCCM consulted for worsening respiratory issues and acute on chronic hypotension.  Pertinent  Medical History  idiopathic pulmonary fibrosis (biopsy proven 2010) on pirfenidone (Oct 2016-now) asthma, HFpEF, well-controlled HIV (CD4 220 07/2020), dysphagia 2/2 esophageal strictures/moderate hiatal hernia/Schatzki ring and CAD s/p PCI, prediabetes, osteoporosis   Significant Hospital Events: Including procedures, antibiotic start and stop dates in addition to other pertinent events   9/1 admit 9/6 PCCM consult 9/6 started on levo and vaso for persistent hypotension; also on BiPAP for hypoxic respiratory failure 9/7 remains hypotensive despite pressors; hypoxic on BiPAP; transition to comfort care  Interim History / Subjective:  Overnight patient had worsening hypotension and hypoxemia on vasopressors. Per patient's daughter, the patient would not want resuscitation or intubation, code status changed to DNR/DNI. Patient was previously alert and oriented, now is somnolent and not following any commands.   Objective   Blood pressure (!) 79/68, pulse (!) 133, temperature (!) 93 F (33.9 C), temperature source Axillary, resp. rate (!) 22, height '4\' 11"'$  (1.499 m), weight 63 kg, SpO2 90 %.    Vent Mode: PCV;BIPAP FiO2 (%):  [100 %] 100 % Set Rate:  [15 bmp] 15 bmp   Intake/Output  Summary (Last 24 hours) at Dec 13, 2021 0611 Last data filed at Dec 13, 2021 0600 Gross per 24 hour  Intake 6418.94 ml  Output --  Net 6418.94 ml   Filed Weights   12/08/2021 1536  Weight: 63 kg    Examination: General: acutely ill appearing female, on BiPAP HENT: [place]/AT, eyes anicteric Lungs: Severe rhonchi in lung fields, on BiPAP Cardiovascular: Tachycardic rate, regular rhythm. No murmurs Abdomen: Soft, nontender, nondistended, +BS Extremities: 3+ pitting edema bilateral lower extremities; edema in bilateral upper extremities Neuro: Somnolent, not following commands  Resolved Hospital Problem list     Assessment & Plan:  Septic shock likely secondary to UTI Acute on chronic hypoxic respiratory failure ?Bilateral multifocal pneumonia Idiopathic pulmonary fibrosis Metabolic acidosis HIV Patient remains hypotensive despite addition of pressor support and remains hypoxic despite BiPAP. She is DNR/DNI and continues to decline. Transition to comfort care - Morphine gtt  - Stop pressors - In hospital death suspect  Best Practice (right click and "Reselect all SmartList Selections" daily)   Diet/type: NPO DVT prophylaxis: LMWH GI prophylaxis: PPI Lines: N/A R  Foley:  Yes, and it is no longer needed Code Status:  DNR Last date of multidisciplinary goals of care discussion [family updated 9/7]  Labs   CBC: Recent Labs  Lab 11/13/21 0334 11/14/21 0406 11/16/21 0552 December 13, 2021 0042 13-Dec-2021 0330 13-Dec-2021 0430  WBC 5.9 7.8 11.0*  --  1.4* 0.8*  NEUTROABS 3.7 5.4 8.2*  --  1.0* 0.5*  HGB 11.3* 12.8 11.1* 9.9* 10.1* 10.5*  HCT 33.7* 37.9 34.0* 29.0* 29.7* 30.1*  MCV 98.3 97.4 98.6  --  97.7 95.6  PLT 177 209 127*  --  75* 60*    Basic Metabolic Panel: Recent Labs  Lab 11/13/21 0334 11/14/21 0406 11/15/21 0548 11/16/21 0552 11/16/21 2300 06-Dec-2021 0042 2021-12-06 0330  NA 136 138 140 139 137 136 135  K 3.1* 4.4 4.4 3.0* 4.5 4.5 4.2  CL 112* 113* 116* 115* 112*  --   105  CO2 17* 18* 14* 14* 14*  --  14*  GLUCOSE 103* 86 113* 117* 165*  --  136*  BUN 6* 5* 5* 9 10  --  10  CREATININE 0.50 0.59 0.57 0.68 0.80  --  0.76  CALCIUM 7.4* 7.9* 7.8* 7.9* 7.6*  --  7.4*  MG 2.1 1.7 1.6* 2.1  --   --  1.6*  PHOS 2.0* 3.3 2.8 2.1*  --   --  3.2   GFR: Estimated Creatinine Clearance: 52.8 mL/min (by C-G formula based on SCr of 0.76 mg/dL). Recent Labs  Lab 11/14/21 0406 11/16/21 0552 11/16/21 1705 11/16/21 2016 11/16/21 2300 12/06/21 0200 12-06-2021 0330 12/06/21 0430  WBC 7.8 11.0*  --   --   --   --  1.4* 0.8*  LATICACIDVEN  --   --  7.8* 9.0* 8.3* 8.8*  --   --     Liver Function Tests: Recent Labs  Lab 11/14/21 0406 11/15/21 0548 11/16/21 0552 11/16/21 2300 12/06/21 0330  AST 116* 72* 53* 193* 256*  ALT 39 29 29 59* 65*  ALKPHOS 46 48 48 48 46  BILITOT 0.8 0.5 0.7 1.3* 1.5*  PROT 4.7* 3.7* 3.8* 4.0* 4.1*  ALBUMIN 2.1* 1.6* <1.5* 2.3* 2.7*   Recent Labs  Lab 12/03/2021 1500  LIPASE 22   No results for input(s): "AMMONIA" in the last 168 hours.  ABG    Component Value Date/Time   PHART 7.326 (L) 2021/12/06 0042   PCO2ART 24.9 (L) 2021-12-06 0042   PO2ART 78 (L) 12/06/21 0042   HCO3 13.1 (L) 06-Dec-2021 0042   TCO2 14 (L) 12/06/21 0042   ACIDBASEDEF 12.0 (H) 12/06/21 0042   O2SAT 95 12/06/21 0042     Coagulation Profile: No results for input(s): "INR", "PROTIME" in the last 168 hours.  Cardiac Enzymes: No results for input(s): "CKTOTAL", "CKMB", "CKMBINDEX", "TROPONINI" in the last 168 hours.  HbA1C: Hgb A1c MFr Bld  Date/Time Value Ref Range Status  09/09/2021 06:00 AM 5.1 4.8 - 5.6 % Final    Comment:    (NOTE)         Prediabetes: 5.7 - 6.4         Diabetes: >6.4         Glycemic control for adults with diabetes: <7.0     CBG: Recent Labs  Lab 11/16/21 1509 11/16/21 1609 11/16/21 1914 11/16/21 2305 December 06, 2021 0338  GLUCAP 91 78 81 163* 129*    Past Medical History:  She,  has a past medical history  of Asthma, Asthma exacerbation (04/21/2018), Bradycardia, CAD (coronary artery disease), Closed fracture of sacrum ([place]) (12/23/2010), Colon polyps, Dyslipidemia, GERD (gastroesophageal reflux disease), H/O: hysterectomy, Hiatal hernia, HIV positive ([place]), Hypertension, Influenza due to identified novel influenza A virus with other respiratory manifestations (04/22/2018), Palpitations, Peptic ulcer disease, Pulmonary fibrosis ([place]), Shortness of breath, and Weight loss.   Surgical History:   Past Surgical History:  Procedure Laterality Date   ABDOMINAL HYSTERECTOMY     APPENDECTOMY     CARDIAC CATHETERIZATION N/A 03/21/2016   Procedure: Right/Left Heart Cath and Coronary Angiography;  Surgeon:  Belva Crome, MD;  Location: [facility];  Service: Cardiovascular;  Laterality: N/A;   CHOLECYSTECTOMY     CORONARY STENT PLACEMENT  12/2017   GASTRECTOMY     HC OCCIPITAL NERVE BLOCK  08/01/2021   VENTRAL HERNIA REPAIR       Social History:   reports that she quit smoking about 25 years ago. Her smoking use included cigarettes. She has never used smokeless tobacco. She reports that she does not drink alcohol and does not use drugs.   Family History:  Her family history includes Bradycardia in her brother and sister; Coronary artery disease in an other family member; Diabetes in her mother; Heart attack in her mother; Heart disease in her mother; Hypertension in her mother; Stroke in her mother.   Allergies Allergies  Allergen Reactions   Isosorbide Nitrate Other (See Comments)    Headaches   Bactrim [Sulfamethoxazole-Trimethoprim] Rash   Sulfamethoxazole-Trimethoprim Hives and Rash     Home Medications  Prior to Admission medications   Medication Sig Start Date End Date Taking? Authorizing Provider  acetaminophen (TYLENOL) 500 MG tablet Take 1,000 mg by mouth every 6 (six) hours as needed for mild pain.   Yes [provider]  albuterol (VENTOLIN HFA) 108 (90 Base) MCG/ACT  inhaler Inhale 1-2 puffs into the lungs every 6 (six) hours as needed for shortness of breath. 08/21/21  Yes [provider]  aspirin EC 81 MG tablet Take 1 tablet (81 mg total) by mouth daily. 03/17/16  Yes Dorothy Spark, MD  atorvastatin (LIPITOR) 80 MG tablet Take 80 mg by mouth every evening. 08/17/21  Yes [provider]  BREO ELLIPTA 100-25 MCG/ACT AEPB Inhale 1 puff into the lungs daily. 08/21/21  Yes [provider]  budesonide-formoterol (SYMBICORT) 160-4.5 MCG/ACT inhaler Inhale 2 puffs into the lungs 2 (two) times daily.    Yes [provider]  clopidogrel (PLAVIX) 75 MG tablet Take 75 mg by mouth daily. 08/21/21  Yes [provider]  conjugated estrogens (PREMARIN) vaginal cream Place 0.81 Applicatorfuls vaginally every Wednesday. Pt only to use 1/4 of the applicator    Yes [provider]  [employer] D3 25 MCG (1000 UT) capsule Take 1,000 Units by mouth daily. 04/07/21  Yes [provider]  diclofenac Sodium (VOLTAREN) 1 % GEL Apply 2-3 g topically 3 (three) times daily as needed for pain. 07/29/21  Yes [provider]  docusate sodium (COLACE) 100 MG capsule Take 1 capsule (100 mg total) by mouth 2 (two) times daily. 09/11/21  Yes Donne Hazel, MD  DOVATO 50-300 MG tablet Take 1 tablet by mouth daily. 08/15/21  Yes [provider]  estradiol (ESTRACE) 0.1 MG/GM vaginal cream Place 1 Applicatorful vaginally 3 (three) times a week. 10/24/21  Yes [provider]  furosemide (LASIX) 20 MG tablet Take 20 mg by mouth daily as needed for edema. 07/20/21  Yes [provider]  gabapentin (NEURONTIN) 300 MG capsule Take 1 capsule (300 mg total) by mouth 2 (two) times daily. 09/11/21 11/12/21 Yes Donne Hazel, MD  lidocaine (LIDODERM) 5 % Place 1 patch onto the skin daily. 07/28/21  Yes [provider]  methocarbamol (ROBAXIN) 500 MG tablet Take 500 mg by mouth every 6 (six) hours as needed for muscle  spasms. 08/21/21  Yes [provider]  metoprolol succinate (TOPROL-XL) 25 MG 24 hr tablet Take 12.5 mg by mouth daily. 08/21/21  Yes [provider]  midodrine (PROAMATINE) 10 MG tablet  Take 1 tablet (10 mg total) by mouth 2 (two) times daily with a meal. 06/17/21  Yes Domenic Polite, MD  nitroGLYCERIN (NITROSTAT) 0.4 MG SL tablet Place 0.4 mg under the tongue every 5 (five) minutes as needed for chest pain. 07/20/21  Yes [provider]  ondansetron (ZOFRAN) 4 MG tablet Take 4 mg by mouth every 8 (eight) hours as needed for nausea/vomiting. 08/21/21  Yes [provider]  pantoprazole (PROTONIX) 40 MG tablet Take 40 mg by mouth daily. 08/23/21  Yes [provider]  polyethylene glycol (MIRALAX / GLYCOLAX) 17 g packet Take 17 g by mouth 2 (two) times daily. 07/20/21  Yes Patrecia Pour, MD  ranolazine (RANEXA) 500 MG 12 hr tablet Take 500 mg by mouth 2 (two) times daily. 08/17/21  Yes [provider]  traMADol (ULTRAM) 50 MG tablet Take 1 tablet (50 mg total) by mouth every 6 (six) hours as needed for moderate pain. 09/11/21  Yes Donne Hazel, MD  traZODone (DESYREL) 50 MG tablet Take 50 mg by mouth at bedtime. 07/25/21  Yes [provider]  trospium (SANCTURA) 20 MG tablet Take 20 mg by mouth 2 (two) times daily. 07/20/21  Yes [provider]  prochlorperazine (COMPAZINE) 5 MG tablet Take 1 tablet (5 mg total) by mouth every 6 (six) hours as needed for nausea or vomiting. Patient not taking: Reported on 11/12/2021 09/11/21   Donne Hazel, MD  senna-docusate (SENOKOT-S) 8.6-50 MG tablet Take 1 tablet by mouth 2 (two) times daily. Patient not taking: Reported on 08/01/2021 07/20/21   Patrecia Pour, MD     Critical care time: 30 mins

## 2021-12-11 DEATH — deceased
